# Patient Record
Sex: Male | Born: 1975 | Race: White | Hispanic: No | Marital: Married | Smoking: Former smoker
Health system: Southern US, Community
[De-identification: ages and names within clinical notes are randomized; demographics above are authoritative.]

## PROBLEM LIST (undated history)

## (undated) DIAGNOSIS — I10 Essential (primary) hypertension: Secondary | ICD-10-CM

## (undated) DIAGNOSIS — C8590 Non-Hodgkin lymphoma, unspecified, unspecified site: Secondary | ICD-10-CM

## (undated) DIAGNOSIS — F1410 Cocaine abuse, uncomplicated: Secondary | ICD-10-CM

## (undated) DIAGNOSIS — E039 Hypothyroidism, unspecified: Secondary | ICD-10-CM

## (undated) DIAGNOSIS — G8929 Other chronic pain: Secondary | ICD-10-CM

## (undated) DIAGNOSIS — J42 Unspecified chronic bronchitis: Secondary | ICD-10-CM

## (undated) DIAGNOSIS — G4730 Sleep apnea, unspecified: Secondary | ICD-10-CM

## (undated) DIAGNOSIS — R519 Headache, unspecified: Secondary | ICD-10-CM

## (undated) DIAGNOSIS — M545 Low back pain, unspecified: Secondary | ICD-10-CM

## (undated) DIAGNOSIS — I509 Heart failure, unspecified: Secondary | ICD-10-CM

## (undated) DIAGNOSIS — J189 Pneumonia, unspecified organism: Secondary | ICD-10-CM

## (undated) DIAGNOSIS — R51 Headache: Secondary | ICD-10-CM

## (undated) DIAGNOSIS — J45909 Unspecified asthma, uncomplicated: Secondary | ICD-10-CM

## (undated) DIAGNOSIS — Z9289 Personal history of other medical treatment: Secondary | ICD-10-CM

## (undated) DIAGNOSIS — J449 Chronic obstructive pulmonary disease, unspecified: Secondary | ICD-10-CM

## (undated) DIAGNOSIS — N179 Acute kidney failure, unspecified: Secondary | ICD-10-CM

## (undated) DIAGNOSIS — K219 Gastro-esophageal reflux disease without esophagitis: Secondary | ICD-10-CM

## (undated) HISTORY — PX: WISDOM TOOTH EXTRACTION: SHX21

## (undated) HISTORY — DX: Acute kidney failure, unspecified: N17.9

## (undated) HISTORY — DX: Chronic obstructive pulmonary disease, unspecified: J44.9

## (undated) HISTORY — PX: HERNIA REPAIR: SHX51

## (undated) HISTORY — DX: Sleep apnea, unspecified: G47.30

---

## 2002-03-24 DIAGNOSIS — C8590 Non-Hodgkin lymphoma, unspecified, unspecified site: Secondary | ICD-10-CM

## 2002-03-24 HISTORY — DX: Non-Hodgkin lymphoma, unspecified, unspecified site: C85.90

## 2002-03-24 HISTORY — PX: OTHER SURGICAL HISTORY: SHX169

## 2015-10-08 ENCOUNTER — Encounter (HOSPITAL_COMMUNITY): Payer: Self-pay | Admitting: Emergency Medicine

## 2015-10-08 ENCOUNTER — Emergency Department (HOSPITAL_COMMUNITY): Payer: Self-pay

## 2015-10-08 ENCOUNTER — Inpatient Hospital Stay (HOSPITAL_COMMUNITY)
Admission: EM | Admit: 2015-10-08 | Discharge: 2015-10-13 | DRG: 193 | Disposition: A | Discharge status: Home / Self Care | Payer: Self-pay | Attending: Internal Medicine | Admitting: Internal Medicine

## 2015-10-08 DIAGNOSIS — J189 Pneumonia, unspecified organism: Principal | ICD-10-CM | POA: Diagnosis present

## 2015-10-08 DIAGNOSIS — K219 Gastro-esophageal reflux disease without esophagitis: Secondary | ICD-10-CM | POA: Diagnosis present

## 2015-10-08 DIAGNOSIS — J45901 Unspecified asthma with (acute) exacerbation: Secondary | ICD-10-CM | POA: Diagnosis present

## 2015-10-08 DIAGNOSIS — R079 Chest pain, unspecified: Secondary | ICD-10-CM

## 2015-10-08 DIAGNOSIS — Z8571 Personal history of Hodgkin lymphoma: Secondary | ICD-10-CM

## 2015-10-08 DIAGNOSIS — I10 Essential (primary) hypertension: Secondary | ICD-10-CM | POA: Diagnosis present

## 2015-10-08 DIAGNOSIS — Z801 Family history of malignant neoplasm of trachea, bronchus and lung: Secondary | ICD-10-CM

## 2015-10-08 DIAGNOSIS — R0902 Hypoxemia: Secondary | ICD-10-CM

## 2015-10-08 DIAGNOSIS — R9389 Abnormal findings on diagnostic imaging of other specified body structures: Secondary | ICD-10-CM | POA: Diagnosis present

## 2015-10-08 DIAGNOSIS — E669 Obesity, unspecified: Secondary | ICD-10-CM | POA: Diagnosis present

## 2015-10-08 DIAGNOSIS — J9601 Acute respiratory failure with hypoxia: Secondary | ICD-10-CM | POA: Diagnosis present

## 2015-10-08 DIAGNOSIS — Z923 Personal history of irradiation: Secondary | ICD-10-CM

## 2015-10-08 DIAGNOSIS — Z6836 Body mass index (BMI) 36.0-36.9, adult: Secondary | ICD-10-CM

## 2015-10-08 DIAGNOSIS — J690 Pneumonitis due to inhalation of food and vomit: Secondary | ICD-10-CM | POA: Diagnosis present

## 2015-10-08 HISTORY — DX: Unspecified asthma, uncomplicated: J45.909

## 2015-10-08 HISTORY — DX: Essential (primary) hypertension: I10

## 2015-10-08 MED ORDER — ALBUTEROL SULFATE (2.5 MG/3ML) 0.083% IN NEBU
5.0000 mg | INHALATION_SOLUTION | Freq: Once | RESPIRATORY_TRACT | Status: AC
  Administered 2015-10-08: 5 mg via RESPIRATORY_TRACT
  Filled 2015-10-08: qty 6

## 2015-10-08 NOTE — ED Notes (Signed)
Pt states that he has had SOB and trouble breathing tonight that is not relieved by inhaler. States he has had sinus congestion but worsened tonight. Alert and oriented. 84% RA.

## 2015-10-09 ENCOUNTER — Emergency Department (HOSPITAL_COMMUNITY): Payer: Self-pay

## 2015-10-09 ENCOUNTER — Encounter (HOSPITAL_COMMUNITY): Payer: Self-pay | Admitting: Nurse Practitioner

## 2015-10-09 DIAGNOSIS — J45909 Unspecified asthma, uncomplicated: Secondary | ICD-10-CM

## 2015-10-09 DIAGNOSIS — R9389 Abnormal findings on diagnostic imaging of other specified body structures: Secondary | ICD-10-CM | POA: Diagnosis present

## 2015-10-09 DIAGNOSIS — J690 Pneumonitis due to inhalation of food and vomit: Secondary | ICD-10-CM | POA: Diagnosis present

## 2015-10-09 DIAGNOSIS — J189 Pneumonia, unspecified organism: Secondary | ICD-10-CM | POA: Diagnosis present

## 2015-10-09 DIAGNOSIS — I10 Essential (primary) hypertension: Secondary | ICD-10-CM

## 2015-10-09 DIAGNOSIS — J45901 Unspecified asthma with (acute) exacerbation: Secondary | ICD-10-CM | POA: Insufficient documentation

## 2015-10-09 DIAGNOSIS — J441 Chronic obstructive pulmonary disease with (acute) exacerbation: Secondary | ICD-10-CM | POA: Insufficient documentation

## 2015-10-09 LAB — CBC WITH DIFFERENTIAL/PLATELET
Basophils Absolute: 0 10*3/uL (ref 0.0–0.1)
Basophils Relative: 0 %
Eosinophils Absolute: 0 10*3/uL (ref 0.0–0.7)
Eosinophils Relative: 0 %
HEMATOCRIT: 39.9 % (ref 39.0–52.0)
HEMOGLOBIN: 12.8 g/dL — AB (ref 13.0–17.0)
LYMPHS ABS: 1.4 10*3/uL (ref 0.7–4.0)
Lymphocytes Relative: 18 %
MCH: 27.7 pg (ref 26.0–34.0)
MCHC: 32.1 g/dL (ref 30.0–36.0)
MCV: 86.4 fL (ref 78.0–100.0)
MONOS PCT: 5 %
Monocytes Absolute: 0.4 10*3/uL (ref 0.1–1.0)
NEUTROS ABS: 6 10*3/uL (ref 1.7–7.7)
NEUTROS PCT: 77 %
Platelets: 335 10*3/uL (ref 150–400)
RBC: 4.62 MIL/uL (ref 4.22–5.81)
RDW: 15.7 % — ABNORMAL HIGH (ref 11.5–15.5)
WBC: 7.8 10*3/uL (ref 4.0–10.5)

## 2015-10-09 LAB — ETHANOL

## 2015-10-09 LAB — BASIC METABOLIC PANEL
Anion gap: 9 (ref 5–15)
BUN: 18 mg/dL (ref 6–20)
CHLORIDE: 105 mmol/L (ref 101–111)
CO2: 26 mmol/L (ref 22–32)
Calcium: 9.5 mg/dL (ref 8.9–10.3)
Creatinine, Ser: 0.97 mg/dL (ref 0.61–1.24)
GFR calc Af Amer: >60 mL/min (ref >60)
GFR calc non Af Amer: >60 mL/min (ref >60)
GLUCOSE: 152 mg/dL — AB (ref 65–99)
POTASSIUM: 3.5 mmol/L (ref 3.5–5.1)
Sodium: 140 mmol/L (ref 135–145)

## 2015-10-09 LAB — TROPONIN I: Troponin I: <0.03 ng/mL (ref <0.031)

## 2015-10-09 LAB — LACTATE DEHYDROGENASE: LDH: 192 U/L (ref 98–192)

## 2015-10-09 LAB — PROCALCITONIN

## 2015-10-09 MED ORDER — DEXTROSE 5 % IV SOLN
1.0000 g | INTRAVENOUS | Status: DC
  Administered 2015-10-09 – 2015-10-13 (×5): 1 g via INTRAVENOUS
  Filled 2015-10-09 (×5): qty 10

## 2015-10-09 MED ORDER — HYDROCODONE-ACETAMINOPHEN 5-325 MG PO TABS
1.0000 | ORAL_TABLET | ORAL | Status: DC | PRN
  Administered 2015-10-09 – 2015-10-13 (×7): 2 via ORAL
  Filled 2015-10-09 (×2): qty 2
  Filled 2015-10-09: qty 1
  Filled 2015-10-09: qty 2
  Filled 2015-10-09: qty 1
  Filled 2015-10-09 (×3): qty 2

## 2015-10-09 MED ORDER — GABAPENTIN 400 MG PO CAPS
800.0000 mg | ORAL_CAPSULE | Freq: Three times a day (TID) | ORAL | Status: DC
  Administered 2015-10-09 – 2015-10-13 (×12): 800 mg via ORAL
  Filled 2015-10-09 (×13): qty 2

## 2015-10-09 MED ORDER — DEXTROSE 5 % IV SOLN
500.0000 mg | INTRAVENOUS | Status: DC
  Administered 2015-10-09 – 2015-10-13 (×5): 500 mg via INTRAVENOUS
  Filled 2015-10-09 (×5): qty 500

## 2015-10-09 MED ORDER — IPRATROPIUM-ALBUTEROL 0.5-2.5 (3) MG/3ML IN SOLN: 3.0000 mL | RESPIRATORY_TRACT | Status: DC | PRN

## 2015-10-09 MED ORDER — IPRATROPIUM BROMIDE 0.02 % IN SOLN
0.5000 mg | Freq: Once | RESPIRATORY_TRACT | Status: AC
  Administered 2015-10-09: 0.5 mg via RESPIRATORY_TRACT
  Filled 2015-10-09: qty 2.5

## 2015-10-09 MED ORDER — ALBUTEROL SULFATE (2.5 MG/3ML) 0.083% IN NEBU
5.0000 mg | INHALATION_SOLUTION | Freq: Once | RESPIRATORY_TRACT | Status: AC
  Administered 2015-10-09: 5 mg via RESPIRATORY_TRACT
  Filled 2015-10-09: qty 6

## 2015-10-09 MED ORDER — AMOXICILLIN 500 MG PO CAPS
1000.0000 mg | ORAL_CAPSULE | Freq: Once | ORAL | Status: AC
  Administered 2015-10-09: 1000 mg via ORAL
  Filled 2015-10-09: qty 2

## 2015-10-09 MED ORDER — AMLODIPINE BESYLATE 10 MG PO TABS
10.0000 mg | ORAL_TABLET | Freq: Every day | ORAL | Status: DC
  Administered 2015-10-09 – 2015-10-13 (×5): 10 mg via ORAL
  Filled 2015-10-09 (×6): qty 1

## 2015-10-09 MED ORDER — PREDNISONE 20 MG PO TABS
60.0000 mg | ORAL_TABLET | Freq: Once | ORAL | Status: AC
Start: 2015-10-09 — End: 2015-10-09
  Administered 2015-10-09: 60 mg via ORAL
  Filled 2015-10-09: qty 3

## 2015-10-09 MED ORDER — ALBUTEROL SULFATE (2.5 MG/3ML) 0.083% IN NEBU
2.5000 mg | INHALATION_SOLUTION | Freq: Three times a day (TID) | RESPIRATORY_TRACT | Status: DC
  Administered 2015-10-09 – 2015-10-11 (×6): 2.5 mg via RESPIRATORY_TRACT
  Filled 2015-10-09 (×8): qty 3

## 2015-10-09 MED ORDER — GUAIFENESIN ER 600 MG PO TB12
600.0000 mg | ORAL_TABLET | Freq: Two times a day (BID) | ORAL | Status: DC
Start: 2015-10-09 — End: 2015-10-13
  Administered 2015-10-09 – 2015-10-13 (×9): 600 mg via ORAL
  Filled 2015-10-09 (×9): qty 1

## 2015-10-09 MED ORDER — ACETYLCYSTEINE 20 % IN SOLN
3.0000 mL | Freq: Three times a day (TID) | RESPIRATORY_TRACT | Status: DC
  Administered 2015-10-09: 3 mL via RESPIRATORY_TRACT
  Filled 2015-10-09 (×4): qty 4

## 2015-10-09 MED ORDER — METHYLPREDNISOLONE SODIUM SUCC 40 MG IJ SOLR
40.0000 mg | Freq: Two times a day (BID) | INTRAMUSCULAR | Status: AC
  Administered 2015-10-09 – 2015-10-10 (×3): 40 mg via INTRAVENOUS
  Filled 2015-10-09 (×4): qty 1

## 2015-10-09 MED ORDER — ALBUTEROL SULFATE (2.5 MG/3ML) 0.083% IN NEBU: 5.0000 mg | INHALATION_SOLUTION | RESPIRATORY_TRACT | Status: DC | PRN

## 2015-10-09 MED ORDER — IPRATROPIUM-ALBUTEROL 0.5-2.5 (3) MG/3ML IN SOLN
3.0000 mL | RESPIRATORY_TRACT | Status: DC | PRN
Start: 2015-10-09 — End: 2015-10-09

## 2015-10-09 MED ORDER — PANTOPRAZOLE SODIUM 40 MG PO TBEC
40.0000 mg | DELAYED_RELEASE_TABLET | Freq: Every day | ORAL | Status: DC
  Administered 2015-10-09 – 2015-10-13 (×5): 40 mg via ORAL
  Filled 2015-10-09 (×5): qty 1

## 2015-10-09 MED ORDER — PREDNISONE 50 MG PO TABS
60.0000 mg | ORAL_TABLET | Freq: Every day | ORAL | Status: DC
  Filled 2015-10-09: qty 1
  Filled 2015-10-09: qty 3

## 2015-10-09 MED ORDER — BUDESONIDE 0.5 MG/2ML IN SUSP
0.5000 mg | Freq: Two times a day (BID) | RESPIRATORY_TRACT | Status: DC
  Administered 2015-10-09 – 2015-10-11 (×4): 0.5 mg via RESPIRATORY_TRACT
  Filled 2015-10-09 (×6): qty 2

## 2015-10-09 MED ORDER — ENOXAPARIN SODIUM 40 MG/0.4ML ~~LOC~~ SOLN
40.0000 mg | SUBCUTANEOUS | Status: DC
  Administered 2015-10-09 – 2015-10-12 (×3): 40 mg via SUBCUTANEOUS
  Filled 2015-10-09 (×5): qty 0.4

## 2015-10-09 MED ORDER — HYDROCOD POLST-CPM POLST ER 10-8 MG/5ML PO SUER
5.0000 mL | Freq: Once | ORAL | Status: AC
  Administered 2015-10-09: 5 mL via ORAL
  Filled 2015-10-09: qty 5

## 2015-10-09 MED ORDER — SODIUM CHLORIDE 0.9 % IV BOLUS (SEPSIS)
1000.0000 mL | Freq: Once | INTRAVENOUS | Status: AC
  Administered 2015-10-09: 1000 mL via INTRAVENOUS

## 2015-10-09 NOTE — Progress Notes (Signed)
I have and examined [name] at bedside and reviewed his chart. Also consulted pulmonary. Appreciate help. Mkai Ferryman was admitted earlier this morning by Dr Tamala Julian. Please refer to his comprehensive H&P for details. Patient has left upper lobe pneumonia with question of left upper lobe collapse in setting of history of lymphoma in remission and recent incarceration. Question is whether he has a postobstructive process. Will follow pulmonary recommendations.

## 2015-10-09 NOTE — Consult Note (Signed)
Name: Yohan Neil MRN: [id number] DOB: November 18, 1975    ADMISSION DATE:  10/08/2015 CONSULTATION DATE:  10/09/15  REFERRING MD :  Sanjuana Letters  CHIEF COMPLAINT:  SOB   HISTORY OF PRESENT ILLNESS:  Angel Diagne is a 40 y.o. male with a PMH of asthma, HTN, GERD (feels controlled on medication), prior mediastinoscopy for diagnosis of Non-Hodgkins lymphoma (recurrence x2 - 2002-2005, 2013-2014, followed at [facility] previously by Dr. Yancey Flemings) in remission who presented to the  [facility] ED 02/15 for SOB that started 5 - 7 days prior.  Symptoms began with nasal congestion then dry cough and progressed into SOB with chest tightness.  He used inhalers but got no relief.  Symptoms exacerbation with exertion, relieved with rest.  Also reported subjective fevers, chills, and malaise.  Denied any headaches, true chest pain (besides chest tightness), N/V/D, abd pain, myalgias.  He is a former smoker - smoked approx 15 years, 1ppd and quit 10 years agot.  The patient was  released from prison 2 mos ago after a 10 year stay.  He had negative PPD, HIV tests prior to release.  He has been working while in prison and has continued to work post release.  Unfortunately, his insurance will not kick in for another month.  He denies ETOH, current smoking, illegal drugs.  Of note, he does report ongoing sinus issues, sinus pressure, post nasal gtt and wakes with a sore throat in am.   He denies any unintentional weight loss, hemoptysis & night sweats. Has not noticed any lymphadenopathy.   In ED, he was hypoxic to 84% on RA initially.  This improved to 93% on RA after 2 breathing treatments; however, he desaturated to 88% with ambulation.  CXR showed LUL atx vs scarring with possible underlying PNA.  CT scan suggestive of collapsed LUL with consolidation (obstructing lesion not ruled out).  PCCM was called for further recs.    PAST MEDICAL HISTORY :   has a past medical history of Lymphoma ([place]); Asthma; and Hypertension.  has past  surgical history that includes tumor biopsy and Wisdom tooth extraction.   Prior to Admission medications   Medication Sig Start Date End Date Taking? Authorizing Provider  amLODipine (NORVASC) 10 MG tablet Take 10 mg by mouth daily.   Yes Historical Provider, MD  gabapentin (NEURONTIN) 800 MG tablet Take 800 mg by mouth 3 (three) times daily.   Yes Historical Provider, MD  pantoprazole (PROTONIX) 40 MG tablet Take 40 mg by mouth daily.   Yes Historical Provider, MD  Phenylephrine-APAP-Guaifenesin (SINUS CONGESTION/PAIN SEVERE PO) Take 1 tablet by mouth every 6 (six) hours as needed (congestion).   Yes Historical Provider, MD  pseudoephedrine-guaifenesin (MUCINEX D) 60-600 MG 12 hr tablet Take 1 tablet by mouth every 12 (twelve) hours as needed for congestion.   Yes Historical Provider, MD   No Known Allergies  FAMILY HISTORY:  family history is not on file.  Parents are deceased. Father was killed in a MVA. Mother had lung CA.   SOCIAL HISTORY:  reports that he has never smoked. His smokeless tobacco use includes Snuff. He reports that he does not drink alcohol or use illicit drugs.  Engaged. Has 5 children. Used to work in Education officer, environmental. Works at [employer]. Denies smoking, ETOH, illicit drug use.  REVIEW OF SYSTEMS:   Gen: Denies weight change, fatigue, night sweats.  Reports subjective fevers / chills HEENT: Denies blurred vision, double vision, hearing loss, tinnitus, sinus congestion, rhinorrhea, sore throat, neck stiffness, dysphagia PULM:  Denies hemoptysis.  Reports shortness of breath, cough, occasional green sputum production, chest tightness and wheezing CV: Denies chest pain, edema, orthopnea, paroxysmal nocturnal dyspnea, palpitations GI: Denies abdominal pain, nausea, vomiting, diarrhea, hematochezia, melena, constipation, change in bowel habits GU: Denies dysuria, hematuria, polyuria, oliguria, urethral discharge Endocrine: Denies hot or cold intolerance, polyuria,  polyphagia or appetite change Derm: Denies rash, dry skin, scaling or peeling skin change Heme: Denies easy bruising, bleeding, bleeding gums Neuro: Denies headache, numbness, weakness, slurred speech, loss of memory or consciousness    SUBJECTIVE:   VITAL SIGNS: Temp:  [97.5 F (36.4 C)-97.9 F (36.6 C)] 97.9 F (36.6 C) (02/15 0152) Pulse Rate:  [77-120] 120 (02/15 1033) Resp:  [16-23] 21 (02/15 1033) BP: (135-168)/(76-115) 145/76 mmHg (02/15 1033) SpO2:  [84 %-96 %] 95 % (02/15 1033) Weight:  [122.471 kg (270 lb)] 122.471 kg (270 lb) (02/14 2207)  PHYSICAL EXAMINATION: General: WDWN adult male in NAD, sitting at bedside  Neuro: AAOx4, speech clear, MAE HEENT: MM pink/moist, no jvd, old mediastinoscopy scar  Cardiovascular: s1s2 rrr, no m/r/g  Lungs: even/non-labored, lungs bilaterally with few soft wheeze Abdomen: obese/soft, bsx4 active  Musculoskeletal: no acute deformities  Skin: warm/dry, no edema  Lymph:  No palpable lymph nodes in neck, chest or axilla     Recent Labs Lab 10/09/15 0445  NA 140  K 3.5  CL 105  CO2 26  BUN 18  CREATININE 0.97  GLUCOSE 152*    Recent Labs Lab 10/09/15 0445  HGB 12.8*  HCT 39.9  WBC 7.8  PLT 335   Dg Chest 2 View  10/08/2015  CLINICAL DATA:  Body ache, shortness of breath, fever and cough for 1 week. History of lymphoma, asthma and hypertension. EXAM: CHEST  2 VIEW COMPARISON:  None. FINDINGS: LEFT upper lobe atelectasis with air bronchograms. Strandy densities LEFT lung base. RIGHT lung is clear. No pleural effusion. No pneumothorax. Air distended proximal esophagus. Cardiac silhouette is normal. Soft tissue planes and included osseous structures are nonsuspicious. IMPRESSION: Apparent LEFT upper lobe atelectasis versus scarring, with superimposed possible pneumonia. Recommend CT chest to assess for endobronchial lesion. LEFT lung base atelectasis/scarring. Electronically Signed   By: Elon Alas M.D.   On: 10/08/2015  23:18   Ct Chest Wo Contrast  10/09/2015  CLINICAL DATA:  Shortness of breath tonight. Increased congestion. Low oxygen saturation. Possible endobronchial lesion on chest radiograph. History of hypertension and lymphoma. EXAM: CT CHEST WITHOUT CONTRAST TECHNIQUE: Multidetector CT imaging of the chest was performed following the standard protocol without IV contrast. COMPARISON:  Chest radiograph 10/08/2015 FINDINGS: Volume loss and consolidation in the left upper lung with air bronchograms. Left upper lung bronchus is patent but appears small. Changes are likely due to pneumonia but occult obstructing lesion is not excluded. Recommend follow-up imaging after resolution of acute process for further evaluation. Right lung is clear. Airways are patent. Normal heart size. Normal caliber thoracic aorta. No significant lymphadenopathy in the chest. Esophagus is decompressed. Included portions of the upper abdominal organs demonstrate a tiny low-attenuation lesion in the liver probably representing a cyst although too small to characterize. No destructive bone lesions. Calcification in the anterior mediastinum is of nonspecific etiology but appears benign. IMPRESSION: Collapse and consolidation of the left upper lung. This is likely to represent pneumonia but occult obstructing lesion is not excluded. Recommend followup after resolution of acute process. No mediastinal lymphadenopathy. Electronically Signed   By: Lucienne Capers M.D.   On: 10/09/2015 05:57  STUDIES:  CXR 02/15 > LUL atx vs scarring with possible superimposed PNA. CT chest 02/15 > collapse and consolidation of LUL, likely due to PNA but obstructing lesion not excluded.  SIGNIFICANT EVENTS  02/15 > admitted with SOB and probable CAP.  ASSESSMENT / PLAN:  Presumed CAP - CT with clear consolidation, volume loss, atx in LUL with noteable air bronchograms.  Favor PNA vs obstructing lesion, doubt TB but for completeness with hx   Plan: Agree  with CAP coverage (azithro / rocephin). Follow cultures.  Assess PCT. Pulmonary hygiene. Defer bronch for now. Add mucomyst TID with albuterol  Assess urine strep antigen  Assess quantiferon gold Follow CXR.   Likely will need repeat CT imaging in near future PPI QD Assess AFB x3 Assess UDS, ETOH Pulmicort BID    Acute hypoxic respiratory failure - due to CAP + possible asthma exac. Asthma with possible exacerbation. Former Tobacco Abuse   Plan: Continue supplemental O2 as needed to maintain SpO2 > 92%. Consider ambulatory desat study prior to d/c to assess home O2 needs. Continue BD's, steroids.  Rest per primary team.    Noe Gens, NP-C [facility] Pgr: [phone] or if no answer [phone] 10/09/2015, 1:17 PM   ATTENDING NOTE: I  have personally reviewed patient's available data, including medical history, events of note, physical examination and test results as part of my evaluation. I have discussed with NP Noe Gens.  I agree with the physical examination, assessment, and plan as outlined. Additional physical examination/comments/assessment/plan as written below.  Pt is a 66M, with history of asthma, Hodgkins lymphoma (in remission), recently released from incarceration, presents to ED with 2 week h/o cough, sputum production,fevers, chills, wheezing, SOB.  CXR with LUL infiltrate and atelectasis but can not R/O scarring from previous Radiation he got from Hodgkins Lymphoma.  Pt was CAP and asthma exacerbation. LUL infiltrate/atelectasis could be from radiation as well. Doubt PTB.   Plan: 1. As written by Noe Gens. 2. More importantly, we need to get records (Chest ct scan -- he had yearly until 2016) from [facility]. Will ask case manager to retrieve records.  3. Will switch to IV medrol for his asthma axacerbation. May switch to PO pred in 2-3 days if better. 4. As TB GOLD quantiferon has been ordered, I think it is imperative to place pt on TB  isolation until TB is ruled out.  5. Cont other meds.  6. DVT prophylaxis.  7. May need Symbicort or Dulera or Advair on D/C.    Elsie Saas A. Corrie Dandy, MD 10/09/2015, 5:39 PM [facility] Pager [phone] After 3 pm or if no answer, call [phone]

## 2015-10-09 NOTE — ED Notes (Signed)
Pt dropped to 88% ambulating from 96% on room air.

## 2015-10-09 NOTE — ED Provider Notes (Signed)
CSN: [id number]     Arrival date & time 10/08/15  2155 History   First MD Initiated Contact with Patient 10/09/15 0112     Chief Complaint  Patient presents with  . Shortness of Breath     (Consider location/radiation/quality/duration/timing/severity/associated sxs/prior Treatment) Patient is a 40 y.o. male presenting with shortness of breath. The history is provided by the patient and the spouse. No language interpreter was used.  Shortness of Breath Severity:  Severe Onset quality:  Gradual Duration:  1 day Timing:  Constant Progression:  Worsening Associated symptoms: cough and wheezing   Associated symptoms: no abdominal pain, no chest pain, no headaches, no neck pain and no rash   Associated symptoms comment:  Patient presents with complaint of sinus/nasal congestion for several days, dry cough, sore throat and swollen lymph nodes. He reports chills and sweating at home but did not take his temperature. No nausea or vomiting. He reports history of asthma with inhaler use at home but no relief with inhaler yesterday. He has chest tightness and SOB but no chest pain. He reports history of lymphoma in remission.   Past Medical History  Diagnosis Date  . Lymphoma ([place])     x 2  . Asthma   . Hypertension    No past surgical history on file. No family history on file. Social History  Substance Use Topics  . Smoking status: Never Smoker   . Smokeless tobacco: Not on file  . Alcohol Use: No    Review of Systems  Constitutional: Positive for chills.  HENT: Positive for congestion and trouble swallowing. Negative for voice change.   Respiratory: Positive for cough, chest tightness, shortness of breath and wheezing.   Cardiovascular: Negative for chest pain.  Gastrointestinal: Negative for nausea and abdominal pain.  Musculoskeletal: Negative for myalgias and neck pain.  Skin: Negative for rash.  Neurological: Negative for syncope, weakness and headaches.      Allergies   Review of patient's allergies indicates no known allergies.  Home Medications   Prior to Admission medications   Medication Sig Start Date End Date Taking? Authorizing Provider  amLODipine (NORVASC) 10 MG tablet Take 10 mg by mouth daily.   Yes Historical Provider, MD  gabapentin (NEURONTIN) 800 MG tablet Take 800 mg by mouth 3 (three) times daily.   Yes Historical Provider, MD  pantoprazole (PROTONIX) 40 MG tablet Take 40 mg by mouth daily.   Yes Historical Provider, MD  Phenylephrine-APAP-Guaifenesin (SINUS CONGESTION/PAIN SEVERE PO) Take 1 tablet by mouth every 6 (six) hours as needed (congestion).   Yes Historical Provider, MD  pseudoephedrine-guaifenesin (MUCINEX D) 60-600 MG 12 hr tablet Take 1 tablet by mouth every 12 (twelve) hours as needed for congestion.   Yes Historical Provider, MD   BP 168/101 mmHg  Pulse 118  Temp(Src) 97.5 F (36.4 C) (Oral)  Resp 22  Ht 6' (1.829 m)  Wt 122.471 kg  BMI 36.61 kg/m2  SpO2 90% Physical Exam  Constitutional: He appears well-developed and well-nourished. No distress.  HENT:  Head: Normocephalic.  Mouth/Throat: Uvula is midline and mucous membranes are normal. Posterior oropharyngeal erythema present. No oropharyngeal exudate or posterior oropharyngeal edema.  Eyes: Conjunctivae are normal.  Neck: Normal range of motion.  Cardiovascular: Regular rhythm.  Tachycardia present.   Pulmonary/Chest: He has wheezes.  Patient examined after one nebulizer treatment. Continued wheezing through inspiration and expiration with prolonged expiration.   Abdominal: Soft. There is no tenderness.  Musculoskeletal: Normal range of motion. He exhibits no  edema.  Lymphadenopathy:    He has cervical adenopathy.  Skin: Skin is warm and dry.    ED Course  Procedures (including critical care time) Labs Review Labs Reviewed - No data to display Results for orders placed or performed during the hospital encounter of 10/08/15  CBC with Differential   Result Value Ref Range   WBC 7.8 4.0 - 10.5 K/uL   RBC 4.62 4.22 - 5.81 MIL/uL   Hemoglobin 12.8 (L) 13.0 - 17.0 g/dL   HCT 39.9 39.0 - 52.0 %   MCV 86.4 78.0 - 100.0 fL   MCH 27.7 26.0 - 34.0 pg   MCHC 32.1 30.0 - 36.0 g/dL   RDW 15.7 (H) 11.5 - 15.5 %   Platelets 335 150 - 400 K/uL   Neutrophils Relative % 77 %   Neutro Abs 6.0 1.7 - 7.7 K/uL   Lymphocytes Relative 18 %   Lymphs Abs 1.4 0.7 - 4.0 K/uL   Monocytes Relative 5 %   Monocytes Absolute 0.4 0.1 - 1.0 K/uL   Eosinophils Relative 0 %   Eosinophils Absolute 0.0 0.0 - 0.7 K/uL   Basophils Relative 0 %   Basophils Absolute 0.0 0.0 - 0.1 K/uL  Basic metabolic panel  Result Value Ref Range   Sodium 140 135 - 145 mmol/L   Potassium 3.5 3.5 - 5.1 mmol/L   Chloride 105 101 - 111 mmol/L   CO2 26 22 - 32 mmol/L   Glucose, Bld 152 (H) 65 - 99 mg/dL   BUN 18 6 - 20 mg/dL   Creatinine, Ser 0.97 0.61 - 1.24 mg/dL   Calcium 9.5 8.9 - 10.3 mg/dL   GFR calc non Af Amer >60 >60 mL/min   GFR calc Af Amer >60 >60 mL/min   Anion gap 9 5 - 15   Dg Chest 2 View  10/08/2015  CLINICAL DATA:  Body ache, shortness of breath, fever and cough for 1 week. History of lymphoma, asthma and hypertension. EXAM: CHEST  2 VIEW COMPARISON:  None. FINDINGS: LEFT upper lobe atelectasis with air bronchograms. Strandy densities LEFT lung base. RIGHT lung is clear. No pleural effusion. No pneumothorax. Air distended proximal esophagus. Cardiac silhouette is normal. Soft tissue planes and included osseous structures are nonsuspicious. IMPRESSION: Apparent LEFT upper lobe atelectasis versus scarring, with superimposed possible pneumonia. Recommend CT chest to assess for endobronchial lesion. LEFT lung base atelectasis/scarring. Electronically Signed   By: Elon Alas M.D.   On: 10/08/2015 23:18   Ct Chest Wo Contrast  10/09/2015  CLINICAL DATA:  Shortness of breath tonight. Increased congestion. Low oxygen saturation. Possible endobronchial lesion on  chest radiograph. History of hypertension and lymphoma. EXAM: CT CHEST WITHOUT CONTRAST TECHNIQUE: Multidetector CT imaging of the chest was performed following the standard protocol without IV contrast. COMPARISON:  Chest radiograph 10/08/2015 FINDINGS: Volume loss and consolidation in the left upper lung with air bronchograms. Left upper lung bronchus is patent but appears small. Changes are likely due to pneumonia but occult obstructing lesion is not excluded. Recommend follow-up imaging after resolution of acute process for further evaluation. Right lung is clear. Airways are patent. Normal heart size. Normal caliber thoracic aorta. No significant lymphadenopathy in the chest. Esophagus is decompressed. Included portions of the upper abdominal organs demonstrate a tiny low-attenuation lesion in the liver probably representing a cyst although too small to characterize. No destructive bone lesions. Calcification in the anterior mediastinum is of nonspecific etiology but appears benign. IMPRESSION: Collapse and consolidation of the  left upper lung. This is likely to represent pneumonia but occult obstructing lesion is not excluded. Recommend followup after resolution of acute process. No mediastinal lymphadenopathy. Electronically Signed   By: Lucienne Capers M.D.   On: 10/09/2015 05:57    Imaging Review Dg Chest 2 View  10/08/2015  CLINICAL DATA:  Body ache, shortness of breath, fever and cough for 1 week. History of lymphoma, asthma and hypertension. EXAM: CHEST  2 VIEW COMPARISON:  None. FINDINGS: LEFT upper lobe atelectasis with air bronchograms. Strandy densities LEFT lung base. RIGHT lung is clear. No pleural effusion. No pneumothorax. Air distended proximal esophagus. Cardiac silhouette is normal. Soft tissue planes and included osseous structures are nonsuspicious. IMPRESSION: Apparent LEFT upper lobe atelectasis versus scarring, with superimposed possible pneumonia. Recommend CT chest to assess for  endobronchial lesion. LEFT lung base atelectasis/scarring. Electronically Signed   By: Elon Alas M.D.   On: 10/08/2015 23:18   I have personally reviewed and evaluated these images and lab results as part of my medical decision-making.   EKG Interpretation None      MDM   Final diagnoses:  None    1. Asthma exacerbation 2. Hypoxia 3. Abnormal chest xray  The patient makes mild progress in wheezing with multiple treatments. He feels "40%" improved. CXR showing PNA vs atx vs scarring. CT recommended by radiology to evaluate for possible endobronchial lesion. CT ordered.  The patient was noted to be hypoxic on arrival to 84%, improved with nebulizer to 93-95%. Over time, he receives 2 additional treatments with max O2 saturation of 93% and desaturates to 88% while ambulating. Prednisone provided. [facility] consulted for admission.    Charlann Lange, PA-C 10/09/15 [id number]  Everlene Balls, MD 10/09/15 [phone]

## 2015-10-09 NOTE — Progress Notes (Addendum)
Reviewed EPIC info  Pt without pcp or insurance Listed as medicaid potential in EPIC (?pt to be seen after 1st day of admission by financial counselor) Cm spoke with pt who confirms he is married and he and wife recently moved to the area "about two months ago"   CM discussed and provided written information for uninsured accepting pcps, discussed the importance of pcp vs EDP services for f/u care, www.needymeds.org, www.goodrx.com, discounted pharmacies and other [employer] such as [employer], affordable care act, financial assistance, uninsured dental services, [place] med assist, DSS and  health department  Reviewed resources for [employer] uninsured accepting pcps like [facility], family medicine at [employer], [facility] [place] [facility], [facility], local urgent care centers, [facility], [facility] family practice, general medical clinics, family services of the [place], [facility] urgent care plus others, medication resources, [facility] out patient pharmacies and housing Pt voiced understanding and appreciation of resources provided   Provided [facility] contact information Discussed generally after 3 months of [employer] residency he may contact [facility] for services Voiced understanding Reviewed use of goodrx to assist with uninsured cost of medicine Pt states he will give resources to wife to review Pt noted to flip through the pages with Cm as reviewed Pt without questions at this time    Entered in d/c instructions Please use the resources provided to you in emergency room by case manager to assist with doctor for follow up [facility] for community care network you may contact them after 3 months residency Call Sylvie Farrier at [place] [email or web address]/ These [place] uninsured resources provide possible primary care providers, resources for discounted medications, housing, dental resources, affordable care act information, plus other  resources for [facility]

## 2015-10-09 NOTE — ED Notes (Signed)
Pt can go up at 15:10

## 2015-10-09 NOTE — ED Notes (Signed)
Will update VS when pt is finished with meal tray

## 2015-10-09 NOTE — H&P (Addendum)
[facility] History and Physical  Leonard [name] [id number] DOB: February 12, 1976 DOA: 10/08/2015  Referring physician: ED PCP: No primary care provider on file.   Chief Complaint: Shortness of breath  HPI:  Leonard [name] is a 40 year old male with past medical history significant for asthma, hypertension, and lymphoma in remission; who presents with progressively worsening shortness of breath for last 5-7 days. Patient notes that he had developed shortness of breath after nasal congestion with a dry cough. Reported abrupt worsening of shortness of breath last night in which he experienced increased tightness in his chest. He tried utilizing inhalers which had previously worked some to relieve symptoms, but provided no relief last night. Patient notes that symptoms were worsened with any movement. Patient reports subjective fever, malaise Last hospitalized with acute exacerbation over one year ago. He denies any history of smoking tobacco, recent antibiotics, or steroids. Upon admission to the emergency department patient was noted to be hypoxic on room air on admission of 84%. After 2 breathing treatments patient's O2 sats improved to 93% on room air, but would try down to 88% with ambulation. Initial chest x-ray showed the possibility of a left upper lobe scarring versus pneumonia. CT scan showed signs of a collapsed left upper lobe with signs of consolidation suggestive of pneumonia.    Review of Systems  Constitutional: Positive for fever and malaise/fatigue.  HENT: Negative for hearing loss and tinnitus.   Eyes: Negative for double vision and photophobia.  Respiratory: Positive for cough, shortness of breath and wheezing. Negative for sputum production.   Cardiovascular: Positive for chest pain. Negative for leg swelling.  Gastrointestinal: Negative for nausea, vomiting and abdominal pain.  Genitourinary: Negative for urgency and hematuria.  Musculoskeletal: Negative for falls and neck  pain.  Skin: Negative for itching and rash.  Neurological: Negative for focal weakness and seizures.  Endo/Heme/Allergies: Positive for environmental allergies. Does not bruise/bleed easily.  Psychiatric/Behavioral: Negative for substance abuse.        Past Medical History  Diagnosis Date  . Lymphoma ([place])     x 2  . Asthma   . Hypertension      History reviewed. No pertinent past surgical history.    Social History:  reports that he has never smoked. He does not have any smokeless tobacco history on file. He reports that he does not drink alcohol or use illicit drugs.   No Known Allergies  History reviewed. No pertinent family history.      Prior to Admission medications   Medication Sig Start Date End Date Taking? Authorizing Provider  amLODipine (NORVASC) 10 MG tablet Take 10 mg by mouth daily.   Yes Historical Provider, MD  gabapentin (NEURONTIN) 800 MG tablet Take 800 mg by mouth 3 (three) times daily.   Yes Historical Provider, MD  pantoprazole (PROTONIX) 40 MG tablet Take 40 mg by mouth daily.   Yes Historical Provider, MD  Phenylephrine-APAP-Guaifenesin (SINUS CONGESTION/PAIN SEVERE PO) Take 1 tablet by mouth every 6 (six) hours as needed (congestion).   Yes Historical Provider, MD  pseudoephedrine-guaifenesin (MUCINEX D) 60-600 MG 12 hr tablet Take 1 tablet by mouth every 12 (twelve) hours as needed for congestion.   Yes Historical Provider, MD     Physical Exam: Filed Vitals:   10/09/15 0305 10/09/15 0645 10/09/15 0700 10/09/15 0806  BP: 161/94 135/84 139/82 155/84  Pulse: 110 116 115 77  Temp:      TempSrc:    Other (Comment)  Resp: 20 18 16 23   Height:  Weight:      SpO2: 93% 94% 91% 96%     Constitutional: Vital signs reviewed. Patient is a sickly appearing, but nontoxic obese male  Head: Normocephalic and atraumatic  Ear: TM normal bilaterally  Mouth: no erythema or exudates, MMM  Eyes: PERRL, EOMI, conjunctivae normal, No scleral  icterus.  Neck: Supple, Trachea midline normal ROM, No JVD, mass, thyromegaly, or carotid bruit present.  Cardiovascular: Tachycardic Pulmonary/Chest: Positive bilateral wheezing and mildly tachypneic Abdominal: Soft. Non-tender, non-distended, bowel sounds are normal, no masses, organomegaly, or guarding present.  GU: no CVA tenderness Musculoskeletal: No joint deformities, erythema, or stiffness, ROM full and no nontender Ext: no edema and no cyanosis, pulses palpable bilaterally (DP and PT)  Hematology: no cervical, inginal, or axillary adenopathy.  Neurological: A&O x3, Strenght is normal and symmetric bilaterally, cranial nerve II-XII are grossly intact, no focal motor deficit, sensory intact to light touch bilaterally.  Skin: Warm, dry and intact. No rash, cyanosis, or clubbing.  Psychiatric: Normal mood and affect. speech and behavior is normal. Judgment and thought content normal. Cognition and memory are normal.      Data Review   Micro Results No results found for this or any previous visit (from the past 240 hour(s)).  Radiology Reports Dg Chest 2 View  10/08/2015  CLINICAL DATA:  Body ache, shortness of breath, fever and cough for 1 week. History of lymphoma, asthma and hypertension. EXAM: CHEST  2 VIEW COMPARISON:  None. FINDINGS: LEFT upper lobe atelectasis with air bronchograms. Strandy densities LEFT lung base. RIGHT lung is clear. No pleural effusion. No pneumothorax. Air distended proximal esophagus. Cardiac silhouette is normal. Soft tissue planes and included osseous structures are nonsuspicious. IMPRESSION: Apparent LEFT upper lobe atelectasis versus scarring, with superimposed possible pneumonia. Recommend CT chest to assess for endobronchial lesion. LEFT lung base atelectasis/scarring. Electronically Signed   By: Elon Alas M.D.   On: 10/08/2015 23:18   Ct Chest Wo Contrast  10/09/2015  CLINICAL DATA:  Shortness of breath tonight. Increased congestion. Low  oxygen saturation. Possible endobronchial lesion on chest radiograph. History of hypertension and lymphoma. EXAM: CT CHEST WITHOUT CONTRAST TECHNIQUE: Multidetector CT imaging of the chest was performed following the standard protocol without IV contrast. COMPARISON:  Chest radiograph 10/08/2015 FINDINGS: Volume loss and consolidation in the left upper lung with air bronchograms. Left upper lung bronchus is patent but appears small. Changes are likely due to pneumonia but occult obstructing lesion is not excluded. Recommend follow-up imaging after resolution of acute process for further evaluation. Right lung is clear. Airways are patent. Normal heart size. Normal caliber thoracic aorta. No significant lymphadenopathy in the chest. Esophagus is decompressed. Included portions of the upper abdominal organs demonstrate a tiny low-attenuation lesion in the liver probably representing a cyst although too small to characterize. No destructive bone lesions. Calcification in the anterior mediastinum is of nonspecific etiology but appears benign. IMPRESSION: Collapse and consolidation of the left upper lung. This is likely to represent pneumonia but occult obstructing lesion is not excluded. Recommend followup after resolution of acute process. No mediastinal lymphadenopathy. Electronically Signed   By: Lucienne Capers M.D.   On: 10/09/2015 05:57     CBC  Recent Labs Lab 10/09/15 0445  WBC 7.8  HGB 12.8*  HCT 39.9  PLT 335  MCV 86.4  MCH 27.7  MCHC 32.1  RDW 15.7*  LYMPHSABS 1.4  MONOABS 0.4  EOSABS 0.0  BASOSABS 0.0    Chemistries   Recent Labs Lab 10/09/15  0445  NA 140  K 3.5  CL 105  CO2 26  GLUCOSE 152*  BUN 18  CREATININE 0.97  CALCIUM 9.5   ------------------------------------------------------------------------------------------------------------------ estimated creatinine clearance is 141 mL/min (by C-G formula based on Cr of  0.97). ------------------------------------------------------------------------------------------------------------------ No results for input(s): HGBA1C in the last 72 hours. ------------------------------------------------------------------------------------------------------------------ No results for input(s): CHOL, HDL, LDLCALC, TRIG, CHOLHDL, LDLDIRECT in the last 72 hours. ------------------------------------------------------------------------------------------------------------------ No results for input(s): TSH, T4TOTAL, T3FREE, THYROIDAB in the last 72 hours.  Invalid input(s): FREET3 ------------------------------------------------------------------------------------------------------------------ No results for input(s): VITAMINB12, FOLATE, FERRITIN, TIBC, IRON, RETICCTPCT in the last 72 hours.  Coagulation profile No results for input(s): INR, PROTIME in the last 168 hours.  No results for input(s): DDIMER in the last 72 hours.  Cardiac Enzymes No results for input(s): CKMB, TROPONINI, MYOGLOBIN in the last 168 hours.  Invalid input(s): CK ------------------------------------------------------------------------------------------------------------------ Invalid input(s): POCBNP   CBG: No results for input(s): GLUCAP in the last 168 hours.     EKG: Independently reviewed. Sinus tachycardia with borderline QT  Assessment/Plan  Asthma exacerbation - Admit to MedSurg bed - DuoNeb q 6 hr and prn q 2 hr - po Prednisone -Check troponin 1   Acute Pneumonia: Patient found to have an abnormal chest x-ray for which CT scan of the chest was recommended. CT showing possibility of a postobstructive pneumonia. Patient has not recently been admitted to the hospital or on any antibiotics. - Antibiotics of Rocephin and azithromycin - Obtain sputum cultures - Patient may benefit from a pulmonary consult as a bronchoscopy may be warranted  Hyperglycemia: Patient's initial blood  glucose 152 on admission. Could be elevated secondary to acute stress, but given patient's history of obesity suspect possibility of underlying diabetes. - Check hemoglobin A1c  Essential hypertension -Continue amlodipine  GERD -Continue Protonix    Code Status:   full Family Communication: bedside Disposition Plan: admit   Total time spent 55 minutes.Greater than 50% of this time was spent in counseling, explanation of diagnosis, planning of further management, and coordination of care  [facility] Pager [phone]  If 7PM-7AM, please contact night-coverage [email or web address] Password [facility] 10/09/2015, 8:52 AM

## 2015-10-09 NOTE — ED Notes (Signed)
NP at bedside.

## 2015-10-10 DIAGNOSIS — R938 Abnormal findings on diagnostic imaging of other specified body structures: Secondary | ICD-10-CM

## 2015-10-10 DIAGNOSIS — J45909 Unspecified asthma, uncomplicated: Secondary | ICD-10-CM

## 2015-10-10 DIAGNOSIS — J189 Pneumonia, unspecified organism: Principal | ICD-10-CM

## 2015-10-10 LAB — RAPID URINE DRUG SCREEN, HOSP PERFORMED
AMPHETAMINES: NOT DETECTED
BENZODIAZEPINES: NOT DETECTED
Barbiturates: NOT DETECTED
COCAINE: NOT DETECTED
OPIATES: POSITIVE — AB
TETRAHYDROCANNABINOL: NOT DETECTED

## 2015-10-10 LAB — CBC
HCT: 39.5 % (ref 39.0–52.0)
HEMOGLOBIN: 12.4 g/dL — AB (ref 13.0–17.0)
MCH: 27.2 pg (ref 26.0–34.0)
MCHC: 31.4 g/dL (ref 30.0–36.0)
MCV: 86.6 fL (ref 78.0–100.0)
PLATELETS: 373 10*3/uL (ref 150–400)
RBC: 4.56 MIL/uL (ref 4.22–5.81)
RDW: 15.7 % — AB (ref 11.5–15.5)
WBC: 10.3 10*3/uL (ref 4.0–10.5)

## 2015-10-10 LAB — BASIC METABOLIC PANEL
Anion gap: 10 (ref 5–15)
BUN: 14 mg/dL (ref 6–20)
CALCIUM: 9.3 mg/dL (ref 8.9–10.3)
CHLORIDE: 107 mmol/L (ref 101–111)
CO2: 21 mmol/L — ABNORMAL LOW (ref 22–32)
CREATININE: 0.98 mg/dL (ref 0.61–1.24)
GFR calc Af Amer: >60 mL/min (ref >60)
Glucose, Bld: 210 mg/dL — ABNORMAL HIGH (ref 65–99)
Potassium: 4.3 mmol/L (ref 3.5–5.1)
SODIUM: 138 mmol/L (ref 135–145)

## 2015-10-10 LAB — URINALYSIS, ROUTINE W REFLEX MICROSCOPIC
Bilirubin Urine: NEGATIVE
Glucose, UA: 100 mg/dL — AB
Hgb urine dipstick: NEGATIVE
Ketones, ur: NEGATIVE mg/dL
LEUKOCYTES UA: NEGATIVE
NITRITE: NEGATIVE
PH: 7 (ref 5.0–8.0)
Protein, ur: 100 mg/dL — AB
SPECIFIC GRAVITY, URINE: 1.024 (ref 1.005–1.030)

## 2015-10-10 LAB — URINE MICROSCOPIC-ADD ON

## 2015-10-10 LAB — HEMOGLOBIN A1C
Hgb A1c MFr Bld: 5.8 % — ABNORMAL HIGH (ref 4.8–5.6)
MEAN PLASMA GLUCOSE: 120 mg/dL

## 2015-10-10 LAB — STREP PNEUMONIAE URINARY ANTIGEN: Strep Pneumo Urinary Antigen: NEGATIVE

## 2015-10-10 LAB — HIV ANTIBODY (ROUTINE TESTING W REFLEX): HIV SCREEN 4TH GENERATION: NONREACTIVE

## 2015-10-10 MED ORDER — SODIUM CHLORIDE 3 % IN NEBU
5.0000 mL | INHALATION_SOLUTION | Freq: Three times a day (TID) | RESPIRATORY_TRACT | Status: AC
  Administered 2015-10-10 – 2015-10-11 (×3): 5 mL via RESPIRATORY_TRACT
  Administered 2015-10-11: 4 mL via RESPIRATORY_TRACT
  Filled 2015-10-10: qty 15
  Filled 2015-10-10 (×9): qty 8

## 2015-10-10 MED ORDER — PREDNISONE 20 MG PO TABS
40.0000 mg | ORAL_TABLET | Freq: Every day | ORAL | Status: DC
  Administered 2015-10-11: 40 mg via ORAL
  Filled 2015-10-10: qty 2

## 2015-10-10 MED ORDER — LISINOPRIL 10 MG PO TABS
5.0000 mg | ORAL_TABLET | Freq: Every day | ORAL | Status: DC
  Administered 2015-10-11 – 2015-10-13 (×3): 5 mg via ORAL
  Filled 2015-10-10 (×3): qty 1

## 2015-10-10 MED ORDER — MORPHINE SULFATE (PF) 2 MG/ML IV SOLN
2.0000 mg | Freq: Once | INTRAVENOUS | Status: AC
  Administered 2015-10-11: 2 mg via INTRAVENOUS
  Filled 2015-10-10: qty 1

## 2015-10-10 NOTE — Consult Note (Deleted)
Name: Leonard [name] MRN: [id number] DOB: [birth year]    ADMISSION DATE:  10/08/2015 CONSULTATION DATE:  10/09/15  REFERRING MD :  Sanjuana Letters  CHIEF COMPLAINT:  SOB   HISTORY OF PRESENT ILLNESS:  Leonard [name] is a 40 y.o. 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 Yancey Flemings) in remission who presented to the  [facility] ED 02/15 for SOB that started 5 - 7 days prior.  Symptoms began with nasal congestion then dry cough and progressed into SOB with chest tightness.  He used inhalers but got no relief.  Symptoms exacerbation with exertion, relieved with rest.  Also reported subjective fevers, chills, and malaise.  Denied any headaches, true chest pain (besides chest tightness), N/V/D, abd pain, myalgias.  He is a former smoker - smoked approx 15 years, 1ppd and quit 10 years agot.  The patient was  released from prison 2 mos ago after a 10 year stay.  He had negative PPD, HIV tests prior to release.  He has been working while in prison and has continued to work post release.  Unfortunately, his insurance will not kick in for another month.  He denies ETOH, current smoking, illegal drugs.  Of note, he does report ongoing sinus issues, sinus pressure, post nasal gtt and wakes with a sore throat in am.   He denies any unintentional weight loss, hemoptysis & night sweats. Has not noticed any lymphadenopathy.   In ED, he was hypoxic to 84% on RA initially.  This improved to 93% on RA after 2 breathing treatments; however, he desaturated to 88% with ambulation.  CXR showed LUL atx vs scarring with possible underlying PNA.  CT scan suggestive of collapsed LUL with consolidation (obstructing lesion not ruled out).  PCCM was called for further recs.    PAST MEDICAL HISTORY :   has a past medical history of Lymphoma ([place]); Asthma; and Hypertension.  has past  surgical history that includes tumor biopsy and Wisdom tooth extraction.   Prior to Admission medications   Medication Sig Start Date End Date Taking? Authorizing Provider  amLODipine (NORVASC) 10 MG tablet Take 10 mg by mouth daily.   Yes Historical Provider, MD  gabapentin (NEURONTIN) 800 MG tablet Take 800 mg by mouth 3 (three) times daily.   Yes Historical Provider, MD  pantoprazole (PROTONIX) 40 MG tablet Take 40 mg by mouth daily.   Yes Historical Provider, MD  Phenylephrine-APAP-Guaifenesin (SINUS CONGESTION/PAIN SEVERE PO) Take 1 tablet by mouth every 6 (six) hours as needed (congestion).   Yes Historical Provider, MD  pseudoephedrine-guaifenesin (MUCINEX D) 60-600 MG 12 hr tablet Take 1 tablet by mouth every 12 (twelve) hours as needed for congestion.   Yes Historical Provider, MD   No Known Allergies  FAMILY HISTORY:  family history is not on file.  Parents are deceased. Father was killed in a MVA. Mother had lung CA.   SOCIAL HISTORY:  reports that he has never smoked. His smokeless tobacco use includes Snuff. He reports that he does not drink alcohol or use illicit drugs.  Engaged. Has 5 children. Used to work in Education officer, environmental. Works at [employer]. Denies smoking, ETOH, illicit drug use.  REVIEW OF SYSTEMS:   Gen: Denies weight change, fatigue, night sweats.  Reports subjective fevers / chills HEENT: Denies blurred vision, double vision, hearing loss, tinnitus, sinus congestion, rhinorrhea, sore throat, neck stiffness, dysphagia PULM:  Denies hemoptysis.  Reports shortness of breath, cough, occasional green sputum production, chest tightness and wheezing CV: Denies chest pain, edema, orthopnea, paroxysmal nocturnal dyspnea, palpitations GI: Denies abdominal pain, nausea, vomiting, diarrhea, hematochezia, melena, constipation, change in bowel habits GU: Denies dysuria, hematuria, polyuria, oliguria, urethral discharge Endocrine: Denies hot or cold intolerance, polyuria,  polyphagia or appetite change Derm: Denies rash, dry skin, scaling or peeling skin change Heme: Denies easy bruising, bleeding, bleeding gums Neuro: Denies headache, numbness, weakness, slurred speech, loss of memory or consciousness    SUBJECTIVE:   VITAL SIGNS: Temp:  [97.6 F (36.4 C)-98.7 F (37.1 C)] 97.9 F (36.6 C) (02/16 0505) Pulse Rate:  [104-121] 104 (02/16 0505) Resp:  [18-20] 18 (02/16 0505) BP: (136-169)/(82-102) 136/82 mmHg (02/16 0505) SpO2:  [95 %-100 %] 99 % (02/16 0847) Weight:  [270 lb (122.471 kg)] 270 lb (122.471 kg) (02/15 1752)  PHYSICAL EXAMINATION: General: WDWN adult male in NAD, sitting at bedside  Neuro: AAOx4, speech clear, MAE HEENT: MM pink/moist, no jvd, old mediastinoscopy scar  Cardiovascular: s1s2 rrr, no m/r/g  Lungs: even/non-labored, lungs bilaterally with few soft wheeze Abdomen: obese/soft, bsx4 active  Musculoskeletal: no acute deformities  Skin: warm/dry, no edema  Lymph:  No palpable lymph nodes in neck, chest or axilla     Recent Labs Lab 10/09/15 0445 10/10/15 0343  NA 140 138  K 3.5 4.3  CL 105 107  CO2 26 21*  BUN 18 14  CREATININE 0.97 0.98  GLUCOSE 152* 210*    Recent Labs Lab 10/09/15 0445 10/10/15 0343  HGB 12.8* 12.4*  HCT 39.9 39.5  WBC 7.8 10.3  PLT 335 373   Dg Chest 2 View  10/08/2015  CLINICAL DATA:  Body ache, shortness of breath, fever and cough for 1 week. History of lymphoma, asthma and hypertension. EXAM: CHEST  2 VIEW COMPARISON:  None. FINDINGS: LEFT upper lobe atelectasis with air bronchograms. Strandy densities LEFT lung base. RIGHT lung is clear. No pleural effusion. No pneumothorax. Air distended proximal esophagus. Cardiac silhouette is normal. Soft tissue planes and included osseous structures are nonsuspicious. IMPRESSION: Apparent LEFT upper lobe atelectasis versus scarring, with superimposed possible pneumonia. Recommend CT chest to assess for endobronchial lesion. LEFT lung base  atelectasis/scarring. Electronically Signed   By: Elon Alas M.D.   On: 10/08/2015 23:18   Ct Chest Wo Contrast  10/09/2015  CLINICAL DATA:  Shortness of breath tonight. Increased congestion. Low oxygen saturation. Possible endobronchial lesion on chest radiograph. History of hypertension and lymphoma. EXAM: CT CHEST WITHOUT CONTRAST TECHNIQUE: Multidetector CT imaging of the chest was performed following the standard protocol without IV contrast. COMPARISON:  Chest radiograph 10/08/2015 FINDINGS: Volume loss and consolidation in the left upper lung with air bronchograms. Left upper lung bronchus is patent but appears small. Changes are likely due to pneumonia but occult obstructing lesion is not excluded. Recommend follow-up imaging after resolution of acute process for further evaluation. Right lung is clear. Airways are patent. Normal heart size. Normal caliber thoracic aorta. No significant lymphadenopathy in the chest. Esophagus is decompressed. Included portions of the upper abdominal organs demonstrate a tiny low-attenuation lesion in the liver probably representing a cyst although too small to characterize. No destructive bone lesions. Calcification in the anterior mediastinum is of nonspecific etiology but appears benign. IMPRESSION: Collapse and consolidation of the left upper lung. This is likely to represent pneumonia but occult obstructing lesion is not excluded. Recommend followup after resolution of acute process. No mediastinal lymphadenopathy. Electronically Signed  By: Lucienne Capers M.D.   On: 10/09/2015 05:57    STUDIES:  CXR 02/15 > LUL atx vs scarring with possible superimposed PNA. CT chest 02/15 > collapse and consolidation of LUL, likely due to PNA but obstructing lesion not excluded.  SIGNIFICANT EVENTS  02/15 > admitted with SOB and probable CAP.  ASSESSMENT / PLAN:  Presumed CAP - CT with clear consolidation, volume loss, atx in LUL with noteable air bronchograms.   Favor PNA vs obstructing lesion, doubt TB but for completeness with hx.  PCT negative.    Plan: Agree with CAP coverage (azithro / rocephin). Follow cultures.  Pulmonary hygiene. Defer bronch for now. Albuterol TID Await urine strep antigen  Await quantiferon gold Follow CXR intermittently   Likely will need repeat CT imaging in near future PPI QD (home medication) Assess AFB x3 Await UDS Pulmicort BID  Will need pulmonary follow up at discharge and Rx for advair.  He is hopeful to schedule f/u after he has insurance (1 month) [facility] obtain [facility]    Acute hypoxic respiratory failure - due to CAP + possible asthma exac. Asthma with possible exacerbation. Former Tobacco Abuse   Plan: Continue supplemental O2 as needed to maintain SpO2 > 92%. Consider ambulatory desat study prior to d/c to assess home O2 needs. Continue BD's Taper steroids to oral in am     Rest per primary team.  Noe Gens, NP-C [facility] Pgr: [phone] or if no answer [phone] 10/10/2015, 10:36 AM

## 2015-10-10 NOTE — Progress Notes (Signed)
Name: Leonard [name] MRN: [id number] DOB: 1976/01/15    ADMISSION DATE:  10/08/2015 CONSULTATION DATE:  10/09/15  REFERRING MD :  Sanjuana Letters  CHIEF COMPLAINT:  SOB     SUBJECTIVE: Pt reports feeling better, less SOB.  Difficult to produce sputum.    VITAL SIGNS: Temp:  [97.6 F (36.4 C)-98.7 F (37.1 C)] 97.9 F (36.6 C) (02/16 0505) Pulse Rate:  [104-121] 104 (02/16 0505) Resp:  [18-20] 18 (02/16 0505) BP: (136-169)/(82-102) 136/82 mmHg (02/16 0505) SpO2:  [95 %-100 %] 99 % (02/16 0847) Weight:  [270 lb (122.471 kg)] 270 lb (122.471 kg) (02/15 1752)  PHYSICAL EXAMINATION: General: WDWN adult male in NAD, sitting at bedside  Neuro: AAOx4, speech clear, MAE HEENT: MM pink/moist, no jvd, old mediastinoscopy scar  Cardiovascular: s1s2 rrr, no m/r/g  Lungs: even/non-labored, lungs bilaterally coarse, no wheezing  Abdomen: obese/soft, bsx4 active  Musculoskeletal: no acute deformities  Skin: warm/dry, no edema  Lymph:  No palpable lymph nodes in neck, chest or axilla     Recent Labs Lab 10/09/15 0445 10/10/15 0343  NA 140 138  K 3.5 4.3  CL 105 107  CO2 26 21*  BUN 18 14  CREATININE 0.97 0.98  GLUCOSE 152* 210*    Recent Labs Lab 10/09/15 0445 10/10/15 0343  HGB 12.8* 12.4*  HCT 39.9 39.5  WBC 7.8 10.3  PLT 335 373   Dg Chest 2 View  10/08/2015  CLINICAL DATA:  Body ache, shortness of breath, fever and cough for 1 week. History of lymphoma, asthma and hypertension. EXAM: CHEST  2 VIEW COMPARISON:  None. FINDINGS: LEFT upper lobe atelectasis with air bronchograms. Strandy densities LEFT lung base. RIGHT lung is clear. No pleural effusion. No pneumothorax. Air distended proximal esophagus. Cardiac silhouette is normal. Soft tissue planes and included osseous structures are nonsuspicious. IMPRESSION: Apparent LEFT upper lobe atelectasis versus scarring, with superimposed possible pneumonia. Recommend CT chest to assess for endobronchial lesion. LEFT lung base  atelectasis/scarring. Electronically Signed   By: Elon Alas M.D.   On: 10/08/2015 23:18   Ct Chest Wo Contrast  10/09/2015  CLINICAL DATA:  Shortness of breath tonight. Increased congestion. Low oxygen saturation. Possible endobronchial lesion on chest radiograph. History of hypertension and lymphoma. EXAM: CT CHEST WITHOUT CONTRAST TECHNIQUE: Multidetector CT imaging of the chest was performed following the standard protocol without IV contrast. COMPARISON:  Chest radiograph 10/08/2015 FINDINGS: Volume loss and consolidation in the left upper lung with air bronchograms. Left upper lung bronchus is patent but appears small. Changes are likely due to pneumonia but occult obstructing lesion is not excluded. Recommend follow-up imaging after resolution of acute process for further evaluation. Right lung is clear. Airways are patent. Normal heart size. Normal caliber thoracic aorta. No significant lymphadenopathy in the chest. Esophagus is decompressed. Included portions of the upper abdominal organs demonstrate a tiny low-attenuation lesion in the liver probably representing a cyst although too small to characterize. No destructive bone lesions. Calcification in the anterior mediastinum is of nonspecific etiology but appears benign. IMPRESSION: Collapse and consolidation of the left upper lung. This is likely to represent pneumonia but occult obstructing lesion is not excluded. Recommend followup after resolution of acute process. No mediastinal lymphadenopathy. Electronically Signed   By: Lucienne Capers M.D.   On: 10/09/2015 05:57    STUDIES:  CXR 02/15 > LUL atx vs scarring with possible superimposed PNA. CT chest 02/15 > collapse and consolidation of LUL, likely due to PNA but obstructing lesion not excluded.  SIGNIFICANT EVENTS  02/15 > admitted with SOB and probable CAP.  ASSESSMENT / PLAN:  Presumed CAP - CT with clear consolidation, volume loss, atx in LUL with noteable air bronchograms.   Favor PNA vs obstructing lesion, doubt TB but for completeness with hx.  PCT negative.    Plan: Agree with CAP coverage (azithro / rocephin). Follow cultures.  Pulmonary hygiene. Defer bronch for now. Albuterol TID Await urine strep antigen  Await quantiferon gold Follow CXR intermittently   Likely will need repeat CT imaging in near future PPI QD (home medication) Assess AFB x3 Await UDS Pulmicort BID  Will need pulmonary follow up at discharge and Rx for advair.  He is hopeful to schedule f/u after he has insurance (1 month) Attempt to obtain records [facility]    Acute hypoxic respiratory failure - due to CAP + possible asthma exac. Asthma with possible exacerbation. Former Tobacco Abuse   Plan: Continue supplemental O2 as needed to maintain SpO2 > 92%. Consider ambulatory desat study prior to d/c to assess home O2 needs. Continue BD's Taper steroids to oral in am     Rest per primary team.  Noe Gens, NP-C [facility] Pgr: [phone] or if no answer [phone] 10/10/2015, 10:49 AM   PCCM Attending Note: Patient seen & examined with nurse practitioner. Please refer to her progress note which I have reviewed. Patient's cough continuing to improve & still productive of some yellow mucus. Denies any fever or chills. He says overall he is feeling somewhat better.   BP 136/82 mmHg  Pulse 104  Temp(Src) 97.9 F (36.6 C) (Oral)  Resp 18  Ht 6' (1.829 m)  Wt 270 lb (122.471 kg)  BMI 36.61 kg/m2  SpO2 99% General:  Obese male. No distress. Awake & alert. Pulmonary:  Clear bilaterally to auscultation. Normal work of breathing on room air. Neurological:  CN 2-12 in tact. Grossly nonfocal. Moving all 4 extremities equally. Cardiovascular:  Regular rate & rhythm. No edema or JVD.  CBC Latest Ref Rng 10/10/2015 10/09/2015  WBC 4.0 - 10.5 K/uL 10.3 7.8  Hemoglobin 13.0 - 17.0 g/dL 12.4(L) 12.8(L)  Hematocrit 39.0 - 52.0 % 39.5 39.9  Platelets 150 -  400 K/uL 373 335    BMP Latest Ref Rng 10/10/2015 10/09/2015  Glucose 65 - 99 mg/dL 210(H) 152(H)  BUN 6 - 20 mg/dL 14 18  Creatinine 0.61 - 1.24 mg/dL 0.98 0.97  Sodium 135 - 145 mmol/L 138 140  Potassium 3.5 - 5.1 mmol/L 4.3 3.5  Chloride 101 - 111 mmol/L 107 105  CO2 22 - 32 mmol/L 21(L) 26  Calcium 8.9 - 10.3 mg/dL 9.3 9.5   A/P:  39y.o. 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 Awaiting records from [facility] regarding chronicity of LUL consolidation. Clinically improving. Significant doubt this is TB. No evidence of exacerbation of underlying asthma at this time.  1. CAP/Acute bronchitis:  Awaiting on culture results. Infectious workup pending. Continuing empiric Rocephin & Azithromycin. 2. LUL Consolidation:  Awaiting outside records to determine chronicity. Holding on bronchoscopy. Continuing 3% HS nebs tid. 3. Asthma:  No signs of asthma exacerbation at this time. Continue Albuterol tid and budesonide bid. Change to Prednisone 40mg  po daily.  Sonia Baller Ashok Cordia, M.D. [facility] Pager:  [phone] After 3pm or if no response, call [phone]

## 2015-10-10 NOTE — Progress Notes (Signed)
[facility] PROGRESS NOTE  Leonard [name] [id number] DOB: 28-Feb-1976 DOA: 10/08/2015 PCP: No primary care provider on file.  Summary 10/09/15: I have and examined Leonard [name] at bedside and reviewed his chart. Also consulted pulmonary. Appreciate help. Leonard [name] was admitted earlier this morning by Dr Tamala Julian. Please refer to his comprehensive H&P for details. Patient has left upper lobe pneumonia with question of left upper lobe collapse in setting of history of lymphoma in remission and recent incarceration. Question is whether he has a postobstructive process. Will follow pulmonary recommendations. 10/10/15: Appreciate pulmonary. Patient in airborne isolation, being investigated for TB. BP uncontrolled. Feels better but still has chest pain. Will add Lisinopril, continue antibiotics and defer respiratory work up to pulmonary. Plan Asthma exacerbation/Abnormal chest x-ray/Pneumonia  Day 2 Ceftriaxone/Zithromax  Sputum AFBs per pulmonary  Review records from [facility], ?FOB  Defer management to pulmonary Essential hypertension  Uncontrolled, Steroid element  Add Lisinopril and monitor renal function  Code Status: Full code Family Communication: None  Disposition Plan: Home eventually   Consultants:  PCCM  Procedures:    Antibiotics:  Ceftriaxone 10/09/15>>  Zithromax 10/09/15>>  HPI/Subjective: Still has chest pain.   Objective: Filed Vitals:   10/10/15 1400 10/10/15 2042  BP: 159/90 156/105  Pulse: 118 109  Temp: 98 F (36.7 C) 98.3 F (36.8 C)  Resp: 20 20    Intake/Output Summary (Last 24 hours) at 10/10/15 2224 Last data filed at 10/10/15 2042  Gross per 24 hour  Intake   2064 ml  Output      0 ml  Net   2064 ml   Filed Weights   10/08/15 2207 10/09/15 1752  Weight: 122.471 kg (270 lb) 122.471 kg (270 lb)    Exam:   General:  Comfortable at rest.  Cardiovascular: S1-S2 normal. No murmurs. Pulse regular.  Respiratory: Good air entry  bilaterally. No rhonchi or rales.  Abdomen: Soft and nontender. Normal bowel sounds. No organomegaly.  Musculoskeletal: No pedal edema   Neurological: Intact  Data Reviewed: Basic Metabolic Panel:  Recent Labs Lab 10/09/15 0445 10/10/15 0343  NA 140 138  K 3.5 4.3  CL 105 107  CO2 26 21*  GLUCOSE 152* 210*  BUN 18 14  CREATININE 0.97 0.98  CALCIUM 9.5 9.3   Liver Function Tests: No results for input(s): AST, ALT, ALKPHOS, BILITOT, PROT, ALBUMIN in the last 168 hours. No results for input(s): LIPASE, AMYLASE in the last 168 hours. No results for input(s): AMMONIA in the last 168 hours. CBC:  Recent Labs Lab 10/09/15 0445 10/10/15 0343  WBC 7.8 10.3  NEUTROABS 6.0  --   HGB 12.8* 12.4*  HCT 39.9 39.5  MCV 86.4 86.6  PLT 335 373   Cardiac Enzymes:  Recent Labs Lab 10/09/15 0906  TROPONINI <0.03   BNP (last 3 results) No results for input(s): BNP in the last 8760 hours.  ProBNP (last 3 results) No results for input(s): PROBNP in the last 8760 hours.  CBG: No results for input(s): GLUCAP in the last 168 hours.  Recent Results (from the past 240 hour(s))  Culture, blood (routine x 2) Call MD if unable to obtain prior to antibiotics being given     Status: None (Preliminary result)   Collection Time: 10/09/15  9:45 AM  Result Value Ref Range Status   Specimen Description BLOOD LEFT FOREARM  Final   Special Requests BOTTLES DRAWN AEROBIC AND ANAEROBIC 5ML  Final   Culture   Final    NO  GROWTH 1 DAY Performed at [facility]    Report Status PENDING  Incomplete  Culture, blood (routine x 2) Call MD if unable to obtain prior to antibiotics being given     Status: None (Preliminary result)   Collection Time: 10/09/15  9:45 AM  Result Value Ref Range Status   Specimen Description BLOOD LEFT FOREARM  Final   Special Requests BOTTLES DRAWN AEROBIC AND ANAEROBIC 5ML  Final   Culture   Final    NO GROWTH 1 DAY Performed at [facility]     Report Status PENDING  Incomplete     Studies: Dg Chest 2 View  10/08/2015  CLINICAL DATA:  Body ache, shortness of breath, fever and cough for 1 week. History of lymphoma, asthma and hypertension. EXAM: CHEST  2 VIEW COMPARISON:  None. FINDINGS: LEFT upper lobe atelectasis with air bronchograms. Strandy densities LEFT lung base. RIGHT lung is clear. No pleural effusion. No pneumothorax. Air distended proximal esophagus. Cardiac silhouette is normal. Soft tissue planes and included osseous structures are nonsuspicious. IMPRESSION: Apparent LEFT upper lobe atelectasis versus scarring, with superimposed possible pneumonia. Recommend CT chest to assess for endobronchial lesion. LEFT lung base atelectasis/scarring. Electronically Signed   By: Elon Alas M.D.   On: 10/08/2015 23:18   Ct Chest Wo Contrast  10/09/2015  CLINICAL DATA:  Shortness of breath tonight. Increased congestion. Low oxygen saturation. Possible endobronchial lesion on chest radiograph. History of hypertension and lymphoma. EXAM: CT CHEST WITHOUT CONTRAST TECHNIQUE: Multidetector CT imaging of the chest was performed following the standard protocol without IV contrast. COMPARISON:  Chest radiograph 10/08/2015 FINDINGS: Volume loss and consolidation in the left upper lung with air bronchograms. Left upper lung bronchus is patent but appears small. Changes are likely due to pneumonia but occult obstructing lesion is not excluded. Recommend follow-up imaging after resolution of acute process for further evaluation. Right lung is clear. Airways are patent. Normal heart size. Normal caliber thoracic aorta. No significant lymphadenopathy in the chest. Esophagus is decompressed. Included portions of the upper abdominal organs demonstrate a tiny low-attenuation lesion in the liver probably representing a cyst although too small to characterize. No destructive bone lesions. Calcification in the anterior mediastinum is of nonspecific etiology but  appears benign. IMPRESSION: Collapse and consolidation of the left upper lung. This is likely to represent pneumonia but occult obstructing lesion is not excluded. Recommend followup after resolution of acute process. No mediastinal lymphadenopathy. Electronically Signed   By: Lucienne Capers M.D.   On: 10/09/2015 05:57    Scheduled Meds: . albuterol  2.5 mg Nebulization TID  . amLODipine  10 mg Oral Daily  . azithromycin  500 mg Intravenous Q24H  . budesonide (PULMICORT) nebulizer solution  0.5 mg Nebulization BID  . cefTRIAXone (ROCEPHIN)  IV  1 g Intravenous Q24H  . enoxaparin (LOVENOX) injection  40 mg Subcutaneous Q24H  . gabapentin  800 mg Oral TID  . guaiFENesin  600 mg Oral BID  . pantoprazole  40 mg Oral Daily  . [START ON 10/11/2015] predniSONE  40 mg Oral Q breakfast  . sodium chloride HYPERTONIC  5 mL Nebulization TID   Continuous Infusions:    Time spent: 25 minutes    Leonard [name]  [facility] Pager [phone]. If 7PM-7AM, please contact night-coverage at [email or web address], password [facility] 10/10/2015, 10:24 PM  LOS: 1 day

## 2015-10-10 NOTE — Progress Notes (Signed)
Inappropriate Case Management Consult:  To retrieve past medical records.  This is done by the floor staff or the MD. Marney Doctor RN,BSN,NCM [phone]

## 2015-10-11 ENCOUNTER — Other Ambulatory Visit: Payer: Self-pay

## 2015-10-11 ENCOUNTER — Inpatient Hospital Stay (HOSPITAL_COMMUNITY): Payer: MEDICAID

## 2015-10-11 DIAGNOSIS — J45901 Unspecified asthma with (acute) exacerbation: Secondary | ICD-10-CM

## 2015-10-11 LAB — LEGIONELLA ANTIGEN, URINE

## 2015-10-11 LAB — QUANTIFERON IN TUBE

## 2015-10-11 LAB — BASIC METABOLIC PANEL
Anion gap: 10 (ref 5–15)
BUN: 21 mg/dL — ABNORMAL HIGH (ref 6–20)
CALCIUM: 9.3 mg/dL (ref 8.9–10.3)
CO2: 25 mmol/L (ref 22–32)
CREATININE: 0.98 mg/dL (ref 0.61–1.24)
Chloride: 107 mmol/L (ref 101–111)
GFR calc non Af Amer: >60 mL/min (ref >60)
Glucose, Bld: 192 mg/dL — ABNORMAL HIGH (ref 65–99)
Potassium: 4.2 mmol/L (ref 3.5–5.1)
SODIUM: 142 mmol/L (ref 135–145)

## 2015-10-11 LAB — CBC
HCT: 38 % — ABNORMAL LOW (ref 39.0–52.0)
Hemoglobin: 12.2 g/dL — ABNORMAL LOW (ref 13.0–17.0)
MCH: 27.5 pg (ref 26.0–34.0)
MCHC: 32.1 g/dL (ref 30.0–36.0)
MCV: 85.8 fL (ref 78.0–100.0)
Platelets: 350 10*3/uL (ref 150–400)
RBC: 4.43 MIL/uL (ref 4.22–5.81)
RDW: 15.7 % — AB (ref 11.5–15.5)
WBC: 13.1 10*3/uL — ABNORMAL HIGH (ref 4.0–10.5)

## 2015-10-11 LAB — TROPONIN I
Troponin I: 0.03 ng/mL (ref <0.031)
Troponin I: 0.03 ng/mL (ref <0.031)
Troponin I: 0.03 ng/mL (ref <0.031)

## 2015-10-11 LAB — PROCALCITONIN

## 2015-10-11 MED ORDER — FUROSEMIDE 20 MG PO TABS
20.0000 mg | ORAL_TABLET | Freq: Every day | ORAL | Status: DC
  Administered 2015-10-12 – 2015-10-13 (×2): 20 mg via ORAL
  Filled 2015-10-11 (×2): qty 1

## 2015-10-11 MED ORDER — KETOROLAC TROMETHAMINE 30 MG/ML IJ SOLN
30.0000 mg | Freq: Once | INTRAMUSCULAR | Status: AC
  Administered 2015-10-11: 30 mg via INTRAVENOUS
  Filled 2015-10-11: qty 1

## 2015-10-11 MED ORDER — FUROSEMIDE 10 MG/ML IJ SOLN
20.0000 mg | Freq: Once | INTRAMUSCULAR | Status: AC
  Administered 2015-10-11: 20 mg via INTRAVENOUS
  Filled 2015-10-11: qty 2

## 2015-10-11 MED ORDER — MORPHINE SULFATE (PF) 2 MG/ML IV SOLN: 2.0000 mg | INTRAVENOUS | Status: DC | PRN

## 2015-10-11 MED ORDER — HYDRALAZINE HCL 20 MG/ML IJ SOLN
5.0000 mg | Freq: Four times a day (QID) | INTRAMUSCULAR | Status: DC | PRN
  Administered 2015-10-11 (×3): 5 mg via INTRAVENOUS
  Filled 2015-10-11 (×3): qty 1

## 2015-10-11 MED ORDER — METHYLPREDNISOLONE SODIUM SUCC 125 MG IJ SOLR
60.0000 mg | INTRAMUSCULAR | Status: DC
  Administered 2015-10-11 – 2015-10-12 (×2): 60 mg via INTRAVENOUS
  Filled 2015-10-11 (×2): qty 2

## 2015-10-11 MED ORDER — SUCRALFATE 1 GM/10ML PO SUSP
1.0000 g | Freq: Three times a day (TID) | ORAL | Status: DC
  Administered 2015-10-11 – 2015-10-13 (×8): 1 g via ORAL
  Filled 2015-10-11 (×8): qty 10

## 2015-10-11 NOTE — Progress Notes (Signed)
[facility] PROGRESS NOTE  Leonard [name] [id number] DOB: April 22, 1976 DOA: 10/08/2015 PCP: No primary care provider on file.  Summary 10/09/15: I have and examined Leonard [name] at bedside and reviewed his chart. Also consulted pulmonary. Appreciate help. Leonard [name] was admitted earlier this morning by Dr Tamala Julian. Please refer to his comprehensive H&P for details. Patient has left upper lobe pneumonia with question of left upper lobe collapse in setting of history of lymphoma in remission and recent incarceration. Question is whether he has a postobstructive process. Will follow pulmonary recommendations. 10/10/15: Appreciate pulmonary. Patient in airborne isolation, being investigated for TB. BP uncontrolled. Feels better but still has chest pain. Will add Lisinopril, continue antibiotics and defer respiratory work up to pulmonary. 10/11/15: Appreciate pulmonary. Sputum AFB negative 1. BP uncontrolled but better, still has chest pain. Showering at the time of my visitation. Will continue current antibiotics/sputum AFBs and follow pulmonary recommendations. Will add Lasix. Plan Asthma exacerbation/Abnormal chest x-ray/Pneumonia  Day 3 Ceftriaxone/Zithromax  Sputum AFBs per pulmonary  Follow pulmonary recommendations Essential hypertension  Uncontrolled, Steroid element. Add low dose Lasix  Continue Lisinopril   Monitor renal function  Code Status: Full code Family Communication: None  Disposition Plan: Home eventually   Consultants:  PCCM  Procedures:    Antibiotics:  Ceftriaxone 10/09/15>>  Zithromax 10/09/15>>  HPI/Subjective: Feels okay. Stress and chest pain.  Objective: Filed Vitals:   10/11/15 1337 10/11/15 1838  BP: 156/107 142/97  Pulse: 113 112  Temp: 97.9 F (36.6 C)   Resp: 20     Intake/Output Summary (Last 24 hours) at 10/11/15 1944 Last data filed at 10/11/15 1413  Gross per 24 hour  Intake   2068 ml  Output      0 ml  Net   2068 ml    Filed Weights   10/08/15 2207 10/09/15 1752  Weight: 122.471 kg (270 lb) 122.471 kg (270 lb)    Exam:   General:  Comfortable at rest.  Cardiovascular: S1-S2 normal. No murmurs. Pulse regular.  Respiratory: Good air entry bilaterally. No rhonchi or rales.  Abdomen: Soft and nontender. Normal bowel sounds. No organomegaly.  Musculoskeletal: No pedal edema   Neurological: Intact  Data Reviewed: Basic Metabolic Panel:  Recent Labs Lab 10/09/15 0445 10/10/15 0343 10/11/15 0334  NA 140 138 142  K 3.5 4.3 4.2  CL 105 107 107  CO2 26 21* 25  GLUCOSE 152* 210* 192*  BUN 18 14 21*  CREATININE 0.97 0.98 0.98  CALCIUM 9.5 9.3 9.3   Liver Function Tests: No results for input(s): AST, ALT, ALKPHOS, BILITOT, PROT, ALBUMIN in the last 168 hours. No results for input(s): LIPASE, AMYLASE in the last 168 hours. No results for input(s): AMMONIA in the last 168 hours. CBC:  Recent Labs Lab 10/09/15 0445 10/10/15 0343 10/11/15 0334  WBC 7.8 10.3 13.1*  NEUTROABS 6.0  --   --   HGB 12.8* 12.4* 12.2*  HCT 39.9 39.5 38.0*  MCV 86.4 86.6 85.8  PLT 335 373 350   Cardiac Enzymes:  Recent Labs Lab 10/09/15 0906 10/11/15 0012 10/11/15 0630 10/11/15 1128  TROPONINI <0.03 0.03 0.03 0.03   BNP (last 3 results) No results for input(s): BNP in the last 8760 hours.  ProBNP (last 3 results) No results for input(s): PROBNP in the last 8760 hours.  CBG: No results for input(s): GLUCAP in the last 168 hours.  Recent Results (from the past 240 hour(s))  Culture, blood (routine x 2) Call MD if unable to  obtain prior to antibiotics being given     Status: None (Preliminary result)   Collection Time: 10/09/15  9:45 AM  Result Value Ref Range Status   Specimen Description BLOOD LEFT FOREARM  Final   Special Requests BOTTLES DRAWN AEROBIC AND ANAEROBIC 5ML  Final   Culture   Final    NO GROWTH 2 DAYS Performed at [facility]    Report Status PENDING  Incomplete   Culture, blood (routine x 2) Call MD if unable to obtain prior to antibiotics being given     Status: None (Preliminary result)   Collection Time: 10/09/15  9:45 AM  Result Value Ref Range Status   Specimen Description BLOOD LEFT FOREARM  Final   Special Requests BOTTLES DRAWN AEROBIC AND ANAEROBIC 5ML  Final   Culture   Final    NO GROWTH 2 DAYS Performed at [facility]    Report Status PENDING  Incomplete  AFB culture with smear (NOT at [facility])     Status: None (Preliminary result)   Collection Time: 10/10/15  9:51 AM  Result Value Ref Range Status   Specimen Description SPUTUM  Final   Special Requests NONE  Final   Acid Fast Smear   Final    NO ACID FAST BACILLI SEEN Performed at [facility]    Culture   Final    CULTURE WILL BE EXAMINED FOR 6 WEEKS BEFORE ISSUING A FINAL REPORT Performed at [facility]    Report Status PENDING  Incomplete     Studies: Dg Chest Port 1 View  10/11/2015  CLINICAL DATA:  Increasing upper mid chest pain. EXAM: PORTABLE CHEST 1 VIEW COMPARISON:  CT chest 10/09/2015.  Chest 10/08/2015. FINDINGS: Shallow inspiration. Normal heart size and pulmonary vascularity. Left upper lung collapse and consolidation as seen on previous study. Consider pneumonia versus endobronchial obstructing lesion. Bronchoscopy may be useful in further evaluation if clinically indicated. Followup PA and lateral chest X-ray is recommended in 3-4 weeks following trial of antibiotic therapy to ensure resolution and exclude underlying malignancy. Right lung is clear. No blunting of costophrenic angles. No pneumothorax. IMPRESSION: Persistent collapse and consolidation in the left upper lung similar to previous study. Electronically Signed   By: Lucienne Capers M.D.   On: 10/11/2015 01:08    Scheduled Meds: . albuterol  2.5 mg Nebulization TID  . amLODipine  10 mg Oral Daily  . azithromycin  500 mg Intravenous Q24H  . budesonide (PULMICORT) nebulizer  solution  0.5 mg Nebulization BID  . cefTRIAXone (ROCEPHIN)  IV  1 g Intravenous Q24H  . enoxaparin (LOVENOX) injection  40 mg Subcutaneous Q24H  . furosemide  20 mg Intravenous Once  . gabapentin  800 mg Oral TID  . guaiFENesin  600 mg Oral BID  . lisinopril  5 mg Oral Daily  . methylPREDNISolone (SOLU-MEDROL) injection  60 mg Intravenous Q24H  . pantoprazole  40 mg Oral Daily  . sodium chloride HYPERTONIC  5 mL Nebulization TID  . sucralfate  1 g Oral TID WC & HS   Continuous Infusions:    Time spent: 25 minutes    Lorel Lembo  [facility] Pager [phone]. If 7PM-7AM, please contact night-coverage at [email or web address], password [facility] 10/11/2015, 7:44 PM  LOS: 2 days

## 2015-10-11 NOTE — Progress Notes (Addendum)
Name: Keizer Stigen MRN: [id number] DOB: [birth year]    ADMISSION DATE:  10/08/2015 CONSULTATION DATE:  10/09/15  REFERRING MD :  Sanjuana Letters  CHIEF COMPLAINT:  SOB    SUBJECTIVE: Pt reports central stabbing chest pain briefly relieved by toradol but returns.  Troponin negative, EKG reviewed without acute ST changes).  CT data reviewed from [facility] (appreciate nursing assistance with obtaining records).    VITAL SIGNS: Temp:  [97.9 F (36.6 C)-98.3 F (36.8 C)] 97.9 F (36.6 C) (02/17 0926) Pulse Rate:  [107-118] 113 (02/17 1059) Resp:  [18-20] 18 (02/17 0926) BP: (145-178)/(73-112) 162/104 mmHg (02/17 1103) SpO2:  [97 %-99 %] 99 % (02/17 0926)  PHYSICAL EXAMINATION: General: WDWN adult male in NAD, sitting at bedside  Neuro: AAOx4, speech clear, MAE HEENT: MM pink/moist, no jvd, old mediastinoscopy scar  Cardiovascular: s1s2 rrr, no m/r/g  Lungs: even/non-labored, lungs bilaterally with expiratory wheezing  Abdomen: obese/soft, bsx4 active  Musculoskeletal: no acute deformities  Skin: warm/dry, 1+ pitting LE edema  Lymph:  No palpable lymph nodes in neck, chest or axilla     Recent Labs Lab 10/09/15 0445 10/10/15 0343 10/11/15 0334  NA 140 138 142  K 3.5 4.3 4.2  CL 105 107 107  CO2 26 21* 25  BUN 18 14 21*  CREATININE 0.97 0.98 0.98  GLUCOSE 152* 210* 192*    Recent Labs Lab 10/09/15 0445 10/10/15 0343 10/11/15 0334  HGB 12.8* 12.4* 12.2*  HCT 39.9 39.5 38.0*  WBC 7.8 10.3 13.1*  PLT 335 373 350   Dg Chest Port 1 View  10/11/2015  CLINICAL DATA:  Increasing upper mid chest pain. EXAM: PORTABLE CHEST 1 VIEW COMPARISON:  CT chest 10/09/2015.  Chest 10/08/2015. FINDINGS: Shallow inspiration. Normal heart size and pulmonary vascularity. Left upper lung collapse and consolidation as seen on previous study. Consider pneumonia versus endobronchial obstructing lesion. Bronchoscopy may be useful in further evaluation if clinically indicated. Followup PA and lateral chest  X-ray is recommended in 3-4 weeks following trial of antibiotic therapy to ensure resolution and exclude underlying malignancy. Right lung is clear. No blunting of costophrenic angles. No pneumothorax. IMPRESSION: Persistent collapse and consolidation in the left upper lung similar to previous study. Electronically Signed   By: Lucienne Capers M.D.   On: 10/11/2015 01:08   CULTURES:  U. Strep 2/16 >> neg  UA 2/16 >> neg  HIV 2/15 >> neg  BCx2 2/15 >>  AFB 2/16 >>   [facility] DATA:   03/06/15  CT Chest w contrast >> again demonstrated are fibrotic changes in the left lung apex and right lung apex medially, 1.1 cm rounded density present in the left lung base, which appears to represent the residual of more scattered nosdular densities evident on the prior study from 8 months earlier, no adenopathy  03/21/15  MRI Brain w &w/o >> no acute intracranial process, encephalomalacia involving the R temporal lobe, stable since prior CT 107/15, no enhancing lesions  STUDIES:  CXR 02/15 > LUL atx vs scarring with possible superimposed PNA. CT chest 02/15 > collapse and consolidation of LUL, likely due to PNA but obstructing lesion not excluded.  SIGNIFICANT EVENTS  02/15 > admitted with SOB and probable CAP. 02/16 > transitioned to oral steroids 02/17 > increased wheezing, central chest pain  ASSESSMENT / PLAN:  Presumed CAP - CT with clear consolidation, volume loss, atx in LUL with noteable air bronchograms.  Favor PNA vs obstructing lesion, doubt TB but for completeness with social hx will assess.  PCT negative.    Plan: Agree with CAP coverage (azithro / rocephin). Follow cultures.  Pulmonary hygiene. Defer bronch for now. Albuterol TID Await quantiferon gold, if negative would d/c isolation precautions Follow CXR intermittently   Likely will need repeat CT imaging in near future PPI QD (home medication) Assess AFB x3 Pulmicort BID  Will need pulmonary follow up at discharge and Rx for advair.   He is hopeful to schedule f/u after he has insurance (1 month) Attempt to obtain records from [facility]    Acute hypoxic respiratory failure - due to CAP + possible asthma exac. Asthma with possible exacerbation. Former Tobacco Abuse   Plan: Return to IV steroids, 60 mg QD Continue BD's Taper steroids to oral in am   Chest Pain  Hypertension - steroids administration ?  LE 1+ edema - ? Steroid administration  Tachycardia   Plan: Per Primary  Lasix 20 mg IV x1  Trial Carafate (? Gastritis / reflux from steroids)     Rest per primary team.    Noe Gens, NP-C [facility] Pgr: [phone] or if no answer [phone] 10/11/2015, 11:21 AM  PCCM Attending Note: Patient seen & examined with nurse practitioner. Please refer to her progress note which I have reviewed. Cough and wheezing have worsened today somewhat possibly secondary to volume overload versus his underlying asthma. It still questionable whether or not these findings in his left upper lobe are chronic. I do not feel really related to his current ongoing illness and asthma exacerbation.   BP 156/107 mmHg  Pulse 113  Temp(Src) 97.9 F (36.6 C) (Oral)  Resp 20  Ht 6' (1.829 m)  Wt 270 lb (122.471 kg)  BMI 36.61 kg/m2  SpO2 98% General: Obese male. No distress.Sitting up in chair.  Pulmonary:Mild end expiratory wheeze diffusely. Normal work of breathing on room air. Neurological: CN 2-12 in tact. Grossly nonfocal. Moving all 4 extremities equally. Cardiovascular: Regular rate & rhythm.Mild lower extremity edema.   CBC Latest Ref Rng 10/11/2015 10/10/2015 10/09/2015  WBC 4.0 - 10.5 K/uL 13.1(H) 10.3 7.8  Hemoglobin 13.0 - 17.0 g/dL 12.2(L) 12.4(L) 12.8(L)  Hematocrit 39.0 - 52.0 % 38.0(L) 39.5 39.9  Platelets 150 - 400 K/uL 350 373 335    A/P: 40y.o. 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 Continuing to defer bronchoscopy.  underlying asthma at this time.Restarting IV  steroids at this time. Could consider diuresis.  1. CAP/Acute bronchitis: Awaiting on culture results. Infectious workup pending. Continuing empiric Rocephin & Azithromycin. 2. LUL Consolidation: Quite possibly could be chronic scarring given outside records.  3. Asthma with mild exacerbation: Switching back to Solu-Medrol IV daily. Continuing nebs.  We will reassess the patient on Monday. Please contact the on-call physician if there is an acute change or question over the weekend.  Sonia Baller Ashok Cordia, M.D. [facility] Pager: [phone] After 3pm or if no response, call [phone]

## 2015-10-11 NOTE — Progress Notes (Signed)
Patient reports worsening SOB and chest pain post nebulizers and states "it makes my blood pressure go up". He agrees to call for BD if he feels SOB during the night.

## 2015-10-11 NOTE — Progress Notes (Signed)
Patient c/o midsternal chest pain,described by him as a constant, mild discomfort, pain level of 6/10, denies SOB. Symptoms reprted to Dr. Alta Corning order for Toradol received and given/ Also MD aware of results of EKG. Patient in the recliner chair.Calm, alert and oriented x4. Will continue to monitor.

## 2015-10-11 NOTE — Progress Notes (Signed)
Pt concerned about ongoing midsternal chest pain rated 6/10. He says this has been present since a few days before admission, is constant, and pain meds don't really have an effect on the level of pain. He had EKG and troponin checked on 2/14-2/15 but still feels uneasy about the pain. I gave him 2 vicodin and heat pack with very little decrease in pain. Also noted that BP is significantly elevated and HR tachy. I didn't see a lot of details in the notes about the chest pain, so I paged on call NP just to run issue by her and see if we needed to do anything else. She ordered EKG, CXR, troponin levels, and one dose of morphine IV. Will inform her of results and pain level as able. Continue to monitor. Hortencia Conradi RN

## 2015-10-12 LAB — BASIC METABOLIC PANEL
Anion gap: 10 (ref 5–15)
BUN: 25 mg/dL — AB (ref 6–20)
CALCIUM: 9.3 mg/dL (ref 8.9–10.3)
CO2: 24 mmol/L (ref 22–32)
CREATININE: 1.06 mg/dL (ref 0.61–1.24)
Chloride: 104 mmol/L (ref 101–111)
GFR calc non Af Amer: >60 mL/min (ref >60)
Glucose, Bld: 133 mg/dL — ABNORMAL HIGH (ref 65–99)
Potassium: 3.8 mmol/L (ref 3.5–5.1)
SODIUM: 138 mmol/L (ref 135–145)

## 2015-10-12 LAB — CBC
HCT: 42 % (ref 39.0–52.0)
HEMOGLOBIN: 13.2 g/dL (ref 13.0–17.0)
MCH: 27.5 pg (ref 26.0–34.0)
MCHC: 31.4 g/dL (ref 30.0–36.0)
MCV: 87.5 fL (ref 78.0–100.0)
PLATELETS: 439 10*3/uL — AB (ref 150–400)
RBC: 4.8 MIL/uL (ref 4.22–5.81)
RDW: 16.1 % — AB (ref 11.5–15.5)
WBC: 17 10*3/uL — AB (ref 4.0–10.5)

## 2015-10-12 MED ORDER — PREDNISONE 50 MG PO TABS
50.0000 mg | ORAL_TABLET | Freq: Every day | ORAL | Status: DC
  Administered 2015-10-13: 50 mg via ORAL
  Filled 2015-10-12: qty 1

## 2015-10-12 NOTE — Progress Notes (Signed)
Patient states he is feeling better without taking scheduled treatments. Patient is also concerned that he does not completely understand what is going on. RT will continue to monitor patient.

## 2015-10-12 NOTE — Progress Notes (Signed)
[facility] PROGRESS NOTE  Leonard [name] [id number] DOB: 1976/04/28 DOA: 10/08/2015 PCP: No primary care provider on file.  Summary 10/09/15: I have and examined Leonard [name] at bedside and reviewed his chart. Also consulted pulmonary. Appreciate help. Leonard [name] was admitted earlier this morning by Dr Tamala Julian. Please refer to his comprehensive H&P for details. Patient has left upper lobe pneumonia with question of left upper lobe collapse in setting of history of lymphoma in remission and recent incarceration. Question is whether he has a postobstructive process. Will follow pulmonary recommendations. 10/10/15: Appreciate pulmonary. Patient in airborne isolation, being investigated for TB. BP uncontrolled. Feels better but still has chest pain. Will add Lisinopril, continue antibiotics and defer respiratory work up to pulmonary. 10/11/15: Appreciate pulmonary. Sputum AFB negative 1. BP uncontrolled but better, still has chest pain. Showering at the time of my visitation. Will continue current antibiotics/sputum AFBs and follow pulmonary recommendations. Will add Lasix. 10/12/15: Appreciate pulmonary. QuantiFERON results pending. Patient feels better. Only 1 AFB stain. Will continue current antibiotics and switch steroids to oral, follow QuantiFERON/AFB stain, if negative discharge home, hopefully in the next day or 2. Plan Asthma exacerbation/Abnormal chest x-ray/Pneumonia  Day 4 Ceftriaxone/Zithromax  Sputum AFBs/QuantiFERON  Follow pulmonary recommendations Essential hypertension  Better controlled  Continue Lisinopril/Lasix  Monitor renal function  Code Status: Full code Family Communication: None  Disposition Plan: Home eventually   Consultants:  PCCM  Procedures:    Antibiotics:  Ceftriaxone 10/09/15>>  Zithromax 10/09/15>>  HPI/Subjective: Feels better. No complaints.  Objective: Filed Vitals:   10/12/15 0631 10/12/15 1310  BP: 157/96 148/97  Pulse: 95  100  Temp: 97.7 F (36.5 C) 98 F (36.7 C)  Resp: 20 20    Intake/Output Summary (Last 24 hours) at 10/12/15 1958 Last data filed at 10/12/15 1845  Gross per 24 hour  Intake   1380 ml  Output      0 ml  Net   1380 ml   Filed Weights   10/08/15 2207 10/09/15 1752  Weight: 122.471 kg (270 lb) 122.471 kg (270 lb)    Exam:   General:  Comfortable at rest.  Cardiovascular: S1-S2 normal. No murmurs. Pulse regular.  Respiratory: Good air entry bilaterally. No rhonchi or rales.  Abdomen: Soft and nontender. Normal bowel sounds. No organomegaly.  Musculoskeletal: No pedal edema   Neurological: Intact  Data Reviewed: Basic Metabolic Panel:  Recent Labs Lab 10/09/15 0445 10/10/15 0343 10/11/15 0334 10/12/15 0347  NA 140 138 142 138  K 3.5 4.3 4.2 3.8  CL 105 107 107 104  CO2 26 21* 25 24  GLUCOSE 152* 210* 192* 133*  BUN 18 14 21* 25*  CREATININE 0.97 0.98 0.98 1.06  CALCIUM 9.5 9.3 9.3 9.3   Liver Function Tests: No results for input(s): AST, ALT, ALKPHOS, BILITOT, PROT, ALBUMIN in the last 168 hours. No results for input(s): LIPASE, AMYLASE in the last 168 hours. No results for input(s): AMMONIA in the last 168 hours. CBC:  Recent Labs Lab 10/09/15 0445 10/10/15 0343 10/11/15 0334 10/12/15 0347  WBC 7.8 10.3 13.1* 17.0*  NEUTROABS 6.0  --   --   --   HGB 12.8* 12.4* 12.2* 13.2  HCT 39.9 39.5 38.0* 42.0  MCV 86.4 86.6 85.8 87.5  PLT 335 373 350 439*   Cardiac Enzymes:  Recent Labs Lab 10/09/15 0906 10/11/15 0012 10/11/15 0630 10/11/15 1128  TROPONINI <0.03 0.03 0.03 0.03   BNP (last 3 results) No results for input(s): BNP in the  last 8760 hours.  ProBNP (last 3 results) No results for input(s): PROBNP in the last 8760 hours.  CBG: No results for input(s): GLUCAP in the last 168 hours.  Recent Results (from the past 240 hour(s))  Culture, blood (routine x 2) Call MD if unable to obtain prior to antibiotics being given     Status: None  (Preliminary result)   Collection Time: 10/09/15  9:45 AM  Result Value Ref Range Status   Specimen Description BLOOD LEFT FOREARM  Final   Special Requests BOTTLES DRAWN AEROBIC AND ANAEROBIC 5ML  Final   Culture   Final    NO GROWTH 3 DAYS Performed at [facility]    Report Status PENDING  Incomplete  Culture, blood (routine x 2) Call MD if unable to obtain prior to antibiotics being given     Status: None (Preliminary result)   Collection Time: 10/09/15  9:45 AM  Result Value Ref Range Status   Specimen Description BLOOD LEFT FOREARM  Final   Special Requests BOTTLES DRAWN AEROBIC AND ANAEROBIC 5ML  Final   Culture   Final    NO GROWTH 3 DAYS Performed at [facility]    Report Status PENDING  Incomplete  AFB culture with smear (NOT at [facility])     Status: None (Preliminary result)   Collection Time: 10/10/15  9:51 AM  Result Value Ref Range Status   Specimen Description SPUTUM  Final   Special Requests NONE  Final   Acid Fast Smear   Final    NO ACID FAST BACILLI SEEN Performed at [facility]    Culture   Final    CULTURE WILL BE EXAMINED FOR 6 WEEKS BEFORE ISSUING A FINAL REPORT Performed at [facility]    Report Status PENDING  Incomplete     Studies: Dg Chest Port 1 View  10/11/2015  CLINICAL DATA:  Increasing upper mid chest pain. EXAM: PORTABLE CHEST 1 VIEW COMPARISON:  CT chest 10/09/2015.  Chest 10/08/2015. FINDINGS: Shallow inspiration. Normal heart size and pulmonary vascularity. Left upper lung collapse and consolidation as seen on previous study. Consider pneumonia versus endobronchial obstructing lesion. Bronchoscopy may be useful in further evaluation if clinically indicated. Followup PA and lateral chest X-ray is recommended in 3-4 weeks following trial of antibiotic therapy to ensure resolution and exclude underlying malignancy. Right lung is clear. No blunting of costophrenic angles. No pneumothorax. IMPRESSION: Persistent  collapse and consolidation in the left upper lung similar to previous study. Electronically Signed   By: Lucienne Capers M.D.   On: 10/11/2015 01:08    Scheduled Meds: . albuterol  2.5 mg Nebulization TID  . amLODipine  10 mg Oral Daily  . azithromycin  500 mg Intravenous Q24H  . budesonide (PULMICORT) nebulizer solution  0.5 mg Nebulization BID  . cefTRIAXone (ROCEPHIN)  IV  1 g Intravenous Q24H  . enoxaparin (LOVENOX) injection  40 mg Subcutaneous Q24H  . furosemide  20 mg Oral Daily  . gabapentin  800 mg Oral TID  . guaiFENesin  600 mg Oral BID  . lisinopril  5 mg Oral Daily  . pantoprazole  40 mg Oral Daily  . [START ON 10/13/2015] predniSONE  50 mg Oral Q breakfast  . sodium chloride HYPERTONIC  5 mL Nebulization TID  . sucralfate  1 g Oral TID WC & HS   Continuous Infusions:    Time spent: 25 minutes    Alina Gilkey  [facility] Pager [phone]. If 7PM-7AM, please  contact night-coverage at [email or web address], password [facility] 10/12/2015, 7:58 PM  LOS: 3 days

## 2015-10-12 NOTE — Progress Notes (Signed)
Utilization review completed.  

## 2015-10-13 LAB — BASIC METABOLIC PANEL
Anion gap: 8 (ref 5–15)
BUN: 31 mg/dL — AB (ref 6–20)
CALCIUM: 8.8 mg/dL — AB (ref 8.9–10.3)
CHLORIDE: 105 mmol/L (ref 101–111)
CO2: 27 mmol/L (ref 22–32)
CREATININE: 1.05 mg/dL (ref 0.61–1.24)
GFR calc non Af Amer: >60 mL/min (ref >60)
GLUCOSE: 126 mg/dL — AB (ref 65–99)
Potassium: 3.8 mmol/L (ref 3.5–5.1)
Sodium: 140 mmol/L (ref 135–145)

## 2015-10-13 LAB — CBC
HEMATOCRIT: 39.6 % (ref 39.0–52.0)
HEMOGLOBIN: 12.5 g/dL — AB (ref 13.0–17.0)
MCH: 27.2 pg (ref 26.0–34.0)
MCHC: 31.6 g/dL (ref 30.0–36.0)
MCV: 86.3 fL (ref 78.0–100.0)
Platelets: 401 10*3/uL — ABNORMAL HIGH (ref 150–400)
RBC: 4.59 MIL/uL (ref 4.22–5.81)
RDW: 16 % — AB (ref 11.5–15.5)
WBC: 15.4 10*3/uL — ABNORMAL HIGH (ref 4.0–10.5)

## 2015-10-13 LAB — PROCALCITONIN: Procalcitonin: <0.1 ng/mL

## 2015-10-13 MED ORDER — PREDNISONE 10 MG PO TABS
ORAL_TABLET | ORAL | Status: DC
Start: 2015-10-13 — End: 2015-12-11

## 2015-10-13 MED ORDER — ALBUTEROL SULFATE HFA 108 (90 BASE) MCG/ACT IN AERS: 2.0000 | INHALATION_SPRAY | Freq: Four times a day (QID) | RESPIRATORY_TRACT | Status: DC | PRN

## 2015-10-13 MED ORDER — SUCRALFATE 1 GM/10ML PO SUSP: 1.0000 g | Freq: Three times a day (TID) | ORAL | Status: DC

## 2015-10-13 MED ORDER — AMOXICILLIN-POT CLAVULANATE 875-125 MG PO TABS: 1.0000 | ORAL_TABLET | Freq: Two times a day (BID) | ORAL | Status: DC

## 2015-10-13 NOTE — Discharge Summary (Signed)
Leonard [name], is a 40 y.o. male  DOB 1975/09/10  MRN [id number].  Admission date:  10/08/2015  Admitting Physician  Norval Morton, MD  Discharge Date:  10/13/2015   Primary MD  No primary care provider on file.  Recommendations for primary care physician for things to follow:  Follow QuantiFeron. May need referral to pulmonary  Admission Diagnosis   Hypoxia [R09.02] Abnormal chest x-ray [R93.8] Asthma exacerbation [J45.901]   Discharge Diagnosis  Hypoxia [R09.02] Abnormal chest x-ray [R93.8] Asthma exacerbation [J45.901]   Principal Problem:   Asthma exacerbation Active Problems:   Abnormal chest x-ray   Pneumonia   Essential hypertension      Summary Leonard [name] is a 40 year old male who came in with cough and was found to have Pneumonia, with no endobronchial lestions. He was seen by pulmonary who recommended sputum afb, quantiFerron. Patients did not want to wait for final results of tests for TB. He will follow with his pcp in 1 week. He responded to Ceftriaxone?zithromax and will d//c on coumadin.Please refer to the daily progress notes below for details of patient's hospital stay;  Hospital Course  10/09/15: I have and examined Leonard [name] at bedside and reviewed his chart. Also consulted pulmonary. Appreciate help. Leonard [name] was admitted earlier this morning by Dr Leonard [name]. Please refer to his comprehensive H&P for details. Patient has left upper lobe pneumonia with question of left upper lobe collapse in setting of history of lymphoma in remission and recent incarceration. Question is whether he has a postobstructive process. Will follow pulmonary recommendations. 10/10/15: Appreciate pulmonary. Patient in airborne isolation, being investigated for TB. BP uncontrolled. Feels better but still has  chest pain. Will add Lisinopril, continue antibiotics and defer respiratory work up to pulmonary. 10/11/15: Appreciate pulmonary. Sputum AFB negative 1. BP uncontrolled but better, still has chest pain. Showering at the time of my visitation. Will continue current antibiotics/sputum AFBs and follow pulmonary recommendations. Will add Lasix. 10/12/15: Appreciate pulmonary. QuantiFERON results pending. Patient feels better. Only 1 AFB stain. Will continue current antibiotics and switch steroids to oral, follow QuantiFERON/AFB stain, if negative discharge home, hopefully in the next day or 2. 10/13/15: Feels better, await results of preliminary studies. He will need to follow with pcp .  Discharge Condition Stable.  Consults obtained  PCCM  Follow UP Follow-up Information    Follow up with Please use the resources provided to you in emergency room by case manager to assist with doctor for follow up .   Why:  Partnership for community care network you may contact them after 3 months residency Call Sylvie Farrier at [email or web address]   Contact information:   These [place] uninsured resources provide possible primary care providers, resources for discounted medications, housing, dental resources, affordable care act information, plus other resources for [employer]        Follow up with [facility].   Why:  please call to arrange appointment   Contact information:  [address] [place] [phone]        Discharge Instructions  and  Discharge Medications  Discharge Instructions    Diet - low sodium heart healthy    Complete by:  As directed      Discharge instructions    Complete by:  As directed   Please follow with your primary care provider to follow-up QuantiFERON gold results for TB     Increase activity slowly    Complete by:  As directed             Medication List     STOP taking these medications        pseudoephedrine-guaifenesin 60-600 MG 12 hr tablet  Commonly known as:  MUCINEX D      TAKE these medications        albuterol 108 (90 Base) MCG/ACT inhaler  Commonly known as:  PROVENTIL HFA;VENTOLIN HFA  Inhale 2 puffs into the lungs every 6 (six) hours as needed for wheezing or shortness of breath.     amLODipine 10 MG tablet  Commonly known as:  NORVASC  Take 10 mg by mouth daily.     amoxicillin-clavulanate 875-125 MG tablet  Commonly known as:  AUGMENTIN  Take 1 tablet by mouth 2 (two) times daily.     gabapentin 800 MG tablet  Commonly known as:  NEURONTIN  Take 800 mg by mouth 3 (three) times daily.     pantoprazole 40 MG tablet  Commonly known as:  PROTONIX  Take 40 mg by mouth daily.     predniSONE 10 MG tablet  Commonly known as:  DELTASONE  Please take 4 tablets daily for 2 days, and then 3 tablets daily for 2 days then 2 tablets daily for 2 days then 1 tablet daily for 2 days, total 20 tablets     SINUS CONGESTION/PAIN SEVERE PO  Take 1 tablet by mouth every 6 (six) hours as needed (congestion).     sucralfate 1 GM/10ML suspension  Commonly known as:  CARAFATE  Take 10 mLs (1 g total) by mouth 4 (four) times daily -  with meals and at bedtime.        Diet and Activity recommendation: See Discharge Instructions above  Major procedures and Radiology Reports - PLEASE review detailed and final reports for all details, in brief -    Dg Chest 2 View  10/08/2015  CLINICAL DATA:  Body ache, shortness of breath, fever and cough for 1 week. History of lymphoma, asthma and hypertension. EXAM: CHEST  2 VIEW COMPARISON:  None. FINDINGS: LEFT upper lobe atelectasis with air bronchograms. Strandy densities LEFT lung base. RIGHT lung is clear. No pleural effusion. No pneumothorax. Air distended proximal esophagus. Cardiac silhouette is normal. Soft tissue planes and included osseous structures are nonsuspicious. IMPRESSION:  Apparent LEFT upper lobe atelectasis versus scarring, with superimposed possible pneumonia. Recommend CT chest to assess for endobronchial lesion. LEFT lung base atelectasis/scarring. Electronically Signed   By: Elon Alas M.D.   On: 10/08/2015 23:18   Ct Chest Wo Contrast  10/09/2015  CLINICAL DATA:  Shortness of breath tonight. Increased congestion. Low oxygen saturation. Possible endobronchial lesion on chest radiograph. History of hypertension and lymphoma. EXAM: CT CHEST WITHOUT CONTRAST TECHNIQUE: Multidetector CT imaging of the chest was performed following the standard protocol without IV contrast. COMPARISON:  Chest radiograph 10/08/2015 FINDINGS: Volume loss and consolidation in the left upper lung with air bronchograms. Left upper lung bronchus is patent but  appears small. Changes are likely due to pneumonia but occult obstructing lesion is not excluded. Recommend follow-up imaging after resolution of acute process for further evaluation. Right lung is clear. Airways are patent. Normal heart size. Normal caliber thoracic aorta. No significant lymphadenopathy in the chest. Esophagus is decompressed. Included portions of the upper abdominal organs demonstrate a tiny low-attenuation lesion in the liver probably representing a cyst although too small to characterize. No destructive bone lesions. Calcification in the anterior mediastinum is of nonspecific etiology but appears benign. IMPRESSION: Collapse and consolidation of the left upper lung. This is likely to represent pneumonia but occult obstructing lesion is not excluded. Recommend followup after resolution of acute process. No mediastinal lymphadenopathy. Electronically Signed   By: Lucienne Capers M.D.   On: 10/09/2015 05:57   Dg Chest Port 1 View  10/11/2015  CLINICAL DATA:  Increasing upper mid chest pain. EXAM: PORTABLE CHEST 1 VIEW COMPARISON:  CT chest 10/09/2015.  Chest 10/08/2015. FINDINGS: Shallow inspiration. Normal heart size  and pulmonary vascularity. Left upper lung collapse and consolidation as seen on previous study. Consider pneumonia versus endobronchial obstructing lesion. Bronchoscopy may be useful in further evaluation if clinically indicated. Followup PA and lateral chest X-ray is recommended in 3-4 weeks following trial of antibiotic therapy to ensure resolution and exclude underlying malignancy. Right lung is clear. No blunting of costophrenic angles. No pneumothorax. IMPRESSION: Persistent collapse and consolidation in the left upper lung similar to previous study. Electronically Signed   By: Lucienne Capers M.D.   On: 10/11/2015 01:08    Micro Results   Recent Results (from the past 240 hour(s))  Culture, blood (routine x 2) Call MD if unable to obtain prior to antibiotics being given     Status: None (Preliminary result)   Collection Time: 10/09/15  9:45 AM  Result Value Ref Range Status   Specimen Description BLOOD LEFT FOREARM  Final   Special Requests BOTTLES DRAWN AEROBIC AND ANAEROBIC 5ML  Final   Culture   Final    NO GROWTH 3 DAYS Performed at [facility]    Report Status PENDING  Incomplete  Culture, blood (routine x 2) Call MD if unable to obtain prior to antibiotics being given     Status: None (Preliminary result)   Collection Time: 10/09/15  9:45 AM  Result Value Ref Range Status   Specimen Description BLOOD LEFT FOREARM  Final   Special Requests BOTTLES DRAWN AEROBIC AND ANAEROBIC 5ML  Final   Culture   Final    NO GROWTH 3 DAYS Performed at [facility]    Report Status PENDING  Incomplete  AFB culture with smear (NOT at [facility])     Status: None (Preliminary result)   Collection Time: 10/10/15  9:51 AM  Result Value Ref Range Status   Specimen Description SPUTUM  Final   Special Requests NONE  Final   Acid Fast Smear   Final    NO ACID FAST BACILLI SEEN Performed at [facility]    Culture   Final    CULTURE WILL BE EXAMINED FOR 6 WEEKS BEFORE ISSUING  A FINAL REPORT Performed at [facility]    Report Status PENDING  Incomplete       Today   Subjective:   Leonard [name] denies any complaints..   Objective:   Blood pressure 127/88, pulse 89, temperature 98.3 F (36.8 C), temperature source Oral, resp. rate 18, height 6' (1.829 m), weight 122.471 kg (270 lb), SpO2  96 %.   Intake/Output Summary (Last 24 hours) at 10/13/15 1708 Last data filed at 10/13/15 1230  Gross per 24 hour  Intake   1080 ml  Output      0 ml  Net   1080 ml    Exam   Data Review   CBC w Diff: Lab Results  Component Value Date   WBC 15.4* 10/13/2015   HGB 12.5* 10/13/2015   HCT 39.6 10/13/2015   PLT 401* 10/13/2015   LYMPHOPCT 18 10/09/2015   MONOPCT 5 10/09/2015   EOSPCT 0 10/09/2015   BASOPCT 0 10/09/2015    CMP: Lab Results  Component Value Date   NA 140 10/13/2015   K 3.8 10/13/2015   CL 105 10/13/2015   CO2 27 10/13/2015   BUN 31* 10/13/2015   CREATININE 1.05 10/13/2015  .   Total Time in preparing paper work, data evaluation and todays exam - 20 minutes  Kona Lover M.D on 10/13/2015 at 5:08 PM  [facility] Office  [phone]

## 2015-10-13 NOTE — Progress Notes (Signed)
Patient discharged to home, all discharge medications and instructions reviewed and questions answered.  Patient declines wheelchair assistance to vehicle states will ambulate.

## 2015-10-14 LAB — CULTURE, BLOOD (ROUTINE X 2)
CULTURE: NO GROWTH
CULTURE: NO GROWTH

## 2015-10-14 LAB — QUANTIFERON IN TUBE
QUANTIFERON MITOGEN VALUE: 2.73 [IU]/mL
QUANTIFERON TB AG VALUE: 0.04 IU/mL
QUANTIFERON TB GOLD: NEGATIVE
Quantiferon Nil Value: 0.05 IU/mL

## 2015-10-14 LAB — QUANTIFERON TB GOLD ASSAY (BLOOD)

## 2015-10-16 LAB — QUANTIFERON IN TUBE
QFT TB AG MINUS NIL VALUE: 0 [IU]/mL
QUANTIFERON MITOGEN VALUE: >10 IU/mL
QUANTIFERON TB AG VALUE: 0.03 IU/mL
QUANTIFERON TB GOLD: NEGATIVE
Quantiferon Nil Value: 0.03 IU/mL

## 2015-10-16 LAB — QUANTIFERON TB GOLD ASSAY (BLOOD)

## 2015-11-22 LAB — AFB CULTURE WITH SMEAR (NOT AT ARMC): ACID FAST SMEAR: NONE SEEN

## 2015-12-10 ENCOUNTER — Telehealth: Payer: Self-pay | Admitting: *Deleted

## 2015-12-10 NOTE — Telephone Encounter (Signed)
Unable to reach patient at time of pre-visit call. Left message for patient to return call when available.  

## 2015-12-11 ENCOUNTER — Encounter: Payer: Self-pay | Admitting: Family Medicine

## 2015-12-11 ENCOUNTER — Ambulatory Visit (INDEPENDENT_AMBULATORY_CARE_PROVIDER_SITE_OTHER): Payer: BLUE CROSS/BLUE SHIELD | Admitting: Family Medicine

## 2015-12-11 VITALS — BP 160/110 | HR 96 | Temp 98.4°F | Ht 71.0 in | Wt 272.0 lb

## 2015-12-11 DIAGNOSIS — G894 Chronic pain syndrome: Secondary | ICD-10-CM

## 2015-12-11 DIAGNOSIS — C8580 Other specified types of non-Hodgkin lymphoma, unspecified site: Secondary | ICD-10-CM

## 2015-12-11 DIAGNOSIS — J4540 Moderate persistent asthma, uncomplicated: Secondary | ICD-10-CM

## 2015-12-11 DIAGNOSIS — I10 Essential (primary) hypertension: Secondary | ICD-10-CM

## 2015-12-11 DIAGNOSIS — C8598 Non-Hodgkin lymphoma, unspecified, lymph nodes of multiple sites: Secondary | ICD-10-CM

## 2015-12-11 DIAGNOSIS — C8590 Non-Hodgkin lymphoma, unspecified, unspecified site: Secondary | ICD-10-CM | POA: Insufficient documentation

## 2015-12-11 DIAGNOSIS — J441 Chronic obstructive pulmonary disease with (acute) exacerbation: Secondary | ICD-10-CM

## 2015-12-11 DIAGNOSIS — K219 Gastro-esophageal reflux disease without esophagitis: Secondary | ICD-10-CM | POA: Diagnosis not present

## 2015-12-11 DIAGNOSIS — E038 Other specified hypothyroidism: Secondary | ICD-10-CM

## 2015-12-11 MED ORDER — ALBUTEROL SULFATE (2.5 MG/3ML) 0.083% IN NEBU: 2.5000 mg | INHALATION_SOLUTION | Freq: Four times a day (QID) | RESPIRATORY_TRACT | Status: DC | PRN

## 2015-12-11 MED ORDER — ALBUTEROL SULFATE HFA 108 (90 BASE) MCG/ACT IN AERS: 2.0000 | INHALATION_SPRAY | Freq: Four times a day (QID) | RESPIRATORY_TRACT | Status: DC | PRN

## 2015-12-11 MED ORDER — LEVOTHYROXINE SODIUM 50 MCG PO TABS: 50.0000 ug | ORAL_TABLET | Freq: Every day | ORAL | Status: DC

## 2015-12-11 MED ORDER — PANTOPRAZOLE SODIUM 40 MG PO TBEC: 40.0000 mg | DELAYED_RELEASE_TABLET | Freq: Every day | ORAL | Status: DC

## 2015-12-11 MED ORDER — GABAPENTIN 800 MG PO TABS: 800.0000 mg | ORAL_TABLET | Freq: Three times a day (TID) | ORAL | Status: DC

## 2015-12-11 MED ORDER — AMLODIPINE BESYLATE 10 MG PO TABS: 10.0000 mg | ORAL_TABLET | Freq: Every day | ORAL | Status: DC

## 2015-12-11 MED ORDER — MONTELUKAST SODIUM 10 MG PO TABS: 10.0000 mg | ORAL_TABLET | Freq: Every day | ORAL | Status: DC

## 2015-12-11 MED ORDER — HYDROCODONE-ACETAMINOPHEN 5-325 MG PO TABS: 1.0000 | ORAL_TABLET | ORAL | Status: DC | PRN

## 2015-12-11 NOTE — Patient Instructions (Signed)
Please check your BP at the drug store a few times while you are taking your amlodipine and let me know how it looks  I will refer you to pulmonology and oncology locally.  You can use the albuterol neb or inhaler as needed Please come and see me in about 2 months for a recheck I am glad to manage your chronic pain- we will have you sign a pain contract with [place] and will do urine drug screening periodically   Pain contract please

## 2015-12-11 NOTE — Progress Notes (Signed)
Pre visit review using our clinic tool,if applicable. No additional management support is needed unless otherwise documented below in the visit note.  

## 2015-12-11 NOTE — Progress Notes (Signed)
[place] at [facility] [address], [place], [place] [address] [phone]  Date:  12/11/2015   Name:  Leonard [name]   DOB:  10-25-1975   MRN:  [id number]  PCP:  Leonard Blinks, MD    Chief Complaint: Establish Care   History of Present Illness:  Leonard [name] is a 40 y.o. very pleasant male patient who presents with the following:  New patient today to establish care.  Recently moved here from [facility] where he was getting his care for cancer Dx with stage 4 non- Hodgkins lympohoma in 2003.  2 years of radiation and chemo, remission.    Radiation to his chest resulted in GERD, COPD, asthma. Also chronic pain.  Lymphoma recurred in 2014, stage 1 this time but a different cell type. Underwent another year of treatment.   He has had a lot of respiratory infections, asthma, breathing issues due to radiation.  May end up in the ER for a neb treatment when he has an exacerbation- would like to have a home neb machine  His primary [facility] oncologist moved away- he would like to see oncology here with [facility].  Also needs a pulmonologist to help manage his lung issues He has nerve pain in his chest and neck- uses neurontin and lortab for this.  On amlodipine for his HTN (he had been on clonidine but is not taking this now.)  He forgot to take his norvasc this am- he generally does take it in the morning.  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 Never a smoker but he does use snff  Patient Active Problem  List   Diagnosis Date Noted  . Abnormal chest x-ray 10/09/2015  . Acute exacerbation of COPD with asthma ([place]) 10/09/2015  . Asthma exacerbation 10/09/2015  . Pneumonia 10/09/2015  . Essential hypertension 10/09/2015    Past Medical History  Diagnosis Date  . Lymphoma ([place])     x 2  . Asthma   . Hypertension   . COPD (chronic obstructive pulmonary disease) ([place])   . Blood transfusion without reported diagnosis   . Sleep apnea   . Thyroid disease     Past Surgical History  Procedure Laterality Date  . Tumor biopsy    . Wisdom tooth extraction      Social History  Substance Use Topics  . Smoking status: Never Smoker   . Smokeless tobacco: Current User    Types: Snuff  . Alcohol Use: No    Family History  Problem Relation Age of Onset  . Cancer Mother   . Emphysema Mother   . Bronchitis Mother     No Known Allergies  Medication list has been reviewed and updated.  Current Outpatient Prescriptions on File Prior to Visit  Medication Sig Dispense Refill  . albuterol (PROVENTIL HFA;VENTOLIN HFA) 108 (90 Base) MCG/ACT inhaler Inhale 2 puffs into the lungs every 6 (six) hours as needed for wheezing or shortness of breath. 1 Inhaler  0  . amLODipine (NORVASC) 10 MG tablet Take 10 mg by mouth daily.    Marland Kitchen gabapentin (NEURONTIN) 800 MG tablet Take 800 mg by mouth 3 (three) times daily.    . pantoprazole (PROTONIX) 40 MG tablet Take 40 mg by mouth daily.    Marland Kitchen Phenylephrine-APAP-Guaifenesin (SINUS CONGESTION/PAIN SEVERE PO) Take 1 tablet by mouth every 6 (six) hours as needed (congestion).    . predniSONE (DELTASONE) 10 MG tablet Please take 4 tablets daily for 2 days, and then 3 tablets daily for 2 days then 2 tablets daily for 2 days then 1 tablet daily for 2 days, total 20 tablets 20 tablet 0  . sucralfate (CARAFATE) 1 GM/10ML suspension Take 10 mLs (1 g total) by mouth 4 (four) times daily -  with meals and at bedtime. 420 mL 0   No current facility-administered  medications on file prior to visit.    Review of Systems: He does not have any current unusual pains.  His breathing is at his baseline.  He uses advair for persistent asthma sx As per HPI- otherwise negative.   Physical Examination: Filed Vitals:   12/11/15 0943  BP: 150/110  Pulse: 105  Temp: 98.4 F (36.9 C)   Filed Vitals:   12/11/15 0943  Height: 5\' 11"  (1.803 m)  Weight: 272 lb (123.378 kg)   Body mass index is 37.95 kg/(m^2). Ideal Body Weight: Weight in (lb) to have BMI = 25: 178.9  GEN: WDWN, NAD, Non-toxic, A & O x 3, obese, otherwise looks well HEENT: Atraumatic, Normocephalic. Neck supple. No masses, No LAD. Ears and Nose: No external deformity. CV: RRR, No M/G/R. No JVD. No thrill. No extra heart sounds. PULM: CTA B, mild bilateral wheezing, nocrackles, rhonchi. No retractions. No resp. distress. No accessory muscle use. EXTR: No c/c/e NEURO Normal gait.  PSYCH: Normally interactive. Conversant. Not depressed or anxious appearing.  Calm demeanor.    Assessment and Plan: Non-Hodgkin's lymphoma of multiple sites [facility]) - Plan: Ambulatory referral to Oncology, Ambulatory referral to Pulmonology  Gastroesophageal reflux disease without esophagitis - Plan: pantoprazole (PROTONIX) 40 MG tablet  Chronic pain syndrome - Plan: gabapentin (NEURONTIN) 800 MG tablet, HYDROcodone-acetaminophen (LORTAB) 5-325 MG tablet  Asthma, moderate persistent, uncomplicated - Plan: Fluticasone-Salmeterol (ADVAIR) 250-50 MCG/DOSE AEPB, albuterol (PROVENTIL HFA;VENTOLIN HFA) 108 (90 Base) MCG/ACT inhaler, albuterol (PROVENTIL) (2.5 MG/3ML) 0.083% nebulizer solution  Essential hypertension - Plan: amLODipine (NORVASC) 10 MG tablet, montelukast (SINGULAIR) 10 MG tablet  Other specified hypothyroidism - Plan: levothyroxine (SYNTHROID, LEVOTHROID) 50 MCG tablet  COPD exacerbation ([place]) - Plan: Ambulatory referral to Pulmonology   Here today to establish care. Went over his medical  history- unfortunately he has had a lot of trouble for someone so young Refilled medications as above rx for neb machine to get from a medical supply store Referral to pulmonology and oncology HTN- he did not take his med today so we are not sure how it is working. He will check his BP a few times and give me an update in a couple of weeks Follow-up in 2 ,months Signed pain contract so I can manage his chronic pain   Signed Leonard Blinks, MD

## 2015-12-31 ENCOUNTER — Emergency Department (HOSPITAL_COMMUNITY): Payer: BLUE CROSS/BLUE SHIELD

## 2015-12-31 ENCOUNTER — Emergency Department (HOSPITAL_COMMUNITY)
Admission: EM | Admit: 2015-12-31 | Discharge: 2016-01-01 | Disposition: A | Discharge status: Home / Self Care | Payer: BLUE CROSS/BLUE SHIELD | Attending: Emergency Medicine | Admitting: Emergency Medicine

## 2015-12-31 ENCOUNTER — Encounter (HOSPITAL_COMMUNITY): Payer: Self-pay

## 2015-12-31 DIAGNOSIS — Z8572 Personal history of non-Hodgkin lymphomas: Secondary | ICD-10-CM | POA: Diagnosis not present

## 2015-12-31 DIAGNOSIS — E079 Disorder of thyroid, unspecified: Secondary | ICD-10-CM | POA: Insufficient documentation

## 2015-12-31 DIAGNOSIS — Z79899 Other long term (current) drug therapy: Secondary | ICD-10-CM | POA: Insufficient documentation

## 2015-12-31 DIAGNOSIS — J441 Chronic obstructive pulmonary disease with (acute) exacerbation: Secondary | ICD-10-CM | POA: Diagnosis not present

## 2015-12-31 DIAGNOSIS — Z7951 Long term (current) use of inhaled steroids: Secondary | ICD-10-CM | POA: Insufficient documentation

## 2015-12-31 DIAGNOSIS — M791 Myalgia: Secondary | ICD-10-CM | POA: Insufficient documentation

## 2015-12-31 DIAGNOSIS — I10 Essential (primary) hypertension: Secondary | ICD-10-CM | POA: Insufficient documentation

## 2015-12-31 DIAGNOSIS — J069 Acute upper respiratory infection, unspecified: Secondary | ICD-10-CM | POA: Diagnosis not present

## 2015-12-31 DIAGNOSIS — Z8669 Personal history of other diseases of the nervous system and sense organs: Secondary | ICD-10-CM | POA: Diagnosis not present

## 2015-12-31 DIAGNOSIS — R350 Frequency of micturition: Secondary | ICD-10-CM | POA: Diagnosis not present

## 2015-12-31 DIAGNOSIS — R05 Cough: Secondary | ICD-10-CM | POA: Diagnosis present

## 2015-12-31 LAB — RAPID STREP SCREEN (MED CTR MEBANE ONLY): STREPTOCOCCUS, GROUP A SCREEN (DIRECT): NEGATIVE

## 2015-12-31 MED ORDER — IPRATROPIUM BROMIDE 0.02 % IN SOLN
0.5000 mg | Freq: Once | RESPIRATORY_TRACT | Status: AC
  Administered 2015-12-31: 0.5 mg via RESPIRATORY_TRACT
  Filled 2015-12-31: qty 2.5

## 2015-12-31 MED ORDER — SODIUM CHLORIDE 0.9 % IV BOLUS (SEPSIS)
1000.0000 mL | Freq: Once | INTRAVENOUS | Status: AC
  Administered 2015-12-31: 1000 mL via INTRAVENOUS

## 2015-12-31 MED ORDER — ALBUTEROL SULFATE (2.5 MG/3ML) 0.083% IN NEBU
5.0000 mg | INHALATION_SOLUTION | Freq: Once | RESPIRATORY_TRACT | Status: AC
Start: 2015-12-31 — End: 2015-12-31
  Administered 2015-12-31: 5 mg via RESPIRATORY_TRACT
  Filled 2015-12-31: qty 6

## 2015-12-31 NOTE — ED Notes (Signed)
Pt here with c/o sore throat, generalized body aches, sweating, and fever. Temp was 100.7 at home and took tylenol at 1715. He reports coughing up dark brown sputum.

## 2015-12-31 NOTE — ED Provider Notes (Signed)
CSN: [id number]     Arrival date & time 12/31/15  1953 History   First MD Initiated Contact with Patient 12/31/15 2220     Chief Complaint  Patient presents with  . Generalized Body Aches    HPI   Leonard [name] is a 40 y.o. male with a PMH of lymphoma, asthma, HTN, COPD who presents to the ED with fever to 100.7, chills, generalized body aches, congestion, cough productive of brown sputum, and sore throat. He states his symptoms started last night and have progressively worsened throughout the day. He reports associated shortness of breath and wheezing, which he attributes to asthma. He states he was outside in the rain over the weekend and thinks this may have precipitated his symptoms. He has been taking tylenol cold and flu with no significant symptom relief. He reports history of lymphoma, though states he is currently in remission.    Past Medical History  Diagnosis Date  . Lymphoma ([place])     x 2  . Asthma   . Hypertension   . COPD (chronic obstructive pulmonary disease) ([place])   . Blood transfusion without reported diagnosis   . Sleep apnea   . Thyroid disease    Past Surgical History  Procedure Laterality Date  . Tumor biopsy    . Wisdom tooth extraction     Family History  Problem Relation Age of Onset  . Cancer Mother   . Emphysema Mother   . Bronchitis Mother    Social History  Substance Use Topics  . Smoking status: Never Smoker   . Smokeless tobacco: Current User    Types: Snuff  . Alcohol Use: No      Review of Systems  Constitutional: Positive for fever and chills.  HENT: Positive for congestion and sore throat. Negative for drooling and trouble swallowing.   Respiratory: Positive for cough and shortness of breath.   Cardiovascular: Negative for chest pain.  Gastrointestinal: Negative for nausea, vomiting, abdominal pain, diarrhea and constipation.  Genitourinary: Positive for frequency. Negative for dysuria and urgency.  All other systems reviewed and  are negative.     Allergies  Review of patient's allergies indicates no known allergies.  Home Medications   Prior to Admission medications   Medication Sig Start Date End Date Taking? Authorizing Provider  albuterol (PROVENTIL HFA;VENTOLIN HFA) 108 (90 Base) MCG/ACT inhaler Inhale 2 puffs into the lungs every 6 (six) hours as needed for wheezing or shortness of breath. 12/11/15  Yes Gay Filler Copland, MD  albuterol (PROVENTIL) (2.5 MG/3ML) 0.083% nebulizer solution Take 3 mLs (2.5 mg total) by nebulization every 6 (six) hours as needed for wheezing or shortness of breath. 12/11/15  Yes Gay Filler Copland, MD  amLODipine (NORVASC) 10 MG tablet Take 1 tablet (10 mg total) by mouth daily. 12/11/15  Yes Gay Filler Copland, MD  Fluticasone-Salmeterol (ADVAIR) 250-50 MCG/DOSE AEPB Inhale 2 puffs into the lungs 2 (two) times daily.   Yes Historical Provider, MD  gabapentin (NEURONTIN) 800 MG tablet Take 1 tablet (800 mg total) by mouth 3 (three) times daily. 12/11/15  Yes Gay Filler Copland, MD  levothyroxine (SYNTHROID, LEVOTHROID) 50 MCG tablet Take 1 tablet (50 mcg total) by mouth daily before breakfast. 12/11/15  Yes Jessica C Copland, MD  montelukast (SINGULAIR) 10 MG tablet Take 1 tablet (10 mg total) by mouth at bedtime. 12/11/15  Yes Gay Filler Copland, MD  pantoprazole (PROTONIX) 40 MG tablet Take 1 tablet (40 mg total) by mouth daily. 12/11/15  Yes  Gay Filler Copland, MD  acetaminophen (TYLENOL) 500 MG tablet Take 1 tablet (500 mg total) by mouth every 6 (six) hours as needed. 01/01/16   Marella Chimes, PA-C  benzonatate (TESSALON) 100 MG capsule Take 1 capsule (100 mg total) by mouth every 8 (eight) hours. 01/01/16   Marella Chimes, PA-C  HYDROcodone-acetaminophen (LORTAB) 5-325 MG tablet Take 1 tablet by mouth every 4 (four) hours as needed for moderate pain. Patient not taking: Reported on 12/31/2015 12/11/15   Gay Filler Copland, MD    BP 124/75 mmHg  Pulse 90  Temp(Src) 98.2 F (36.8  C) (Oral)  Resp 16  Ht 5\' 11"  (1.803 m)  Wt 122.471 kg  BMI 37.67 kg/m2  SpO2 97% Physical Exam  Constitutional: He is oriented to person, place, and time. He appears well-developed and well-nourished. No distress.  HENT:  Head: Normocephalic and atraumatic.  Right Ear: External ear normal.  Left Ear: External ear normal.  Nose: Nose normal.  Mouth/Throat: Uvula is midline, oropharynx is clear and moist and mucous membranes are normal.  Mild erythema to posterior oropharynx. No exudate. No abscess.  Eyes: Conjunctivae, EOM and lids are normal. Pupils are equal, round, and reactive to light. Right eye exhibits no discharge. Left eye exhibits no discharge. No scleral icterus.  Neck: Normal range of motion. Neck supple.  Cardiovascular: Normal rate, regular rhythm, normal heart sounds, intact distal pulses and normal pulses.   Pulmonary/Chest: Effort normal. No respiratory distress. He has wheezes. He has no rales.  Faint wheezing to lung fields bilaterally.  Abdominal: Soft. Normal appearance and bowel sounds are normal. He exhibits no distension and no mass. There is no tenderness. There is no rigidity, no rebound and no guarding.  Musculoskeletal: Normal range of motion. He exhibits no edema or tenderness.  Neurological: He is alert and oriented to person, place, and time.  Skin: Skin is warm, dry and intact. No rash noted. He is not diaphoretic. No erythema. No pallor.  Psychiatric: He has a normal mood and affect. His speech is normal and behavior is normal.  Nursing note and vitals reviewed.   ED Course  Procedures (including critical care time)  Labs Review Labs Reviewed  CBC WITH DIFFERENTIAL/PLATELET - Abnormal; Notable for the following:    Hemoglobin 11.7 (*)    HCT 38.3 (*)    MCH 25.8 (*)    All other components within normal limits  BASIC METABOLIC PANEL - Abnormal; Notable for the following:    Potassium 3.3 (*)    Creatinine, Ser 1.33 (*)    All other components  within normal limits  URINALYSIS, ROUTINE W REFLEX MICROSCOPIC (NOT AT [facility]) - Abnormal; Notable for the following:    Color, Urine AMBER (*)    Protein, ur 100 (*)    All other components within normal limits  URINE MICROSCOPIC-ADD ON - Abnormal; Notable for the following:    Squamous Epithelial / LPF 0-5 (*)    Bacteria, UA RARE (*)    Casts HYALINE CASTS (*)    All other components within normal limits  RAPID STREP SCREEN (NOT AT [facility])  CULTURE, GROUP A STREP [facility])    Imaging Review Dg Chest 2 View  12/31/2015  CLINICAL DATA:  Fever cough, body aches, sore throat and shortness of breath. EXAM: CHEST - 2 VIEW COMPARISON:  10/11/2015 and prior chest CT on 10/09/2015. FINDINGS: The heart size and mediastinal contours are within normal limits. Stable chronic atelectasis of the left upper lobe. Mild  bibasilar atelectasis/ scarring without focal infiltrate or edema. No pleural effusions. No pneumothorax. Bony structures are unremarkable. The visualized skeletal structures are unremarkable. IMPRESSION: Stable chronic atelectasis of the left upper lobe. Electronically Signed   By: Aletta Edouard M.D.   On: 12/31/2015 21:07   I have personally reviewed and evaluated these images and lab results as part of my medical decision-making.   EKG Interpretation None      MDM   Final diagnoses:  URI (upper respiratory infection)    40 year old male presents with fever to 100.7, chills, generalized body aches, congestion, cough productive of brown sputum, and sore throat. Also notes shortness of breath and wheezing, which he states is characteristic of his asthma.  Patient is afebrile. Vital signs stable. Mild erythema to posterior oropharynx. Heart RRR. Faint wheezing to lung fields bilaterally. Abdomen soft, non-tender, non-distended. No lower extremity edema. Patient moves all extremities without difficulty.  Rapid strep negative. CXR with stable chronic atelectasis of the left upper  lobe. Labs pending. Patient given breathing treatment and fluids.  CBC negative for leukocytosis, hemoglobin 11.7. BMP remarkable for potassium 3.3, patient given oral potassium in the ED, creatinine 1.33. UA negaitve for infection.   On reassessment of patient, he reports symptom improvement. He is nontoxic and well-appearing, feel he is stable for discharge at this time. Symptoms likely viral. Patient to follow up with PCP for recheck of renal function. Strict return precautions discussed. Patient verbalizes his understanding and is in agreement with plan.  BP 124/75 mmHg  Pulse 90  Temp(Src) 98.2 F (36.8 C) (Oral)  Resp 16  Ht 5\' 11"  (1.803 m)  Wt 122.471 kg  BMI 37.67 kg/m2  SpO2 97%     Marella Chimes, PA-C 01/01/16 [id number]  Varney Biles, MD 01/02/16 1520

## 2016-01-01 LAB — BASIC METABOLIC PANEL
Anion gap: 12 (ref 5–15)
BUN: 10 mg/dL (ref 6–20)
CALCIUM: 8.9 mg/dL (ref 8.9–10.3)
CO2: 26 mmol/L (ref 22–32)
CREATININE: 1.33 mg/dL — AB (ref 0.61–1.24)
Chloride: 103 mmol/L (ref 101–111)
GFR calc non Af Amer: >60 mL/min (ref >60)
Glucose, Bld: 95 mg/dL (ref 65–99)
Potassium: 3.3 mmol/L — ABNORMAL LOW (ref 3.5–5.1)
SODIUM: 141 mmol/L (ref 135–145)

## 2016-01-01 LAB — CBC WITH DIFFERENTIAL/PLATELET
BASOS PCT: 0 %
Basophils Absolute: 0 10*3/uL (ref 0.0–0.1)
EOS ABS: 0 10*3/uL (ref 0.0–0.7)
EOS PCT: 0 %
HCT: 38.3 % — ABNORMAL LOW (ref 39.0–52.0)
Hemoglobin: 11.7 g/dL — ABNORMAL LOW (ref 13.0–17.0)
LYMPHS ABS: 1.8 10*3/uL (ref 0.7–4.0)
Lymphocytes Relative: 23 %
MCH: 25.8 pg — AB (ref 26.0–34.0)
MCHC: 30.5 g/dL (ref 30.0–36.0)
MCV: 84.5 fL (ref 78.0–100.0)
MONO ABS: 0.9 10*3/uL (ref 0.1–1.0)
MONOS PCT: 11 %
NEUTROS PCT: 66 %
Neutro Abs: 5.3 10*3/uL (ref 1.7–7.7)
Platelets: 359 10*3/uL (ref 150–400)
RBC: 4.53 MIL/uL (ref 4.22–5.81)
RDW: 14.8 % (ref 11.5–15.5)
WBC: 8.1 10*3/uL (ref 4.0–10.5)

## 2016-01-01 LAB — URINALYSIS, ROUTINE W REFLEX MICROSCOPIC
BILIRUBIN URINE: NEGATIVE
Glucose, UA: NEGATIVE mg/dL
HGB URINE DIPSTICK: NEGATIVE
KETONES UR: NEGATIVE mg/dL
Leukocytes, UA: NEGATIVE
NITRITE: NEGATIVE
PH: 6.5 (ref 5.0–8.0)
Protein, ur: 100 mg/dL — AB
SPECIFIC GRAVITY, URINE: 1.027 (ref 1.005–1.030)

## 2016-01-01 LAB — URINE MICROSCOPIC-ADD ON

## 2016-01-01 MED ORDER — ACETAMINOPHEN 500 MG PO TABS: 500.0000 mg | ORAL_TABLET | Freq: Four times a day (QID) | ORAL | Status: DC | PRN

## 2016-01-01 MED ORDER — KETOROLAC TROMETHAMINE 30 MG/ML IJ SOLN: 30.0000 mg | Freq: Once | INTRAMUSCULAR | Status: DC

## 2016-01-01 MED ORDER — ACETAMINOPHEN 325 MG PO TABS
650.0000 mg | ORAL_TABLET | Freq: Once | ORAL | Status: AC
  Administered 2016-01-01: 650 mg via ORAL
  Filled 2016-01-01: qty 2

## 2016-01-01 MED ORDER — SODIUM CHLORIDE 0.9 % IV BOLUS (SEPSIS): 1000.0000 mL | Freq: Once | INTRAVENOUS | Status: DC

## 2016-01-01 MED ORDER — BENZONATATE 100 MG PO CAPS: 100.0000 mg | ORAL_CAPSULE | Freq: Three times a day (TID) | ORAL | Status: DC

## 2016-01-01 MED ORDER — POTASSIUM CHLORIDE CRYS ER 20 MEQ PO TBCR
40.0000 meq | EXTENDED_RELEASE_TABLET | Freq: Once | ORAL | Status: AC
  Administered 2016-01-01: 40 meq via ORAL
  Filled 2016-01-01: qty 2

## 2016-01-01 NOTE — Discharge Instructions (Signed)
1. Medications: tessalon, tylenol, usual home medications 2. Treatment: rest, drink plenty of fluids 3. Follow Up: please followup with your primary doctor for recheck of your kidney function and for discussion of your diagnoses and further evaluation after today's visit; please return to the ER for high fever, increased shortness of breath, new or worsening symptoms   Upper Respiratory Infection, Adult Most upper respiratory infections (URIs) are caused by a virus. A URI affects the nose, throat, and upper air passages. The most common type of URI is often called "the common cold." HOME CARE   Take medicines only as told by your doctor.  Gargle warm saltwater or take cough drops to comfort your throat as told by your doctor.  Use a warm mist humidifier or inhale steam from a shower to increase air moisture. This may make it easier to breathe.  Drink enough fluid to keep your pee (urine) clear or pale yellow.  Eat soups and other clear broths.  Have a healthy diet.  Rest as needed.  Go back to work when your fever is gone or your doctor says it is okay.  You may need to stay home longer to avoid giving your URI to others.  You can also wear a face mask and wash your hands often to prevent spread of the virus.  Use your inhaler more if you have asthma.  Do not use any tobacco products, including cigarettes, chewing tobacco, or electronic cigarettes. If you need help quitting, ask your doctor. GET HELP IF:  You are getting worse, not better.  Your symptoms are not helped by medicine.  You have chills.  You are getting more short of breath.  You have brown or red mucus.  You have yellow or brown discharge from your nose.  You have pain in your face, especially when you bend forward.  You have a fever.  You have puffy (swollen) neck glands.  You have pain while swallowing.  You have white areas in the back of your throat. GET HELP RIGHT AWAY IF:   You have very  bad or constant:  Headache.  Ear pain.  Pain in your forehead, behind your eyes, and over your cheekbones (sinus pain).  Chest pain.  You have long-lasting (chronic) lung disease and any of the following:  Wheezing.  Long-lasting cough.  Coughing up blood.  A change in your usual mucus.  You have a stiff neck.  You have changes in your:  Vision.  Hearing.  Thinking.  Mood. MAKE SURE YOU:   Understand these instructions.  Will watch your condition.  Will get help right away if you are not doing well or get worse.   This information is not intended to replace advice given to you by your health care provider. Make sure you discuss any questions you have with your health care provider.   Document Released: 01/27/2008 Document Revised: 12/25/2014 Document Reviewed: 11/15/2013 Elsevier Interactive Patient Education [employer].

## 2016-01-01 NOTE — ED Notes (Signed)
Pt given urinal to use.

## 2016-01-03 ENCOUNTER — Institutional Professional Consult (permissible substitution): Payer: BLUE CROSS/BLUE SHIELD | Admitting: Pulmonary Disease

## 2016-01-03 LAB — CULTURE, GROUP A STREP (THRC)

## 2016-01-10 ENCOUNTER — Ambulatory Visit: Payer: BLUE CROSS/BLUE SHIELD

## 2016-01-10 ENCOUNTER — Ambulatory Visit: Payer: BLUE CROSS/BLUE SHIELD | Admitting: Hematology & Oncology

## 2016-01-10 ENCOUNTER — Other Ambulatory Visit: Payer: BLUE CROSS/BLUE SHIELD

## 2016-01-11 ENCOUNTER — Other Ambulatory Visit: Payer: Self-pay | Admitting: Family Medicine

## 2016-01-11 DIAGNOSIS — G894 Chronic pain syndrome: Secondary | ICD-10-CM

## 2016-01-13 ENCOUNTER — Ambulatory Visit: Payer: BLUE CROSS/BLUE SHIELD

## 2016-01-13 ENCOUNTER — Ambulatory Visit: Payer: BLUE CROSS/BLUE SHIELD | Admitting: Hematology & Oncology

## 2016-01-13 ENCOUNTER — Other Ambulatory Visit: Payer: BLUE CROSS/BLUE SHIELD

## 2016-01-13 MED ORDER — HYDROCODONE-ACETAMINOPHEN 5-325 MG PO TABS: 1.0000 | ORAL_TABLET | ORAL | Status: DC | PRN

## 2016-01-13 NOTE — Addendum Note (Signed)
Addended by: Lamar Blinks C on: 01/13/2016 11:33 AM   Modules accepted: Orders

## 2016-01-13 NOTE — Telephone Encounter (Signed)
Checked [place]- nothing unexpected

## 2016-02-10 ENCOUNTER — Ambulatory Visit: Payer: BLUE CROSS/BLUE SHIELD

## 2016-02-10 ENCOUNTER — Other Ambulatory Visit: Payer: BLUE CROSS/BLUE SHIELD

## 2016-02-10 ENCOUNTER — Ambulatory Visit: Payer: BLUE CROSS/BLUE SHIELD | Admitting: Family Medicine

## 2016-02-10 ENCOUNTER — Encounter: Payer: BLUE CROSS/BLUE SHIELD | Admitting: Hematology & Oncology

## 2016-02-10 ENCOUNTER — Other Ambulatory Visit: Payer: Self-pay | Admitting: Family Medicine

## 2016-02-10 DIAGNOSIS — G894 Chronic pain syndrome: Secondary | ICD-10-CM

## 2016-02-10 NOTE — Addendum Note (Signed)
Addended by: Lamar Blinks C on: 02/10/2016 05:51 PM   Modules accepted: Orders

## 2016-02-11 ENCOUNTER — Institutional Professional Consult (permissible substitution): Payer: BLUE CROSS/BLUE SHIELD | Admitting: Pulmonary Disease

## 2016-02-15 NOTE — Progress Notes (Signed)
This encounter was created in error - please disregard.

## 2016-02-17 ENCOUNTER — Ambulatory Visit: Payer: BLUE CROSS/BLUE SHIELD | Admitting: Family Medicine

## 2016-02-17 ENCOUNTER — Telehealth: Payer: Self-pay | Admitting: Family Medicine

## 2016-02-17 NOTE — Telephone Encounter (Signed)
Patient cancelled 4:30pm appointment due to work conflict. Charge or no charge

## 2016-02-17 NOTE — Telephone Encounter (Signed)
No charge is fine- please send a letter reminding pt of cancellation policy

## 2016-02-18 ENCOUNTER — Encounter: Payer: Self-pay | Admitting: Family Medicine

## 2016-02-19 ENCOUNTER — Encounter: Payer: Self-pay | Admitting: Family Medicine

## 2016-03-03 ENCOUNTER — Encounter: Payer: Self-pay | Admitting: Family Medicine

## 2016-03-03 ENCOUNTER — Other Ambulatory Visit: Payer: Self-pay | Admitting: Family Medicine

## 2016-03-03 DIAGNOSIS — G894 Chronic pain syndrome: Secondary | ICD-10-CM

## 2016-03-04 ENCOUNTER — Ambulatory Visit: Payer: BLUE CROSS/BLUE SHIELD | Admitting: Family Medicine

## 2016-03-04 DIAGNOSIS — Z0289 Encounter for other administrative examinations: Secondary | ICD-10-CM

## 2016-03-04 MED ORDER — HYDROCODONE-ACETAMINOPHEN 5-325 MG PO TABS: 1.0000 | ORAL_TABLET | ORAL | Status: DC | PRN

## 2016-03-05 ENCOUNTER — Encounter: Payer: Self-pay | Admitting: Family Medicine

## 2016-03-05 ENCOUNTER — Other Ambulatory Visit: Payer: Self-pay | Admitting: Family Medicine

## 2016-04-05 ENCOUNTER — Emergency Department (HOSPITAL_COMMUNITY): Payer: Medicaid Other

## 2016-04-05 ENCOUNTER — Encounter (HOSPITAL_COMMUNITY): Payer: Self-pay | Admitting: Emergency Medicine

## 2016-04-05 ENCOUNTER — Inpatient Hospital Stay (HOSPITAL_COMMUNITY)
Admission: EM | Admit: 2016-04-05 | Discharge: 2016-04-08 | DRG: 293 | Disposition: A | Discharge status: Home / Self Care | Payer: Medicaid Other | Attending: Family Medicine | Admitting: Family Medicine

## 2016-04-05 DIAGNOSIS — Z79899 Other long term (current) drug therapy: Secondary | ICD-10-CM

## 2016-04-05 DIAGNOSIS — Z8572 Personal history of non-Hodgkin lymphomas: Secondary | ICD-10-CM

## 2016-04-05 DIAGNOSIS — G4730 Sleep apnea, unspecified: Secondary | ICD-10-CM | POA: Diagnosis present

## 2016-04-05 DIAGNOSIS — F17220 Nicotine dependence, chewing tobacco, uncomplicated: Secondary | ICD-10-CM | POA: Diagnosis present

## 2016-04-05 DIAGNOSIS — I509 Heart failure, unspecified: Secondary | ICD-10-CM

## 2016-04-05 DIAGNOSIS — I110 Hypertensive heart disease with heart failure: Secondary | ICD-10-CM | POA: Diagnosis present

## 2016-04-05 DIAGNOSIS — R0683 Snoring: Secondary | ICD-10-CM | POA: Diagnosis present

## 2016-04-05 DIAGNOSIS — J449 Chronic obstructive pulmonary disease, unspecified: Secondary | ICD-10-CM | POA: Diagnosis present

## 2016-04-05 DIAGNOSIS — R0602 Shortness of breath: Secondary | ICD-10-CM

## 2016-04-05 DIAGNOSIS — K219 Gastro-esophageal reflux disease without esophagitis: Secondary | ICD-10-CM | POA: Diagnosis present

## 2016-04-05 DIAGNOSIS — I5042 Chronic combined systolic (congestive) and diastolic (congestive) heart failure: Secondary | ICD-10-CM

## 2016-04-05 DIAGNOSIS — Z6834 Body mass index (BMI) 34.0-34.9, adult: Secondary | ICD-10-CM | POA: Diagnosis not present

## 2016-04-05 DIAGNOSIS — E669 Obesity, unspecified: Secondary | ICD-10-CM | POA: Diagnosis present

## 2016-04-05 DIAGNOSIS — E8770 Fluid overload, unspecified: Secondary | ICD-10-CM | POA: Diagnosis present

## 2016-04-05 DIAGNOSIS — R600 Localized edema: Secondary | ICD-10-CM

## 2016-04-05 DIAGNOSIS — I5041 Acute combined systolic (congestive) and diastolic (congestive) heart failure: Secondary | ICD-10-CM | POA: Diagnosis present

## 2016-04-05 DIAGNOSIS — D509 Iron deficiency anemia, unspecified: Secondary | ICD-10-CM | POA: Diagnosis present

## 2016-04-05 DIAGNOSIS — E039 Hypothyroidism, unspecified: Secondary | ICD-10-CM | POA: Diagnosis present

## 2016-04-05 DIAGNOSIS — I5043 Acute on chronic combined systolic (congestive) and diastolic (congestive) heart failure: Secondary | ICD-10-CM

## 2016-04-05 DIAGNOSIS — R609 Edema, unspecified: Secondary | ICD-10-CM

## 2016-04-05 DIAGNOSIS — I5021 Acute systolic (congestive) heart failure: Secondary | ICD-10-CM

## 2016-04-05 HISTORY — DX: Heart failure, unspecified: I50.9

## 2016-04-05 LAB — BASIC METABOLIC PANEL
Anion gap: 7 (ref 5–15)
BUN: 10 mg/dL (ref 6–20)
CHLORIDE: 105 mmol/L (ref 101–111)
CO2: 26 mmol/L (ref 22–32)
CREATININE: 1.05 mg/dL (ref 0.61–1.24)
Calcium: 8.8 mg/dL — ABNORMAL LOW (ref 8.9–10.3)
GFR calc Af Amer: >60 mL/min (ref >60)
GLUCOSE: 94 mg/dL (ref 65–99)
Potassium: 4.1 mmol/L (ref 3.5–5.1)
SODIUM: 138 mmol/L (ref 135–145)

## 2016-04-05 LAB — LIPASE, BLOOD: Lipase: 47 U/L (ref 11–51)

## 2016-04-05 LAB — BRAIN NATRIURETIC PEPTIDE: B NATRIURETIC PEPTIDE 5: 722.6 pg/mL — AB (ref 0.0–100.0)

## 2016-04-05 LAB — I-STAT TROPONIN, ED: Troponin i, poc: 0 ng/mL (ref 0.00–0.08)

## 2016-04-05 LAB — HEPATIC FUNCTION PANEL
ALT: 23 U/L (ref 17–63)
AST: 27 U/L (ref 15–41)
Albumin: 3.2 g/dL — ABNORMAL LOW (ref 3.5–5.0)
Alkaline Phosphatase: 91 U/L (ref 38–126)
BILIRUBIN INDIRECT: 0.3 mg/dL (ref 0.3–0.9)
Bilirubin, Direct: 0.1 mg/dL (ref 0.1–0.5)
TOTAL PROTEIN: 6.7 g/dL (ref 6.5–8.1)
Total Bilirubin: 0.4 mg/dL (ref 0.3–1.2)

## 2016-04-05 LAB — CBC
HCT: 33.2 % — ABNORMAL LOW (ref 39.0–52.0)
Hemoglobin: 9.8 g/dL — ABNORMAL LOW (ref 13.0–17.0)
MCH: 22.7 pg — ABNORMAL LOW (ref 26.0–34.0)
MCHC: 29.5 g/dL — ABNORMAL LOW (ref 30.0–36.0)
MCV: 76.9 fL — AB (ref 78.0–100.0)
PLATELETS: 377 10*3/uL (ref 150–400)
RBC: 4.32 MIL/uL (ref 4.22–5.81)
RDW: 17.1 % — AB (ref 11.5–15.5)
WBC: 6.8 10*3/uL (ref 4.0–10.5)

## 2016-04-05 LAB — D-DIMER, QUANTITATIVE (NOT AT ARMC): D DIMER QUANT: 0.28 ug{FEU}/mL (ref 0.00–0.50)

## 2016-04-05 MED ORDER — FUROSEMIDE 10 MG/ML IJ SOLN
20.0000 mg | Freq: Once | INTRAMUSCULAR | Status: AC
  Administered 2016-04-05: 20 mg via INTRAVENOUS
  Filled 2016-04-05: qty 2

## 2016-04-05 NOTE — ED Notes (Signed)
Pt c/o bilateral leg edema, worse at night. Mild redness noted. Pt also c/o SOB worse after eating or drinking. Reports using [employer] powders x 2 without relief. Pt appears to get SOB while talking [place] in [place]

## 2016-04-05 NOTE — ED Triage Notes (Signed)
Two weeks of SOB, bilateral leg swelling, abdominal distention if tries to drink water or eat. Nausea. Intermittent CP for a week.

## 2016-04-05 NOTE — ED Provider Notes (Signed)
[facility] DEPT Provider Note   CSN: [id number] Arrival date & time: 04/05/16  2108  First Provider Contact:  First MD Initiated Contact with Patient 04/05/16 2226        History   Chief Complaint Chief Complaint  Patient presents with  . Shortness of Breath  . Leg Swelling    HPI Leonard [name] is a 40 y.o. male.  Patient with history of lymphoma treated with chemotherapy on 2 separate occasions -- presents with complaint of one month history of lower extremity edema, abdominal distention, chills, shortness of breath. Patient describes severe abdominal distention after eating or drinking. This makes him nauseous. Occasionally he will vomit. He has decreased appetite and feels better when he doesn't eat for a period of time. Patient has had bouts of constipation recently. No diarrhea. No fevers or URI symptoms. No persisting cough. Patient is able to lie flat at night and does not wake up gasping for air. For the past 4 days his lower extremities have been very swollen and red at the end of the day. This is improved with elevation. No history of cardiac pulmonary disease. History of abdominal surgeries. Patient uses NSAIDs sparingly. Denies alcohol use. No reported cardiac complications from his previous chemotherapy. The onset of this condition was acute. The course is worsening. Aggravating factors: none. Alleviating factors: none.        Past Medical History:  Diagnosis Date  . Asthma   . Blood transfusion without reported diagnosis   . COPD (chronic obstructive pulmonary disease) ([place])   . Hypertension   . Lymphoma ([place])    x 2  . Sleep apnea   . Thyroid disease     Patient Active Problem List   Diagnosis Date Noted  . Lymphoma ([place]) 12/11/2015  . Abnormal chest x-ray 10/09/2015  . Acute exacerbation of COPD with asthma ([place]) 10/09/2015  . Asthma exacerbation 10/09/2015  . Pneumonia 10/09/2015  . Essential hypertension 10/09/2015    Past Surgical History:    Procedure Laterality Date  . tumor biopsy    . WISDOM TOOTH EXTRACTION         Home Medications    Prior to Admission medications   Medication Sig Start Date End Date Taking? Authorizing Provider  acetaminophen (TYLENOL) 500 MG tablet Take 1 tablet (500 mg total) by mouth every 6 (six) hours as needed. Patient taking differently: Take 1,000 mg by mouth every 6 (six) hours as needed (pain).  01/01/16  Yes Marella Chimes, PA-C  albuterol (PROVENTIL HFA;VENTOLIN HFA) 108 (90 Base) MCG/ACT inhaler Inhale 2 puffs into the lungs every 6 (six) hours as needed for wheezing or shortness of breath. 12/11/15  Yes Gay Filler Copland, MD  amLODipine (NORVASC) 10 MG tablet Take 1 tablet (10 mg total) by mouth daily. 12/11/15  Yes Gay Filler Copland, MD  Aspirin-Acetaminophen-Caffeine (GOODY HEADACHE PO) Take 1 packet by mouth 2 (two) times daily as needed (pain).   Yes Historical Provider, MD  Fluticasone-Salmeterol (ADVAIR) 250-50 MCG/DOSE AEPB Inhale 2 puffs into the lungs 2 (two) times daily.   Yes Historical Provider, MD  gabapentin (NEURONTIN) 800 MG tablet Take 1 tablet (800 mg total) by mouth 3 (three) times daily. 12/11/15  Yes Gay Filler Copland, MD  HYDROcodone-acetaminophen (LORTAB) 5-325 MG tablet Take 1 tablet by mouth every 4 (four) hours as needed for moderate pain. 03/04/16  Yes Gay Filler Copland, MD  levothyroxine (SYNTHROID, LEVOTHROID) 50 MCG tablet Take 1 tablet (50 mcg total) by mouth daily before breakfast.  12/11/15  Yes Jessica C Copland, MD  montelukast (SINGULAIR) 10 MG tablet Take 1 tablet (10 mg total) by mouth at bedtime. 12/11/15  Yes Gay Filler Copland, MD  pantoprazole (PROTONIX) 40 MG tablet Take 1 tablet (40 mg total) by mouth daily. 12/11/15  Yes Gay Filler Copland, MD  albuterol (PROVENTIL) (2.5 MG/3ML) 0.083% nebulizer solution Take 3 mLs (2.5 mg total) by nebulization every 6 (six) hours as needed for wheezing or shortness of breath. Patient not taking: Reported on 04/05/2016  12/11/15   Darreld Mclean, MD  benzonatate (TESSALON) 100 MG capsule Take 1 capsule (100 mg total) by mouth every 8 (eight) hours. Patient not taking: Reported on 04/05/2016 01/01/16   Marella Chimes, PA-C  HYDROcodone-acetaminophen (LORTAB) 5-325 MG tablet Take 1 tablet by mouth every 4 (four) hours as needed for moderate pain. Ok to fill 30 days after rx date Patient not taking: Reported on 04/05/2016 03/04/16   Darreld Mclean, MD    Family History Family History  Problem Relation Age of Onset  . Cancer Mother   . Emphysema Mother   . Bronchitis Mother     Social History Social History  Substance Use Topics  . Smoking status: Never Smoker  . Smokeless tobacco: Current User    Types: Snuff  . Alcohol use No     Allergies   Review of patient's allergies indicates no known allergies.   Review of Systems Review of Systems  Constitutional: Positive for chills and fatigue. Negative for diaphoresis, fever and unexpected weight change.  HENT: Negative for rhinorrhea and sore throat.   Eyes: Negative for redness.  Respiratory: Positive for shortness of breath. Negative for cough and wheezing.   Cardiovascular: Positive for leg swelling. Negative for chest pain and palpitations.  Gastrointestinal: Positive for abdominal pain, constipation, nausea and vomiting. Negative for diarrhea.  Genitourinary: Negative for dysuria.  Musculoskeletal: Negative for myalgias.  Skin: Positive for color change. Negative for rash.  Neurological: Negative for headaches.     Physical Exam Updated Vital Signs BP 147/100   Pulse 112   Temp 98.1 F (36.7 C) (Oral)   Resp 21   Ht 6' (1.829 m)   Wt 120.3 kg   SpO2 100%   BMI 35.97 kg/m   Physical Exam  Constitutional: He appears well-developed and well-nourished.  HENT:  Head: Normocephalic and atraumatic.  Mouth/Throat: Oropharynx is clear and moist.  Eyes: Conjunctivae are normal. Right eye exhibits no discharge. Left eye  exhibits no discharge.  Neck: Normal range of motion. Neck supple.  Cardiovascular: Regular rhythm and normal heart sounds.  Tachycardia present.   No murmur heard. Pulmonary/Chest: Effort normal and breath sounds normal. No respiratory distress. He has no wheezes. He has no rales.  Abdominal: Soft. He exhibits distension. He exhibits no mass. There is tenderness. There is no rebound and no guarding.  Patient with generalized tenderness of the abdomen with mild distention. Worse right upper quadrant and umbilical.  Musculoskeletal: He exhibits edema.  1+ pitting edema to the mid ankles with associated mild light erythema. No petechiae or rash.  Neurological: He is alert.  Skin: Skin is warm and dry.  Psychiatric: He has a normal mood and affect.  Nursing note and vitals reviewed.    ED Treatments / Results  Labs (all labs ordered are listed, but only abnormal results are displayed) Labs Reviewed  BASIC METABOLIC PANEL - Abnormal; Notable for the following:       Result Value   Calcium  8.8 (*)    All other components within normal limits  CBC - Abnormal; Notable for the following:    Hemoglobin 9.8 (*)    HCT 33.2 (*)    MCV 76.9 (*)    MCH 22.7 (*)    MCHC 29.5 (*)    RDW 17.1 (*)    All other components within normal limits  BRAIN NATRIURETIC PEPTIDE - Abnormal; Notable for the following:    B Natriuretic Peptide 722.6 (*)    All other components within normal limits  HEPATIC FUNCTION PANEL - Abnormal; Notable for the following:    Albumin 3.2 (*)    All other components within normal limits  D-DIMER, QUANTITATIVE (NOT AT [facility])  LIPASE, BLOOD  I-STAT TROPOININ, ED    EKG  EKG Interpretation  Date/Time:  Sunday April 05 2016 21:25:59 EDT Ventricular Rate:  116 PR Interval:  148 QRS Duration: 92 QT Interval:  348 QTC Calculation: 483 R Axis:   70 Text Interpretation:  Sinus tachycardia No significant change since last tracing Confirmed by Ashok Cordia  MD, Lennette Bihari ([id number])  on 04/05/2016 11:08:27 PM       Radiology Dg Chest 2 View  Result Date: 04/05/2016 CLINICAL DATA:  Acute onset of shortness of breath and bilateral leg swelling. Abdominal distention. Nausea. Intermittent chest pain. Initial encounter. EXAM: CHEST  2 VIEW COMPARISON:  Chest radiograph performed FINDINGS: The lungs are well-aerated. Vascular congestion is noted. Bibasilar airspace opacities may reflect mild interstitial edema. Prominent left apical airspace opacity is similar in appearance; underlying mass cannot be entirely excluded. There is no evidence of pleural effusion or pneumothorax. The heart is normal in size; the mediastinal contour is within normal limits. No acute osseous abnormalities are seen. IMPRESSION: 1. Vascular congestion noted. Bibasilar airspace opacities may reflect mild interstitial edema. 2. Similar appearance to prominent left apical airspace opacity. Underlying mass cannot be entirely excluded. PET/CT could be considered for further evaluation, as deemed clinically appropriate. Electronically Signed   By: Garald Balding M.D.   On: 04/05/2016 23:20    Procedures Procedures (including critical care time)  Medications Ordered in ED Medications  furosemide (LASIX) injection 20 mg (not administered)     Initial Impression / Assessment and Plan / ED Course  I have reviewed the triage vital signs and the nursing notes.  Pertinent labs & imaging results that were available during my care of the patient were reviewed by me and considered in my medical decision making (see chart for details).  Clinical Course   10:39 PM Patient seen and examined. Work-up initiated. No significant ascites noted on bedside ultrasound. Awaiting chest x-ray and additional labs. Will likely need further imaging.  Vital signs reviewed and are as follows: BP 147/100   Pulse 112   Temp 98.1 F (36.7 C) (Oral)   Resp 21   Ht 6' (1.829 m)   Wt 120.3 kg   SpO2 100%   BMI 35.97 kg/m    11:43 PM Patient discussed with and seen by Dr. Ashok Cordia. Will admit for new onset CHF. Patient updated. Somewhat reluctant because he has new job starting tomorrow. However agrees to admission. Hospitalist paged.   11:51 PM Spoke with Dr. Redmond Pulling who will admit.    Final Clinical Impressions(s) / ED Diagnoses   Final diagnoses:  Peripheral edema  Shortness of breath   Admit for eval SOB, edema -- possible CHF.   New Prescriptions New Prescriptions   No medications on file     Carlisle Cater,  PA-C 04/05/16 [place], MD 04/05/16 [phone]

## 2016-04-06 DIAGNOSIS — R0602 Shortness of breath: Secondary | ICD-10-CM

## 2016-04-06 DIAGNOSIS — R609 Edema, unspecified: Secondary | ICD-10-CM

## 2016-04-06 DIAGNOSIS — E8770 Fluid overload, unspecified: Secondary | ICD-10-CM | POA: Diagnosis present

## 2016-04-06 LAB — URINALYSIS, ROUTINE W REFLEX MICROSCOPIC
Bilirubin Urine: NEGATIVE
Glucose, UA: NEGATIVE mg/dL
Hgb urine dipstick: NEGATIVE
Ketones, ur: NEGATIVE mg/dL
LEUKOCYTES UA: NEGATIVE
NITRITE: NEGATIVE
PH: 7 (ref 5.0–8.0)
Protein, ur: NEGATIVE mg/dL
SPECIFIC GRAVITY, URINE: 1.008 (ref 1.005–1.030)

## 2016-04-06 LAB — CBC
HCT: 34.7 % — ABNORMAL LOW (ref 39.0–52.0)
Hemoglobin: 10.2 g/dL — ABNORMAL LOW (ref 13.0–17.0)
MCH: 22.3 pg — AB (ref 26.0–34.0)
MCHC: 29.4 g/dL — ABNORMAL LOW (ref 30.0–36.0)
MCV: 75.8 fL — AB (ref 78.0–100.0)
PLATELETS: 412 10*3/uL — AB (ref 150–400)
RBC: 4.58 MIL/uL (ref 4.22–5.81)
RDW: 17.3 % — AB (ref 11.5–15.5)
WBC: 7.6 10*3/uL (ref 4.0–10.5)

## 2016-04-06 LAB — LIPID PANEL
CHOLESTEROL: 131 mg/dL (ref 0–200)
HDL: 24 mg/dL — ABNORMAL LOW (ref >40)
LDL Cholesterol: 82 mg/dL (ref 0–99)
Total CHOL/HDL Ratio: 5.5 RATIO
Triglycerides: 127 mg/dL (ref <150)
VLDL: 25 mg/dL (ref 0–40)

## 2016-04-06 LAB — MAGNESIUM: Magnesium: 1.9 mg/dL (ref 1.7–2.4)

## 2016-04-06 LAB — RETICULOCYTES
RBC.: 4.51 MIL/uL (ref 4.22–5.81)
RETIC COUNT ABSOLUTE: 85.7 10*3/uL (ref 19.0–186.0)
Retic Ct Pct: 1.9 % (ref 0.4–3.1)

## 2016-04-06 LAB — VITAMIN B12: Vitamin B-12: 485 pg/mL (ref 180–914)

## 2016-04-06 LAB — CREATININE, SERUM
CREATININE: 1.05 mg/dL (ref 0.61–1.24)
GFR calc Af Amer: >60 mL/min (ref >60)
GFR calc non Af Amer: >60 mL/min (ref >60)

## 2016-04-06 LAB — PROTIME-INR
INR: 1.27
PROTHROMBIN TIME: 16 s — AB (ref 11.4–15.2)

## 2016-04-06 LAB — TSH: TSH: 6.211 u[IU]/mL — ABNORMAL HIGH (ref 0.350–4.500)

## 2016-04-06 LAB — IRON AND TIBC
Iron: 27 ug/dL — ABNORMAL LOW (ref 45–182)
SATURATION RATIOS: 5 % — AB (ref 17.9–39.5)
TIBC: 496 ug/dL — ABNORMAL HIGH (ref 250–450)
UIBC: 469 ug/dL

## 2016-04-06 LAB — HIV ANTIBODY (ROUTINE TESTING W REFLEX): HIV SCREEN 4TH GENERATION: NONREACTIVE

## 2016-04-06 LAB — LACTATE DEHYDROGENASE: LDH: 198 U/L — AB (ref 98–192)

## 2016-04-06 LAB — FERRITIN: Ferritin: 13 ng/mL — ABNORMAL LOW (ref 24–336)

## 2016-04-06 MED ORDER — LEVOTHYROXINE SODIUM 50 MCG PO TABS
50.0000 ug | ORAL_TABLET | Freq: Every day | ORAL | Status: DC
  Administered 2016-04-06 – 2016-04-08 (×3): 50 ug via ORAL
  Filled 2016-04-06 (×4): qty 1

## 2016-04-06 MED ORDER — SODIUM CHLORIDE 0.9 % IV SOLN: 250.0000 mL | INTRAVENOUS | Status: DC | PRN

## 2016-04-06 MED ORDER — MONTELUKAST SODIUM 10 MG PO TABS
10.0000 mg | ORAL_TABLET | Freq: Every day | ORAL | Status: DC
  Administered 2016-04-06 – 2016-04-07 (×3): 10 mg via ORAL
  Filled 2016-04-06 (×3): qty 1

## 2016-04-06 MED ORDER — ALBUTEROL SULFATE (2.5 MG/3ML) 0.083% IN NEBU: 2.5000 mg | INHALATION_SOLUTION | Freq: Four times a day (QID) | RESPIRATORY_TRACT | Status: DC | PRN

## 2016-04-06 MED ORDER — ACETAMINOPHEN 500 MG PO TABS: 500.0000 mg | ORAL_TABLET | Freq: Four times a day (QID) | ORAL | Status: DC | PRN

## 2016-04-06 MED ORDER — FUROSEMIDE 10 MG/ML IJ SOLN
20.0000 mg | Freq: Two times a day (BID) | INTRAMUSCULAR | Status: DC
  Administered 2016-04-06 – 2016-04-07 (×3): 20 mg via INTRAVENOUS
  Filled 2016-04-06 (×4): qty 2

## 2016-04-06 MED ORDER — AMLODIPINE BESYLATE 10 MG PO TABS
10.0000 mg | ORAL_TABLET | Freq: Every day | ORAL | Status: DC
  Administered 2016-04-06 – 2016-04-07 (×2): 10 mg via ORAL
  Filled 2016-04-06 (×2): qty 1

## 2016-04-06 MED ORDER — SODIUM CHLORIDE 0.9% FLUSH: 3.0000 mL | INTRAVENOUS | Status: DC | PRN

## 2016-04-06 MED ORDER — PANTOPRAZOLE SODIUM 40 MG PO TBEC
40.0000 mg | DELAYED_RELEASE_TABLET | Freq: Every day | ORAL | Status: DC
Start: 2016-04-06 — End: 2016-04-08
  Administered 2016-04-06 – 2016-04-08 (×3): 40 mg via ORAL
  Filled 2016-04-06 (×3): qty 1

## 2016-04-06 MED ORDER — SODIUM CHLORIDE 0.9% FLUSH
3.0000 mL | Freq: Two times a day (BID) | INTRAVENOUS | Status: DC
  Administered 2016-04-06 – 2016-04-08 (×6): 3 mL via INTRAVENOUS

## 2016-04-06 MED ORDER — MOMETASONE FURO-FORMOTEROL FUM 200-5 MCG/ACT IN AERO
2.0000 | INHALATION_SPRAY | Freq: Two times a day (BID) | RESPIRATORY_TRACT | Status: DC
  Administered 2016-04-06 – 2016-04-08 (×4): 2 via RESPIRATORY_TRACT
  Filled 2016-04-06 (×2): qty 8.8

## 2016-04-06 MED ORDER — GABAPENTIN 400 MG PO CAPS
800.0000 mg | ORAL_CAPSULE | Freq: Three times a day (TID) | ORAL | Status: DC
  Administered 2016-04-06 – 2016-04-08 (×8): 800 mg via ORAL
  Filled 2016-04-06 (×9): qty 2

## 2016-04-06 MED ORDER — ONDANSETRON HCL 4 MG/2ML IJ SOLN: 4.0000 mg | Freq: Four times a day (QID) | INTRAMUSCULAR | Status: DC | PRN

## 2016-04-06 MED ORDER — ENOXAPARIN SODIUM 60 MG/0.6ML ~~LOC~~ SOLN
60.0000 mg | SUBCUTANEOUS | Status: DC
  Administered 2016-04-06 – 2016-04-08 (×3): 60 mg via SUBCUTANEOUS
  Filled 2016-04-06 (×3): qty 0.6

## 2016-04-06 NOTE — H&P (Signed)
History and Physical   Leonard [name] [id number] DOB: Mar 31, 1976 DOA: 04/05/2016  PCP: Lamar Blinks, MD  Chief Complaint: Shortness of breath  HPI:  Leonard [name] is a 40 year old Caucasian male past medical history significant for non-Hodgkin's lymphoma status post chemoradiation at [facility]-currently in remission), essential hypertension, asthma, GERD, and chronic pain. Patient presents to the emergency department on 8/14/6 2017 complaining of shortness of breath, and lower extremity edema. Patient states his symptoms and present for the preceding 2 months, but have gradually worsened over the past 2 weeks. He notes orthopnea, but only uses one pillow at night. He notes abdominal distention. No history of heart disease. Patient denies any recent changes in medications, including his levothyroxine, which is prescribed for hypothyroidism. No fever chills at home. No sick contacts. Patient states his asthma is "fairly well controlled" and denies recent prednisone use. No blood loss noted at home.   ED Course:  Patient was hemogram with a stable upon presentation to the emergency department. He was noted to be tachycardic with heart rate in the low 100s. EKG showed sinus tachycardia. Lab work was notable for microcytic anemia, negative troponin, normal renal function, albumin 3.2, negative d-dimer, and elevated BNP at 722. Chest x-ray demonstrated vascular congestion. Patient was given 20 mg IV furosemide.  Review of Systems: A complete ROS was obtained; pertinent positives negatives are denoted in the HPI. Otherwise, all systems are negative.   Past Medical History:  Diagnosis Date  . Asthma   . Blood transfusion without reported diagnosis   . CHF (congestive heart failure) ([place]) 04/05/2016  . COPD (chronic obstructive pulmonary disease) ([place])   . Hypertension   . Lymphoma ([place])    x 2  . Sleep apnea   . Thyroid disease    Social History   Social History  . Marital status: Single    Spouse  name: N/A  . Number of children: N/A  . Years of education: N/A   Occupational History  . Not on file.   Social History Main Topics  . Smoking status: Never Smoker  . Smokeless tobacco: Current User    Types: Snuff  . Alcohol use No  . Drug use: No  . Sexual activity: Yes   Other Topics Concern  . Not on file   Social History Narrative  . No narrative on file   Family History  Problem Relation Age of Onset  . Cancer Mother   . Emphysema Mother   . Bronchitis Mother     Physical Exam: Vitals:   04/05/16 2125 04/05/16 2215  BP: (!) 160/103 147/100  Pulse: 116 112  Resp: 20 21  Temp: 98.1 F (36.7 C)   TempSrc: Oral   SpO2: 97% 100%  Weight: 120.3 kg (265 lb 4 oz)   Height: 6' (1.829 m)    General: Appears mildly anxious. ENT: Grossly normal hearing, MMM. Cardiovascular: Tachy, but regular. 2+ pitting edema bilaterally to mid calf. Respiratory: CTA bilaterally. No wheezes or crackles. Normal respiratory effort. Abdomen: Soft, non-tender. Bowel sounds present.  Skin: Faint and diffuse macular rash throughout back Musculoskeletal: Grossly normal tone BUE/BLE. Appropriate ROM.  Psychiatric: Grossly normal mood and affect. Neurologic: Moves all extremities in coordinated fashion.  I have personally reviewed the following labs, culture data, and imaging studies.  Culture: N/A  Radiology: Chest x-ray reviewed and demonstrated pulmonary vascular congestion.  Assessment/Plan: Leonard [name] is a 40 year old Caucasian male with past medical history significant for non-Hodgkin's lymphoma status post chemoradiation (currently in remission) disease, obesity,  asthma, essential hypertension. Patient presented to the emergency department complaining of lower extremity edema and shortness of breath.  1. Hypervolemia Patient presented to the nutrition department in a hemodynamically stable condition, but was noted to be volume overloaded based on physical exam, laboratory  studies, and chest x-ray showing vascular congestion. At this point time, the differential diagnosis is quite broad, and includes heart failure with reduced ejection fraction, pulmonary hypertension from untreated obstructive sleep apnea, nephrotic syndrome given lower extremity edema and decreased albumin. The negative d-dimer excludes a pulmonary embolus. The patient's history of chemoradiation also raises the possibility of a toxicity induced cardiomyopathy. Patient believes he was treated with R-CHOP regimen for lymphoma, though the records from [facility] are not available at this time. The sinus tachycardia is likely from underlying hypervolemia. The plan is to admit the patient to telemetry unit for further monitoring. Patient was given 20 mg furosemide in the emergency department. This is an appropriate dose given the fact he is Lasix nave. We will need to keep strict I's and O's to monitor volume status. Patient states he is amenable to urinating in a container to accurately measure urinary output. No need for a Foley catheter at this point time. Patient will certainly need a TTE in the morning to evaluate cardiac anatomy and ejection fraction. In the interim, I will check urinalysis and protein to creatinine ratio, HIV, hepatitis panel. I will also check a TSH to ensure the suspected cardiomyopathy is not thyroid mediated. I will also check a lipid profile, as nephrotic syndrome is on the differential diagnosis. Patient may also benefit from a sleep study in the outpatient setting to evaluate suspected underlying OSA.  2. Microcytic anemia Patient's hemoglobin is lower than baseline. He denies any bleeding at home. To further evaluate this, I will order iron profile, B12, folate. We wiill recheck a CBC in the morning. There is no need for a transfusion. Transfusion threshold will be 7 g/dL.  3. Essential hypertension Patient is mildly hypertensive in the emergency department, though this is likely  multifactorial. Will continue patient's home amlodipine. Certainly, the patient is found to have new onset heart failure, he would benefit from ACE inhibitor.  4. Asthma Patient has follow-up with pulmonary medicine the past. We will continue the home regimen of albuterol inhaler, and inhaled corticosteroid/long-acting beta agonist.  5. GERD This appears stable. We will continue home PPI.  DVT prophylaxis: Lovenox Code Status: Full Code Disposition Plan: Anticipate D/C home in 2-5 days Consults called: Will need cardiology consult on the morning Admission status: Inpatient on telemetry unit  Clydene Laming, MD [facility] Page:[phone]  If 7PM-7AM, please contact night-coverage [email or web address] Password [email or web address]

## 2016-04-06 NOTE — Progress Notes (Signed)
Subjective: Patient admitted this morning, see detailed H&P by Dr Clydene Laming 40 year old Caucasian male past medical history significant for non-Hodgkin's lymphoma status post chemoradiation at [facility]-currently in remission), essential hypertension, asthma, GERD, and chronic pain. Patient presents to the emergency department on 8/14/6 2017 complaining of shortness of breath, and lower extremity edema. Patient states his symptoms and present for the preceding 2 months, but have gradually worsened over the past 2 weeks. He notes orthopnea, but only uses one pillow at night. He notes abdominal distention. No history of heart disease. Patient denies any recent changes in medications, including his levothyroxine, which is prescribed for hypothyroidism. No fever chills at home. No sick contacts. Patient states his asthma is "fairly well controlled" and denies recent prednisone use. No blood loss noted at home.   Patient had good diuresis with IV Lasix. Denies shortness over this morning Vitals:   04/06/16 0523 04/06/16 1208  BP: (!) 150/93 (!) 145/86  Pulse: (!) 104 (!) 110  Resp: 18 18  Temp: 98.2 F (36.8 C) 97.5 F (36.4 C)    Chest: Bibasilar rhonchi Heart : S1S2 RRR Abdomen: Soft, nontender Ext : No edema Neuro: Alert, oriented x 3  A/P Bilateral lower extremity edema Microcytic anemia Essential hypertension Asthma TSH elevation  Continue IV Lasix Echocardiogram pending Will follow   Oswald Hillock [facility] [phone]

## 2016-04-07 ENCOUNTER — Inpatient Hospital Stay (HOSPITAL_COMMUNITY): Payer: Medicaid Other

## 2016-04-07 ENCOUNTER — Encounter (HOSPITAL_COMMUNITY): Payer: Self-pay | Admitting: Student

## 2016-04-07 DIAGNOSIS — I5023 Acute on chronic systolic (congestive) heart failure: Secondary | ICD-10-CM

## 2016-04-07 DIAGNOSIS — I509 Heart failure, unspecified: Secondary | ICD-10-CM

## 2016-04-07 DIAGNOSIS — I5021 Acute systolic (congestive) heart failure: Secondary | ICD-10-CM

## 2016-04-07 DIAGNOSIS — I10 Essential (primary) hypertension: Secondary | ICD-10-CM

## 2016-04-07 LAB — CBC
HCT: 35.3 % — ABNORMAL LOW (ref 39.0–52.0)
Hemoglobin: 10.5 g/dL — ABNORMAL LOW (ref 13.0–17.0)
MCH: 22.5 pg — ABNORMAL LOW (ref 26.0–34.0)
MCHC: 29.7 g/dL — ABNORMAL LOW (ref 30.0–36.0)
MCV: 75.8 fL — ABNORMAL LOW (ref 78.0–100.0)
PLATELETS: 407 10*3/uL — AB (ref 150–400)
RBC: 4.66 MIL/uL (ref 4.22–5.81)
RDW: 17.2 % — AB (ref 11.5–15.5)
WBC: 6.8 10*3/uL (ref 4.0–10.5)

## 2016-04-07 LAB — HEPATITIS PANEL, ACUTE
HCV AB: 0.1 {s_co_ratio} (ref 0.0–0.9)
HEP A IGM: NEGATIVE
HEP B C IGM: NEGATIVE
Hepatitis B Surface Ag: NEGATIVE

## 2016-04-07 LAB — HAPTOGLOBIN: HAPTOGLOBIN: 192 mg/dL (ref 34–200)

## 2016-04-07 LAB — BASIC METABOLIC PANEL
Anion gap: 7 (ref 5–15)
BUN: 11 mg/dL (ref 6–20)
CHLORIDE: 104 mmol/L (ref 101–111)
CO2: 29 mmol/L (ref 22–32)
CREATININE: 1.02 mg/dL (ref 0.61–1.24)
Calcium: 8.9 mg/dL (ref 8.9–10.3)
GFR calc non Af Amer: >60 mL/min (ref >60)
Glucose, Bld: 119 mg/dL — ABNORMAL HIGH (ref 65–99)
POTASSIUM: 4.1 mmol/L (ref 3.5–5.1)
SODIUM: 140 mmol/L (ref 135–145)

## 2016-04-07 LAB — FOLATE RBC
Folate, Hemolysate: 563.4 ng/mL
Folate, RBC: 1662 ng/mL (ref >498)
Hematocrit: 33.9 % — ABNORMAL LOW (ref 37.5–51.0)

## 2016-04-07 LAB — MAGNESIUM: Magnesium: 2.3 mg/dL (ref 1.7–2.4)

## 2016-04-07 LAB — ECHOCARDIOGRAM COMPLETE
Height: 72 in
WEIGHTICAEL: 4099.2 [oz_av]

## 2016-04-07 MED ORDER — FUROSEMIDE 10 MG/ML IJ SOLN
40.0000 mg | Freq: Two times a day (BID) | INTRAMUSCULAR | Status: DC
  Administered 2016-04-07 – 2016-04-08 (×2): 40 mg via INTRAVENOUS
  Filled 2016-04-07 (×2): qty 4

## 2016-04-07 MED ORDER — SPIRONOLACTONE 25 MG PO TABS
12.5000 mg | ORAL_TABLET | Freq: Every day | ORAL | Status: DC
  Administered 2016-04-07 – 2016-04-08 (×2): 12.5 mg via ORAL
  Filled 2016-04-07 (×2): qty 1

## 2016-04-07 MED ORDER — CARVEDILOL 3.125 MG PO TABS
3.1250 mg | ORAL_TABLET | Freq: Two times a day (BID) | ORAL | Status: DC
  Administered 2016-04-07 – 2016-04-08 (×2): 3.125 mg via ORAL
  Filled 2016-04-07 (×2): qty 1

## 2016-04-07 MED ORDER — FERROUS SULFATE 325 (65 FE) MG PO TABS
325.0000 mg | ORAL_TABLET | Freq: Three times a day (TID) | ORAL | Status: DC
  Administered 2016-04-07 – 2016-04-08 (×3): 325 mg via ORAL
  Filled 2016-04-07 (×4): qty 1

## 2016-04-07 MED ORDER — SACUBITRIL-VALSARTAN 24-26 MG PO TABS
1.0000 | ORAL_TABLET | Freq: Two times a day (BID) | ORAL | Status: DC
  Administered 2016-04-07 – 2016-04-08 (×2): 1 via ORAL
  Filled 2016-04-07 (×2): qty 1

## 2016-04-07 MED ORDER — PERFLUTREN LIPID MICROSPHERE
1.0000 mL | INTRAVENOUS | Status: AC | PRN
  Administered 2016-04-07: 2 mL via INTRAVENOUS
  Filled 2016-04-07: qty 10

## 2016-04-07 NOTE — Progress Notes (Signed)
[facility]  PROGRESS NOTE  Leonard [name] [id number] DOB: 1976-04-14 DOA: 04/05/2016 PCP: Lamar Blinks, MD    Brief HPI:   40 year old Caucasian male past medical history significant for non-Hodgkin's lymphoma status post chemoradiation at [facility]-currently in remission), essential hypertension, asthma, GERD, and chronic pain. Patient presents to the emergency department on 8/14/6 2017 complaining of shortness of breath, and lower extremity edema. Patient states his symptoms and present for the preceding 2 months, but have gradually worsened over the past 2 weeks. He notes orthopnea, but only uses one pillow at night. He notes abdominal distention. No history of heart disease. Patient denies any recent changes in medications, including his levothyroxine, which is prescribed for hypothyroidism. No fever chills at home. No sick contacts. Patient states his asthma is "fairly well controlled" and denies recent prednisone use. No blood loss noted at home.      Assessment/Plan:    1. 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 Cause, ischemic /NICM/? R- CHOP side effect. Will consult cardiology for further recommendations. 2. Microcytic anemia- B12 level is 485, Iron level is 27. Will start ferrous sulfate, check stool for occult blood. 3. Hypertension- will discontinue amlodipine and start Coreg 3.125 mg po bid 4. Hypothyroidism- continue synthroid, check TSH   DVT prophylaxis: Lovenox Code Status: Full code Family Communication: No family at bedside Disposition Plan: Home when work up for  New onset chf complete   Consultants:  None   Procedures: Echocardiogram There was mild   concentric hypertrophy. Systolic function was severely reduced.   The estimated ejection fraction was in the range of 25% to 30%.   There is akinesis of the anteroseptal and inferoseptal   myocardium. Features are consistent with a pseudonormal left   ventricular filling pattern, with  concomitant abnormal relaxation   and increased filling pressure (grade 2 diastolic dysfunction).  - Aortic valve: Trileaflet; mildly thickened, mildly calcified  Antibiotics:  None   Subjective   Patient seen and examined, admitted with bilateral lower extremity edema, echocardiogram showed EF 25-30%. He was diuresed with IV Lasix, and has improved.   Objective    Objective: Vitals:   04/06/16 2033 04/07/16 0545 04/07/16 0831 04/07/16 1129  BP:  (!) 138/96  (!) 158/97  Pulse:  98  (!) 113  Resp:  18  18  Temp:  98.2 F (36.8 C)  97.6 F (36.4 C)  TempSrc:  Oral  Oral  SpO2: 98% 97% 97% 99%  Weight:  116.2 kg (256 lb 3.2 oz)    Height:        Intake/Output Summary (Last 24 hours) at 04/07/16 1412 Last data filed at 04/07/16 1356  Gross per 24 hour  Intake             1160 ml  Output             2025 ml  Net             -865 ml   Filed Weights   04/06/16 0127 04/06/16 0523 04/07/16 0545  Weight: 117.3 kg (258 lb 9.6 oz) 117 kg (257 lb 15 oz) 116.2 kg (256 lb 3.2 oz)    Examination:  General exam: Appears calm and comfortable  Respiratory system: Clear to auscultation. Respiratory effort normal. Cardiovascular system: S1 & S2 heard, RRR. No JVD, murmurs, rubs, gallops or clicks. No pedal edema. Gastrointestinal system: Abdomen is nondistended, soft and nontender. No organomegaly or masses felt. Normal bowel sounds heard. Central nervous system: Alert and oriented.  No focal neurological deficits. Extremities: Symmetric 5 x 5 power. Skin: No rashes, lesions or ulcers Psychiatry: Judgement and insight appear normal. Mood & affect appropriate.    Data Reviewed: I have personally reviewed following labs and imaging studies Basic Metabolic Panel:  Recent Labs Lab 04/05/16 2131 04/06/16 0106 04/07/16 0428  NA 138  --  140  K 4.1  --  4.1  CL 105  --  104  CO2 26  --  29  GLUCOSE 94  --  119*  BUN 10  --  11  CREATININE 1.05 1.05 1.02  CALCIUM 8.8*  --  8.9   MG  --  1.9 2.3   Liver Function Tests:  Recent Labs Lab 04/05/16 2131  AST 27  ALT 23  ALKPHOS 91  BILITOT 0.4  PROT 6.7  ALBUMIN 3.2*    Recent Labs Lab 04/05/16 2131  LIPASE 47   No results for input(s): AMMONIA in the last 168 hours. CBC:  Recent Labs Lab 04/05/16 2131 04/06/16 0106 04/07/16 0428  WBC 6.8 7.6 6.8  HGB 9.8* 10.2* 10.5*  HCT 33.2* 34.7* 35.3*  MCV 76.9* 75.8* 75.8*  PLT 377 412* 407*    Recent Labs  04/05/16 2131  BNP 722.6*      Studies: Dg Chest 2 View  Result Date: 04/05/2016 CLINICAL DATA:  Acute onset of shortness of breath and bilateral leg swelling. Abdominal distention. Nausea. Intermittent chest pain. Initial encounter. EXAM: CHEST  2 VIEW COMPARISON:  Chest radiograph performed FINDINGS: The lungs are well-aerated. Vascular congestion is noted. Bibasilar airspace opacities may reflect mild interstitial edema. Prominent left apical airspace opacity is similar in appearance; underlying mass cannot be entirely excluded. There is no evidence of pleural effusion or pneumothorax. The heart is normal in size; the mediastinal contour is within normal limits. No acute osseous abnormalities are seen. IMPRESSION: 1. Vascular congestion noted. Bibasilar airspace opacities may reflect mild interstitial edema. 2. Similar appearance to prominent left apical airspace opacity. Underlying mass cannot be entirely excluded. PET/CT could be considered for further evaluation, as deemed clinically appropriate. Electronically Signed   By: Garald Balding M.D.   On: 04/05/2016 23:20    Scheduled Meds: . amLODipine  10 mg Oral Daily  . enoxaparin (LOVENOX) injection  60 mg Subcutaneous Q24H  . furosemide  20 mg Intravenous Q12H  . gabapentin  800 mg Oral TID  . levothyroxine  50 mcg Oral QAC breakfast  . mometasone-formoterol  2 puff Inhalation BID  . montelukast  10 mg Oral QHS  . pantoprazole  40 mg Oral Daily  . sodium chloride flush  3 mL Intravenous  Q12H   Continuous Infusions:      Time spent: 25 min    [facility] Pager [phone]. If 7PM-7AM, please contact night-coverage at [email or web address], Office  [phone]  password [email or web address] 04/07/2016, 2:12 PM  LOS: 2 days

## 2016-04-07 NOTE — Consult Note (Signed)
Advanced Heart Failure Team Consult Note  Referring Physician: Dr Darrick Meigs Primary Physician: Lamar Blinks, [facility] Primary Cardiologist:  New  Reason for Consultation: New onset HF  HPI:    Leonard [name] is a 40 y.o. male with history of non-Hodgkin's lymphoma s/p Chemoradiation at [facility] - currently in remission, HTN, Asthma, GERD, and chronic pain.    He presented to [facility] 04/05/16 with worsening SOB and peripheral edema x 2 months, worse over the past two weeks. Also c/o of abdominal distention and orthopnea.  Pertinent labs on admission include K  4.1, Creatinine 1.05, BNP 722.6, Negative troponin. CXR showed vascular congestion and bibasilar airspace opacities.   Echo 04/07/16 LVEF 25-30%, Grade 2 DD, Peak PA pressure 47 mm Hg.   He states he is feeling better today than on admission.  Diagnosed with HTN in 2008, has been off meds for 5-6 months. No SOB on flat ground at baseline, but DOE when carrying objects 25 lbs or more short distances.  + Orthopnea.  He often has lightheadedness with rapid standing and has felt several episodes of near syncope PTA.  Occasional palpitations and occasional mild chest pain on exertion.  Former Smoker, 10-15 pack years. No ETOH use.  Former [place] and Cocaine use, last used 2008.   He has a history of non-hodgkins lymphoma treated with chemo x 2 separate diagnoses in 2003 and 2013. He received CHOP for first diagnosis. Unsure of second.  Gets several URIs a year, unsure if viral illness preceded his decompensation.   Of note, patient has excessive salt in his diet. Eats Quarter pounder with cheese and large fry from [employer] "most days".  He will get a 32 oz sweet tea with the meal + a half gallon of sweet tea for home DAILY.  He drinks > 120 oz daily.  States normal weight at home 265 - 272 lbs prior to admission. Snores badly. Had a previous sleep study: inconclusive.   Social History: Currently in between jobs  Lives in [place] with Wife, Mother in [place],  Daughter, and step-Daughter.  Has another baby on the way.  (Will be his 9th kid overall). Was due to start new job tomorrow.  May lose if not out of hospital.   Family History: No significant cardiovascular history.  Mom passed from Lung CA.   Review of Systems: [y] = yes, [ ]  = no   General: Weight gain [ ] ; Weight loss [ ] ; Anorexia [ ] ; Fatigue [y]; Fever [ ] ; Chills [ ] ; Weakness [ ]   Cardiac: Chest pain/pressure [y]; Resting SOB [ ] ; Exertional SOB [y]; Orthopnea [ ] ; Pedal Edema [y]; Palpitations [y]; Syncope [ ] ; Presyncope [y]; Paroxysmal nocturnal dyspnea[ ]   Pulmonary: Cough [ ] ; Wheezing[ ] ; Hemoptysis[ ] ; Sputum [ ] ; Snoring [ ]   GI: Vomiting[ ] ; Dysphagia[ ] ; Melena[ ] ; Hematochezia [ ] ; Heartburn[ ] ; Abdominal pain [ ] ; Constipation [ ] ; Diarrhea [ ] ; BRBPR [ ]   GU: Hematuria[ ] ; Dysuria [ ] ; Nocturia[ ]   Vascular: Pain in legs with walking [ ] ; Pain in feet with lying flat [ ] ; Non-healing sores [ ] ; Stroke [ ] ; TIA [ ] ; Slurred speech [ ] ;  Neuro: Headaches[ ] ; Vertigo[ ] ; Seizures[ ] ; Paresthesias[ ] ;Blurred vision [ ] ; Diplopia [ ] ; Vision changes [ ]   Ortho/Skin: Arthritis [ ] ; Joint pain [ ] ; Muscle pain [ ] ; Joint swelling [ ] ; Back Pain [ ] ; Rash [ ]   Psych: Depression[ ] ; Anxiety[ ]   Heme: Bleeding problems [ ] ;  Clotting disorders [ ] ; Anemia [ ]   Endocrine: Diabetes [ ] ; Thyroid dysfunction[y]  Home Medications Prior to Admission medications   Medication Sig Start Date End Date Taking? Authorizing Provider  acetaminophen (TYLENOL) 500 MG tablet Take 1 tablet (500 mg total) by mouth every 6 (six) hours as needed. Patient taking differently: Take 1,000 mg by mouth every 6 (six) hours as needed (pain).  01/01/16  Yes Marella Chimes, PA-C  albuterol (PROVENTIL HFA;VENTOLIN HFA) 108 (90 Base) MCG/ACT inhaler Inhale 2 puffs into the lungs every 6 (six) hours as needed for wheezing or shortness of breath. 12/11/15  Yes Gay Filler Copland, MD  amLODipine (NORVASC) 10 MG  tablet Take 1 tablet (10 mg total) by mouth daily. 12/11/15  Yes Gay Filler Copland, MD  Aspirin-Acetaminophen-Caffeine (GOODY HEADACHE PO) Take 1 packet by mouth 2 (two) times daily as needed (pain).   Yes Historical Provider, MD  Fluticasone-Salmeterol (ADVAIR) 250-50 MCG/DOSE AEPB Inhale 2 puffs into the lungs 2 (two) times daily.   Yes Historical Provider, MD  gabapentin (NEURONTIN) 800 MG tablet Take 1 tablet (800 mg total) by mouth 3 (three) times daily. 12/11/15  Yes Gay Filler Copland, MD  HYDROcodone-acetaminophen (LORTAB) 5-325 MG tablet Take 1 tablet by mouth every 4 (four) hours as needed for moderate pain. 03/04/16  Yes Gay Filler Copland, MD  levothyroxine (SYNTHROID, LEVOTHROID) 50 MCG tablet Take 1 tablet (50 mcg total) by mouth daily before breakfast. 12/11/15  Yes Jessica C Copland, MD  montelukast (SINGULAIR) 10 MG tablet Take 1 tablet (10 mg total) by mouth at bedtime. 12/11/15  Yes Gay Filler Copland, MD  pantoprazole (PROTONIX) 40 MG tablet Take 1 tablet (40 mg total) by mouth daily. 12/11/15  Yes Gay Filler Copland, MD  albuterol (PROVENTIL) (2.5 MG/3ML) 0.083% nebulizer solution Take 3 mLs (2.5 mg total) by nebulization every 6 (six) hours as needed for wheezing or shortness of breath. Patient not taking: Reported on 04/05/2016 12/11/15   Darreld Mclean, MD  benzonatate (TESSALON) 100 MG capsule Take 1 capsule (100 mg total) by mouth every 8 (eight) hours. Patient not taking: Reported on 04/05/2016 01/01/16   Marella Chimes, PA-C  HYDROcodone-acetaminophen (LORTAB) 5-325 MG tablet Take 1 tablet by mouth every 4 (four) hours as needed for moderate pain. Ok to fill 30 days after rx date Patient not taking: Reported on 04/05/2016 03/04/16   Darreld Mclean, MD    Past Medical History: Past Medical History:  Diagnosis Date  . Asthma   . Blood transfusion without reported diagnosis   . CHF (congestive heart failure) ([place]) 04/05/2016  . COPD (chronic obstructive pulmonary disease)  ([place])   . Hypertension   . Lymphoma ([place])    x 2  . Sleep apnea   . Thyroid disease     Past Surgical History: Past Surgical History:  Procedure Laterality Date  . tumor biopsy    . WISDOM TOOTH EXTRACTION      Family History: Family History  Problem Relation Age of Onset  . Cancer Mother   . Emphysema Mother   . Bronchitis Mother     Social History: Social History   Social History  . Marital status: Single    Spouse name: N/A  . Number of children: N/A  . Years of education: N/A   Social History Main Topics  . Smoking status: Never Smoker  . Smokeless tobacco: Current User    Types: Snuff  . Alcohol use No  . Drug use: No  .  Sexual activity: Yes   Other Topics Concern  . None   Social History Narrative  . None    Allergies:  No Known Allergies  Objective:    Vital Signs:   Temp:  [97.6 F (36.4 C)-98.3 F (36.8 C)] 97.6 F (36.4 C) (08/15 1129) Pulse Rate:  [98-113] 113 (08/15 1129) Resp:  [18] 18 (08/15 1129) BP: (138-158)/(96-98) 158/97 (08/15 1129) SpO2:  [97 %-100 %] 99 % (08/15 1129) Weight:  [256 lb 3.2 oz (116.2 kg)] 256 lb 3.2 oz (116.2 kg) (08/15 0545) Last BM Date: 04/05/16  Weight change: Filed Weights   04/06/16 0127 04/06/16 0523 04/07/16 0545  Weight: 258 lb 9.6 oz (117.3 kg) 257 lb 15 oz (117 kg) 256 lb 3.2 oz (116.2 kg)    Intake/Output:   Intake/Output Summary (Last 24 hours) at 04/07/16 1434 Last data filed at 04/07/16 1356  Gross per 24 hour  Intake             1160 ml  Output             2025 ml  Net             -865 ml     Physical Exam: General:  Well appearing. No resp difficulty HEENT: normal Neck: supple. JVP 10 cm. Carotids 2+ bilat; no bruits. No lymphadenopathy or thyromegaly appreciated. Cor: PMI nondisplaced. Mildly tachy, regular, +S3. Lungs: CTAB, normal effort.  Abdomen: Obese, soft, NT, ND, no HSM. No bruits or masses. +BS  Extremities: no cyanosis, clubbing, rash. 1+ ankle edema.  Neuro: alert  & orientedx3, cranial nerves grossly intact. moves all 4 extremities w/o difficulty. Affect pleasant  Telemetry:  NSR - ST 90-100s  Labs: Basic Metabolic Panel:  Recent Labs Lab 04/05/16 2131 04/06/16 0106 04/07/16 0428  NA 138  --  140  K 4.1  --  4.1  CL 105  --  104  CO2 26  --  29  GLUCOSE 94  --  119*  BUN 10  --  11  CREATININE 1.05 1.05 1.02  CALCIUM 8.8*  --  8.9  MG  --  1.9 2.3    Liver Function Tests:  Recent Labs Lab 04/05/16 2131  AST 27  ALT 23  ALKPHOS 91  BILITOT 0.4  PROT 6.7  ALBUMIN 3.2*    Recent Labs Lab 04/05/16 2131  LIPASE 47   No results for input(s): AMMONIA in the last 168 hours.  CBC:  Recent Labs Lab 04/05/16 2131 04/06/16 0106 04/07/16 0428  WBC 6.8 7.6 6.8  HGB 9.8* 10.2* 10.5*  HCT 33.2* 34.7* 35.3*  MCV 76.9* 75.8* 75.8*  PLT 377 412* 407*    Cardiac Enzymes: No results for input(s): CKTOTAL, CKMB, CKMBINDEX, TROPONINI in the last 168 hours.  BNP: BNP (last 3 results)  Recent Labs  04/05/16 2131  BNP 722.6*    ProBNP (last 3 results) No results for input(s): PROBNP in the last 8760 hours.   CBG: No results for input(s): GLUCAP in the last 168 hours.  Coagulation Studies:  Recent Labs  04/06/16 0209  LABPROT 16.0*  INR 1.27    Other results: EKG: 04/05/16 Sinus Tach 116 bpm  Imaging: Dg Chest 2 View  Result Date: 04/05/2016 CLINICAL DATA:  Acute onset of shortness of breath and bilateral leg swelling. Abdominal distention. Nausea. Intermittent chest pain. Initial encounter. EXAM: CHEST  2 VIEW COMPARISON:  Chest radiograph performed FINDINGS: The lungs are well-aerated. Vascular congestion is noted. Bibasilar airspace opacities  may reflect mild interstitial edema. Prominent left apical airspace opacity is similar in appearance; underlying mass cannot be entirely excluded. There is no evidence of pleural effusion or pneumothorax. The heart is normal in size; the mediastinal contour is within  normal limits. No acute osseous abnormalities are seen. IMPRESSION: 1. Vascular congestion noted. Bibasilar airspace opacities may reflect mild interstitial edema. 2. Similar appearance to prominent left apical airspace opacity. Underlying mass cannot be entirely excluded. PET/CT could be considered for further evaluation, as deemed clinically appropriate. Electronically Signed   By: Garald Balding M.D.   On: 04/05/2016 23:20      Medications:     Current Medications: . carvedilol  3.125 mg Oral BID WC  . enoxaparin (LOVENOX) injection  60 mg Subcutaneous Q24H  . ferrous sulfate  325 mg Oral TID WC  . furosemide  20 mg Intravenous Q12H  . gabapentin  800 mg Oral TID  . levothyroxine  50 mcg Oral QAC breakfast  . mometasone-formoterol  2 puff Inhalation BID  . montelukast  10 mg Oral QHS  . pantoprazole  40 mg Oral Daily  . sodium chloride flush  3 mL Intravenous Q12H     Infusions:      Assessment/Plan   1. New onset combined CHF - Echo 04/07/16 LVEF 25-30%, Grade 2 DD, Peak PA pressure 47 mm Hg.  - He remains volume overloaded on exam.  Lasix 40 mg IV bid.   - He will need catheterization, but is hesitant to stay in hospital for much longer.  - Ordered Hepatitis panel, HIV test, ANA, RF, SPEP, and IFE Urine for further evaluation of NICM.  - Agree with switch from amlodipine to coreg.  - Will start on Entresto 24/26 mg BID.   - Could likely benefit from cMRI if NICM.  2. HTN - Will adjust his medications in the setting of treating his HF.  - Adding Entresto as above.  3. H/o Non-Hodgkins Lymphoma -  Has previously gotten adriamycin as part of treatment. May be contributing factor.  4. Snoring - Snores badly with + apneic episodes.  Had an inconclusive sleep study in the past.   - Needs repeat Sleep study.  5. Asthma - Per primary 6. 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  7. Social - Will have CSW/ CM see about possible [employer] and Wellness -  May also be able to use HF fund for HF meds and patient assistance program for [employer].  Length of Stay: 2  Shirley Friar PA-C 04/07/2016, 2:34 PM  Advanced Heart Failure Team Pager [phone] (M-F; 7a - 4p)  Please contact [facility] for night-coverage after hours (4p -7a ) and weekends on [email or web address]  Patient seen with PA, agree with the above note.  He has had exertional dyspnea x about 2 months.  No prior history of CHF.  Came to ER because of worsening ankle edema.  Echo showed EF 25-30% with diffuse hypokinesis.  1. Acute on chronic systolic CHF: EF [id number] with diffuse hypokinesis.  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  Also has had poorly controlled HTN.  Had prior radiation to chest and was a smoker, so CAD is not out of the question.  On exam, he remains volume overloaded and has S3 gallop.  BP preserved.  He has diuresed reasonably well so far and symptomatically is feeling considerably better.  - Lasix 40 mg IV bid, will give dose this evening.  - Add Entresto  24/26 bid. - Can continue Coreg 3.125 mg bid.   - Add spironolactone at low dose, 12.5 mg daily.  - Needs RHC/LHC.  He needs to leave tomorrow or will lose his job (needs to be there tomorrow evening).  I will likely let him go home tomorrow and will bring him back for cath as outpatient next week.  2. HTN: ?Contributor to cardiomyopathy.  As above, adding Entresto and spironolactone.  BP is high.  3. Suspect OSA: Will arrange sleep study as outpatient.  4. Needs social work assistance with meds.   Loralie Champagne 04/07/2016 4:39 PM

## 2016-04-07 NOTE — Progress Notes (Signed)
  Echocardiogram 2D Echocardiogram with Definity has been performed.  Bobbye Charleston 04/07/2016, 11:28 AM

## 2016-04-07 NOTE — Progress Notes (Addendum)
!  700 Dr Glean Salen came in .Discussed DX and plan of care. Pt understood . Answere appropriate questions

## 2016-04-08 DIAGNOSIS — E8770 Fluid overload, unspecified: Secondary | ICD-10-CM

## 2016-04-08 DIAGNOSIS — R609 Edema, unspecified: Secondary | ICD-10-CM

## 2016-04-08 DIAGNOSIS — R0602 Shortness of breath: Secondary | ICD-10-CM

## 2016-04-08 DIAGNOSIS — I5021 Acute systolic (congestive) heart failure: Secondary | ICD-10-CM

## 2016-04-08 LAB — CBC
HEMATOCRIT: 38.5 % — AB (ref 39.0–52.0)
Hemoglobin: 11.2 g/dL — ABNORMAL LOW (ref 13.0–17.0)
MCH: 22.2 pg — AB (ref 26.0–34.0)
MCHC: 29.1 g/dL — AB (ref 30.0–36.0)
MCV: 76.2 fL — AB (ref 78.0–100.0)
Platelets: 445 10*3/uL — ABNORMAL HIGH (ref 150–400)
RBC: 5.05 MIL/uL (ref 4.22–5.81)
RDW: 17.3 % — AB (ref 11.5–15.5)
WBC: 7.7 10*3/uL (ref 4.0–10.5)

## 2016-04-08 LAB — BASIC METABOLIC PANEL
Anion gap: 7 (ref 5–15)
BUN: 10 mg/dL (ref 6–20)
CHLORIDE: 104 mmol/L (ref 101–111)
CO2: 30 mmol/L (ref 22–32)
CREATININE: 1.15 mg/dL (ref 0.61–1.24)
Calcium: 9 mg/dL (ref 8.9–10.3)
GFR calc non Af Amer: >60 mL/min (ref >60)
GLUCOSE: 120 mg/dL — AB (ref 65–99)
Potassium: 3.4 mmol/L — ABNORMAL LOW (ref 3.5–5.1)
Sodium: 141 mmol/L (ref 135–145)

## 2016-04-08 LAB — MAGNESIUM: Magnesium: 2.2 mg/dL (ref 1.7–2.4)

## 2016-04-08 LAB — PROTEIN ELECTROPHORESIS, SERUM
A/G Ratio: 0.9 (ref 0.7–1.7)
ALBUMIN ELP: 3.4 g/dL (ref 2.9–4.4)
ALPHA-1-GLOBULIN: 0.3 g/dL (ref 0.0–0.4)
ALPHA-2-GLOBULIN: 0.9 g/dL (ref 0.4–1.0)
BETA GLOBULIN: 1.3 g/dL (ref 0.7–1.3)
GAMMA GLOBULIN: 1.3 g/dL (ref 0.4–1.8)
Globulin, Total: 3.7 g/dL (ref 2.2–3.9)
Total Protein ELP: 7.1 g/dL (ref 6.0–8.5)

## 2016-04-08 LAB — HIV ANTIBODY (ROUTINE TESTING W REFLEX): HIV Screen 4th Generation wRfx: NONREACTIVE

## 2016-04-08 LAB — HEPATITIS PANEL, ACUTE
HCV AB: 0.2 {s_co_ratio} (ref 0.0–0.9)
HEP A IGM: NEGATIVE
HEP B S AG: NEGATIVE
Hep B C IgM: NEGATIVE

## 2016-04-08 MED ORDER — FERROUS SULFATE 325 (65 FE) MG PO TABS: 325.0000 mg | ORAL_TABLET | Freq: Every day | ORAL | 0 refills | Status: DC

## 2016-04-08 MED ORDER — CARVEDILOL 3.125 MG PO TABS: 3.1250 mg | ORAL_TABLET | Freq: Two times a day (BID) | ORAL | 0 refills | Status: DC

## 2016-04-08 MED ORDER — SACUBITRIL-VALSARTAN 24-26 MG PO TABS: 1.0000 | ORAL_TABLET | Freq: Two times a day (BID) | ORAL | 0 refills | Status: DC

## 2016-04-08 MED ORDER — FUROSEMIDE 40 MG PO TABS: 40.0000 mg | ORAL_TABLET | Freq: Every day | ORAL | 0 refills | Status: DC

## 2016-04-08 MED ORDER — POTASSIUM CHLORIDE CRYS ER 20 MEQ PO TBCR
40.0000 meq | EXTENDED_RELEASE_TABLET | Freq: Once | ORAL | Status: AC
  Administered 2016-04-08: 40 meq via ORAL
  Filled 2016-04-08: qty 2

## 2016-04-08 MED ORDER — SPIRONOLACTONE 25 MG PO TABS: 12.5000 mg | ORAL_TABLET | Freq: Every day | ORAL | 0 refills | Status: DC

## 2016-04-08 MED FILL — FUROSEMIDE 40 MG TABLET: 40 | 34 days supply | Qty: 34 | Fill #0

## 2016-04-08 MED FILL — SPIRONOLACTONE 25 MG TABLET: 25 | 34 days supply | Qty: 17 | Fill #0

## 2016-04-08 MED FILL — CARVEDILOL 3.125 MG TABLET: 3.125 | 34 days supply | Qty: 68 | Fill #0

## 2016-04-08 MED FILL — ENTRESTO 24 MG-26 MG TABLET: 24-26 | 30 days supply | Qty: 60 | Fill #0

## 2016-04-08 NOTE — Progress Notes (Signed)
Heart Failure Navigator Consult Note  Presentation: Leonard [name] is a 40 y.o. male with history of non-Hodgkin's lymphoma s/p Chemoradiation at [facility] - currently in remission, HTN, Asthma, GERD, and chronic pain.    He presented to [facility] 04/05/16 with worsening SOB and peripheral edema x 2 months, worse over the past two weeks. Also c/o of abdominal distention and orthopnea.  Pertinent labs on admission include K  4.1, Creatinine 1.05, BNP 722.6, Negative troponin. CXR showed vascular congestion and bibasilar airspace opacities   Past Medical History:  Diagnosis Date  . Asthma   . Blood transfusion without reported diagnosis   . CHF (congestive heart failure) ([place]) 04/05/2016  . COPD (chronic obstructive pulmonary disease) ([place])   . Hypertension   . Lymphoma ([place])    x 2  . Sleep apnea   . Thyroid disease     Social History   Social History  . Marital status: Single    Spouse name: N/A  . Number of children: N/A  . Years of education: N/A   Social History Main Topics  . Smoking status: Former Smoker    Packs/day: 1.25    Years: 10.00    Types: Cigarettes    Quit date: 04/08/2007  . Smokeless tobacco: Current User    Types: Snuff  . Alcohol use No  . Drug use: No  . Sexual activity: Yes   Other Topics Concern  . None   Social History Narrative  . None    ECHO:Study Conclusions-04/07/16  - Left ventricle: The cavity size was normal. There was mild   concentric hypertrophy. Systolic function was severely reduced.   The estimated ejection fraction was in the range of 25% to 30%.   There is akinesis of the anteroseptal and inferoseptal   myocardium. Features are consistent with a pseudonormal left   ventricular filling pattern, with concomitant abnormal relaxation   and increased filling pressure (grade 2 diastolic dysfunction). - Aortic valve: Trileaflet; mildly thickened, mildly calcified   leaflets. There was mild regurgitation. - Aorta: Aortic root dimension: 43  mm (ED). - Ascending aorta: The ascending aorta was mildly dilated. - Mitral valve: There was mild regurgitation. - Pulmonary arteries: Systolic pressure was moderately increased.   PA peak pressure: 47 mm Hg (S).  BNP    Component Value Date/Time   BNP 722.6 (H) 04/05/2016 2131    ProBNP No results found for: PROBNP   Education Assessment and Provision:  Detailed education and instructions provided on heart failure disease management including the following:  Signs and symptoms of Heart Failure When to call the physician Importance of daily weights Low sodium diet Fluid restriction Medication management Anticipated future follow-up appointments  Patient education given on each of the above topics.  Patient acknowledges understanding and acceptance of all instructions.  I spoke with Mr. Vachon regarding his current hospitalization and HF diagnosis.  He tells me that he does not have a scale and cannot afford one.  I will provide one for home use.  We discussed the importance of daily weights and how weight increases relate to the signs and symptoms of HF.  We discussed at length a low sodium diet and high sodium foods to avoid.  His girlfriend tells me that she brought him [employer] last night-- ("the last time" --he says).  I also reviewed fluid restriction.  He admits that he will be unable to afford new medications at the time of discharge.  I will send his new HF prescriptions to the  [facility] pharmacy --provided through HF medication assistance program.  He will follow outpatient with the [facility].  Education Materials:  "Living Better With Heart Failure" Booklet, Daily Weight Tracker Tool    High Risk Criteria for Readmission and/or Poor Patient Outcomes:  (Recommend Follow-up with Advanced Heart Failure Clinic)--yes   EF <30%- yes 25-30%  2 or more admissions in 6 months- yes-2/79mo  Difficult social situation- Yes --no current income or insurance  Demonstrates  medication noncompliance- No denies?    Barriers of Care:  Knowledge of HF and compliance  Discharge Planning:   Plans to discharge to home with girlfriend/wife and children.  He will need ongoing support for financial issues--for which I have referred him to the [facility] LCSW.  He will also need ongoing HF education, compliance reinforcement and review of  symptom recognition.

## 2016-04-08 NOTE — Progress Notes (Signed)
Advanced Heart Failure Rounding Note  Referring Physician: Dr Darrick Meigs Primary Physician: None currently ( Setting up with [facility]) Primary Cardiologist:  New  Reason for Consultation: New onset HF  Subjective:    Echo 04/07/16 LVEF 25-30%, Grade 2 DD, Peak PA pressure 47 mm Hg.   Continues to feel better. Having occasional dizziness ( which is close to his baseline) on Entresto.  Passes quickly and is not limiting.  No CP or DOE this am. Adamant that he needs to leave today.   I/O negative 4L and down 4 lbs.  Creatinine stable K 3.1.  Objective:   Weight Range: 251 lb 14.4 oz (114.3 kg) Body mass index is 34.16 kg/m.   Vital Signs:   Temp:  [97.6 F (36.4 C)-97.9 F (36.6 C)] 97.9 F (36.6 C) (08/16 0502) Pulse Rate:  [92-113] 92 (08/16 0502) Resp:  [16-18] 16 (08/16 0502) BP: (95-158)/(54-97) 95/54 (08/16 0502) SpO2:  [97 %-99 %] 98 % (08/16 0502) Weight:  [251 lb 14.4 oz (114.3 kg)] 251 lb 14.4 oz (114.3 kg) (08/16 0502) Last BM Date: 04/05/16  Weight change: Filed Weights   04/06/16 0523 04/07/16 0545 04/08/16 0502  Weight: 257 lb 15 oz (117 kg) 256 lb 3.2 oz (116.2 kg) 251 lb 14.4 oz (114.3 kg)    Intake/Output:   Intake/Output Summary (Last 24 hours) at 04/08/16 1022 Last data filed at 04/08/16 0820  Gross per 24 hour  Intake             1160 ml  Output             3760 ml  Net            -2600 ml     Physical Exam: General:  Well appearing. No resp difficulty HEENT: normal Neck: supple. JVP 10 cm. Carotids 2+ bilat; no bruits. No thyromegaly or nodule noted.  Cor: PMI nondisplaced. Remains slightly tachy but regular, +S3. Lungs: Clear Abdomen: Obese, soft, NT, ND, no HSM. No bruits or masses. +BS  Extremities: no cyanosis, clubbing, rash. Trace ankle edema.   Neuro: alert & orientedx3, cranial nerves grossly intact. moves all 4 extremities w/o difficulty. Affect pleasant  Telemetry: reviewed personally, Remains mildly tachy  90-110s Labs: CBC  Recent Labs  04/07/16 0428 04/08/16 0532  WBC 6.8 7.7  HGB 10.5* 11.2*  HCT 35.3* 38.5*  MCV 75.8* 76.2*  PLT 407* [id number]*   Basic Metabolic Panel  Recent Labs  04/07/16 0428 04/08/16 0532  NA 140 141  K 4.1 3.4*  CL 104 104  CO2 29 30  GLUCOSE 119* 120*  BUN 11 10  CREATININE 1.02 1.15  CALCIUM 8.9 9.0  MG 2.3 2.2   Liver Function Tests  Recent Labs  04/05/16 2131  AST 27  ALT 23  ALKPHOS 91  BILITOT 0.4  PROT 6.7  ALBUMIN 3.2*    Recent Labs  04/05/16 2131  LIPASE 47   Cardiac Enzymes No results for input(s): CKTOTAL, CKMB, CKMBINDEX, TROPONINI in the last 72 hours.  BNP: BNP (last 3 results)  Recent Labs  04/05/16 2131  BNP 722.6*    ProBNP (last 3 results) No results for input(s): PROBNP in the last 8760 hours.   D-Dimer  Recent Labs  04/05/16 2131  DDIMER 0.28   Hemoglobin A1C No results for input(s): HGBA1C in the last 72 hours. Fasting Lipid Panel  Recent Labs  04/06/16 0106  CHOL 131  HDL 24*  LDLCALC 82  TRIG 127  CHOLHDL 5.5   Thyroid Function Tests  Recent Labs  04/06/16 0106  TSH 6.211*    Other results:     Imaging/Studies:   No results found.  Latest Echo  Latest Cath   Medications:     Scheduled Medications: . carvedilol  3.125 mg Oral BID WC  . enoxaparin (LOVENOX) injection  60 mg Subcutaneous Q24H  . ferrous sulfate  325 mg Oral TID WC  . furosemide  40 mg Intravenous BID  . gabapentin  800 mg Oral TID  . levothyroxine  50 mcg Oral QAC breakfast  . mometasone-formoterol  2 puff Inhalation BID  . montelukast  10 mg Oral QHS  . pantoprazole  40 mg Oral Daily  . potassium chloride  40 mEq Oral Once  . sacubitril-valsartan  1 tablet Oral BID  . sodium chloride flush  3 mL Intravenous Q12H  . spironolactone  12.5 mg Oral Daily     Infusions:     PRN Medications:  sodium chloride, acetaminophen, albuterol, ondansetron (ZOFRAN) IV, sodium chloride  flush   Assessment/Plan   Leonard [name] is a 40 y.o. male with new onset CHF. HF team consulted for same. He has history of adriamycin exposure for Non-Hodgkins Lymphoma.   1. New onset combined CHF - Echo 04/07/16 LVEF 25-30%, Grade 2 DD, Peak PA pressure 47 mm Hg.  - He remains volume overloaded on exam.  Lasix 40 mg IV bid.   - Set up for [facility] 04/15/16 at 830 am.  Knows to report to hospital at 630 am and to be NPO p midnight.  - Negative Hepatitis and HIV panels - Pending ANA, RF, SPEP, and IFE Urine for further evaluation of NICM.  - Continue current medications. - Could likely benefit from cMRI if NICM.  2. HTN - Will adjust his medications in the setting of treating his HF.  - Meds as below for home.  3. H/o Non-Hodgkins Lymphoma -  Has previously gotten adriamycin as part of treatment. Likely contributing factor.  4. Snoring - Snores badly with + apneic episodes.  Had an inconclusive sleep study in the past.   - Needs repeat Sleep study once insurance arranged. 6. Social -  To be seen about [place] as outpatient. (has appointment per case management.  - HF meds will be provided by HF fund, and Entreso will be arranged via patients assistance after 30 day free picked up.   He is tenuous but stable for d/c today with Cath next week and close follow up.   HF meds for home Coreg 3.125 mg BID Entresto 24/26 mg BID Lasix 40 mg daily Spironolactone 12.5 mg daily  Length of Stay: 3  Shirley Friar PA-C 04/08/2016, 10:22 AM  Advanced Heart Failure Team Pager [phone] (M-F; 7a - 4p)  Please contact [facility] for night-coverage after hours (4p -7a ) and weekends on [email or web address]  Patient seen with PA, agree with the above note.   1. Acute on chronic systolic CHF: EF [id number] with diffuse hypokinesis.  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  Also has had poorly controlled HTN.  Had prior radiation to chest and was a  smoker, so CAD is not out of the question.  Good diuresis, volume status improved on exam.  - Transition to Lasix 40 mg po daily for home.   - Continue current Coreg, Entresto, and spironolactone.  No BP room to titrate further at this time.  He  will stop amlodipine at home.  - Needs RHC/LHC.  He has to go home today so he does not lose his job.  Will plan for cath next Wednesday (arranged).   2. HTN: ?Contributor to cardiomyopathy.  BP now controlled on current regimen.  3. Suspect OSA: Will arrange sleep study as outpatient.  4. Needs social work assistance with meds.   He will go home today on the above meds.  Followup for cath next Wednesday with me.  Followup in office about 2 wks later.  BMET when he gets cath.   Loralie Champagne 04/08/2016 12:58 PM

## 2016-04-08 NOTE — Progress Notes (Signed)
Physician Discharge Summary  Leonard [name] [id number] DOB: [birth year] DOA: 04/05/2016  PCP: Lamar Blinks, MD  Admit date: 04/05/2016 Discharge date: 04/08/2016  Admitted From: Home Disposition: Home  Recommendations for Outpatient Follow-up:  1. Follow up with Heart Failure Clinic as scheduled 2. Please obtain BMP/CBC on follow up 3. Please recheck TSH in 6 weeks.   Discharge Condition: Stable  CODE STATUS: FULL Diet recommendation: Heart Healthy  Brief/Interim Summary: Brief HPI:   40 year old Caucasian male past medical history significant for non-Hodgkin's lymphoma status post chemoradiation at [facility]-currently in remission), essential hypertension, asthma, GERD, and chronic pain. Patient presents to theemergency department on 8/14/6 2017 complaining of shortness of breath, and lower extremity edema. Patient states his symptoms and present for the preceding 2 months, but have gradually worsened over the past 2 weeks. He notes orthopnea, but only uses one pillow at night. He notes abdominal distention. No history of heart disease. Patient denies any recent changes in medications, including his levothyroxine, which is prescribed for hypothyroidism. No fever chills at home. No sick contacts. Patient states his asthma is "fairly well controlled"and denies recent prednisone use. No blood loss noted at home.     Assessment/Plan:    1. 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 Cause, ischemic /NICM/? R- CHOP side effect. Will Follow up in HF clinic for further management.  OK to discharge home per HF team.  DC heart meds recommended below.  See discharge medications.   2. Microcytic anemia- B12 level is 485, Iron level is 27. Will start ferrous sulfate. 3. Hypertension- well controlled on current regimen.   4. Hypothyroidism- continue synthroid daily.  Mildly elevated TSH noted,  Recheck TSH outpatient in 6 weeks.      DVT prophylaxis: Lovenox Code Status:  Full code Family Communication: No family at bedside Disposition Plan: Home  Consultants:  None   Procedures: Echocardiogram There was mild concentric hypertrophy. Systolic function was severely reduced. The estimated ejection fraction was in the range of 25% to 30%. There is akinesis of the anteroseptal and inferoseptal myocardium. Features are consistent with a pseudonormal left ventricular filling pattern, with concomitant abnormal relaxation and increased filling pressure (grade 2 diastolic dysfunction).  - Aortic valve: Trileaflet; mildly thickened, mildly calcified   Discharge Diagnoses:  Active Problems:   CHF (congestive heart failure) (HCC)   Hypervolemia   Peripheral edema   Shortness of breath   Acute systolic CHF (congestive heart failure) [facility])  Discharge Instructions  Discharge Instructions    Diet - low sodium heart healthy    Complete by:  As directed   Increase activity slowly    Complete by:  As directed       Medication List    STOP taking these medications   amLODipine 10 MG tablet Commonly known as:  NORVASC   benzonatate 100 MG capsule Commonly known as:  TESSALON   GOODY HEADACHE PO     TAKE these medications   acetaminophen 500 MG tablet Commonly known as:  TYLENOL Take 1 tablet (500 mg total) by mouth every 6 (six) hours as needed. What changed:  how much to take  reasons to take this   albuterol 108 (90 Base) MCG/ACT inhaler Commonly known as:  PROVENTIL HFA;VENTOLIN HFA Inhale 2 puffs into the lungs every 6 (six) hours as needed for wheezing or shortness of breath.   albuterol (2.5 MG/3ML) 0.083% nebulizer solution Commonly known as:  PROVENTIL Take 3 mLs (2.5 mg total) by nebulization every 6 (six) hours as  needed for wheezing or shortness of breath.   carvedilol 3.125 MG tablet Commonly known as:  COREG Take 1 tablet (3.125 mg total) by mouth 2 (two) times daily with a meal.   ferrous sulfate 325 (65  FE) MG tablet Take 1 tablet (325 mg total) by mouth daily with breakfast.   Fluticasone-Salmeterol 250-50 MCG/DOSE Aepb Commonly known as:  ADVAIR Inhale 2 puffs into the lungs 2 (two) times daily.   furosemide 40 MG tablet Commonly known as:  LASIX Take 1 tablet (40 mg total) by mouth daily.   gabapentin 800 MG tablet Commonly known as:  NEURONTIN Take 1 tablet (800 mg total) by mouth 3 (three) times daily.   HYDROcodone-acetaminophen 5-325 MG tablet Commonly known as:  LORTAB Take 1 tablet by mouth every 4 (four) hours as needed for moderate pain. What changed:  Another medication with the same name was removed. Continue taking this medication, and follow the directions you see here.   levothyroxine 50 MCG tablet Commonly known as:  SYNTHROID, LEVOTHROID Take 1 tablet (50 mcg total) by mouth daily before breakfast.   montelukast 10 MG tablet Commonly known as:  SINGULAIR Take 1 tablet (10 mg total) by mouth at bedtime.   pantoprazole 40 MG tablet Commonly known as:  PROTONIX Take 1 tablet (40 mg total) by mouth daily.   sacubitril-valsartan 24-26 MG Commonly known as:  ENTRESTO Take 1 tablet by mouth 2 (two) times daily.   spironolactone 25 MG tablet Commonly known as:  ALDACTONE Take 0.5 tablets (12.5 mg total) by mouth daily.      Follow-up Information    [facility] Follow up on 05/26/2016.   Specialty:  Internal Medicine Why:  At 11am  for hospital follow up  Contact information: [facility] [place] [facility] Follow up on 04/15/2016.   Why:  Please report at 630 am for Heart catheterization at 830 am.  Contact information: [place] [id number] [place] Follow up on 04/29/2016.   Specialty:  Cardiology Why:  at 1000 am for post hospital follow. Please bring all of your  medications to your visit. The code for parking is 0030. Contact information: [address] [id number] [place] [phone]         No Known Allergies  Procedures/Studies: Dg Chest 2 View  Result Date: 04/05/2016 CLINICAL DATA:  Acute onset of shortness of breath and bilateral leg swelling. Abdominal distention. Nausea. Intermittent chest pain. Initial encounter. EXAM: CHEST  2 VIEW COMPARISON:  Chest radiograph performed FINDINGS: The lungs are well-aerated. Vascular congestion is noted. Bibasilar airspace opacities may reflect mild interstitial edema. Prominent left apical airspace opacity is similar in appearance; underlying mass cannot be entirely excluded. There is no evidence of pleural effusion or pneumothorax. The heart is normal in size; the mediastinal contour is within normal limits. No acute osseous abnormalities are seen. IMPRESSION: 1. Vascular congestion noted. Bibasilar airspace opacities may reflect mild interstitial edema. 2. Similar appearance to prominent left apical airspace opacity. Underlying mass cannot be entirely excluded. PET/CT could be considered for further evaluation, as deemed clinically appropriate. Electronically Signed   By: Garald Balding M.D.   On: 04/05/2016 23:20     Subjective: Pt without complaints.  Says that he feels better and much less ankle edema.  NO SOB or CP.    Discharge Exam: Vitals:   04/07/16 1947 04/08/16 0502  BP: 122/81 (!) 95/54  Pulse: (!) 107 92  Resp: 18 16  Temp: 97.8 F (36.6 C) 97.9 F (36.6 C)   Vitals:   04/07/16 1129 04/07/16 1947 04/07/16 1957 04/08/16 0502  BP: (!) 158/97 122/81  (!) 95/54  Pulse: (!) 113 (!) 107  92  Resp: 18 18  16   Temp: 97.6 F (36.4 C) 97.8 F (36.6 C)  97.9 F (36.6 C)  TempSrc: Oral Oral  Oral  SpO2: 99% 97% 97% 98%  Weight:    114.3 kg (251 lb 14.4 oz)  Height:        General: Pt is alert, awake, not in acute distress Cardiovascular: RRR,  S1/S2 +, no rubs, no gallops Respiratory: CTA bilaterally, no wheezing, no rhonchi Abdominal: Soft, NT, ND, bowel sounds + Extremities: no edema, no cyanosis Skin: multiple tattoos upper extremities bilateral.   The results of significant diagnostics from this hospitalization (including imaging, microbiology, ancillary and laboratory) are listed below for reference.     Microbiology: No results found for this or any previous visit (from the past 240 hour(s)).   Labs: BNP (last 3 results)  Recent Labs  04/05/16 2131  BNP [id number]*   Basic Metabolic Panel:  Recent Labs Lab 04/05/16 2131 04/06/16 0106 04/07/16 0428 04/08/16 0532  NA 138  --  140 141  K 4.1  --  4.1 3.4*  CL 105  --  104 104  CO2 26  --  29 30  GLUCOSE 94  --  119* 120*  BUN 10  --  11 10  CREATININE 1.05 1.05 1.02 1.15  CALCIUM 8.8*  --  8.9 9.0  MG  --  1.9 2.3 2.2   Liver Function Tests:  Recent Labs Lab 04/05/16 2131  AST 27  ALT 23  ALKPHOS 91  BILITOT 0.4  PROT 6.7  ALBUMIN 3.2*    Recent Labs Lab 04/05/16 2131  LIPASE 47   No results for input(s): AMMONIA in the last 168 hours. CBC:  Recent Labs Lab 04/05/16 2131 04/06/16 0106 04/07/16 0428 04/08/16 0532  WBC 6.8 7.6 6.8 7.7  HGB 9.8* 10.2* 10.5* 11.2*  HCT 33.2* 34.7*  33.9* 35.3* 38.5*  MCV 76.9* 75.8* 75.8* 76.2*  PLT 377 412* 407* 445*   Cardiac Enzymes: No results for input(s): CKTOTAL, CKMB, CKMBINDEX, TROPONINI in the last 168 hours. BNP: Invalid input(s): POCBNP CBG: No results for input(s): GLUCAP in the last 168 hours. D-Dimer  Recent Labs  04/05/16 2131  DDIMER 0.28   Hgb A1c No results for input(s): HGBA1C in the last 72 hours. Lipid Profile  Recent Labs  04/06/16 0106  CHOL 131  HDL 24*  LDLCALC 82  TRIG 127  CHOLHDL 5.5   Thyroid function studies  Recent Labs  04/06/16 0106  TSH 6.211*   Anemia work up  Recent Labs  04/06/16 0106  VITAMINB12 485  FERRITIN 13*  TIBC 496*   IRON 27*  RETICCTPCT 1.9   Urinalysis    Component Value Date/Time   COLORURINE YELLOW 04/06/2016 0236   APPEARANCEUR CLEAR 04/06/2016 0236   LABSPEC 1.008 04/06/2016 0236   PHURINE 7.0 04/06/2016 0236   GLUCOSEU NEGATIVE 04/06/2016 0236   HGBUR NEGATIVE 04/06/2016 0236   BILIRUBINUR NEGATIVE 04/06/2016 0236   KETONESUR NEGATIVE 04/06/2016 0236   PROTEINUR NEGATIVE 04/06/2016 0236   NITRITE NEGATIVE 04/06/2016 0236   LEUKOCYTESUR NEGATIVE 04/06/2016 0236  Sepsis Labs Invalid input(s): PROCALCITONIN,  WBC,  LACTICIDVEN Microbiology No results found for this or any previous visit (from the past 240 hour(s)).  Time coordinating discharge: 32 minutes  SIGNED:  Irwin Brakeman, MD  [facility] 04/08/2016, 11:59 AM Pager   If 7PM-7AM, please contact night-coverage [email or web address] Password [email or web address]

## 2016-04-08 NOTE — Care Management Note (Signed)
Case Management Note  Patient Details  Name: Robt Gunnell MRN: [id number] Date of Birth: 1976/03/15  Subjective/Objective:       Admitted with New CHF             Action/Plan: CM talked to patient about DCP; Apt made at [facility] for follow up care / and Eligibility for medical coverage. CM also informed pt that at discharge he can get his medication filled there also. Entresto coupon card given to patient with explanation of usage- prescription to be filled at [facility] at discharge. Lots of emotional supports given to patient.  Expected Discharge Date:   possibly 04/08/2016               Expected Discharge Plan:  Home/Self Care  Discharge planning Services  CM Consult, [facility]    Status of Service:  Completed, signed off  Sherrilyn Rist [id number] 04/08/2016, 10:41 AM

## 2016-04-09 ENCOUNTER — Telehealth: Payer: Self-pay | Admitting: Behavioral Health

## 2016-04-09 LAB — RHEUMATOID FACTOR: RHEUMATOID FACTOR: 11.5 [IU]/mL (ref 0.0–13.9)

## 2016-04-09 LAB — ANTINUCLEAR ANTIBODIES, IFA: ANA Ab, IFA: NEGATIVE

## 2016-04-09 LAB — IMMUNOFIXATION, URINE

## 2016-04-09 NOTE — Discharge Summary (Signed)
Leonard Iba, MD Physician Signed Internal Medicine  Progress Notes Date of Service: 04/08/2016 11:59 AM    Expand All Collapse All   [] [facility] copied text Physician Discharge Summary  Leonard [name] [id number] DOB: [birth year] DOA: 04/05/2016  PCP: Lamar Blinks, MD  Admit date: 04/05/2016 Discharge date: 04/08/2016  Admitted From: Home Disposition: Home  Recommendations for Outpatient Follow-up:  1. Follow up with Heart Failure Clinic as scheduled 2. Please obtain BMP/CBC on follow up 3. Please recheck TSH in 6 weeks.   Discharge Condition: Stable  CODE STATUS: FULL Diet recommendation: Heart Healthy  Brief/Interim Summary: Brief HPI:   40 year old Caucasian male past medical history significant for non-Hodgkin's lymphoma status post chemoradiation at [facility]-currently in remission), essential hypertension, asthma, GERD, and chronic pain. Patient presents to theemergency department on 8/14/6 2017 complaining of shortness of breath, and lower extremity edema. Patient states his symptoms and present for the preceding 2 months, but have gradually worsened over the past 2 weeks. He notes orthopnea, but only uses one pillow at night. He notes abdominal distention. No history of heart disease. Patient denies any recent changes in medications, including his levothyroxine, which is prescribed for hypothyroidism. No fever chills at home. No sick contacts. Patient states his asthma is "fairly well controlled"and denies recent prednisone use. No blood loss noted at home.    Assessment/Plan:    1. 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 Cause, ischemic /NICM/? R- CHOP side effect. Will Follow up in HF clinic for further management.  OK to discharge home per HF team.  DC heart meds recommended below.  See discharge medications.   2. Microcytic anemia-B12 level is 485, Iron level is 27. Will start ferrous sulfate. 3. Hypertension- well controlled on current  regimen.   4. Hypothyroidism- continue synthroid daily.  Mildly elevated TSH noted,  Recheck TSH outpatient in 6 weeks.      DVT prophylaxis:Lovenox Code Status:Full code Family Communication:No family at bedside Disposition Plan:Home  Consultants:  None   Procedures: Echocardiogram There was mild concentric hypertrophy. Systolic function was severely reduced. The estimated ejection fraction was in the range of 25% to 30%. There is akinesis of the anteroseptal and inferoseptal myocardium. Features are consistent with a pseudonormal left ventricular filling pattern, with concomitant abnormal relaxation and increased filling pressure (grade 2 diastolic dysfunction).  - Aortic valve: Trileaflet; mildly thickened, mildly calcified   Discharge Diagnoses:  Active Problems:   CHF (congestive heart failure) (HCC)   Hypervolemia   Peripheral edema   Shortness of breath   Acute systolic CHF (congestive heart failure) [facility])  Discharge Instructions      Discharge Instructions    Diet - low sodium heart healthy    Complete by:  As directed   Increase activity slowly    Complete by:  As directed       Medication List    STOP taking these medications   amLODipine 10 MG tablet Commonly known as:  NORVASC  benzonatate 100 MG capsule Commonly known as:  TESSALON  GOODY HEADACHE PO    TAKE these medications   acetaminophen 500 MG tablet Commonly known as:  TYLENOL Take 1 tablet (500 mg total) by mouth every 6 (six) hours as needed. What changed:  how much to take  reasons to take this  albuterol 108 (90 Base) MCG/ACT inhaler Commonly known as:  PROVENTIL HFA;VENTOLIN HFA Inhale 2 puffs into the lungs every 6 (six) hours as needed for wheezing or shortness of breath.  albuterol (2.5  MG/3ML) 0.083% nebulizer solution Commonly known as:  PROVENTIL Take 3 mLs (2.5 mg total) by nebulization every 6 (six) hours as needed for wheezing or  shortness of breath.  carvedilol 3.125 MG tablet Commonly known as:  COREG Take 1 tablet (3.125 mg total) by mouth 2 (two) times daily with a meal.  ferrous sulfate 325 (65 FE) MG tablet Take 1 tablet (325 mg total) by mouth daily with breakfast.  Fluticasone-Salmeterol 250-50 MCG/DOSE Aepb Commonly known as:  ADVAIR Inhale 2 puffs into the lungs 2 (two) times daily.  furosemide 40 MG tablet Commonly known as:  LASIX Take 1 tablet (40 mg total) by mouth daily.  gabapentin 800 MG tablet Commonly known as:  NEURONTIN Take 1 tablet (800 mg total) by mouth 3 (three) times daily.  HYDROcodone-acetaminophen 5-325 MG tablet Commonly known as:  LORTAB Take 1 tablet by mouth every 4 (four) hours as needed for moderate pain. What changed:  Another medication with the same name was removed. Continue taking this medication, and follow the directions you see here.  levothyroxine 50 MCG tablet Commonly known as:  SYNTHROID, LEVOTHROID Take 1 tablet (50 mcg total) by mouth daily before breakfast.  montelukast 10 MG tablet Commonly known as:  SINGULAIR Take 1 tablet (10 mg total) by mouth at bedtime.  pantoprazole 40 MG tablet Commonly known as:  PROTONIX Take 1 tablet (40 mg total) by mouth daily.  sacubitril-valsartan 24-26 MG Commonly known as:  ENTRESTO Take 1 tablet by mouth 2 (two) times daily.  spironolactone 25 MG tablet Commonly known as:  ALDACTONE Take 0.5 tablets (12.5 mg total) by mouth daily.        Follow-up Information    [facility] Follow up on 05/26/2016.   Specialty:  Internal Medicine Why:  At 11am  for hospital follow up  Contact information: [facility] Follow up on 04/15/2016.   Why:  Please report at 630 am for Heart catheterization at 830 am.  Contact information: [facility] [id number] [place] Follow up on 04/29/2016.   Specialty:  Cardiology Why:  at 1000 am for post hospital follow. Please bring all of your medications to your visit. The code for parking is 0030. Contact information: [address] [id number] [place] [phone]         No Known Allergies  Procedures/Studies: Imaging Results   Dg Chest 2 View  Result Date: 04/05/2016 CLINICAL DATA:  Acute onset of shortness of breath and bilateral leg swelling. Abdominal distention. Nausea. Intermittent chest pain. Initial encounter. EXAM: CHEST  2 VIEW COMPARISON:  Chest radiograph performed FINDINGS: The lungs are well-aerated. Vascular congestion is noted. Bibasilar airspace opacities may reflect mild interstitial edema. Prominent left apical airspace opacity is similar in appearance; underlying mass cannot be entirely excluded. There is no evidence of pleural effusion or pneumothorax. The heart is normal in size; the mediastinal contour is within normal limits. No acute osseous abnormalities are seen. IMPRESSION: 1. Vascular congestion noted. Bibasilar airspace opacities may reflect mild interstitial edema. 2. Similar appearance to prominent left apical airspace opacity. Underlying mass cannot be entirely excluded. PET/CT could be considered for further evaluation, as deemed clinically appropriate. Electronically Signed   By: Garald Balding M.D.   On: 04/05/2016 23:20  Subjective: Pt without complaints.  Says that he feels better and much less ankle edema.  NO SOB or CP.    Discharge Exam:     Vitals:   04/07/16 1947 04/08/16 0502  BP: 122/81 (!) 95/54  Pulse: (!) 107 92  Resp: 18 16  Temp: 97.8 F (36.6 C) 97.9 F (36.6 C)         Vitals:   04/07/16 1129 04/07/16 1947 04/07/16 1957 04/08/16 0502  BP: (!) 158/97 122/81  (!) 95/54  Pulse: (!) 113 (!) 107  92  Resp: 18 18  16   Temp: 97.6 F (36.4 C)  97.8 F (36.6 C)  97.9 F (36.6 C)  TempSrc: Oral Oral  Oral  SpO2: 99% 97% 97% 98%  Weight:    114.3 kg (251 lb 14.4 oz)  Height:        General: Pt is alert, awake, not in acute distress Cardiovascular: RRR, S1/S2 +, no rubs, no gallops Respiratory: CTA bilaterally, no wheezing, no rhonchi Abdominal: Soft, NT, ND, bowel sounds + Extremities: no edema, no cyanosis Skin: multiple tattoos upper extremities bilateral.    The results of significant diagnostics from this hospitalization (including imaging, microbiology, ancillary and laboratory) are listed below for reference.     Microbiology: No results found for this or any previous visit (from the past 240 hour(s)).   Labs: BNP (last 3 results)  Recent Labs (within last 365 days)   Recent Labs  04/05/16 2131  BNP 722.6*     Basic Metabolic Panel:  Last Labs    Recent Labs Lab 04/05/16 2131 04/06/16 0106 04/07/16 0428 04/08/16 0532  NA 138  --  140 141  K 4.1  --  4.1 3.4*  CL 105  --  104 104  CO2 26  --  29 30  GLUCOSE 94  --  119* 120*  BUN 10  --  11 10  CREATININE 1.05 1.05 1.02 1.15  CALCIUM 8.8*  --  8.9 9.0  MG  --  1.9 2.3 2.2     Liver Function Tests:  Last Labs    Recent Labs Lab 04/05/16 2131  AST 27  ALT 23  ALKPHOS 91  BILITOT 0.4  PROT 6.7  ALBUMIN 3.2*      Last Labs    Recent Labs Lab 04/05/16 2131  LIPASE 47     Last Labs   No results for input(s): AMMONIA in the last 168 hours.   CBC:  Last Labs    Recent Labs Lab 04/05/16 2131 04/06/16 0106 04/07/16 0428 04/08/16 0532  WBC 6.8 7.6 6.8 7.7  HGB 9.8* 10.2* 10.5* 11.2*  HCT 33.2* 34.7*  33.9* 35.3* 38.5*  MCV 76.9* 75.8* 75.8* 76.2*  PLT 377 412* 407* 445*     Cardiac Enzymes: Last Labs   No results for input(s): CKTOTAL, CKMB, CKMBINDEX, TROPONINI in the last 168 hours.   BNP: Last Labs   Invalid input(s): POCBNP   CBG: Last Labs   No results for input(s): GLUCAP  in the last 168 hours.   D-Dimer  Recent Labs (last 2 labs)    Recent Labs  04/05/16 2131  DDIMER 0.28     Hgb A1c Recent Labs (last 2 labs)   No results for input(s): HGBA1C in the last 72 hours.   Lipid Profile  Recent Labs (last 2 labs)    Recent Labs  04/06/16 0106  CHOL 131  HDL 24*  LDLCALC 82  TRIG 127  CHOLHDL  5.5     Thyroid function studies  Recent Labs (last 2 labs)    Recent Labs  04/06/16 0106  TSH 6.211*     Anemia work up  [employer] (last 2 labs)    Recent Labs  04/06/16 0106  VITAMINB12 485  FERRITIN 13*  TIBC 496*  IRON 27*  RETICCTPCT 1.9     Urinalysis Labs (Brief)          Component Value Date/Time   COLORURINE YELLOW 04/06/2016 0236   APPEARANCEUR CLEAR 04/06/2016 0236   LABSPEC 1.008 04/06/2016 0236   PHURINE 7.0 04/06/2016 0236   GLUCOSEU NEGATIVE 04/06/2016 0236   HGBUR NEGATIVE 04/06/2016 0236   BILIRUBINUR NEGATIVE 04/06/2016 0236   KETONESUR NEGATIVE 04/06/2016 0236   PROTEINUR NEGATIVE 04/06/2016 0236   NITRITE NEGATIVE 04/06/2016 0236   LEUKOCYTESUR NEGATIVE 04/06/2016 0236     Sepsis Labs Last Labs   Invalid input(s): PROCALCITONIN,  WBC,  LACTICIDVEN   Microbiology No results found for this or any previous visit (from the past 240 hour(s)).  Time coordinating discharge: 32 minutes  SIGNED:  Irwin Brakeman, MD                       [facility] 04/08/2016, 11:59 AM Pager   If 7PM-7AM, please contact night-coverage [email or web address] Password [email or web address]

## 2016-04-09 NOTE — Telephone Encounter (Signed)
Attempted to reach patient for TCM/Hospital Follow-up call. Left message for patient to return call when available.    

## 2016-04-10 NOTE — Telephone Encounter (Signed)
Left message x 2 for TCM/Hospital Follow-up call. 

## 2016-04-14 ENCOUNTER — Ambulatory Visit: Payer: Self-pay

## 2016-04-15 ENCOUNTER — Encounter (HOSPITAL_COMMUNITY): Payer: Self-pay | Admitting: Cardiology

## 2016-04-15 ENCOUNTER — Encounter (HOSPITAL_COMMUNITY)
Admission: RE | Disposition: A | Discharge status: Home / Self Care | Payer: Self-pay | Source: Ambulatory Visit | Attending: Cardiology

## 2016-04-15 ENCOUNTER — Ambulatory Visit (HOSPITAL_COMMUNITY)
Admission: RE | Admit: 2016-04-15 | Discharge: 2016-04-15 | Disposition: A | Discharge status: Home / Self Care | Payer: Medicaid Other | Source: Ambulatory Visit | Attending: Cardiology | Admitting: Cardiology

## 2016-04-15 DIAGNOSIS — I2510 Atherosclerotic heart disease of native coronary artery without angina pectoris: Secondary | ICD-10-CM | POA: Diagnosis not present

## 2016-04-15 DIAGNOSIS — Z923 Personal history of irradiation: Secondary | ICD-10-CM | POA: Insufficient documentation

## 2016-04-15 DIAGNOSIS — R0683 Snoring: Secondary | ICD-10-CM | POA: Diagnosis not present

## 2016-04-15 DIAGNOSIS — Z8572 Personal history of non-Hodgkin lymphomas: Secondary | ICD-10-CM | POA: Diagnosis not present

## 2016-04-15 DIAGNOSIS — F17210 Nicotine dependence, cigarettes, uncomplicated: Secondary | ICD-10-CM | POA: Diagnosis not present

## 2016-04-15 DIAGNOSIS — Z9221 Personal history of antineoplastic chemotherapy: Secondary | ICD-10-CM | POA: Insufficient documentation

## 2016-04-15 DIAGNOSIS — I5023 Acute on chronic systolic (congestive) heart failure: Secondary | ICD-10-CM | POA: Insufficient documentation

## 2016-04-15 DIAGNOSIS — I110 Hypertensive heart disease with heart failure: Secondary | ICD-10-CM | POA: Insufficient documentation

## 2016-04-15 DIAGNOSIS — I429 Cardiomyopathy, unspecified: Secondary | ICD-10-CM

## 2016-04-15 DIAGNOSIS — I428 Other cardiomyopathies: Secondary | ICD-10-CM | POA: Insufficient documentation

## 2016-04-15 HISTORY — PX: CARDIAC CATHETERIZATION: SHX172

## 2016-04-15 LAB — POCT I-STAT 3, VENOUS BLOOD GAS (G3P V)
ACID-BASE EXCESS: 2 mmol/L (ref 0.0–2.0)
Acid-Base Excess: 2 mmol/L (ref 0.0–2.0)
BICARBONATE: 27.9 meq/L — AB (ref 20.0–24.0)
BICARBONATE: 28.6 meq/L — AB (ref 20.0–24.0)
O2 Saturation: 54 %
O2 Saturation: 55 %
PCO2 VEN: 50.1 mmHg — AB (ref 45.0–50.0)
PH VEN: 7.354 — AB (ref 7.250–7.300)
PH VEN: 7.363 — AB (ref 7.250–7.300)
PO2 VEN: 30 mmHg — AB (ref 31.0–45.0)
PO2 VEN: 31 mmHg (ref 31.0–45.0)
TCO2: 30 mmol/L (ref 0–100)
TCO2: 29 mmol/L (ref 0–100)
pCO2, Ven: 50.3 mmHg — ABNORMAL HIGH (ref 45.0–50.0)

## 2016-04-15 LAB — CBC
HCT: 39.5 % (ref 39.0–52.0)
HEMOGLOBIN: 11.5 g/dL — AB (ref 13.0–17.0)
MCH: 22.4 pg — AB (ref 26.0–34.0)
MCHC: 29.1 g/dL — ABNORMAL LOW (ref 30.0–36.0)
MCV: 77 fL — AB (ref 78.0–100.0)
PLATELETS: 449 10*3/uL — AB (ref 150–400)
RBC: 5.13 MIL/uL (ref 4.22–5.81)
RDW: 18.3 % — ABNORMAL HIGH (ref 11.5–15.5)
WBC: 7.3 10*3/uL (ref 4.0–10.5)

## 2016-04-15 LAB — PROTIME-INR
INR: 1.04
Prothrombin Time: 13.6 seconds (ref 11.4–15.2)

## 2016-04-15 LAB — BASIC METABOLIC PANEL
ANION GAP: 8 (ref 5–15)
BUN: 12 mg/dL (ref 6–20)
CALCIUM: 9.3 mg/dL (ref 8.9–10.3)
CHLORIDE: 103 mmol/L (ref 101–111)
CO2: 26 mmol/L (ref 22–32)
Creatinine, Ser: 0.97 mg/dL (ref 0.61–1.24)
GFR calc non Af Amer: >60 mL/min (ref >60)
Glucose, Bld: 104 mg/dL — ABNORMAL HIGH (ref 65–99)
Potassium: 3.4 mmol/L — ABNORMAL LOW (ref 3.5–5.1)
Sodium: 137 mmol/L (ref 135–145)

## 2016-04-15 SURGERY — RIGHT/LEFT HEART CATH AND CORONARY ANGIOGRAPHY
Anesthesia: LOCAL

## 2016-04-15 MED ORDER — SODIUM CHLORIDE 0.9% FLUSH: 3.0000 mL | Freq: Two times a day (BID) | INTRAVENOUS | Status: DC

## 2016-04-15 MED ORDER — HEPARIN SODIUM (PORCINE) 1000 UNIT/ML IJ SOLN
INTRAMUSCULAR | Status: DC | PRN
  Administered 2016-04-15: 5000 [IU] via INTRAVENOUS

## 2016-04-15 MED ORDER — SODIUM CHLORIDE 0.9% FLUSH: 3.0000 mL | INTRAVENOUS | Status: DC | PRN

## 2016-04-15 MED ORDER — ASPIRIN 81 MG PO CHEW
CHEWABLE_TABLET | ORAL | Status: AC
  Filled 2016-04-15: qty 1

## 2016-04-15 MED ORDER — ACETAMINOPHEN 325 MG PO TABS: 650.0000 mg | ORAL_TABLET | ORAL | Status: DC | PRN

## 2016-04-15 MED ORDER — ASPIRIN 81 MG PO CHEW
81.0000 mg | CHEWABLE_TABLET | ORAL | Status: AC
  Administered 2016-04-15: 81 mg via ORAL

## 2016-04-15 MED ORDER — HEPARIN (PORCINE) IN NACL 2-0.9 UNIT/ML-% IJ SOLN
INTRAMUSCULAR | Status: DC | PRN
  Administered 2016-04-15: 1500 mL

## 2016-04-15 MED ORDER — SODIUM CHLORIDE 0.9 % IV SOLN: 250.0000 mL | INTRAVENOUS | Status: DC | PRN

## 2016-04-15 MED ORDER — ONDANSETRON HCL 4 MG/2ML IJ SOLN: 4.0000 mg | Freq: Four times a day (QID) | INTRAMUSCULAR | Status: DC | PRN

## 2016-04-15 MED ORDER — HEPARIN SODIUM (PORCINE) 1000 UNIT/ML IJ SOLN
INTRAMUSCULAR | Status: AC
  Filled 2016-04-15: qty 1

## 2016-04-15 MED ORDER — POTASSIUM CHLORIDE CRYS ER 20 MEQ PO TBCR
40.0000 meq | EXTENDED_RELEASE_TABLET | Freq: Once | ORAL | Status: AC
  Administered 2016-04-15: 40 meq via ORAL

## 2016-04-15 MED ORDER — IOPAMIDOL (ISOVUE-370) INJECTION 76%
INTRAVENOUS | Status: DC | PRN
  Administered 2016-04-15: 70 mL via INTRA_ARTERIAL

## 2016-04-15 MED ORDER — IOPAMIDOL (ISOVUE-370) INJECTION 76%
INTRAVENOUS | Status: AC
  Filled 2016-04-15: qty 100

## 2016-04-15 MED ORDER — SODIUM CHLORIDE 0.9 % IV SOLN
250.0000 mL | INTRAVENOUS | Status: DC | PRN
  Administered 2016-04-15: 250 mL via INTRAVENOUS

## 2016-04-15 MED ORDER — HEPARIN (PORCINE) IN NACL 2-0.9 UNIT/ML-% IJ SOLN
INTRAMUSCULAR | Status: DC | PRN
  Administered 2016-04-15: 09:00:00 via INTRA_ARTERIAL

## 2016-04-15 MED ORDER — LIDOCAINE HCL (PF) 1 % IJ SOLN
INTRAMUSCULAR | Status: AC
  Filled 2016-04-15: qty 30

## 2016-04-15 MED ORDER — POTASSIUM CHLORIDE CRYS ER 20 MEQ PO TBCR
EXTENDED_RELEASE_TABLET | ORAL | Status: AC
  Filled 2016-04-15: qty 2

## 2016-04-15 MED ORDER — LIDOCAINE HCL (PF) 1 % IJ SOLN
INTRAMUSCULAR | Status: DC | PRN
  Administered 2016-04-15: 8 mL via SUBCUTANEOUS

## 2016-04-15 MED ORDER — HEPARIN (PORCINE) IN NACL 2-0.9 UNIT/ML-% IJ SOLN
INTRAMUSCULAR | Status: AC
  Filled 2016-04-15: qty 1000

## 2016-04-15 MED ORDER — VERAPAMIL HCL 2.5 MG/ML IV SOLN
INTRAVENOUS | Status: AC
  Filled 2016-04-15: qty 2

## 2016-04-15 MED ORDER — SODIUM CHLORIDE 0.9 % IV SOLN
INTRAVENOUS | Status: DC
  Administered 2016-04-15: 07:00:00 via INTRAVENOUS

## 2016-04-15 MED FILL — KLOR-CON M20 TABLET: 20 | 30 days supply | Qty: 60 | Fill #0

## 2016-04-15 MED FILL — DIGITEK 125 MCG TABLET: 125 | 30 days supply | Qty: 30 | Fill #0

## 2016-04-15 SURGICAL SUPPLY — 18 items
CATH BALLN WEDGE 5F 110CM (CATHETERS) ×2 IMPLANT
CATH INFINITI 5 FR JL3.5 (CATHETERS) ×2 IMPLANT
CATH INFINITI 5FR ANG PIGTAIL (CATHETERS) ×2 IMPLANT
CATH INFINITI JR4 5F (CATHETERS) ×2 IMPLANT
CATH LAUNCHER 5F EBU3.0 (CATHETERS) ×1 IMPLANT
CATH LAUNCHER 5F JL3 (CATHETERS) ×1 IMPLANT
CATHETER LAUNCHER 5F EBU3.0 (CATHETERS) ×2
CATHETER LAUNCHER 5F JL3 (CATHETERS) ×2
DEVICE RAD COMP TR BAND LRG (VASCULAR PRODUCTS) ×2 IMPLANT
GLIDESHEATH SLEND SS 6F .021 (SHEATH) ×2 IMPLANT
KIT HEART LEFT (KITS) ×2 IMPLANT
KIT HEART RIGHT NAMIC (KITS) ×2 IMPLANT
PACK CARDIAC CATHETERIZATION (CUSTOM PROCEDURE TRAY) ×2 IMPLANT
SHEATH FAST CATH BRACH 5F 5CM (SHEATH) ×2 IMPLANT
SYR MEDRAD MARK V 150ML (SYRINGE) ×2 IMPLANT
TRANSDUCER W/STOPCOCK (MISCELLANEOUS) ×4 IMPLANT
TUBING CIL FLEX 10 FLL-RA (TUBING) ×2 IMPLANT
WIRE SAFE-T 1.5MM-J .035X260CM (WIRE) ×2 IMPLANT

## 2016-04-15 NOTE — Interval H&P Note (Signed)
History and Physical Interval Note:  04/15/2016 8:48 AM  Leonard [name]  has presented today for surgery, with the diagnosis of Cardiomyopathy  The various methods of treatment have been discussed with the patient and family. After consideration of risks, benefits and other options for treatment, the patient has consented to  Procedure(s): Right/Left Heart Cath and Coronary Angiography (N/A) as a surgical intervention .  The patient's history has been reviewed, patient examined, no change in status, stable for surgery.  I have reviewed the patient's chart and labs.  Questions were answered to the patient's satisfaction.     Eural Holzschuh [employer]

## 2016-04-15 NOTE — H&P (View-Only) (Signed)
Advanced Heart Failure Rounding Note  Referring Physician: Dr Darrick Meigs Primary Physician: None currently ( Setting up with [facility]) Primary Cardiologist:  New  Reason for Consultation: New onset HF  Subjective:    Echo 04/07/16 LVEF 25-30%, Grade 2 DD, Peak PA pressure 47 mm Hg.   Continues to feel better. Having occasional dizziness ( which is close to his baseline) on Entresto.  Passes quickly and is not limiting.  No CP or DOE this am. Adamant that he needs to leave today.   I/O negative 4L and down 4 lbs.  Creatinine stable K 3.1.  Objective:   Weight Range: 251 lb 14.4 oz (114.3 kg) Body mass index is 34.16 kg/m.   Vital Signs:   Temp:  [97.6 F (36.4 C)-97.9 F (36.6 C)] 97.9 F (36.6 C) (08/16 0502) Pulse Rate:  [92-113] 92 (08/16 0502) Resp:  [16-18] 16 (08/16 0502) BP: (95-158)/(54-97) 95/54 (08/16 0502) SpO2:  [97 %-99 %] 98 % (08/16 0502) Weight:  [251 lb 14.4 oz (114.3 kg)] 251 lb 14.4 oz (114.3 kg) (08/16 0502) Last BM Date: 04/05/16  Weight change: Filed Weights   04/06/16 0523 04/07/16 0545 04/08/16 0502  Weight: 257 lb 15 oz (117 kg) 256 lb 3.2 oz (116.2 kg) 251 lb 14.4 oz (114.3 kg)    Intake/Output:   Intake/Output Summary (Last 24 hours) at 04/08/16 1022 Last data filed at 04/08/16 0820  Gross per 24 hour  Intake             1160 ml  Output             3760 ml  Net            -2600 ml     Physical Exam: General:  Well appearing. No resp difficulty HEENT: normal Neck: supple. JVP 10 cm. Carotids 2+ bilat; no bruits. No thyromegaly or nodule noted.  Cor: PMI nondisplaced. Remains slightly tachy but regular, +S3. Lungs: Clear Abdomen: Obese, soft, NT, ND, no HSM. No bruits or masses. +BS  Extremities: no cyanosis, clubbing, rash. Trace ankle edema.   Neuro: alert & orientedx3, cranial nerves grossly intact. moves all 4 extremities w/o difficulty. Affect pleasant  Telemetry: reviewed personally, Remains mildly tachy  90-110s Labs: CBC  Recent Labs  04/07/16 0428 04/08/16 0532  WBC 6.8 7.7  HGB 10.5* 11.2*  HCT 35.3* 38.5*  MCV 75.8* 76.2*  PLT 407* [id number]*   Basic Metabolic Panel  Recent Labs  04/07/16 0428 04/08/16 0532  NA 140 141  K 4.1 3.4*  CL 104 104  CO2 29 30  GLUCOSE 119* 120*  BUN 11 10  CREATININE 1.02 1.15  CALCIUM 8.9 9.0  MG 2.3 2.2   Liver Function Tests  Recent Labs  04/05/16 2131  AST 27  ALT 23  ALKPHOS 91  BILITOT 0.4  PROT 6.7  ALBUMIN 3.2*    Recent Labs  04/05/16 2131  LIPASE 47   Cardiac Enzymes No results for input(s): CKTOTAL, CKMB, CKMBINDEX, TROPONINI in the last 72 hours.  BNP: BNP (last 3 results)  Recent Labs  04/05/16 2131  BNP 722.6*    ProBNP (last 3 results) No results for input(s): PROBNP in the last 8760 hours.   D-Dimer  Recent Labs  04/05/16 2131  DDIMER 0.28   Hemoglobin A1C No results for input(s): HGBA1C in the last 72 hours. Fasting Lipid Panel  Recent Labs  04/06/16 0106  CHOL 131  HDL 24*  LDLCALC 82  TRIG 127  CHOLHDL 5.5   Thyroid Function Tests  Recent Labs  04/06/16 0106  TSH 6.211*    Other results:     Imaging/Studies:   No results found.  Latest Echo  Latest Cath   Medications:     Scheduled Medications: . carvedilol  3.125 mg Oral BID WC  . enoxaparin (LOVENOX) injection  60 mg Subcutaneous Q24H  . ferrous sulfate  325 mg Oral TID WC  . furosemide  40 mg Intravenous BID  . gabapentin  800 mg Oral TID  . levothyroxine  50 mcg Oral QAC breakfast  . mometasone-formoterol  2 puff Inhalation BID  . montelukast  10 mg Oral QHS  . pantoprazole  40 mg Oral Daily  . potassium chloride  40 mEq Oral Once  . sacubitril-valsartan  1 tablet Oral BID  . sodium chloride flush  3 mL Intravenous Q12H  . spironolactone  12.5 mg Oral Daily     Infusions:     PRN Medications:  sodium chloride, acetaminophen, albuterol, ondansetron (ZOFRAN) IV, sodium chloride  flush   Assessment/Plan   Derby Wittman is a 40 y.o. male with new onset CHF. HF team consulted for same. He has history of adriamycin exposure for Non-Hodgkins Lymphoma.   1. New onset combined CHF - Echo 04/07/16 LVEF 25-30%, Grade 2 DD, Peak PA pressure 47 mm Hg.  - He remains volume overloaded on exam.  Lasix 40 mg IV bid.   - Set up for [facility] 04/15/16 at 830 am.  Knows to report to hospital at 630 am and to be NPO p midnight.  - Negative Hepatitis and HIV panels - Pending ANA, RF, SPEP, and IFE Urine for further evaluation of NICM.  - Continue current medications. - Could likely benefit from cMRI if NICM.  2. HTN - Will adjust his medications in the setting of treating his HF.  - Meds as below for home.  3. H/o Non-Hodgkins Lymphoma -  Has previously gotten adriamycin as part of treatment. Likely contributing factor.  4. Snoring - Snores badly with + apneic episodes.  Had an inconclusive sleep study in the past.   - Needs repeat Sleep study once insurance arranged. 6. Social -  To be seen about [place] as outpatient. (has appointment per case management.  - HF meds will be provided by HF fund, and Entreso will be arranged via patients assistance after 30 day free picked up.   He is tenuous but stable for d/c today with Cath next week and close follow up.   HF meds for home Coreg 3.125 mg BID Entresto 24/26 mg BID Lasix 40 mg daily Spironolactone 12.5 mg daily  Length of Stay: 3  Shirley Friar PA-C 04/08/2016, 10:22 AM  Advanced Heart Failure Team Pager [phone] (M-F; 7a - 4p)  Please contact [facility] for night-coverage after hours (4p -7a ) and weekends on [email or web address]  Patient seen with PA, agree with the above note.   1. Acute on chronic systolic CHF: EF [id number] with diffuse hypokinesis.  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  Also has had poorly controlled HTN.  Had prior radiation to chest and was a  smoker, so CAD is not out of the question.  Good diuresis, volume status improved on exam.  - Transition to Lasix 40 mg po daily for home.   - Continue current Coreg, Entresto, and spironolactone.  No BP room to titrate further at this time.  He  will stop amlodipine at home.  - Needs RHC/LHC.  He has to go home today so he does not lose his job.  Will plan for cath next Wednesday (arranged).   2. HTN: ?Contributor to cardiomyopathy.  BP now controlled on current regimen.  3. Suspect OSA: Will arrange sleep study as outpatient.  4. Needs social work assistance with meds.   He will go home today on the above meds.  Followup for cath next Wednesday with me.  Followup in office about 2 wks later.  BMET when he gets cath.   Loralie Champagne 04/08/2016 12:58 PM

## 2016-04-15 NOTE — Discharge Instructions (Signed)
Radial Site Care °Refer to this sheet in the next few weeks. These instructions provide you with information about caring for yourself after your procedure. Your health care provider may also give you more specific instructions. Your treatment has been planned according to current medical practices, but problems sometimes occur. Call your health care provider if you have any problems or questions after your procedure. °WHAT TO EXPECT AFTER THE PROCEDURE °After your procedure, it is typical to have the following: °· Bruising at the radial site that usually fades within 1-2 weeks. °· Blood collecting in the tissue (hematoma) that may be painful to the touch. It should usually decrease in size and tenderness within 1-2 weeks. °HOME CARE INSTRUCTIONS °· Take medicines only as directed by your health care provider. °· You may shower 24-48 hours after the procedure or as directed by your health care provider. Remove the bandage (dressing) and gently wash the site with plain soap and water. Pat the area dry with a clean towel. Do not rub the site, because this may cause bleeding. °· Do not take baths, swim, or use a hot tub until your health care provider approves. °· Check your insertion site every day for redness, swelling, or drainage. °· Do not apply powder or lotion to the site. °· Do not flex or bend the affected arm for 24 hours or as directed by your health care provider. °· Do not push or pull heavy objects with the affected arm for 24 hours or as directed by your health care provider. °· Do not lift over 10 lb (4.5 kg) for 5 days after your procedure or as directed by your health care provider. °· Ask your health care provider when it is okay to: °¨ Return to work or school. °¨ Resume usual physical activities or sports. °¨ Resume sexual activity. °· Do not drive home if you are discharged the same day as the procedure. Have someone else drive you. °· You may drive 24 hours after the procedure unless otherwise  instructed by your health care provider. °· Do not operate machinery or power tools for 24 hours after the procedure. °· If your procedure was done as an outpatient procedure, which means that you went home the same day as your procedure, a responsible adult should be with you for the first 24 hours after you arrive home. °· Keep all follow-up visits as directed by your health care provider. This is important. °SEEK MEDICAL CARE IF: °· You have a fever. °· You have chills. °· You have increased bleeding from the radial site. Hold pressure on the site. CALL 911 °SEEK IMMEDIATE MEDICAL CARE IF: °· You have unusual pain at the radial site. °· You have redness, warmth, or swelling at the radial site. °· You have drainage (other than a small amount of blood on the dressing) from the radial site. °· The radial site is bleeding, and the bleeding does not stop after 30 minutes of holding steady pressure on the site. °· Your arm or hand becomes pale, cool, tingly, or numb. °  °This information is not intended to replace advice given to you by your health care provider. Make sure you discuss any questions you have with your health care provider. °  °Document Released: 09/12/2010 Document Revised: 08/31/2014 Document Reviewed: 02/26/2014 °Elsevier Interactive Patient Education ©2016 [employer] ° °

## 2016-04-20 ENCOUNTER — Ambulatory Visit: Payer: Self-pay | Attending: Internal Medicine

## 2016-04-28 ENCOUNTER — Ambulatory Visit: Payer: Self-pay

## 2016-04-29 ENCOUNTER — Inpatient Hospital Stay (HOSPITAL_COMMUNITY): Admit: 2016-04-29 | Payer: Self-pay

## 2016-04-30 ENCOUNTER — Encounter (HOSPITAL_COMMUNITY): Payer: Self-pay

## 2016-04-30 ENCOUNTER — Ambulatory Visit (HOSPITAL_COMMUNITY)
Admission: RE | Admit: 2016-04-30 | Discharge: 2016-04-30 | Disposition: A | Discharge status: Home / Self Care | Payer: Medicaid Other | Source: Ambulatory Visit | Attending: Cardiology | Admitting: Cardiology

## 2016-04-30 ENCOUNTER — Encounter (HOSPITAL_COMMUNITY): Payer: Self-pay | Admitting: Pharmacist

## 2016-04-30 VITALS — BP 148/96 | HR 109 | Ht 72.0 in | Wt 245.8 lb

## 2016-04-30 DIAGNOSIS — K219 Gastro-esophageal reflux disease without esophagitis: Secondary | ICD-10-CM | POA: Insufficient documentation

## 2016-04-30 DIAGNOSIS — I2510 Atherosclerotic heart disease of native coronary artery without angina pectoris: Secondary | ICD-10-CM | POA: Diagnosis not present

## 2016-04-30 DIAGNOSIS — G894 Chronic pain syndrome: Secondary | ICD-10-CM

## 2016-04-30 DIAGNOSIS — E079 Disorder of thyroid, unspecified: Secondary | ICD-10-CM | POA: Diagnosis not present

## 2016-04-30 DIAGNOSIS — Z923 Personal history of irradiation: Secondary | ICD-10-CM | POA: Diagnosis not present

## 2016-04-30 DIAGNOSIS — I10 Essential (primary) hypertension: Secondary | ICD-10-CM

## 2016-04-30 DIAGNOSIS — J449 Chronic obstructive pulmonary disease, unspecified: Secondary | ICD-10-CM | POA: Diagnosis not present

## 2016-04-30 DIAGNOSIS — Z8572 Personal history of non-Hodgkin lymphomas: Secondary | ICD-10-CM | POA: Diagnosis not present

## 2016-04-30 DIAGNOSIS — G4730 Sleep apnea, unspecified: Secondary | ICD-10-CM | POA: Diagnosis not present

## 2016-04-30 DIAGNOSIS — I5023 Acute on chronic systolic (congestive) heart failure: Secondary | ICD-10-CM

## 2016-04-30 DIAGNOSIS — Z9221 Personal history of antineoplastic chemotherapy: Secondary | ICD-10-CM | POA: Insufficient documentation

## 2016-04-30 DIAGNOSIS — I5042 Chronic combined systolic (congestive) and diastolic (congestive) heart failure: Secondary | ICD-10-CM | POA: Diagnosis present

## 2016-04-30 DIAGNOSIS — Z809 Family history of malignant neoplasm, unspecified: Secondary | ICD-10-CM | POA: Insufficient documentation

## 2016-04-30 DIAGNOSIS — Z836 Family history of other diseases of the respiratory system: Secondary | ICD-10-CM | POA: Insufficient documentation

## 2016-04-30 DIAGNOSIS — Z87891 Personal history of nicotine dependence: Secondary | ICD-10-CM | POA: Diagnosis not present

## 2016-04-30 DIAGNOSIS — I110 Hypertensive heart disease with heart failure: Secondary | ICD-10-CM | POA: Diagnosis not present

## 2016-04-30 DIAGNOSIS — R0683 Snoring: Secondary | ICD-10-CM

## 2016-04-30 DIAGNOSIS — E038 Other specified hypothyroidism: Secondary | ICD-10-CM

## 2016-04-30 LAB — BASIC METABOLIC PANEL
Anion gap: 9 (ref 5–15)
BUN: 10 mg/dL (ref 6–20)
CALCIUM: 9.6 mg/dL (ref 8.9–10.3)
CO2: 25 mmol/L (ref 22–32)
CREATININE: 1.01 mg/dL (ref 0.61–1.24)
Chloride: 106 mmol/L (ref 101–111)
GFR calc Af Amer: >60 mL/min (ref >60)
GLUCOSE: 102 mg/dL — AB (ref 65–99)
Potassium: 4.1 mmol/L (ref 3.5–5.1)
Sodium: 140 mmol/L (ref 135–145)

## 2016-04-30 MED ORDER — GABAPENTIN 800 MG PO TABS: 800.0000 mg | ORAL_TABLET | Freq: Three times a day (TID) | ORAL | 0 refills | Status: DC

## 2016-04-30 MED ORDER — SACUBITRIL-VALSARTAN 24-26 MG PO TABS: 1.0000 | ORAL_TABLET | Freq: Two times a day (BID) | ORAL | 11 refills | Status: DC

## 2016-04-30 MED ORDER — CARVEDILOL 6.25 MG PO TABS: 6.2500 mg | ORAL_TABLET | Freq: Two times a day (BID) | ORAL | 3 refills | Status: DC

## 2016-04-30 MED ORDER — PANTOPRAZOLE SODIUM 40 MG PO TBEC: 40.0000 mg | DELAYED_RELEASE_TABLET | Freq: Every day | ORAL | 0 refills | Status: DC

## 2016-04-30 MED ORDER — LEVOTHYROXINE SODIUM 50 MCG PO TABS: 50.0000 ug | ORAL_TABLET | Freq: Every day | ORAL | 0 refills | Status: DC

## 2016-04-30 MED ORDER — HYDROCODONE-ACETAMINOPHEN 5-325 MG PO TABS: 1.0000 | ORAL_TABLET | ORAL | 0 refills | Status: DC | PRN

## 2016-04-30 MED ORDER — FUROSEMIDE 40 MG PO TABS: 40.0000 mg | ORAL_TABLET | Freq: Two times a day (BID) | ORAL | 3 refills | Status: DC

## 2016-04-30 MED ORDER — MONTELUKAST SODIUM 10 MG PO TABS: 10.0000 mg | ORAL_TABLET | Freq: Every day | ORAL | 0 refills | Status: DC

## 2016-04-30 NOTE — Progress Notes (Signed)
Advanced Heart Failure Clinic Note   Primary Care: None per patient Primary Cardiologist: Dr. Aundra Dubin   HPI:  Leonard [name] is a 40 y.o. male with history of non-Hodgkin's lymphoma s/p Chemoradiation at [facility] - currently in remission, HTN, Asthma, GERD, and chronic pain.    He presented to [facility] 04/05/16 with worsening SOB and peripheral edema x 2 months, worse over the past two weeks. Also c/o of abdominal distention and orthopnea.  Pertinent labs on admission include K  4.1, Creatinine 1.05, BNP 722.6, Negative troponin. CXR showed vascular congestion and bibasilar airspace opacities.   Echo 04/07/16 LVEF 25-30%, Grade 2 DD, Mild AR, Mild MR, PA peak pressure 47 mm Hg  He presents today for post hospital/post cath follow up. Down 6 lbs from discharge Overall feeling better, as far as breathing, but has been uncomfortable as he is been out of some of his medications, including pain medications for 1-2 weeks. Feels like he has more energy.  Hasn't been checking BP at home. Denies lightheadedness or dizziness. Has chronic, atypical chest pain. Weighing daily, weight at home ~ 240-245. Watched salt and fluid, sometimes difficult with his job. 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   Social History: Currently in between jobs  Lives in House with Wife, Mother in [place], Daughter, and step-Daughter.  Has another baby on the way.  (Will be his 9th kid overall). Recently started job in a warehouse.  Will get benefits in 90 days from start.    Family History: No significant cardiovascular history.  Mom passed from Lung CA.   Past Medical History  [facility] 04/15/16  Dominance: Right  Left Main  Short, no significant CAD.  Left Anterior Descending  20-30% proximal LAD stenosis prior to small D1.  Left Circumflex  No significant CAD.  Right Coronary Artery  No significant CAD.   Hemodynamics (mmHg) RA mean 13 RV 52/17 PA 55/23, mean 38 PCWP mean 26 Oxygen  saturations: PA 55% AO 98% Cardiac Output (Fick) 4.55  Cardiac Index (Fick) 1.98 PVR 2.6 WU  Past Medical History:  Diagnosis Date  . Asthma   . Blood transfusion without reported diagnosis   . CHF (congestive heart failure) ([place]) 04/05/2016  . COPD (chronic obstructive pulmonary disease) ([place])   . Hypertension   . Lymphoma ([place])    x 2  . Sleep apnea   . Thyroid disease    Current Outpatient Prescriptions on File Prior to Encounter  Medication Sig Dispense Refill  . acetaminophen (TYLENOL) 500 MG tablet Take 1 tablet (500 mg total) by mouth every 6 (six) hours as needed. (Patient taking differently: Take 1,000 mg by mouth every 6 (six) hours as needed (pain). ) 30 tablet 0  . albuterol (PROVENTIL HFA;VENTOLIN HFA) 108 (90 Base) MCG/ACT inhaler Inhale 2 puffs into the lungs every 6 (six) hours as needed for wheezing or shortness of breath. 3 Inhaler 3  . carvedilol (COREG) 3.125 MG tablet Take 1 tablet (3.125 mg total) by mouth 2 (two) times daily with a meal. 60 tablet 0  . ferrous sulfate 325 (65 FE) MG tablet Take 1 tablet (325 mg total) by mouth daily with breakfast. 30 tablet 0  . Fluticasone-Salmeterol (ADVAIR) 250-50 MCG/DOSE AEPB Inhale 2 puffs into the lungs 2 (two) times daily.    Marland Kitchen gabapentin (NEURONTIN) 800 MG tablet Take 1 tablet (800 mg total) by mouth 3 (three) times daily. 270 tablet 1  . HYDROcodone-acetaminophen (LORTAB) 5-325 MG tablet Take 1  tablet by mouth every 4 (four) hours as needed for moderate pain. 90 tablet 0  . pantoprazole (PROTONIX) 40 MG tablet Take 1 tablet (40 mg total) by mouth daily. 90 tablet 3  . sacubitril-valsartan (ENTRESTO) 24-26 MG Take 1 tablet by mouth 2 (two) times daily. 60 tablet 0  . spironolactone (ALDACTONE) 25 MG tablet Take 0.5 tablets (12.5 mg total) by mouth daily. 30 tablet 0  . albuterol (PROVENTIL) (2.5 MG/3ML) 0.083% nebulizer solution Take 3 mLs (2.5 mg total) by nebulization every 6 (six) hours as needed for wheezing or  shortness of breath. (Patient not taking: Reported on 04/05/2016) 75 mL 12  . levothyroxine (SYNTHROID, LEVOTHROID) 50 MCG tablet Take 1 tablet (50 mcg total) by mouth daily before breakfast. (Patient not taking: Reported on 04/30/2016) 90 tablet 3  . montelukast (SINGULAIR) 10 MG tablet Take 1 tablet (10 mg total) by mouth at bedtime. (Patient not taking: Reported on 04/30/2016) 90 tablet 3   No current facility-administered medications on file prior to encounter.    No Known Allergies  Social History   Social History  . Marital status: Single    Spouse name: N/A  . Number of children: N/A  . Years of education: N/A   Occupational History  . Not on file.   Social History Main Topics  . Smoking status: Former Smoker    Packs/day: 1.25    Years: 10.00    Types: Cigarettes    Quit date: 04/08/2007  . Smokeless tobacco: Current User    Types: Snuff  . Alcohol use No  . Drug use: No  . Sexual activity: Yes   Other Topics Concern  . Not on file   Social History Narrative  . No narrative on file   Family History  Problem Relation Age of Onset  . Cancer Mother   . Emphysema Mother   . Bronchitis Mother     Vitals:   04/30/16 1358  BP: (!) 148/96  Pulse: (!) 109  SpO2: 97%  Weight: 245 lb 12.8 oz (111.5 kg)  Height: 6' (1.829 m)   Wt Readings from Last 3 Encounters:  04/30/16 245 lb 12.8 oz (111.5 kg)  04/15/16 240 lb (108.9 kg)  04/08/16 251 lb 14.4 oz (114.3 kg)    PHYSICAL EXAM: General: Well appearing. No resp difficulty HEENT: normal -Neck: supple. JVP 7-8 cm. Carotids 2+ bilat; no bruits. No thyromegaly or nodule noted.  Cor: PMI nondisplaced. Regular, but slightly tachy. +S3. Lungs: CTAB, normal effort.  Abdomen: Obese, soft, NT, ND, no HSM. No bruits or masses. +BS  Extremities: no cyanosis, clubbing, rash. Trace ankle edema.   Neuro: alert & orientedx3, cranial nerves grossly intact. moves all 4 extremities w/o difficulty. Affect pleasant  ASSESSMENT &  PLAN:  1. Chronic combined CHF - Diagnosed 03/2016- Echo 04/07/16 LVEF 25-30%, Grade 2 DD, Peak PA pressure 47 mm Hg.  - RHC 04/15/16 with marginal cardiac output and RA 13.  - Continue Lasix 40 mg po BID with KCL 40 meq. BMET today.  - Increase coreg to 6.25 mg BID. Titrate slowly with marginal cardiac output on cath.  - Negative Hepatitis and HIV panels. Negative ANA and RF.  Normal UPEP, No M spike on SPEP.  2. HTN - Will adjust his medications in the setting of treating his HF.  - Increase coreg as above.  - Meds as below for home.  3. H/o Non-Hodgkins Lymphoma - Has previously gotten adriamycin as part of treatment. Likely contributing factor.  4. Snoring - Snores badly with + apneic episodes. Had an inconclusive sleep study in the past.  - Will eventually need sleep study.   We will give one month supply of Protonix, singular, Synthroid, and Neurontin.  Further per PCP once established. We will give #30 Lortab.    With complicated work schedule (Afternoon options not ideal).  Will follow up in 2 weeks on PA/NP side for med titration.   Will have social work see today. Will have Pharm-D see for Entresto.   Shirley Friar, PA-C 04/30/16   Total time spent > 25 minutes. Over half that spent discussing the above.

## 2016-04-30 NOTE — Progress Notes (Signed)
CSW referred to assist with PCP. Patient reports he is working part time and hopeful to go full time in the near future. Patient will also begin receiving health benefits in the next 2 months or so. In the meantime, he has applied for the orange card and has some additional paperwork to complete before determination. Patient reports he married and has 7 children with the 8th on its way. He spoke of lengthy family history of cancer and his own bout with cancer. 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 CSW provided supportive intervention and provided information regarding orange card follow up. CSW will continue to follow as needed. Raquel Sarna, LCSW [phone]

## 2016-04-30 NOTE — Patient Instructions (Signed)
CONTINUE Lasix 40 mg, one tab twice a day INCREASE Coreg to 6.25 mg, one tab twice a day  Labs today  Your physician recommends that you schedule a follow-up appointment in: 2 weeks

## 2016-05-01 ENCOUNTER — Telehealth (HOSPITAL_COMMUNITY): Payer: Self-pay | Admitting: Pharmacist

## 2016-05-01 NOTE — Telephone Encounter (Signed)
[employer] patient assistance approved for [employer] 24-26 mg BID through 04/30/17.   Ruta Hinds. Velva Harman, PharmD, BCPS, CPP Clinical Pharmacist Pager: [phone] Phone: [phone] 05/01/2016 4:00 PM

## 2016-05-05 ENCOUNTER — Telehealth (HOSPITAL_COMMUNITY): Payer: Self-pay | Admitting: Vascular Surgery

## 2016-05-05 ENCOUNTER — Encounter: Payer: Self-pay | Admitting: Family Medicine

## 2016-05-05 DIAGNOSIS — G894 Chronic pain syndrome: Secondary | ICD-10-CM

## 2016-05-05 DIAGNOSIS — K219 Gastro-esophageal reflux disease without esophagitis: Secondary | ICD-10-CM

## 2016-05-05 NOTE — Telephone Encounter (Signed)
Left pt message to move appt time up per Jonni Sanger

## 2016-05-06 MED ORDER — PANTOPRAZOLE SODIUM 40 MG PO TBEC: 40.0000 mg | DELAYED_RELEASE_TABLET | Freq: Every day | ORAL | 3 refills | Status: DC

## 2016-05-06 MED ORDER — HYDROCODONE-ACETAMINOPHEN 5-325 MG PO TABS: 1.0000 | ORAL_TABLET | ORAL | 0 refills | Status: DC | PRN

## 2016-05-08 MED FILL — GABAPENTIN 800 MG TABLET: 800 | 30 days supply | Qty: 90 | Fill #0

## 2016-05-12 ENCOUNTER — Encounter (HOSPITAL_COMMUNITY): Payer: Self-pay

## 2016-05-15 ENCOUNTER — Ambulatory Visit: Payer: Self-pay

## 2016-05-15 ENCOUNTER — Ambulatory Visit: Payer: Self-pay | Admitting: Family Medicine

## 2016-05-18 MED FILL — DIGITEK 125 MCG TABLET: 125 | 30 days supply | Qty: 30 | Fill #1

## 2016-05-18 MED FILL — FUROSEMIDE 40 MG TABLET: 40 | 34 days supply | Qty: 34 | Fill #1

## 2016-05-18 MED FILL — SPIRONOLACTONE 25 MG TABLET: 25 | 34 days supply | Qty: 17 | Fill #1

## 2016-05-26 ENCOUNTER — Ambulatory Visit: Payer: Self-pay | Admitting: Family Medicine

## 2016-06-04 ENCOUNTER — Other Ambulatory Visit (HOSPITAL_COMMUNITY): Payer: Self-pay | Admitting: Student

## 2016-06-04 ENCOUNTER — Telehealth: Payer: Self-pay | Admitting: Family Medicine

## 2016-06-04 ENCOUNTER — Other Ambulatory Visit: Payer: Self-pay | Admitting: Family Medicine

## 2016-06-04 ENCOUNTER — Ambulatory Visit: Payer: Self-pay | Admitting: Family Medicine

## 2016-06-04 DIAGNOSIS — E038 Other specified hypothyroidism: Secondary | ICD-10-CM

## 2016-06-04 DIAGNOSIS — I10 Essential (primary) hypertension: Secondary | ICD-10-CM

## 2016-06-04 DIAGNOSIS — G894 Chronic pain syndrome: Secondary | ICD-10-CM

## 2016-06-04 NOTE — Telephone Encounter (Signed)
No charge is fine 

## 2016-06-04 NOTE — Telephone Encounter (Signed)
Patient is currently out of town due to work, patient cancelled 4pm appointment today and [facility] to Monday 06/08/16. Charge or no charge

## 2016-06-05 ENCOUNTER — Other Ambulatory Visit: Payer: Self-pay | Admitting: Family Medicine

## 2016-06-05 DIAGNOSIS — G894 Chronic pain syndrome: Secondary | ICD-10-CM

## 2016-06-05 NOTE — Telephone Encounter (Signed)
Called and [facility] for [place]- I will refill this for him on Monday morning

## 2016-06-05 NOTE — Telephone Encounter (Signed)
Patient's wife [name]) is requesting a refill of patient's HYDROcodone-acetaminophen (LORTAB) 5-325 MG tablet for him. Please advise.    Patient phone: [phone]

## 2016-06-05 NOTE — Telephone Encounter (Signed)
Relation to [id number] Call back number: [phone]   Reason for call:  Patient checking on the status of medication refill best # [phone]

## 2016-06-08 ENCOUNTER — Encounter: Payer: Self-pay | Admitting: Family Medicine

## 2016-06-08 ENCOUNTER — Other Ambulatory Visit: Payer: Self-pay | Admitting: Emergency Medicine

## 2016-06-08 ENCOUNTER — Ambulatory Visit (INDEPENDENT_AMBULATORY_CARE_PROVIDER_SITE_OTHER): Payer: Self-pay | Admitting: Family Medicine

## 2016-06-08 ENCOUNTER — Ambulatory Visit (HOSPITAL_BASED_OUTPATIENT_CLINIC_OR_DEPARTMENT_OTHER)
Admission: RE | Admit: 2016-06-08 | Discharge: 2016-06-08 | Disposition: A | Discharge status: Home / Self Care | Payer: Medicaid Other | Source: Ambulatory Visit | Attending: Family Medicine | Admitting: Family Medicine

## 2016-06-08 VITALS — BP 132/92 | HR 98 | Temp 98.2°F | Resp 17 | Ht 74.0 in | Wt 254.8 lb

## 2016-06-08 DIAGNOSIS — C8590 Non-Hodgkin lymphoma, unspecified, unspecified site: Secondary | ICD-10-CM

## 2016-06-08 DIAGNOSIS — L237 Allergic contact dermatitis due to plants, except food: Secondary | ICD-10-CM

## 2016-06-08 DIAGNOSIS — I10 Essential (primary) hypertension: Secondary | ICD-10-CM

## 2016-06-08 DIAGNOSIS — G894 Chronic pain syndrome: Secondary | ICD-10-CM

## 2016-06-08 DIAGNOSIS — E038 Other specified hypothyroidism: Secondary | ICD-10-CM

## 2016-06-08 DIAGNOSIS — Z23 Encounter for immunization: Secondary | ICD-10-CM

## 2016-06-08 DIAGNOSIS — J984 Other disorders of lung: Secondary | ICD-10-CM | POA: Diagnosis not present

## 2016-06-08 MED ORDER — HYDROCODONE-ACETAMINOPHEN 5-325 MG PO TABS: 1.0000 | ORAL_TABLET | ORAL | 0 refills | Status: DC | PRN

## 2016-06-08 MED ORDER — MONTELUKAST SODIUM 10 MG PO TABS: 10.0000 mg | ORAL_TABLET | Freq: Every day | ORAL | 2 refills | Status: DC

## 2016-06-08 MED ORDER — TRIAMCINOLONE ACETONIDE 0.1 % EX CREA: 1.0000 "application " | TOPICAL_CREAM | Freq: Two times a day (BID) | CUTANEOUS | 0 refills | Status: DC

## 2016-06-08 MED ORDER — LEVOTHYROXINE SODIUM 50 MCG PO TABS: 50.0000 ug | ORAL_TABLET | Freq: Every day | ORAL | 2 refills | Status: DC

## 2016-06-08 MED ORDER — GABAPENTIN 800 MG PO TABS: 800.0000 mg | ORAL_TABLET | Freq: Three times a day (TID) | ORAL | 3 refills | Status: DC

## 2016-06-08 NOTE — Patient Instructions (Signed)
Please have your chest x-ray downstairs, then you may go home. I will be in touch with your results asap

## 2016-06-08 NOTE — Progress Notes (Signed)
[place] at [facility] [address], [place], [place] [address] [phone]  Date:  06/08/2016   Name:  Leonard [name]   DOB:  [birth year]   MRN:  [id number]  PCP:  Lamar Blinks, MD    Chief Complaint: Medication follow up   History of Present Illness:  Leonard [name] is a 40 y.o. very pleasant male patient who presents with the following:  He has a history of non- hodgkin's lymphoma, and then was hospitalized with new CHF in August. EF 25- 30% . He has been doing some odd jobs since he lost his other job. 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   He notes that sometimes when he is bending over and lifting things a lot he might be about to pass out.  He will sometimes have some neck pain.  He feels like he might have a pinched nerve in his left neck and wonders if this is why he feels like he has pre-syncope.  Would like to have imaging to further evaluate this issue but cost is a big concern  We are treating his chronic pain; here today to discuss meds and get refills He lost his insurance as he was laid off.  He has not really been following up with oncology- noted a CT chest in his chart from February-   IMPRESSION: Collapse and consolidation of the left upper lung. This is likely to represent pneumonia but occult obstructing lesion is not excluded. Recommend followup after resolution of acute process. No mediastinal Lymphadenopathy.  So far he has not had follow-up CT chest He is using the wellness center off of [address] for most of his health care now as it is more cost- effective.   He is trying to get an orange card.  He has thought about disability but hopes he will be able to get back to work instead.   Would like to get a flu shot today Also he has noted a likely PI rash on his left arm and a bit on his right arm for 4-5 days.  It is quite itchy.  He was not sure what to use  on it Patient Active Problem List   Diagnosis Date Noted  . History of non-Hodgkin's lymphoma 04/30/2016  . Snoring 04/30/2016  . Shortness of breath   . CHF (congestive heart failure) ([place]) 04/05/2016  . Lymphoma ([place]) 12/11/2015  . Abnormal chest x-ray 10/09/2015  . Pneumonia 10/09/2015  . Essential hypertension 10/09/2015    Past Medical History:  Diagnosis Date  . Asthma   . Blood transfusion without reported diagnosis   . CHF (congestive heart failure) ([place]) 04/05/2016  . COPD (chronic obstructive pulmonary disease) ([place])   . Hypertension   . Lymphoma ([place])    x 2  . Sleep apnea   . Thyroid disease     Past Surgical History:  Procedure Laterality Date  . CARDIAC CATHETERIZATION N/A 04/15/2016   Procedure: Right/Left Heart Cath and Coronary Angiography;  Surgeon: Larey Dresser, MD;  Location: [place] [facility];  Service: Cardiovascular;  Laterality: N/A;  . tumor biopsy    . WISDOM TOOTH EXTRACTION      Social History  Substance Use Topics  . Smoking status: Former Smoker    Packs/day: 1.25    Years: 10.00    Types: Cigarettes    Quit date: 04/08/2007  . Smokeless tobacco: Current User  Types: Snuff  . Alcohol use No    Family History  Problem Relation Age of Onset  . Cancer Mother   . Emphysema Mother   . Bronchitis Mother     No Known Allergies  Medication list has been reviewed and updated.  Current Outpatient Prescriptions on File Prior to Visit  Medication Sig Dispense Refill  . acetaminophen (TYLENOL) 500 MG tablet Take 1 tablet (500 mg total) by mouth every 6 (six) hours as needed. (Patient taking differently: Take 1,000 mg by mouth every 6 (six) hours as needed (pain). ) 30 tablet 0  . albuterol (PROVENTIL HFA;VENTOLIN HFA) 108 (90 Base) MCG/ACT inhaler Inhale 2 puffs into the lungs every 6 (six) hours as needed for wheezing or shortness of breath. 3 Inhaler 3  . albuterol (PROVENTIL) (2.5 MG/3ML) 0.083% nebulizer solution Take 3 mLs (2.5 mg  total) by nebulization every 6 (six) hours as needed for wheezing or shortness of breath. 75 mL 12  . carvedilol (COREG) 6.25 MG tablet Take 1 tablet (6.25 mg total) by mouth 2 (two) times daily with a meal. 60 tablet 3  . ferrous sulfate 325 (65 FE) MG tablet Take 1 tablet (325 mg total) by mouth daily with breakfast. 30 tablet 0  . furosemide (LASIX) 40 MG tablet Take 1 tablet (40 mg total) by mouth 2 (two) times daily. 60 tablet 3  . gabapentin (NEURONTIN) 800 MG tablet Take 1 tablet (800 mg total) by mouth 3 (three) times daily. 90 tablet 0  . HYDROcodone-acetaminophen (LORTAB) 5-325 MG tablet Take 1 tablet by mouth every 4 (four) hours as needed for moderate pain. 90 tablet 0  . pantoprazole (PROTONIX) 40 MG tablet Take 1 tablet (40 mg total) by mouth daily. 30 tablet 3  . sacubitril-valsartan (ENTRESTO) 24-26 MG Take 1 tablet by mouth 2 (two) times daily. 60 tablet 11  . spironolactone (ALDACTONE) 25 MG tablet Take 0.5 tablets (12.5 mg total) by mouth daily. 30 tablet 0  . Fluticasone-Salmeterol (ADVAIR) 250-50 MCG/DOSE AEPB Inhale 2 puffs into the lungs 2 (two) times daily.     No current facility-administered medications on file prior to visit.     Review of Systems:  As per HPI- otherwise negative.   Physical Examination: Vitals:   06/08/16 1549  BP: (!) 132/92  Pulse: 98  Resp: 17  Temp: 98.2 F (36.8 C)   Vitals:   06/08/16 1549  Weight: 254 lb 12.8 oz (115.6 kg)  Height: 6\' 2"  (1.88 m)   Body mass index is 32.71 kg/m. Ideal Body Weight: Weight in (lb) to have BMI = 25: 194.3  GEN: WDWN, NAD, Non-toxic, A & O x 3, overweight, looks well HEENT: Atraumatic, Normocephalic. Neck supple. No masses, No LAD. Ears and Nose: No external deformity. CV: RRR, No M/G/R. No JVD. No thrill. No extra heart sounds. PULM: CTA B, no wheezes, crackles, rhonchi. No retractions. No resp. distress. No accessory muscle use. ABD: S, NT, ND EXTR: No c/c/e NEURO Normal gait.  PSYCH:  Normally interactive. Conversant. Not depressed or anxious appearing.  Calm demeanor.  Rhus derm rash- 1 confluent patch on the left forearm and a few smaller spots on the right arm  Wt Readings from Last 3 Encounters:  06/08/16 254 lb 12.8 oz (115.6 kg)  04/30/16 245 lb 12.8 oz (111.5 kg)  04/15/16 240 lb (108.9 kg)    Assessment and Plan: Non-Hodgkin's lymphoma, unspecified body region, unspecified non-Hodgkin lymphoma type ([place]) - Plan: DG Chest 2 View  Chronic  pain syndrome - Plan: gabapentin (NEURONTIN) 800 MG tablet, HYDROcodone-acetaminophen (LORTAB) 5-325 MG tablet  Poison ivy - Plan: triamcinolone cream (KENALOG) 0.1 %  Encounter for immunization - Plan: Flu Vaccine QUAD 36+ mos IM  Here today for a few concerns He would like refills of his neurontin and lortab that he takes for chronic pain thought due to chest radiation/ nerve damage from stage 4 non- hodgkins lymphoma dx in 2003.  Refilled these for him today Topical triamcinolone for PI Flu shot today Discussed ways that he might be able to re-gain insurance.  He has not been following up with his cancer surveillance due to cost.  Decided to get a plain CXR today for follow-up since he cannot afford CT or PET right now.  In a similar vein he cannot afford scanning of his neck to look into his suspicion of a pinched nerve.    Meds ordered this encounter  Medications  . gabapentin (NEURONTIN) 800 MG tablet    Sig: Take 1 tablet (800 mg total) by mouth 3 (three) times daily.    Dispense:  90 tablet    Refill:  3  . HYDROcodone-acetaminophen (LORTAB) 5-325 MG tablet    Sig: Take 1 tablet by mouth every 4 (four) hours as needed for moderate pain. May fill in 30 days after rx    Dispense:  90 tablet    Refill:  0  . triamcinolone cream (KENALOG) 0.1 %    Sig: Apply 1 application topically 2 (two) times daily. Use as needed on rash    Dispense:  30 g    Refill:  0     Signed Lamar Blinks, MD

## 2016-06-08 NOTE — Telephone Encounter (Signed)
Called to inform pt that refills were sent to pharmacy.

## 2016-06-08 NOTE — Telephone Encounter (Signed)
Pt has appt w/ Dr. Lorelei Pont today at 3:45 PM.

## 2016-06-08 NOTE — Progress Notes (Signed)
Pre visit review using our clinic review tool, if applicable. No additional management support is needed unless otherwise documented below in the visit note. 

## 2016-07-20 MED FILL — KLOR-CON M20 TABLET: 20 | 30 days supply | Qty: 60 | Fill #1

## 2016-07-20 MED FILL — SPIRONOLACTONE 25 MG TABLET: 25 | 34 days supply | Qty: 17 | Fill #2

## 2016-07-20 MED FILL — DIGITEK 125 MCG TABLET: 125 | 30 days supply | Qty: 30 | Fill #2

## 2016-07-20 MED FILL — CARVEDILOL 3.125 MG TABLET: 3.125 | 34 days supply | Qty: 68 | Fill #1

## 2016-07-20 MED FILL — FUROSEMIDE 40 MG TABLET: 40 | 34 days supply | Qty: 34 | Fill #2

## 2016-07-27 ENCOUNTER — Encounter: Payer: Self-pay | Admitting: Family Medicine

## 2016-07-27 ENCOUNTER — Ambulatory Visit (INDEPENDENT_AMBULATORY_CARE_PROVIDER_SITE_OTHER): Payer: Medicaid Other | Admitting: Family Medicine

## 2016-07-27 ENCOUNTER — Telehealth: Payer: Self-pay | Admitting: Family Medicine

## 2016-07-27 VITALS — BP 170/125 | HR 108 | Temp 98.0°F | Ht 74.0 in | Wt 256.0 lb

## 2016-07-27 DIAGNOSIS — R000 Tachycardia, unspecified: Secondary | ICD-10-CM

## 2016-07-27 DIAGNOSIS — E034 Atrophy of thyroid (acquired): Secondary | ICD-10-CM | POA: Diagnosis not present

## 2016-07-27 DIAGNOSIS — Z5181 Encounter for therapeutic drug level monitoring: Secondary | ICD-10-CM

## 2016-07-27 DIAGNOSIS — G894 Chronic pain syndrome: Secondary | ICD-10-CM

## 2016-07-27 DIAGNOSIS — C8580 Other specified types of non-Hodgkin lymphoma, unspecified site: Secondary | ICD-10-CM

## 2016-07-27 DIAGNOSIS — K219 Gastro-esophageal reflux disease without esophagitis: Secondary | ICD-10-CM

## 2016-07-27 DIAGNOSIS — I5022 Chronic systolic (congestive) heart failure: Secondary | ICD-10-CM

## 2016-07-27 MED ORDER — HYDROCODONE-ACETAMINOPHEN 5-325 MG PO TABS: 1.0000 | ORAL_TABLET | Freq: Four times a day (QID) | ORAL | 0 refills | Status: DC | PRN

## 2016-07-27 MED ORDER — FUROSEMIDE 40 MG PO TABS
40.0000 mg | ORAL_TABLET | Freq: Two times a day (BID) | ORAL | 3 refills | Status: DC
Start: 2016-07-27 — End: 2016-09-14

## 2016-07-27 MED ORDER — HYDROCODONE-ACETAMINOPHEN 5-325 MG PO TABS
1.0000 | ORAL_TABLET | Freq: Four times a day (QID) | ORAL | 0 refills | Status: DC | PRN
Start: 2016-07-27 — End: 2016-07-27

## 2016-07-27 MED ORDER — PANTOPRAZOLE SODIUM 40 MG PO TBEC: 40.0000 mg | DELAYED_RELEASE_TABLET | Freq: Every day | ORAL | 6 refills | Status: DC

## 2016-07-27 MED ORDER — GABAPENTIN 800 MG PO TABS: 800.0000 mg | ORAL_TABLET | Freq: Three times a day (TID) | ORAL | 3 refills | Status: DC

## 2016-07-27 NOTE — Telephone Encounter (Signed)
Patient called stating that when he went to pick up his medications from the pharmacy they told him medicaid needed verification that the medication was necessary. Patient would like to know how to handle this situation so he can get his medication. Please advise.    Patient phone: [phone]

## 2016-07-27 NOTE — Progress Notes (Signed)
Pre visit review using our clinic review tool, if applicable. No additional management support is needed unless otherwise documented below in the visit note. 

## 2016-07-27 NOTE — Telephone Encounter (Signed)
Medication will required PA. Called pt back and advised him to call the pharmacy to find out how much it would cost for about a week's worth of HYDROcodone-acetaminophen will cost until the PA request can be completed. Informed the pt that I would start the PA today and will get back with him as soon as a determination has been made.

## 2016-07-27 NOTE — Patient Instructions (Signed)
It was good to see you today- I am glad that you have cardiology follow-up tomorrow!  Please increase your lasix (furosemide) to twice a day I am going to reach out to your cardiologist for further recommendations in the meantime Please start weighing yourself EVERY morning right after you urinate and keep a log of your readings.   We are going to get some labs for you today as well to follow-up on your thyroid and your heart failure.

## 2016-07-27 NOTE — Progress Notes (Addendum)
[place] at [facility] [address], [place], [place] [address] [id number] [phone]  Date:  07/27/2016   Name:  Leonard [name]   DOB:  May 02, 1976   MRN:  [id number]  PCP:  Lamar Blinks, MD    Chief Complaint: Follow-up (Pt here for f/u visit. Pt feels that )   History of Present Illness:  Leonard [name] is a 40 y.o. very pleasant male patient who presents with the following:  Last seen by myself about 7 weeks ago to follow-up on his chronic pain and other chronic illnesses- HPI from that visit He has a history of non- hodgkin's lymphoma, and then was hospitalized with new CHF in August. EF 25- 30% . He has been doing some odd jobs since he lost his other job. 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   He notes that sometimes when he is bending over and lifting things a lot he might be about to pass out.  He will sometimes have some neck pain.  He feels like he might have a pinched nerve in his left neck and wonders if this is why he feels like he has pre-syncope.  Would like to have imaging to further evaluate this issue but cost is a big concern  We are treating his chronic pain; here today to discuss meds and get refills He lost his insurance as he was laid off.  He has not really been following up with oncology- noted a CT chest in his chart from February-   IMPRESSION: Collapse and consolidation of the left upper lung. This is likely to represent pneumonia but occult obstructing lesion is not excluded. Recommend followup after resolution of acute process. No mediastinal Lymphadenopathy.  So far he has not had follow-up CT chest He is using the wellness center off of [address] for most of his health care now as it is more cost- effective.   He is trying to get an orange card.  He has thought about disability but hopes he will be able to get back to work instead  We obtained a CXR for  follow-up at last visit-   CXR October CHEST  2 VIEW  COMPARISON:  04/05/2016  FINDINGS: Left apical pleural parenchymal density is unchanged. Negative for mass or adenopathy. Right lung is clear. Negative for heart failure or effusion. Negative for pneumonia.  IMPRESSION: Chronic left upper lobe scarring is stable.  No acute abnormality.  He is being treated with lortab 90 a month, and Neurontin for his chronic pain  Per [facility]an his last fill of hydrocodone was 10/28  He notes that "my breathing is not getting any better."  He is seeing cardiology (CHF clinic) tomorrow for follow-up.  He has been remiss on following up due to financial concerns but he now has medicaid which is great news!  He notes that his weight is going up despite not eating much.   He feels like he is swollen and bloated. He is NOT weighing himself at home on a regular basis  He is taking lasix 40 mg just ONCE a day at this time- I had thought he was supposed to be taking this BID.  He does not notice any orthopnea. He feels SOB and easily tired with any exertion.  He can also have "a tight and sore feeling in my chest" with exertion.  He does not have CP at rest.  He wonders  if his sx are due to asthma and if he needs a neb machine. However he does not have any wheezing.  Advised that his SOB is more likely to be cardiac in origin  He is taking his hydrocodone 3x a day for chronic pain due to history of radiation- admits that he will often take a BC powder at night for pain.  He wonders if we can incresae his supply to 120 a month so he can take this 4x a day and stop BC powders  His youngest child, Wells Guiles, was born 6 weeks ago. She is doing well  Pulse Readings from Last 3 Encounters:  07/27/16 (!) 108  06/08/16 98  04/30/16 (!) 109   BP Readings from Last 3 Encounters:  07/27/16 (!) 181/121  06/08/16 (!) 132/92  04/30/16 (!) 148/96   Wt Readings from Last 3 Encounters:  07/27/16 256 lb (116.1 kg)   06/08/16 254 lb 12.8 oz (115.6 kg)  04/30/16 245 lb 12.8 oz (111.5 kg)   Lab Results  Component Value Date   TSH 6.211 (H) 04/06/2016    Patient Active Problem List   Diagnosis Date Noted  . History of non-Hodgkin's lymphoma 04/30/2016  . Snoring 04/30/2016  . Shortness of breath   . CHF (congestive heart failure) ([place]) 04/05/2016  . Lymphoma ([place]) 12/11/2015  . Abnormal chest x-ray 10/09/2015  . Pneumonia 10/09/2015  . Essential hypertension 10/09/2015    Past Medical History:  Diagnosis Date  . Asthma   . Blood transfusion without reported diagnosis   . CHF (congestive heart failure) ([place]) 04/05/2016  . COPD (chronic obstructive pulmonary disease) ([place])   . Hypertension   . Lymphoma ([place])    x 2  . Sleep apnea   . Thyroid disease     Past Surgical History:  Procedure Laterality Date  . CARDIAC CATHETERIZATION N/A 04/15/2016   Procedure: Right/Left Heart Cath and Coronary Angiography;  Surgeon: Larey Dresser, MD;  Location: [facility];  Service: Cardiovascular;  Laterality: N/A;  . tumor biopsy    . WISDOM TOOTH EXTRACTION      Social History  Substance Use Topics  . Smoking status: Former Smoker    Packs/day: 1.25    Years: 10.00    Types: Cigarettes    Quit date: 04/08/2007  . Smokeless tobacco: Current User    Types: Snuff  . Alcohol use No    Family History  Problem Relation Age of Onset  . Cancer Mother   . Emphysema Mother   . Bronchitis Mother     No Known Allergies  Medication list has been reviewed and updated.  Current Outpatient Prescriptions on File Prior to Visit  Medication Sig Dispense Refill  . acetaminophen (TYLENOL) 500 MG tablet Take 1 tablet (500 mg total) by mouth every 6 (six) hours as needed. (Patient taking differently: Take 1,000 mg by mouth every 6 (six) hours as needed (pain). ) 30 tablet 0  . albuterol (PROVENTIL HFA;VENTOLIN HFA) 108 (90 Base) MCG/ACT inhaler Inhale 2 puffs into the lungs every 6 (six) hours as  needed for wheezing or shortness of breath. 3 Inhaler 3  . albuterol (PROVENTIL) (2.5 MG/3ML) 0.083% nebulizer solution Take 3 mLs (2.5 mg total) by nebulization every 6 (six) hours as needed for wheezing or shortness of breath. 75 mL 12  . carvedilol (COREG) 6.25 MG tablet Take 1 tablet (6.25 mg total) by mouth 2 (two) times daily with a meal. 60 tablet 3  . ferrous sulfate  325 (65 FE) MG tablet Take 1 tablet (325 mg total) by mouth daily with breakfast. 30 tablet 0  . Fluticasone-Salmeterol (ADVAIR) 250-50 MCG/DOSE AEPB Inhale 2 puffs into the lungs 2 (two) times daily.    . furosemide (LASIX) 40 MG tablet Take 1 tablet (40 mg total) by mouth 2 (two) times daily. 60 tablet 3  . gabapentin (NEURONTIN) 800 MG tablet Take 1 tablet (800 mg total) by mouth 3 (three) times daily. 90 tablet 3  . HYDROcodone-acetaminophen (LORTAB) 5-325 MG tablet Take 1 tablet by mouth every 4 (four) hours as needed for moderate pain. May fill in 30 days after rx 90 tablet 0  . levothyroxine (SYNTHROID, LEVOTHROID) 50 MCG tablet Take 1 tablet (50 mcg total) by mouth daily before breakfast. 30 tablet 2  . montelukast (SINGULAIR) 10 MG tablet Take 1 tablet (10 mg total) by mouth at bedtime. 30 tablet 2  . pantoprazole (PROTONIX) 40 MG tablet Take 1 tablet (40 mg total) by mouth daily. 30 tablet 3  . sacubitril-valsartan (ENTRESTO) 24-26 MG Take 1 tablet by mouth 2 (two) times daily. 60 tablet 11  . spironolactone (ALDACTONE) 25 MG tablet Take 0.5 tablets (12.5 mg total) by mouth daily. 30 tablet 0  . triamcinolone cream (KENALOG) 0.1 % Apply 1 application topically 2 (two) times daily. Use as needed on rash 30 g 0   No current facility-administered medications on file prior to visit.     Review of Systems:  As per HPI- otherwise negative.   Physical Examination: Vitals:   07/27/16 0935 07/27/16 0939  BP: (!) 175/123 (!) 181/121  Pulse: (!) 108   Temp: 98 F (36.7 C)    Blood pressure (!) 181/121, pulse (!)  108, temperature 98 F (36.7 C), height 6\' 2"  (1.88 m), weight 256 lb (116.1 kg), SpO2 100 %.  Ideal Body Weight:    GEN: WDWN, NAD, Non-toxic, A & O x 3, obese, large build.  Here today with his wife [name] and infant daughter HEENT: Atraumatic, Normocephalic. Neck supple. No masses, No LAD.  Bilateral TM wnl, oropharynx normal.  PEERL,EOMI.   Ears and Nose: No external deformity. CV: RRR- tachycardia, No M/G/R. No JVD. No thrill. No extra heart sounds. PULM: CTA B, no wheezes, crackles, rhonchi. No retractions. No resp. distress. No accessory muscle use. NEURO Normal gait.  PSYCH: Normally interactive. Conversant. Not depressed or anxious appearing.  Calm demeanor.   EKG: sinus tachycardia with significant LVH/ strain. Change from prior EKG Assessment and Plan: Chronic systolic congestive heart failure (HCC) - Plan: B Nat Peptide  Chronic pain syndrome - Plan: gabapentin (NEURONTIN) 800 MG tablet, HYDROcodone-acetaminophen (LORTAB) 5-325 MG tablet, DISCONTINUED: HYDROcodone-acetaminophen (LORTAB) 5-325 MG tablet  Hypothyroidism due to acquired atrophy of thyroid - Plan: TSH, CANCELED: TSH  Tachycardia - Plan: EKG 12-Lead  Medication monitoring encounter - Plan: CBC, Comprehensive metabolic panel  Gastroesophageal reflux disease without esophagitis - Plan: pantoprazole (PROTONIX) 40 MG tablet  Other specified type of non-Hodgkin lymphoma, unspecified body region [facility])  Here today for a follow-up visit.  He has a complex PHmx for someone of his age including lymphoma s/p radiation treatment, resultant chronic pain, and severe CHF noted this past summer He has not followed up with cardiology in the last 3 months His HTN is more pronounced today and he has severe LVH on EKG.  Will increase his lasix to BID, message to cardiology for any other recommendations prior to his visit tomorrow Labs pending as above  Refilled his  protonix Increase his pain med rx to 120 or 4x/ day; advised to  stop NSAIDs and BC powders    Signed Lamar Blinks, MD  addnd on 12/6- pt was a no show to cardiology, and did not to go lab for his BW on Monday.  I do not feel that I am able to manage this patient's cardiovascular disease without the help of cardiology, and he has no no-showed 2 appts with the CHF clinic. Will discuss this patient with clinic management

## 2016-07-28 ENCOUNTER — Encounter (HOSPITAL_COMMUNITY): Payer: Self-pay

## 2016-07-29 ENCOUNTER — Encounter: Payer: Self-pay | Admitting: Family Medicine

## 2016-08-12 ENCOUNTER — Other Ambulatory Visit: Payer: Self-pay | Admitting: Family Medicine

## 2016-08-13 ENCOUNTER — Telehealth (HOSPITAL_COMMUNITY): Payer: Self-pay | Admitting: *Deleted

## 2016-08-13 ENCOUNTER — Telehealth (HOSPITAL_COMMUNITY): Payer: Self-pay | Admitting: Vascular Surgery

## 2016-08-13 MED FILL — PANTOPRAZOLE SOD DR 40 MG T: 40 | 30 days supply | Qty: 30 | Fill #0

## 2016-08-13 NOTE — Telephone Encounter (Signed)
Called pt to discuss- "this call cannot be completed because there are restrictions on this line.'  Will have to wait and see if he calls [place]

## 2016-08-13 NOTE — Telephone Encounter (Signed)
-----   Message from Emi Holes, [place] sent at 07/28/2016  9:23 AM EST ----- Regarding: GABAPENTIN 800MG  PRIOR AUTHORIZATION Received refill authorization request for GABAPENTIN 800MG  TAB. Spoke with pharmacy and was informed that this medication will require a prior authorization.  GABAPENTIN 400MG  would not require prior authorization. Pharmacy states that if you would like the pt could take TWO 400MG  CAPSULES (Total 800mg ) THREE TIMES DAILY instead of the one 800MG  tablet three times daily. Please advise.

## 2016-08-13 NOTE — Telephone Encounter (Signed)
Pt does not have VM will send pt letter to make f/u appt

## 2016-08-13 NOTE — Telephone Encounter (Signed)
Pharmacy called to let us know patient picked up Protonix today prescribed by his PCP, said this could interact with Digoxin. Discussed with Doroteo Bradford PharmD no changes to medications needed.  Also pharmacy said he would be needed refills soon on his heart meds and that he missed his last appointment with [place] and needed a follow up appointment.  Myrtie Soman a staff message asking if she could get him scheduled.

## 2016-08-25 ENCOUNTER — Encounter: Payer: Self-pay | Admitting: Family Medicine

## 2016-08-25 ENCOUNTER — Other Ambulatory Visit: Payer: Self-pay | Admitting: Family Medicine

## 2016-08-25 DIAGNOSIS — G894 Chronic pain syndrome: Secondary | ICD-10-CM

## 2016-08-26 ENCOUNTER — Encounter: Payer: Self-pay | Admitting: Family Medicine

## 2016-08-26 MED ORDER — HYDROCODONE-ACETAMINOPHEN 5-325 MG PO TABS: 1.0000 | ORAL_TABLET | Freq: Four times a day (QID) | ORAL | 0 refills | Status: DC | PRN

## 2016-09-02 ENCOUNTER — Telehealth: Payer: Self-pay | Admitting: Family Medicine

## 2016-09-02 NOTE — Telephone Encounter (Signed)
Received UDS results dated 08/27/2016- negative for prescribed hydrocodone but positive for cocaine  Will discharge pt from [place] prepared today.

## 2016-09-03 ENCOUNTER — Ambulatory Visit (HOSPITAL_COMMUNITY)
Admission: RE | Admit: 2016-09-03 | Discharge: 2016-09-03 | Disposition: A | Discharge status: Home / Self Care | Payer: Medicaid Other | Source: Ambulatory Visit | Attending: Cardiology | Admitting: Cardiology

## 2016-09-03 ENCOUNTER — Encounter (HOSPITAL_COMMUNITY): Payer: Self-pay

## 2016-09-03 ENCOUNTER — Other Ambulatory Visit (HOSPITAL_COMMUNITY): Payer: Self-pay | Admitting: Adult Health

## 2016-09-03 VITALS — BP 162/110 | HR 100 | Wt 242.0 lb

## 2016-09-03 DIAGNOSIS — Z87891 Personal history of nicotine dependence: Secondary | ICD-10-CM | POA: Diagnosis not present

## 2016-09-03 DIAGNOSIS — E079 Disorder of thyroid, unspecified: Secondary | ICD-10-CM | POA: Insufficient documentation

## 2016-09-03 DIAGNOSIS — Z8572 Personal history of non-Hodgkin lymphomas: Secondary | ICD-10-CM | POA: Diagnosis not present

## 2016-09-03 DIAGNOSIS — Z836 Family history of other diseases of the respiratory system: Secondary | ICD-10-CM | POA: Diagnosis not present

## 2016-09-03 DIAGNOSIS — I10 Essential (primary) hypertension: Secondary | ICD-10-CM | POA: Diagnosis not present

## 2016-09-03 DIAGNOSIS — R0683 Snoring: Secondary | ICD-10-CM

## 2016-09-03 DIAGNOSIS — I5042 Chronic combined systolic (congestive) and diastolic (congestive) heart failure: Secondary | ICD-10-CM | POA: Insufficient documentation

## 2016-09-03 DIAGNOSIS — I2510 Atherosclerotic heart disease of native coronary artery without angina pectoris: Secondary | ICD-10-CM | POA: Insufficient documentation

## 2016-09-03 DIAGNOSIS — I5022 Chronic systolic (congestive) heart failure: Secondary | ICD-10-CM

## 2016-09-03 DIAGNOSIS — Z923 Personal history of irradiation: Secondary | ICD-10-CM | POA: Diagnosis not present

## 2016-09-03 DIAGNOSIS — Z9221 Personal history of antineoplastic chemotherapy: Secondary | ICD-10-CM | POA: Insufficient documentation

## 2016-09-03 DIAGNOSIS — K219 Gastro-esophageal reflux disease without esophagitis: Secondary | ICD-10-CM | POA: Diagnosis not present

## 2016-09-03 DIAGNOSIS — Z801 Family history of malignant neoplasm of trachea, bronchus and lung: Secondary | ICD-10-CM | POA: Insufficient documentation

## 2016-09-03 DIAGNOSIS — I160 Hypertensive urgency: Secondary | ICD-10-CM

## 2016-09-03 DIAGNOSIS — J449 Chronic obstructive pulmonary disease, unspecified: Secondary | ICD-10-CM | POA: Insufficient documentation

## 2016-09-03 DIAGNOSIS — G4730 Sleep apnea, unspecified: Secondary | ICD-10-CM | POA: Diagnosis not present

## 2016-09-03 DIAGNOSIS — I110 Hypertensive heart disease with heart failure: Secondary | ICD-10-CM | POA: Insufficient documentation

## 2016-09-03 LAB — BASIC METABOLIC PANEL
ANION GAP: 7 (ref 5–15)
BUN: 9 mg/dL (ref 6–20)
CALCIUM: 9.2 mg/dL (ref 8.9–10.3)
CO2: 27 mmol/L (ref 22–32)
CREATININE: 0.95 mg/dL (ref 0.61–1.24)
Chloride: 103 mmol/L (ref 101–111)
GFR calc Af Amer: >60 mL/min (ref >60)
GLUCOSE: 105 mg/dL — AB (ref 65–99)
Potassium: 3.7 mmol/L (ref 3.5–5.1)
Sodium: 137 mmol/L (ref 135–145)

## 2016-09-03 LAB — BRAIN NATRIURETIC PEPTIDE: B Natriuretic Peptide: 323.2 pg/mL — ABNORMAL HIGH (ref 0.0–100.0)

## 2016-09-03 MED ORDER — CARVEDILOL 6.25 MG PO TABS: 6.2500 mg | ORAL_TABLET | Freq: Two times a day (BID) | ORAL | 3 refills | Status: DC

## 2016-09-03 MED ORDER — SACUBITRIL-VALSARTAN 24-26 MG PO TABS: 1.0000 | ORAL_TABLET | Freq: Two times a day (BID) | ORAL | 3 refills | Status: DC

## 2016-09-03 MED FILL — CARVEDILOL 6.25 MG TABLET: 6.25 | 30 days supply | Qty: 60 | Fill #0

## 2016-09-03 MED FILL — SPIRONOLACTONE 25 MG TABLET: 25 | 34 days supply | Qty: 17 | Fill #0

## 2016-09-03 NOTE — Progress Notes (Signed)
CSW referred to assist with community resources and PCP. Patient is currently living with his girlfriend who is mother to two of his 8 children. He reports the other children and mothers reside in [place] area.  Patient reports history of non hodgkin's lymphoma twice, substance abuse which lead to 9.5 years of imprisonment. Patient was released less than a year ago and reports he recently used due to multiple social issues starting with his car breaking down. Patient states he lost his job and with increasing financial issues had a fight with his girlfriend and he left home. Patient reports he returned home last week and is hopeful to "get back on track". Patient states he is currently attending NA meetings 4-5x weekly with his girlfriend who is encouraging his attendance. Patient is trying to be compliant with heart failure regimen and CSW suggested paramedicine program to assist with getting started. Patient is open to program and will be referred. Patient also in need of a PCP as he was dismissed from previous practice due to positive drug screen. CSW will attempt to refer patient to IM clinic for primary care needs. Patient appears interested in getting his health on a positive trail and open for suggestions. Patient also states  Intention to continue with weekly NA attendance. CSW will follow up with PCP and provide care coordination with paramedicine program. Raquel Sarna, LCSW [phone]

## 2016-09-03 NOTE — Progress Notes (Signed)
Advanced Heart Failure Clinic Note   Primary Care: None per patient Primary Cardiologist: Dr. Aundra Dubin   HPI:  Leonard [name] is a 41 y.o. male  with history of non-Hodgkin's lymphoma s/p Chemoradiation at [facility] - currently in remission, HTN, Asthma, GERD, and chronic pain.    He presented to [facility]05[facility]16 with worsening SOB and peripheral edema x 2 months, worse over the past two weeks. Also c/o of abdominal distention and orthopnea.  Pertinent labs on admission include K  4.1, Creatinine 1.05, BNP 722.6, Negative troponin. CXR showed vascular congestion and bibasilar airspace opacities.   Echo 04/07/16 LVEF 25-30%, Grade 2 DD, Mild AR, Mild MR, PA peak pressure 47 mm Hg  He presents today for follow up.  He was scheduled for 2 week follow up in September, but has not been seen since then.  Has been following with PCP, but was fired from PCP office yesterday with + cocaine and negative prescribed meds on UDS. Weight down 3 lbs since last visit in September. Had some family issues and was away from home for quite a while.  BP very high in clinic today. Last time he did cocaine was prior to new years.  Denies lightheadedness, dizziness, numbness, tingling, HAs. Denies Blurred vision. Coughing up brown mucous for over a week now. States his whole house was sick.  Not weighing daily. Still having his atypical chest pain. No SOB on flat ground. + DOE with hills, stairs, or pushing/pully heavy objects.  Not working.  Has been out a couple of months.  Does have medicaid now.    today for post hospital/post cath follow up. Down 6 lbs from discharge Overall feeling better, as far as breathing, but has been uncomfortable as he is been out of some of his medications, including pain medications for 1-2 weeks. Feels like he has more energy.  Hasn't been checking BP at home. Denies lightheadedness or dizziness. Has chronic, atypical chest pain. Weighing daily, weight at home ~ 240-245. Watched salt and fluid,  sometimes difficult with his job. 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   Social History: Currently in between jobs  Lives in [place] with Wife, Mother in [place], Daughter, and step-Daughter.  Has another baby on the way.  (Will be his 9th kid overall). Recently started job in a warehouse.  Will get benefits in 90 days from start.    Family History: No significant cardiovascular history.  Mom passed from Lung CA.   Past Medical History  [facility] 04/15/16  Dominance: Right  Left Main  Short, no significant CAD.  Left Anterior Descending  20-30% proximal LAD stenosis prior to small D1.  Left Circumflex  No significant CAD.  Right Coronary Artery  No significant CAD.   Hemodynamics (mmHg) RA mean 13 RV 52/17 PA 55/23, mean 38 PCWP mean 26 Oxygen saturations: PA 55% AO 98% Cardiac Output (Fick) 4.55  Cardiac Index (Fick) 1.98 PVR 2.6 WU  Past Medical History:  Diagnosis Date  . Asthma   . Blood transfusion without reported diagnosis   . CHF (congestive heart failure) ([place]) 04/05/2016  . COPD (chronic obstructive pulmonary disease) ([place])   . Hypertension   . Lymphoma ([place])    x 2  . Sleep apnea   . Thyroid disease    Current Outpatient Prescriptions on File Prior to Encounter  Medication Sig Dispense Refill  . acetaminophen (TYLENOL) 500 MG tablet Take 1 tablet (500 mg total) by mouth every 6 (six)  hours as needed. (Patient taking differently: Take 1,000 mg by mouth every 6 (six) hours as needed (pain). ) 30 tablet 0  . albuterol (PROVENTIL HFA;VENTOLIN HFA) 108 (90 Base) MCG/ACT inhaler Inhale 2 puffs into the lungs every 6 (six) hours as needed for wheezing or shortness of breath. 3 Inhaler 3  . albuterol (PROVENTIL) (2.5 MG/3ML) 0.083% nebulizer solution Take 3 mLs (2.5 mg total) by nebulization every 6 (six) hours as needed for wheezing or shortness of breath. 75 mL 12  . ferrous sulfate 325 (65 FE) MG tablet Take 1 tablet (325 mg total) by mouth  daily with breakfast. 30 tablet 0  . Fluticasone-Salmeterol (ADVAIR) 250-50 MCG/DOSE AEPB Inhale 2 puffs into the lungs 2 (two) times daily.    . furosemide (LASIX) 40 MG tablet Take 1 tablet (40 mg total) by mouth 2 (two) times daily. 60 tablet 3  . gabapentin (NEURONTIN) 800 MG tablet Take 1 tablet (800 mg total) by mouth 3 (three) times daily. 90 tablet 3  . HYDROcodone-acetaminophen (LORTAB) 5-325 MG tablet Take 1 tablet by mouth every 6 (six) hours as needed for moderate pain. May fill on 09/06/2016- NOT before 120 tablet 0  . levothyroxine (SYNTHROID, LEVOTHROID) 50 MCG tablet Take 1 tablet (50 mcg total) by mouth daily before breakfast. 30 tablet 2  . montelukast (SINGULAIR) 10 MG tablet Take 1 tablet (10 mg total) by mouth at bedtime. 30 tablet 2  . pantoprazole (PROTONIX) 40 MG tablet Take 1 tablet (40 mg total) by mouth daily. 30 tablet 6  . spironolactone (ALDACTONE) 25 MG tablet Take 0.5 tablets (12.5 mg total) by mouth daily. 30 tablet 0  . triamcinolone cream (KENALOG) 0.1 % Apply 1 application topically 2 (two) times daily. Use as needed on rash 30 g 0   No current facility-administered medications on file prior to encounter.    No Known Allergies  Social History   Social History  . Marital status: Single    Spouse name: N/A  . Number of children: N/A  . Years of education: N/A   Occupational History  . Not on file.   Social History Main Topics  . Smoking status: Former Smoker    Packs/day: 1.25    Years: 10.00    Types: Cigarettes    Quit date: 04/08/2007  . Smokeless tobacco: Current User    Types: Snuff  . Alcohol use No  . Drug use: No  . Sexual activity: Yes   Other Topics Concern  . Not on file   Social History Narrative  . No narrative on file   Family History  Problem Relation Age of Onset  . Cancer Mother   . Emphysema Mother   . Bronchitis Mother     Vitals:   09/03/16 0925 09/03/16 0947  BP: (!) 194/120 (!) 162/110  Pulse: (!) 109 100   SpO2: 98%   Weight: 242 lb (109.8 kg)    Wt Readings from Last 3 Encounters:  09/03/16 242 lb (109.8 kg)  07/27/16 256 lb (116.1 kg)  06/08/16 254 lb 12.8 oz (115.6 kg)    PHYSICAL EXAM: General: Fatigued appearing.  HEENT: Normal -Neck: supple. JVP ~7-8 cm. Carotids 2+ bilat; no bruits. No thyromegaly or nodule noted.  Cor: PMI nondisplaced. Regular, slightly tachy.  Lungs: Clear, normal effrt Abdomen: Obese, soft, NT, ND, no HSM. No bruits or masses. +BS  Extremities: no cyanosis, clubbing, rash. Trace ankle edema.  Neuro: alert & orientedx3, cranial nerves grossly intact. moves all 4 extremities  w/o difficulty. Affect  Flat  ASSESSMENT & PLAN:  1. Chronic combined CHF - Diagnosed 03/2016- Echo 04/07/16 LVEF 25-30%, Grade 2 DD, Peak PA pressure 47 mm Hg.  - RHC 04/15/16 with marginal cardiac output and RA 13.  - Volume status looks relatively stable. Continue Lasix 40 mg po BID with KCL 40 meq. BMET/BNP - Resume coreg 6.25 mg BID.  - Resume Entresto 24/26 mg BID.   - Negative Hepatitis and HIV panels. Negative ANA and RF.  Normal UPEP, No M spike on SPEP.  2. HTN Urgency - Markedly increased during his visit today but improved with rest. He has not been taking his medicine correctly. (had been only taking medications once a day, and not sure he had been taking them at all) - Resuming meds as above.  - Reviewed symptoms to be aware of for patient. Lightheadedness, dizziness, severe head ache, blurred vision, slurred speech, or unilateral weakness or numbness . He knows to seek emergency care if he experiences these.  3. H/o Non-Hodgkins Lymphoma - h/o of rx with adriamycin. Likely contributing factor to his cardiomyopathy. 4. Snoring - Snores badly with + apneic episodes. Had an inconclusive sleep study in the past.  - Needs repeat sleep study once   HFSW to see today to help with PCP.   HTN markedly elevated on arrival but improved with rest in clinic. Medications as  above.  Will keep very close follow up for med adjustment.  Have discussed with Pharm-D.   Labs and meds today. Follow up 1 week for pharm-D and 2 weeks with PA.   Shirley Friar, PA-C 09/03/16   Total time spent > 25 minutes. Over half that spent discussing the above.

## 2016-09-03 NOTE — Patient Instructions (Signed)
Take Carvedilol (Coreg) 6.25 mg tablet TWICE daily.  Take Entresto 24/26 mg tablet TWICE daily.  Routine lab work today. Will notify you of abnormal results, otherwise no news is good news!  Follow up next week with heart failure pharmacist Ileene Patrick.  Follow up 2 weeks with Oda Kilts PA-C.  If you have severe headache unrelieved by tylenol, blurred vision, slurred speech, and/or one-sided vision to seek emergency medical care immediately.  Do the following things EVERYDAY: 1) Weigh yourself in the morning before breakfast. Write it down and keep it in a log. 2) Take your medicines as prescribed 3) Eat low salt foods-Limit salt (sodium) to 2000 mg per day.  4) Stay as active as you can everyday 5) Limit all fluids for the day to less than 2 liters

## 2016-09-04 ENCOUNTER — Other Ambulatory Visit (HOSPITAL_COMMUNITY): Payer: Self-pay | Admitting: *Deleted

## 2016-09-07 ENCOUNTER — Telehealth: Payer: Self-pay | Admitting: Family Medicine

## 2016-09-07 NOTE — Telephone Encounter (Signed)
Patient dismissed from [facility] by Lamar Blinks MD, effective September 02, 2016. Dismissal letter sent out by certified / registered mail.  DAJ

## 2016-09-08 ENCOUNTER — Telehealth (HOSPITAL_COMMUNITY): Payer: Self-pay | Admitting: *Deleted

## 2016-09-08 NOTE — Telephone Encounter (Signed)
Melissa called in saying patient is out of [place]   I called [facility] and they have Spironolactone ready to be picked up.  He is enrolled with [facility] for his Entresto, I instructed them to call [facility] and tell them he needs a refill.  I'm leaving him samples of his entresto to last him until he receives his from [facility].  No further questions at this time.   Medication Samples have been provided to the patient.  Drug name: Delene Loll     Strength: 24-26mg         Qty: 28  LOT: [id number]  Exp.Date: 10/18  Dosing instructions: Take 1 Tablet by mouth two times daily.  The patient has been instructed regarding the correct time, dose, and frequency of taking this medication, including desired effects and most common side effects.   Kennieth Rad 10:03 AM 09/08/2016

## 2016-09-11 ENCOUNTER — Telehealth: Payer: Self-pay | Admitting: Licensed Clinical Social Worker

## 2016-09-11 NOTE — Telephone Encounter (Signed)
CSW followed up with patient to inquire about PCP listed on medicaid card as he did not have the card handy during last clinic visit. Patient states that his wife has the card and she is unavailable at the time of call. CSW instructed patient to bring to clinic on Monday for review and further assistance in obtaining a PCP. Patient agreeable and will follow up with CSW at that time. Raquel Sarna, [place], [place]

## 2016-09-14 ENCOUNTER — Encounter (HOSPITAL_COMMUNITY): Payer: Self-pay

## 2016-09-14 ENCOUNTER — Ambulatory Visit (HOSPITAL_COMMUNITY)
Admission: RE | Admit: 2016-09-14 | Discharge: 2016-09-14 | Disposition: A | Discharge status: Home / Self Care | Payer: Medicaid Other | Source: Ambulatory Visit | Attending: Cardiology | Admitting: Cardiology

## 2016-09-14 VITALS — BP 154/100 | HR 120 | Wt 250.4 lb

## 2016-09-14 DIAGNOSIS — Z79899 Other long term (current) drug therapy: Secondary | ICD-10-CM | POA: Insufficient documentation

## 2016-09-14 DIAGNOSIS — Z8572 Personal history of non-Hodgkin lymphomas: Secondary | ICD-10-CM | POA: Diagnosis not present

## 2016-09-14 DIAGNOSIS — I160 Hypertensive urgency: Secondary | ICD-10-CM | POA: Insufficient documentation

## 2016-09-14 DIAGNOSIS — K219 Gastro-esophageal reflux disease without esophagitis: Secondary | ICD-10-CM | POA: Insufficient documentation

## 2016-09-14 DIAGNOSIS — Z923 Personal history of irradiation: Secondary | ICD-10-CM | POA: Insufficient documentation

## 2016-09-14 DIAGNOSIS — Z9221 Personal history of antineoplastic chemotherapy: Secondary | ICD-10-CM | POA: Insufficient documentation

## 2016-09-14 DIAGNOSIS — I5022 Chronic systolic (congestive) heart failure: Secondary | ICD-10-CM

## 2016-09-14 DIAGNOSIS — R0683 Snoring: Secondary | ICD-10-CM | POA: Insufficient documentation

## 2016-09-14 LAB — BASIC METABOLIC PANEL
ANION GAP: 10 (ref 5–15)
BUN: 9 mg/dL (ref 6–20)
CO2: 26 mmol/L (ref 22–32)
Calcium: 8.9 mg/dL (ref 8.9–10.3)
Chloride: 101 mmol/L (ref 101–111)
Creatinine, Ser: 0.87 mg/dL (ref 0.61–1.24)
GFR calc Af Amer: >60 mL/min (ref >60)
Glucose, Bld: 116 mg/dL — ABNORMAL HIGH (ref 65–99)
POTASSIUM: 3.9 mmol/L (ref 3.5–5.1)
Sodium: 137 mmol/L (ref 135–145)

## 2016-09-14 MED ORDER — SACUBITRIL-VALSARTAN 49-51 MG PO TABS: 1.0000 | ORAL_TABLET | Freq: Two times a day (BID) | ORAL | 5 refills | Status: DC

## 2016-09-14 MED ORDER — FUROSEMIDE 40 MG PO TABS: 40.0000 mg | ORAL_TABLET | Freq: Two times a day (BID) | ORAL | 5 refills | Status: DC

## 2016-09-14 NOTE — Progress Notes (Signed)
HF MD: [facility]  HPI:  Leonard [name] is a 41 y.o. Caucasian male  with history of non-Hodgkin's lymphoma s/p Chemoradiation at [facility] - currently in remission, HTN, Asthma, GERD, and chronic pain.   He presented to [facility] 04/05/16 with worsening SOB and peripheral edema x 2 months, worse over the past two weeks. Also c/o of abdominal distention and orthopnea. CXR showed vascular congestion and bibasilar airspace opacities. Echo 04/07/16 LVEF 25-30%, Grade 2 DD, Mild AR, Mild MR, PA peak pressure 47 mm Hg.  Today he presents for pharmacist led heart failure medication titration. He had a follow up appointment with Jonni Sanger on 09/03/16 with last appointment before that time in September 2018. His PCP fired him after + cocaine and negative prescribed meds on UDS. BP at that time was very high, he reports last cocaine use being around [place] Years. At that time complained of coughing up brown mucus which is still a major complaint today. Also complains of increased SOB and swollen ankles that were evident on exam. Has 8 children with a baby on the way now. Medicaid approved. Diet significantly improved (was previously dipping pork skins in ramen noodles). Weight is up and he says he has been watching it go up at home.  Multiple discrepancies in home medications as compared to medication list. These have been adjusted. He was taking Coreg 3.125mg  bid (was prescribed 6.25 but he had some issues at the pharmacy and wasn't able to get it), lasix 40 daily instead of twice daily, and digoxin (not on med list at all).    . Shortness of breath/dyspnea on exertion? yes - when walking up steps, carry anything heavy . Orthopnea/PND? yes . Edema? no . Lightheadedness/dizziness? Yes - really light headed and dizzy when standing up and getting up quickly . Daily weights at home? Yes - 245 at home with slow trend up  . Blood pressure/heart rate monitoring at home? no . Following low-sodium/fluid-restricted diet? Yes - this is a new  change for him  HF Medications: Lasix 20 mg BID Entresto 24/26 BID Coreg 3.125mg  BID Digoxin (unknown dose) Potassium 40 mEq daily  Spironolactone 12.5 (half tablet) daily  Has the patient been experiencing any side effects to the medications prescribed?  no  Does the patient have any problems obtaining medications due to transportation or finances?   no  Understanding of regimen: poor Understanding of indications: poor Potential of compliance: poor Patient understands to avoid NSAIDs. Patient understands to avoid decongestants.    Pertinent Lab Values: . 09/14/16: Serum creatinine 0.89 (baseline 0.8 - 0.9), CO2 26, Potassium 3.9, Sodium 137   Vital Signs: . Weight: 250.4 lbs (dry weight: 240 - 245) . Blood pressure: 154/100 mmHg . Heart rate: 120 bpm  Assessment: 1. 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 h/o of rx with adriamycin. Likely contributing factor to his cardiomyopathy. NYHA class IIsymptoms.  - BP elevated, HR elevated, compliance seems like an issue, weight up, complaints of SOB, and medications were off from last visit  - Increase Entresto to 49/51mg  twice daily (provided more samples until Medicaid PA approved) and resume taking Lasix 40 mg BID instead of daily with KCl 40 mEq daily  - Stop taking digoxin  - Continue Coreg 3.125mg  BID (follow up at next visit if able to increase seemed a little confused on which prescription he actually has, confirmed with Joellen Jersey that at home he has the 3.125mg ) - Basic disease state pathophysiology, medication indication, mechanism and side effects reviewed at length  with patient and he verbalized understanding 2. HTN Urgency - BP elevated today but much improved from last HF clinic visit - Increasing Entresto today as above 3. H/o Non-Hodgkins Lymphoma - h/o of rx with adriamycin. Likely contributing factor to his cardiomyopathy. 4. Snoring - Snores badly with + apneic episodes. Had an inconclusive sleep study in  the past.  - Needs repeat sleep study once stable  Plan: 1) Medication changes: Based on clinical presentation, vital signs and recent labs will increase Entresto to 49/51 BID, increase Lasix to 40 mg BID and STOP digoxin 2) Labs: BMET today  3) Follow-up: Carin Hock this Thursday, January 25th, 2018  Cruz Condon, PharmD, BCPS PGY2 Pharmacy Resident  Ruta Hinds. Velva Harman, PharmD, BCPS, CPP Clinical Pharmacist Pager: [phone] Phone: [phone] 09/14/2016 2:57 PM

## 2016-09-14 NOTE — Patient Instructions (Signed)
It was good seeing you today.   Please increase your Entresto dose to 49/51mg  twice daily.  Please increase your furosemide dose to 40mg  twice daily (6 hours apart from each other).  Stop taking your digoxin.  Labs today, we will call with any issues - specifically with your potassium.  Follow up with Jonni Sanger this Thursday, January 25th, 2018

## 2016-09-15 ENCOUNTER — Telehealth: Payer: Self-pay | Admitting: Licensed Clinical Social Worker

## 2016-09-15 MED FILL — FUROSEMIDE 40 MG TABLET: 40 | 30 days supply | Qty: 60 | Fill #0

## 2016-09-15 NOTE — Telephone Encounter (Signed)
CSW received return call from IM clinic with appointment for patient on September 23, 2016 @ 10:15am. CSW contacted patient and wife and informed of appointment. Wife verbalizes understanding and will follow up with patient to appointment. CSW available as needed and continuing coordination with paramedicine. Raquel Sarna, [place], [place]

## 2016-09-17 ENCOUNTER — Encounter (HOSPITAL_COMMUNITY): Payer: Medicaid Other

## 2016-09-21 ENCOUNTER — Telehealth (HOSPITAL_COMMUNITY): Payer: Self-pay | Admitting: Pharmacist

## 2016-09-21 NOTE — Telephone Encounter (Signed)
Entresto 49-51 mg BID PA approved by [place] Medicaid through 09/10/17.   Ruta Hinds. Velva Harman, PharmD, BCPS, CPP Clinical Pharmacist Pager: [phone] Phone: [phone] 09/21/2016 2:39 PM

## 2016-09-23 ENCOUNTER — Ambulatory Visit: Payer: Medicaid Other

## 2016-09-28 ENCOUNTER — Ambulatory Visit (HOSPITAL_COMMUNITY)
Admission: RE | Admit: 2016-09-28 | Discharge: 2016-09-28 | Disposition: A | Discharge status: Home / Self Care | Payer: Medicaid Other | Source: Ambulatory Visit | Attending: Cardiology | Admitting: Cardiology

## 2016-09-28 VITALS — BP 138/98 | HR 121 | Wt 244.8 lb

## 2016-09-28 DIAGNOSIS — Z923 Personal history of irradiation: Secondary | ICD-10-CM | POA: Diagnosis not present

## 2016-09-28 DIAGNOSIS — J449 Chronic obstructive pulmonary disease, unspecified: Secondary | ICD-10-CM | POA: Insufficient documentation

## 2016-09-28 DIAGNOSIS — G4730 Sleep apnea, unspecified: Secondary | ICD-10-CM | POA: Insufficient documentation

## 2016-09-28 DIAGNOSIS — I160 Hypertensive urgency: Secondary | ICD-10-CM | POA: Insufficient documentation

## 2016-09-28 DIAGNOSIS — G894 Chronic pain syndrome: Secondary | ICD-10-CM

## 2016-09-28 DIAGNOSIS — I10 Essential (primary) hypertension: Secondary | ICD-10-CM | POA: Diagnosis not present

## 2016-09-28 DIAGNOSIS — I5022 Chronic systolic (congestive) heart failure: Secondary | ICD-10-CM

## 2016-09-28 DIAGNOSIS — Z809 Family history of malignant neoplasm, unspecified: Secondary | ICD-10-CM | POA: Diagnosis not present

## 2016-09-28 DIAGNOSIS — Z836 Family history of other diseases of the respiratory system: Secondary | ICD-10-CM | POA: Diagnosis not present

## 2016-09-28 DIAGNOSIS — Z8572 Personal history of non-Hodgkin lymphomas: Secondary | ICD-10-CM | POA: Diagnosis not present

## 2016-09-28 DIAGNOSIS — Z87891 Personal history of nicotine dependence: Secondary | ICD-10-CM | POA: Insufficient documentation

## 2016-09-28 DIAGNOSIS — K219 Gastro-esophageal reflux disease without esophagitis: Secondary | ICD-10-CM | POA: Diagnosis not present

## 2016-09-28 DIAGNOSIS — E079 Disorder of thyroid, unspecified: Secondary | ICD-10-CM | POA: Insufficient documentation

## 2016-09-28 DIAGNOSIS — Z9221 Personal history of antineoplastic chemotherapy: Secondary | ICD-10-CM | POA: Insufficient documentation

## 2016-09-28 DIAGNOSIS — I5042 Chronic combined systolic (congestive) and diastolic (congestive) heart failure: Secondary | ICD-10-CM | POA: Insufficient documentation

## 2016-09-28 DIAGNOSIS — R0683 Snoring: Secondary | ICD-10-CM | POA: Diagnosis present

## 2016-09-28 DIAGNOSIS — I110 Hypertensive heart disease with heart failure: Secondary | ICD-10-CM | POA: Insufficient documentation

## 2016-09-28 DIAGNOSIS — I2510 Atherosclerotic heart disease of native coronary artery without angina pectoris: Secondary | ICD-10-CM | POA: Insufficient documentation

## 2016-09-28 LAB — BASIC METABOLIC PANEL
Anion gap: 10 (ref 5–15)
BUN: 15 mg/dL (ref 6–20)
CALCIUM: 9.3 mg/dL (ref 8.9–10.3)
CO2: 26 mmol/L (ref 22–32)
Chloride: 102 mmol/L (ref 101–111)
Creatinine, Ser: 1.45 mg/dL — ABNORMAL HIGH (ref 0.61–1.24)
GFR, EST NON AFRICAN AMERICAN: 60 mL/min — AB (ref >60)
Glucose, Bld: 91 mg/dL (ref 65–99)
POTASSIUM: 4.3 mmol/L (ref 3.5–5.1)
SODIUM: 138 mmol/L (ref 135–145)

## 2016-09-28 MED ORDER — GABAPENTIN 800 MG PO TABS: 800.0000 mg | ORAL_TABLET | Freq: Three times a day (TID) | ORAL | 0 refills | Status: DC

## 2016-09-28 MED ORDER — CARVEDILOL 12.5 MG PO TABS: 12.5000 mg | ORAL_TABLET | Freq: Two times a day (BID) | ORAL | 3 refills | Status: DC

## 2016-09-28 MED FILL — PANTOPRAZOLE SOD DR 40 MG T: 40 | 30 days supply | Qty: 30 | Fill #1

## 2016-09-28 MED FILL — SPIRONOLACTONE 25 MG TABLET: 25 | 34 days supply | Qty: 17 | Fill #1

## 2016-09-28 MED FILL — KLOR-CON M20 TABLET: 20 | 30 days supply | Qty: 60 | Fill #2

## 2016-09-28 MED FILL — GABAPENTIN 800 MG TABLET: 800 | 30 days supply | Qty: 90 | Fill #0

## 2016-09-28 NOTE — Addendum Note (Signed)
Encounter addended by: Kennieth Rad, RN on: 09/28/2016 10:14 AM<BR>    Actions taken: Sign clinical note

## 2016-09-28 NOTE — Patient Instructions (Addendum)
Labs today (will call for abnormal results, otherwise no news is good news)  Increase Coreg to 12.5 mg (1 Tablet) Two times daily  Sleep study has been ordered for you.  Follow up with our Doroteo Bradford, PharmD in 3 weeks  Follow up in clinic in 6 weeks.

## 2016-09-28 NOTE — Progress Notes (Signed)
Advanced Heart Failure Medication Review by a Pharmacist  Does the patient  feel that his/her medications are working for him/her?  yes  Has the patient been experiencing any side effects to the medications prescribed?  no  Does the patient measure his/her own blood pressure or blood glucose at home?  no   Does the patient have any problems obtaining medications due to transportation or finances?   no  Understanding of regimen: good Understanding of indications: good Potential of compliance: good Patient understands to avoid NSAIDs. Patient understands to avoid decongestants.  Issues to address at subsequent visits: None   Pharmacist comments:  Leonard [name] is a pleasant 41 yo M presenting with his girlfriend and without a medication list but with better understanding of his regimen. He reports good compliance with his regimen and admits to only missing one day of his medications early last week 2/2 feeling sick. He did not have any other medication-related questions or concerns for me at this time.   Ruta Hinds. Velva Harman, PharmD, BCPS, CPP Clinical Pharmacist Pager: [phone] Phone: [phone] 09/28/2016 9:50 AM      Time with patient: 10 minutes Preparation and documentation time: 2 minutes Total time: 12 minutes

## 2016-09-28 NOTE — Progress Notes (Signed)
Advanced Heart Failure Clinic Note   Primary Care: None per patient Primary Cardiologist: Dr. Aundra Dubin   HPI:  Leonard [name] is a 41 y.o. male  with history of non-Hodgkin's lymphoma s/p Chemoradiation at [facility] - currently in remission, HTN, Asthma, GERD, and chronic pain.    He presented to [facility] 04/05/16 with worsening SOB and peripheral edema x 2 months, worse over the past two weeks. Also c/o of abdominal distention and orthopnea.  Pertinent labs on admission include K  4.1, Creatinine 1.05, BNP 722.6, Negative troponin. CXR showed vascular congestion and bibasilar airspace opacities.   Echo 04/07/16 LVEF 25-30%, Grade 2 DD, Mild AR, Mild MR, PA peak pressure 47 mm Hg  He presents today for follow up.  He was scheduled for 2 week follow up in September, but has not been seen since then.  Has been following with PCP, but was fired from PCP office yesterday with + cocaine and negative prescribed meds on UDS. Weight down 3 lbs since last visit in September. Had some family issues and was away from home for quite a while.  BP very high in clinic today. Last time he did cocaine was prior to new years.  Denies lightheadedness, dizziness, numbness, tingling, HAs. Denies Blurred vision. Coughing up brown mucous for over a week now. States his whole house was sick.  Not weighing daily. Still having his atypical chest pain. No SOB on flat ground. + DOE with hills, stairs, or pushing/pully heavy objects.  Not working.  Has been out a couple of months.  Does have medicaid now.   He presents today for follow up. At last visit had been off all of his medications so they were restarted. Was then seen in follow up in pharmacy clinic and entresto and lasix increased. Digoxin stopped. Weight down 6 lbs from pharmacy visit. Breathing has been better since going up on lasix. SOB with steps or moving heavy objects. Denies dizziness and lightheadedness. No more than his usual atypical chest pain. Weight at home ~  243-245. Out of work currently. A few prospects. Denies any further drug use. Trying to watch salt and fluid. Cheated last night.    Social History: Currently in between jobs  Lives in [place] with Wife, Mother in [place], Daughter, and step-Daughter.  Has another baby on the way.  (Will be his 9th kid overall). Recently started job in a warehouse.  Will get benefits in 90 days from start.    Family History: No significant cardiovascular history.  Mom passed from Lung CA.   Past Medical History  [facility] 04/15/16  Dominance: Right  Left Main  Short, no significant CAD.  Left Anterior Descending  20-30% proximal LAD stenosis prior to small D1.  Left Circumflex  No significant CAD.  Right Coronary Artery  No significant CAD.   Hemodynamics (mmHg) RA mean 13 RV 52/17 PA 55/23, mean 38 PCWP mean 26 Oxygen saturations: PA 55% AO 98% Cardiac Output (Fick) 4.55  Cardiac Index (Fick) 1.98 PVR 2.6 WU  Past Medical History:  Diagnosis Date  . Asthma   . Blood transfusion without reported diagnosis   . CHF (congestive heart failure) ([place]) 04/05/2016  . COPD (chronic obstructive pulmonary disease) ([place])   . Hypertension   . Lymphoma ([place])    x 2  . Sleep apnea   . Thyroid disease    Current Outpatient Prescriptions on File Prior to Encounter  Medication Sig Dispense Refill  . acetaminophen (TYLENOL) 500 MG tablet Take 1 tablet (  500 mg total) by mouth every 6 (six) hours as needed. 30 tablet 0  . albuterol (PROVENTIL HFA;VENTOLIN HFA) 108 (90 Base) MCG/ACT inhaler Inhale 2 puffs into the lungs every 6 (six) hours as needed for wheezing or shortness of breath. 3 Inhaler 3  . carvedilol (COREG) 3.125 MG tablet Take 3.125 mg by mouth 2 (two) times daily with a meal.    . Fluticasone-Salmeterol (ADVAIR) 250-50 MCG/DOSE AEPB Inhale 2 puffs into the lungs 2 (two) times daily.    . furosemide (LASIX) 40 MG tablet Take 1 tablet (40 mg total) by mouth 2 (two) times daily. 60 tablet 5  .  gabapentin (NEURONTIN) 800 MG tablet Take 1 tablet (800 mg total) by mouth 3 (three) times daily. 90 tablet 3  . HYDROcodone-acetaminophen (LORTAB) 5-325 MG tablet Take 1 tablet by mouth every 6 (six) hours as needed for moderate pain. May fill on 09/06/2016- NOT before 120 tablet 0  . pantoprazole (PROTONIX) 40 MG tablet Take 1 tablet (40 mg total) by mouth daily. 30 tablet 6  . potassium chloride SA (K-DUR,KLOR-CON) 20 MEQ tablet Take 40 mEq by mouth daily.    . sacubitril-valsartan (ENTRESTO) 49-51 MG Take 1 tablet by mouth 2 (two) times daily. 60 tablet 5  . spironolactone (ALDACTONE) 25 MG tablet TAKE 1/2 TABLET BY MOUTH EACH DAY 17 tablet 2  . albuterol (PROVENTIL) (2.5 MG/3ML) 0.083% nebulizer solution Take 3 mLs (2.5 mg total) by nebulization every 6 (six) hours as needed for wheezing or shortness of breath. (Patient not taking: Reported on 09/28/2016) 75 mL 12  . levothyroxine (SYNTHROID, LEVOTHROID) 50 MCG tablet Take 1 tablet (50 mcg total) by mouth daily before breakfast. (Patient not taking: Reported on 09/28/2016) 30 tablet 2   No current facility-administered medications on file prior to encounter.    No Known Allergies  Social History   Social History  . Marital status: Single    Spouse name: N/A  . Number of children: N/A  . Years of education: N/A   Occupational History  . Not on file.   Social History Main Topics  . Smoking status: Former Smoker    Packs/day: 1.25    Years: 10.00    Types: Cigarettes    Quit date: 04/08/2007  . Smokeless tobacco: Current User    Types: Snuff  . Alcohol use No  . Drug use: No  . Sexual activity: Yes   Other Topics Concern  . Not on file   Social History Narrative  . No narrative on file   Family History  Problem Relation Age of Onset  . Cancer Mother   . Emphysema Mother   . Bronchitis Mother     Vitals:   09/28/16 0938  BP: (!) 138/98  Pulse: (!) 121  SpO2: 98%  Weight: 244 lb 12.8 oz (111 kg)   Wt Readings from  Last 3 Encounters:  09/28/16 244 lb 12.8 oz (111 kg)  09/14/16 250 lb 6.4 oz (113.6 kg)  09/03/16 242 lb (109.8 kg)    PHYSICAL EXAM: General: Well appearing. NAD. Wife and daughter present.   HEENT: Normal -Neck: supple. JVP 6-7 cm. Carotids 2+ bilat; no bruits. No thyromegaly or nodule noted.  Cor: PMI nondisplaced. Regular, slightly tachy.   Lungs: CTAB, normal effort.  Abdomen: Obese, soft, NT, ND, no HSM. No bruits or masses. +BS  Extremities: no cyanosis, clubbing, rash. Trace ankle edema at most.   Neuro: alert & orientedx3, cranial nerves grossly intact. moves all 4 extremities  w/o difficulty. Affect  Flat  EKG Sinus tachycardia 103 bpm  ASSESSMENT & PLAN:  1. Chronic combined CHF - Diagnosed 03/2016- Echo 04/07/16 LVEF 25-30%, Grade 2 DD, Peak PA pressure 47 mm Hg.  - RHC 04/15/16 with marginal cardiac output and RA 13.  - Volume status looks relatively stable. Continue Lasix 40 mg po BID with KCL 40 meq. BMET/BNP - Increase coreg to 12.5 mg BID. Explained risks of concurrent cocaine use with BBs.    - Continue Entresto 49/51 mg BID.   - Negative Hepatitis and HIV panels. Negative ANA and RF.  Normal UPEP, No M spike on SPEP.  - Reinforced fluid restriction to < 2 L daily, sodium restriction to less than 2000 mg daily, and the importance of daily weights.   2. HTN Urgency - Mildly elevated on arrival to clinic but improved from previous.  - Meds as above.   3. H/o Non-Hodgkins Lymphoma - h/o of rx with adriamycin. Likely contributing factor to his cardiomyopathy.No change 4. Snoring - Snores badly with + apneic episodes.  - Will send for repeat sleep study.   Labs and meds as above. Follow up with pharmacy 3 weeks and Dr. Aundra Dubin in 6 weeks.   I have filled one months worth of Gabapentin while he waits to hear back from PCP about scheduling.   Shirley Friar, PA-C 09/28/16   Total time spent > 25 minutes. Over half that spent discussing the above

## 2016-09-29 ENCOUNTER — Telehealth (HOSPITAL_COMMUNITY): Payer: Self-pay | Admitting: Cardiology

## 2016-09-29 DIAGNOSIS — I509 Heart failure, unspecified: Secondary | ICD-10-CM

## 2016-09-29 MED FILL — CARVEDILOL 12.5 MG TABLET: 12.5 | 30 days supply | Qty: 60 | Fill #0

## 2016-09-29 NOTE — Telephone Encounter (Signed)
-----   Message from Shirley Friar, PA-C sent at 09/29/2016 10:20 AM EST ----- Please try and call today.    Legrand Como [address] [place], PA-C 09/29/2016 10:20 AM

## 2016-09-29 NOTE — Telephone Encounter (Signed)
Patient aware. Recheck 2/14

## 2016-10-06 ENCOUNTER — Other Ambulatory Visit: Payer: Self-pay

## 2016-10-06 ENCOUNTER — Ambulatory Visit (INDEPENDENT_AMBULATORY_CARE_PROVIDER_SITE_OTHER): Payer: Medicaid Other | Admitting: Internal Medicine

## 2016-10-06 ENCOUNTER — Encounter: Payer: Self-pay | Admitting: Internal Medicine

## 2016-10-06 VITALS — BP 116/65 | HR 107 | Temp 97.9°F | Ht 72.0 in | Wt 242.4 lb

## 2016-10-06 DIAGNOSIS — R946 Abnormal results of thyroid function studies: Secondary | ICD-10-CM

## 2016-10-06 DIAGNOSIS — R0683 Snoring: Secondary | ICD-10-CM

## 2016-10-06 DIAGNOSIS — I110 Hypertensive heart disease with heart failure: Secondary | ICD-10-CM

## 2016-10-06 DIAGNOSIS — Z87891 Personal history of nicotine dependence: Secondary | ICD-10-CM | POA: Diagnosis not present

## 2016-10-06 DIAGNOSIS — F1410 Cocaine abuse, uncomplicated: Secondary | ICD-10-CM

## 2016-10-06 DIAGNOSIS — I10 Essential (primary) hypertension: Secondary | ICD-10-CM

## 2016-10-06 DIAGNOSIS — R0981 Nasal congestion: Secondary | ICD-10-CM

## 2016-10-06 DIAGNOSIS — Z809 Family history of malignant neoplasm, unspecified: Secondary | ICD-10-CM

## 2016-10-06 DIAGNOSIS — F1911 Other psychoactive substance abuse, in remission: Secondary | ICD-10-CM | POA: Insufficient documentation

## 2016-10-06 DIAGNOSIS — Z8572 Personal history of non-Hodgkin lymphomas: Secondary | ICD-10-CM

## 2016-10-06 DIAGNOSIS — Z923 Personal history of irradiation: Secondary | ICD-10-CM

## 2016-10-06 DIAGNOSIS — Z825 Family history of asthma and other chronic lower respiratory diseases: Secondary | ICD-10-CM

## 2016-10-06 DIAGNOSIS — Z9221 Personal history of antineoplastic chemotherapy: Secondary | ICD-10-CM

## 2016-10-06 DIAGNOSIS — G894 Chronic pain syndrome: Secondary | ICD-10-CM | POA: Diagnosis not present

## 2016-10-06 DIAGNOSIS — R05 Cough: Secondary | ICD-10-CM | POA: Diagnosis not present

## 2016-10-06 DIAGNOSIS — R0681 Apnea, not elsewhere classified: Secondary | ICD-10-CM | POA: Diagnosis not present

## 2016-10-06 DIAGNOSIS — I5042 Chronic combined systolic (congestive) and diastolic (congestive) heart failure: Secondary | ICD-10-CM | POA: Diagnosis not present

## 2016-10-06 DIAGNOSIS — Z79899 Other long term (current) drug therapy: Secondary | ICD-10-CM

## 2016-10-06 DIAGNOSIS — K219 Gastro-esophageal reflux disease without esophagitis: Secondary | ICD-10-CM

## 2016-10-06 DIAGNOSIS — J45909 Unspecified asthma, uncomplicated: Secondary | ICD-10-CM | POA: Diagnosis not present

## 2016-10-06 DIAGNOSIS — J40 Bronchitis, not specified as acute or chronic: Secondary | ICD-10-CM

## 2016-10-06 DIAGNOSIS — R7989 Other specified abnormal findings of blood chemistry: Secondary | ICD-10-CM | POA: Insufficient documentation

## 2016-10-06 MED ORDER — AMOXICILLIN-POT CLAVULANATE 875-125 MG PO TABS: 1.0000 | ORAL_TABLET | Freq: Two times a day (BID) | ORAL | 0 refills | Status: DC

## 2016-10-06 MED ORDER — FLUTICASONE-SALMETEROL 250-50 MCG/DOSE IN AEPB: 2.0000 | INHALATION_SPRAY | Freq: Two times a day (BID) | RESPIRATORY_TRACT | 3 refills | Status: DC

## 2016-10-06 MED ORDER — ALBUTEROL SULFATE (2.5 MG/3ML) 0.083% IN NEBU: 2.5000 mg | INHALATION_SOLUTION | Freq: Four times a day (QID) | RESPIRATORY_TRACT | 12 refills | Status: DC | PRN

## 2016-10-06 MED ORDER — GUAIFENESIN-DM 100-10 MG/5ML PO SYRP: 5.0000 mL | ORAL_SOLUTION | ORAL | 0 refills | Status: DC | PRN

## 2016-10-06 MED ORDER — PANTOPRAZOLE SODIUM 40 MG PO TBEC: 40.0000 mg | DELAYED_RELEASE_TABLET | Freq: Every day | ORAL | 11 refills | Status: DC

## 2016-10-06 MED ORDER — GABAPENTIN 800 MG PO TABS: 800.0000 mg | ORAL_TABLET | Freq: Three times a day (TID) | ORAL | 0 refills | Status: DC

## 2016-10-06 MED ORDER — FLUTICASONE PROPIONATE 50 MCG/ACT NA SUSP: 2.0000 | Freq: Every day | NASAL | 2 refills | Status: DC

## 2016-10-06 MED ORDER — ALBUTEROL SULFATE HFA 108 (90 BASE) MCG/ACT IN AERS: 2.0000 | INHALATION_SPRAY | Freq: Four times a day (QID) | RESPIRATORY_TRACT | 3 refills | Status: DC | PRN

## 2016-10-06 MED FILL — ALBUTEROL 0.083% INHAL SOLN: (2.5 MG/3ML | 6 days supply | Qty: 75 | Fill #0

## 2016-10-06 MED FILL — AMOX-CLAV 875-125 MG TABLET: 875-125 | 5 days supply | Qty: 10 | Fill #0

## 2016-10-06 MED FILL — FLUTICASONE PROP 50 MCG SPR: 50 | 30 days supply | Qty: 16 | Fill #0

## 2016-10-06 MED FILL — PROAIR HFA 90 MCG INHALER: 108 (90 BAS | 25 days supply | Qty: 9 | Fill #0

## 2016-10-06 NOTE — Progress Notes (Signed)
Internal Medicine Clinic Attending  Case discussed with Dr. Burns at the time of the visit.  We reviewed the resident's history and exam and pertinent patient test results.  I agree with the assessment, diagnosis, and plan of care documented in the resident's note.  

## 2016-10-06 NOTE — Assessment & Plan Note (Addendum)
Patient was fired from his PCP given positive cocaine and no evidence of his prescribed pain medications on his urine drug screen. Patient reports that he last used cocaine 3 weeks ago. I again discussed the risks of cocaine use in the setting of beta blockers. He voiced understanding and cessation of cocaine. He denies alcohol or tobacco use.

## 2016-10-06 NOTE — Assessment & Plan Note (Addendum)
Patient reports a history of chronic pain due to radiation therapy of his chest and neck. He was previously receiving hydrocodone-acetaminophen 5-325 #120 pills per month from his primary care physician. Per report, he was fired from his primary care physician due to positive urine drug screen with cocaine and no evidence of hydrocodone on his urine drug screen. He denies alcohol or tobacco use. He understands the risk of use of cocaine in the setting of beta blockers. He states his last use was 3 weeks ago. He is also on gabapentin 800 mg 3 times daily for pain.  Plan: -Referral to pain clinic -Given his red flags and do not feel he is an appropriate candidate for chronic pain medication from the internal medicine clinic -Refilled gabapentin

## 2016-10-06 NOTE — Progress Notes (Signed)
CC: Establish care for heart failure  HPI: Mr.Leonard [name] is a 41 y.o. male with PMHx of history of non-Hodgkin's lymphoma status post chemoradiation with Adriamycin at [facility] in remission, illicit drug use, hypertension, asthma, GERD, chronic systolic heart failure who presents to the clinic for establishing care for treatment of hypertension and heart failure.   Patient was admitted to the hospital in August 2017 for acute on chronic heart failure exacerbation. At that time, echocardiogram revealed EF of 25-30 percent, grade 2 diastolic dysfunction, mild aortic regurgitation, mild mitral regurgitation, peak PA pressure 47. Patient most recently saw the heart failure clinic on 09/28/2016. At that time, he admitted to dyspnea on exertion and shortness of breath with moving heavy objects, but no increased chest pain, lightheadedness. At that visit, Lasix 40 mg twice a day was continued along with potassium 40 mEq daily. His Coreg was increased to 12.5 mg twice a day. His interest to twice a day was continued. His weight at that visit was 244 pounds.  Patient was fired from his PCP given positive cocaine and no evidence of his prescribed pain medications on his urine drug screen. Patient reports that he lasted cocaine prior to [place].  Patient also has a history of snoring with apneic episodes. Heart failure as ordered a sleep study.   Past Medical History:  Diagnosis Date  . Asthma   . Blood transfusion without reported diagnosis   . CHF (congestive heart failure) ([place]) 04/05/2016  . COPD (chronic obstructive pulmonary disease) ([place])   . Hypertension   . Lymphoma ([place])    x 2  . Sleep apnea   . Thyroid disease    Social History   Social History  . Marital status: Single    Spouse name: N/A  . Number of children: N/A  . Years of education: N/A   Social History Main Topics  . Smoking status: Former Smoker    Packs/day: 1.25    Years: 10.00    Types: Cigarettes    Quit  date: 04/08/2007  . Smokeless tobacco: Current User    Types: Snuff  . Alcohol use No  . Drug use: No  . Sexual activity: Yes   Other Topics Concern  . Not on file   Social History Narrative  . No narrative on file    Past Surgical History:  Procedure Laterality Date  . CARDIAC CATHETERIZATION N/A 04/15/2016   Procedure: Right/Left Heart Cath and Coronary Angiography;  Surgeon: Larey Dresser, MD;  Location: [facility];  Service: Cardiovascular;  Laterality: N/A;  . tumor biopsy    . WISDOM TOOTH EXTRACTION      Family History  Problem Relation Age of Onset  . Cancer Mother   . Emphysema Mother   . Bronchitis Mother      Review of Systems: Please see pertinent ROS reviewed in HPI and problem based charting.   Physical Exam: Vitals:   10/06/16 0911  BP: 116/65  Pulse: (!) 107  Temp: 97.9 F (36.6 C)  TempSrc: Oral  SpO2: 97%  Weight: 242 lb 6.4 oz (110 kg)  Height: 6' (1.829 m)   General: Vital signs reviewed.  Patient is well-developed and well-nourished, in no acute distress and cooperative with exam.  Head: Normocephalic and atraumatic. Eyes: PERRLA, conjunctivae normal, no scleral icterus.  Nose: Erythematous, edematous nasal turbinates Ears: Normal tympanic membranes bilaterally Neck: Supple, trachea midline, no JVD, or carotid bruit present.  Cardiovascular: Tachycardic, regular rhythm, S1 normal,  S2 normal Pulmonary/Chest: Clear to auscultation bilaterally, no wheezes, rales, or rhonchi. Abdominal: Soft, non-tender, non-distended, BS + Extremities: No lower extremity edema bilaterally Skin: Warm, dry and intact.  Assessment & Plan:  See encounters tab for problem based medical decision making. Patient discussed with Dr. Angelia Mould

## 2016-10-06 NOTE — Assessment & Plan Note (Addendum)
Patient was admitted to the hospital in August 2017 for acute on chronic heart failure exacerbation. At that time, echocardiogram revealed EF of 25-30 percent, grade 2 diastolic dysfunction, mild aortic regurgitation, mild mitral regurgitation, peak PA pressure 47. Patient most recently saw the heart failure clinic on 09/28/2016. At that time, he admitted to dyspnea on exertion and shortness of breath with moving heavy objects, but no increased chest pain, lightheadedness. At that visit, Lasix 40 mg twice a day was continued along with potassium 40 mEq daily. His Coreg was increased to 12.5 mg twice a day. His Intresto twice a day was continued. Weight at that visit was 244. Today, weight is 242. Patient reports compliance with his medications and denies worsened shortness of breath or dyspnea on exertion. He denies increased swelling in his lower extremities  Plan: -Continue spironolactone 12.5 mg once a day -Continue Lasix 40 mg twice a day -Continue carvedilol 12.5 mg twice a day -Continue Entresto BID -Continue daily weights

## 2016-10-06 NOTE — Assessment & Plan Note (Signed)
BP Readings from Last 3 Encounters:  10/06/16 116/65  09/28/16 (!) 138/98  09/14/16 (!) 154/100    Lab Results  Component Value Date   NA 138 09/28/2016   K 4.3 09/28/2016   CREATININE 1.45 (H) 09/28/2016    Assessment: Blood pressure control:  Controlled Progress toward BP goal:   At goal Comments: Compliant with Coreg 12.5 mg twice a day, Lasix 40 mg twice a day, spironolactone 12.5 mg daily, sacubitril-valsartan BID  Plan: Medications:  continue current medications

## 2016-10-06 NOTE — Telephone Encounter (Signed)
Called pt - no answer; left message to call us back. 

## 2016-10-06 NOTE — Assessment & Plan Note (Addendum)
The patient has a history of asthma and is on an albuterol inhaler as needed and Advair twice a day. He also uses albuterol nebulizers. He states his breathing has been worse than normal given his cold-like symptoms. Today I refilled his albuterol inhaler and nebulizer medications and Advair. Patient will need to return to more fully address his asthma control. I would consider PFTs in this patient as well.

## 2016-10-06 NOTE — Assessment & Plan Note (Signed)
Patient has a history of asthma and possible COPD. He reports a 3 week history of increased productive cough of green and brown sputum, subjective fevers and chills, wheezing, nasal congestion. He has tried DayQuil and NyQuil without relief.  Patient likely has a viral URI, however in the setting of his chronic illnesses and persistent symptoms causing shortness of breath and wheezing I favor treating him with an antibiotic.  Plan: -Augmentin twice a day for 5 days -Mucinex -Flonase

## 2016-10-06 NOTE — Patient Instructions (Signed)
[name],  For your cough: Take Augmentin 1 pill twice a day for 5 days. Use Flonase 2 sprays each nostril once a day. Use Robitussin cough syrup every 4 hours as needed.  For your pain: I have represcribed her gabapentin. We will refer you to the pain clinic.  For your asthma: Use her albuterol inhaler as needed. I have also prescribed to nebulizer treatments and her Advair inhaler. Use her Advair twice a day.  Continue all other medications as prescribed.

## 2016-10-06 NOTE — Assessment & Plan Note (Addendum)
Patient has a history of non-Hodgkin's lymphoma treated at [facility]. He was first diagnosed in 2002 with stage IV non-Hodgkin's lymphoma and reached remission in 2004. He again was diagnosed with stage II non-Hodgkin's lymphoma in 2013 and is status post chemoradiation with Adriamycin. He is currently in remission. He reports chronic neck and chest pain from radiation therapy. He has not followed up with oncology and states that a referral has been placed for him to see oncology here in [place]. I do not see record of this in Epic.  Plan: -Referral to oncology

## 2016-10-06 NOTE — Telephone Encounter (Signed)
Needs to speak with a nurse regarding Advair.

## 2016-10-06 NOTE — Assessment & Plan Note (Addendum)
Patient has a history of elevated TSH. Last TSH was in August 2017. TSH at that time was 6. He was previously on levothyroxine 50 g daily, but is no longer taking this.  Plan: Check TSH  Addendum: -TSH elevated at 7 -No prior history of free T4 in our system -Check free T4 follow-up visit to confirm hypothyroidism

## 2016-10-06 NOTE — Assessment & Plan Note (Signed)
Patient also has a history of snoring with apneic episodes. Heart failure as ordered a sleep study.

## 2016-10-07 ENCOUNTER — Encounter: Payer: Self-pay | Admitting: Internal Medicine

## 2016-10-07 ENCOUNTER — Inpatient Hospital Stay (HOSPITAL_COMMUNITY): Admission: RE | Admit: 2016-10-07 | Payer: Medicaid Other | Source: Ambulatory Visit

## 2016-10-07 LAB — TSH: TSH: 7.17 u[IU]/mL — AB (ref 0.450–4.500)

## 2016-10-08 ENCOUNTER — Telehealth: Payer: Self-pay | Admitting: *Deleted

## 2016-10-08 ENCOUNTER — Telehealth (HOSPITAL_COMMUNITY): Payer: Self-pay

## 2016-10-08 NOTE — Telephone Encounter (Signed)
rtc to pt, he answered and stated he is good now, ask if there was anything I could help with and he states no

## 2016-10-08 NOTE — Telephone Encounter (Signed)
We can change to one puff once a day. That was a mistake, thank you.

## 2016-10-08 NOTE — Telephone Encounter (Signed)
Fax from [place] to clarify Advair directions - states usually 1 puff BID. Rx written for 2 puffs BID.  Thanks

## 2016-10-14 ENCOUNTER — Telehealth: Payer: Self-pay | Admitting: Licensed Clinical Social Worker

## 2016-10-14 NOTE — Telephone Encounter (Signed)
Patient seen by paramedicine program in the community. Patient shared some struggles with paramedic who asked CSW to give patient a call for supportive intervention. CSW contacted patient and message left for return call. Raquel Sarna, [place], [place]

## 2016-10-17 ENCOUNTER — Emergency Department (HOSPITAL_COMMUNITY): Payer: Medicaid Other

## 2016-10-17 ENCOUNTER — Inpatient Hospital Stay (HOSPITAL_COMMUNITY)
Admission: EM | Admit: 2016-10-17 | Discharge: 2016-10-19 | DRG: 336 | Disposition: A | Discharge status: Home / Self Care | Payer: Medicaid Other | Attending: Oncology | Admitting: Oncology

## 2016-10-17 ENCOUNTER — Encounter (HOSPITAL_COMMUNITY): Payer: Self-pay | Admitting: Emergency Medicine

## 2016-10-17 DIAGNOSIS — F1490 Cocaine use, unspecified, uncomplicated: Secondary | ICD-10-CM | POA: Diagnosis present

## 2016-10-17 DIAGNOSIS — Z923 Personal history of irradiation: Secondary | ICD-10-CM

## 2016-10-17 DIAGNOSIS — I509 Heart failure, unspecified: Secondary | ICD-10-CM

## 2016-10-17 DIAGNOSIS — F1911 Other psychoactive substance abuse, in remission: Secondary | ICD-10-CM

## 2016-10-17 DIAGNOSIS — Z825 Family history of asthma and other chronic lower respiratory diseases: Secondary | ICD-10-CM

## 2016-10-17 DIAGNOSIS — R911 Solitary pulmonary nodule: Secondary | ICD-10-CM | POA: Diagnosis present

## 2016-10-17 DIAGNOSIS — I110 Hypertensive heart disease with heart failure: Secondary | ICD-10-CM | POA: Diagnosis present

## 2016-10-17 DIAGNOSIS — Z6832 Body mass index (BMI) 32.0-32.9, adult: Secondary | ICD-10-CM

## 2016-10-17 DIAGNOSIS — I429 Cardiomyopathy, unspecified: Secondary | ICD-10-CM | POA: Diagnosis present

## 2016-10-17 DIAGNOSIS — G894 Chronic pain syndrome: Secondary | ICD-10-CM | POA: Diagnosis present

## 2016-10-17 DIAGNOSIS — Z809 Family history of malignant neoplasm, unspecified: Secondary | ICD-10-CM

## 2016-10-17 DIAGNOSIS — Z9221 Personal history of antineoplastic chemotherapy: Secondary | ICD-10-CM

## 2016-10-17 DIAGNOSIS — K460 Unspecified abdominal hernia with obstruction, without gangrene: Secondary | ICD-10-CM

## 2016-10-17 DIAGNOSIS — K432 Incisional hernia without obstruction or gangrene: Principal | ICD-10-CM | POA: Diagnosis present

## 2016-10-17 DIAGNOSIS — I5042 Chronic combined systolic (congestive) and diastolic (congestive) heart failure: Secondary | ICD-10-CM | POA: Diagnosis present

## 2016-10-17 DIAGNOSIS — I10 Essential (primary) hypertension: Secondary | ICD-10-CM | POA: Diagnosis present

## 2016-10-17 DIAGNOSIS — Z79899 Other long term (current) drug therapy: Secondary | ICD-10-CM

## 2016-10-17 DIAGNOSIS — J449 Chronic obstructive pulmonary disease, unspecified: Secondary | ICD-10-CM | POA: Diagnosis present

## 2016-10-17 DIAGNOSIS — Z8572 Personal history of non-Hodgkin lymphomas: Secondary | ICD-10-CM

## 2016-10-17 DIAGNOSIS — K5900 Constipation, unspecified: Secondary | ICD-10-CM | POA: Diagnosis present

## 2016-10-17 DIAGNOSIS — Z7951 Long term (current) use of inhaled steroids: Secondary | ICD-10-CM

## 2016-10-17 DIAGNOSIS — Z87891 Personal history of nicotine dependence: Secondary | ICD-10-CM

## 2016-10-17 DIAGNOSIS — I428 Other cardiomyopathies: Secondary | ICD-10-CM | POA: Diagnosis present

## 2016-10-17 DIAGNOSIS — E669 Obesity, unspecified: Secondary | ICD-10-CM | POA: Diagnosis present

## 2016-10-17 DIAGNOSIS — F17290 Nicotine dependence, other tobacco product, uncomplicated: Secondary | ICD-10-CM | POA: Diagnosis present

## 2016-10-17 DIAGNOSIS — K660 Peritoneal adhesions (postprocedural) (postinfection): Secondary | ICD-10-CM | POA: Diagnosis present

## 2016-10-17 DIAGNOSIS — K420 Umbilical hernia with obstruction, without gangrene: Secondary | ICD-10-CM

## 2016-10-17 DIAGNOSIS — T451X5A Adverse effect of antineoplastic and immunosuppressive drugs, initial encounter: Secondary | ICD-10-CM | POA: Diagnosis present

## 2016-10-17 DIAGNOSIS — R918 Other nonspecific abnormal finding of lung field: Secondary | ICD-10-CM

## 2016-10-17 DIAGNOSIS — G4730 Sleep apnea, unspecified: Secondary | ICD-10-CM | POA: Diagnosis present

## 2016-10-17 DIAGNOSIS — I5043 Acute on chronic combined systolic (congestive) and diastolic (congestive) heart failure: Secondary | ICD-10-CM | POA: Diagnosis present

## 2016-10-17 LAB — COMPREHENSIVE METABOLIC PANEL
ALT: 15 U/L — ABNORMAL LOW (ref 17–63)
AST: 23 U/L (ref 15–41)
Albumin: 3.3 g/dL — ABNORMAL LOW (ref 3.5–5.0)
Alkaline Phosphatase: 69 U/L (ref 38–126)
Anion gap: 13 (ref 5–15)
BUN: 16 mg/dL (ref 6–20)
CHLORIDE: 102 mmol/L (ref 101–111)
CO2: 23 mmol/L (ref 22–32)
Calcium: 9 mg/dL (ref 8.9–10.3)
Creatinine, Ser: 0.96 mg/dL (ref 0.61–1.24)
Glucose, Bld: 104 mg/dL — ABNORMAL HIGH (ref 65–99)
POTASSIUM: 3.6 mmol/L (ref 3.5–5.1)
Sodium: 138 mmol/L (ref 135–145)
Total Bilirubin: 0.3 mg/dL (ref 0.3–1.2)
Total Protein: 7.7 g/dL (ref 6.5–8.1)

## 2016-10-17 LAB — BRAIN NATRIURETIC PEPTIDE: B NATRIURETIC PEPTIDE 5: 561.2 pg/mL — AB (ref 0.0–100.0)

## 2016-10-17 LAB — URINALYSIS, ROUTINE W REFLEX MICROSCOPIC
BACTERIA UA: NONE SEEN
Bilirubin Urine: NEGATIVE
Glucose, UA: NEGATIVE mg/dL
Hgb urine dipstick: NEGATIVE
KETONES UR: NEGATIVE mg/dL
LEUKOCYTES UA: NEGATIVE
Nitrite: NEGATIVE
PH: 6 (ref 5.0–8.0)
Protein, ur: 30 mg/dL — AB
SQUAMOUS EPITHELIAL / LPF: NONE SEEN
Specific Gravity, Urine: 1.028 (ref 1.005–1.030)

## 2016-10-17 LAB — CBC
HEMATOCRIT: 30.1 % — AB (ref 39.0–52.0)
Hemoglobin: 9 g/dL — ABNORMAL LOW (ref 13.0–17.0)
MCH: 21.8 pg — ABNORMAL LOW (ref 26.0–34.0)
MCHC: 29.9 g/dL — ABNORMAL LOW (ref 30.0–36.0)
MCV: 73.1 fL — AB (ref 78.0–100.0)
PLATELETS: 443 10*3/uL — AB (ref 150–400)
RBC: 4.12 MIL/uL — ABNORMAL LOW (ref 4.22–5.81)
RDW: 17.3 % — ABNORMAL HIGH (ref 11.5–15.5)
WBC: 8.2 10*3/uL (ref 4.0–10.5)

## 2016-10-17 LAB — TROPONIN I: Troponin I: <0.03 ng/mL (ref <0.03)

## 2016-10-17 LAB — LIPASE, BLOOD: LIPASE: 34 U/L (ref 11–51)

## 2016-10-17 MED ORDER — FUROSEMIDE 10 MG/ML IJ SOLN
40.0000 mg | INTRAMUSCULAR | Status: AC
  Administered 2016-10-17: 40 mg via INTRAVENOUS
  Filled 2016-10-17: qty 4

## 2016-10-17 MED ORDER — MORPHINE SULFATE (PF) 4 MG/ML IV SOLN
4.0000 mg | Freq: Once | INTRAVENOUS | Status: AC
  Administered 2016-10-17: 4 mg via INTRAVENOUS
  Filled 2016-10-17: qty 1

## 2016-10-17 MED ORDER — IOPAMIDOL (ISOVUE-300) INJECTION 61%
INTRAVENOUS | Status: AC
  Administered 2016-10-17: 100 mL
  Filled 2016-10-17: qty 100

## 2016-10-17 NOTE — ED Notes (Signed)
Patient transported to X-ray 

## 2016-10-17 NOTE — ED Triage Notes (Signed)
Pt c/o abdominal pain ongoing for about 1 week. Pt denies N/V. Describes pain as constant contractions. Pt has mesh for hernia repair and feels maybe he tore it. Pt denies doing anything to cause injury.

## 2016-10-17 NOTE — ED Provider Notes (Signed)
[facility] DEPT Provider Note   CSN: [id number] Arrival date & time: 10/17/16 1639     History    Chief Complaint  Patient presents with  . Abdominal Pain     HPI Leonard [name] is a 41 y.o. male.  41yo M w/ extensive PMH including Non-Hodgkin's lymphoma, CHF, HTN, asthma who p/w abdominal pain. PT reports 1 week of constant central abdominal pain just above umbilicus near where he had previous hernia repair w/ mesh. He is concerned about a problem with the mesh. His pain is constantly 5/10 but intermittently becomes more severe at 10/10. It feels like cramping/contractions. He denies any associated fevers, nausea, vomiting, diarrhea, or blood in his stool. He does report mild constipation, last bowel movement was yesterday. He reports intermittent shortness of breath that correlates with his Lasix use but states that he has not had any progressively worsening shortness of breath recently. His lower leg swelling also fluctuates but he denies any significant weight gain. He denies any chest pain. No urinary sx.   Past Medical History:  Diagnosis Date  . Asthma   . Blood transfusion without reported diagnosis   . CHF (congestive heart failure) ([place]) 04/05/2016  . COPD (chronic obstructive pulmonary disease) ([place])   . Hypertension   . Lymphoma ([place])    x 2  . Sleep apnea   . Thyroid disease      Patient Active Problem List   Diagnosis Date Noted  . Asthma 10/06/2016  . History of substance abuse 10/06/2016  . TSH elevation 10/06/2016  . Bronchitis 10/06/2016  . Chronic pain syndrome 10/06/2016  . History of non-Hodgkin's lymphoma 04/30/2016  . Snoring 04/30/2016  . Chronic combined systolic and diastolic congestive heart failure ([place]) 04/05/2016  . Essential hypertension 10/09/2015    Past Surgical History:  Procedure Laterality Date  . CARDIAC CATHETERIZATION N/A 04/15/2016   Procedure: Right/Left Heart Cath and Coronary Angiography;  Surgeon: Larey Dresser, MD;   Location: [facility];  Service: Cardiovascular;  Laterality: N/A;  . tumor biopsy    . WISDOM TOOTH EXTRACTION          Home Medications    Prior to Admission medications   Medication Sig Start Date End Date Taking? Authorizing Provider  acetaminophen (TYLENOL) 500 MG tablet Take 1 tablet (500 mg total) by mouth every 6 (six) hours as needed. 01/01/16   Marella Chimes, PA-C  albuterol (PROVENTIL HFA;VENTOLIN HFA) 108 (90 Base) MCG/ACT inhaler Inhale 2 puffs into the lungs every 6 (six) hours as needed for wheezing or shortness of breath. 10/06/16   Alexa Angela Burke, MD  albuterol (PROVENTIL) (2.5 MG/3ML) 0.083% nebulizer solution Take 3 mLs (2.5 mg total) by nebulization every 6 (six) hours as needed for wheezing or shortness of breath. 10/06/16   Florinda Marker, MD  amoxicillin-clavulanate (AUGMENTIN) 875-125 MG tablet Take 1 tablet by mouth 2 (two) times daily. 10/06/16   Florinda Marker, MD  carvedilol (COREG) 12.5 MG tablet Take 1 tablet (12.5 mg total) by mouth 2 (two) times daily with a meal. 09/28/16   Shirley Friar, PA-C  fluticasone [facility]) 50 MCG/ACT nasal spray Place 2 sprays into both nostrils daily. 10/06/16   Florinda Marker, MD  Fluticasone-Salmeterol (ADVAIR) 250-50 MCG/DOSE AEPB Inhale 2 puffs into the lungs 2 (two) times daily. 10/06/16   Alexa Angela Burke, MD  furosemide (LASIX) 40 MG tablet Take 1 tablet (40 mg total) by mouth 2 (two) times daily. 09/14/16 12/13/16  [place]ton  Claris Gladden, MD  gabapentin (NEURONTIN) 800 MG tablet Take 1 tablet (800 mg total) by mouth 3 (three) times daily. 10/06/16   Alexa Angela Burke, MD  guaiFENesin-dextromethorphan (ROBITUSSIN DM) 100-10 MG/5ML syrup Take 5 mLs by mouth every 4 (four) hours as needed for cough. 10/06/16   Florinda Marker, MD  Multiple Vitamins-Minerals (MULTIVITAMIN WITH MINERALS) tablet Take 1 tablet by mouth daily.    Historical Provider, MD  pantoprazole (PROTONIX) 40 MG tablet Take 1 tablet (40 mg total) by mouth daily. 10/06/16    Alexa Angela Burke, MD  potassium chloride SA (K-DUR,KLOR-CON) 20 MEQ tablet Take 40 mEq by mouth daily.    Historical Provider, MD  sacubitril-valsartan (ENTRESTO) 49-51 MG Take 1 tablet by mouth 2 (two) times daily. 09/14/16   Larey Dresser, MD  spironolactone (ALDACTONE) 25 MG tablet TAKE 1/2 TABLET BY MOUTH EACH DAY 09/03/16   Amy Estrella Deeds, NP      Family History  Problem Relation Age of Onset  . Cancer Mother   . Emphysema Mother   . Bronchitis Mother      Social History  Substance Use Topics  . Smoking status: Former Smoker    Packs/day: 1.25    Years: 10.00    Types: Cigarettes    Quit date: 04/08/2007  . Smokeless tobacco: Current User    Types: Snuff  . Alcohol use No     Allergies     Patient has no known allergies.    Review of Systems  10 Systems reviewed and are negative for acute change except as noted in the HPI.   Physical Exam Updated Vital Signs BP (!) 180/102   Pulse 109   Temp 98.4 F (36.9 C) (Oral)   Resp 22   Ht 6' (1.829 m)   Wt 242 lb (109.8 kg)   SpO2 98%   BMI 32.82 kg/m   Physical Exam  Constitutional: He is oriented to person, place, and time. He appears well-developed and well-nourished. No distress.  HENT:  Head: Normocephalic and atraumatic.  Moist mucous membranes  Eyes: Conjunctivae are normal. Pupils are equal, round, and reactive to light.  Neck: Neck supple.  Cardiovascular: Regular rhythm.  Tachycardia present.   Murmur heard. Pulmonary/Chest:  dypsneic without respiratory distress, diminished bs b/l bases w/ occasional wheeze  Abdominal: Soft. Bowel sounds are normal. He exhibits no distension. There is tenderness.  Tenderness just above umbilicus with mild firmness, no obvious hernia  Musculoskeletal: He exhibits edema (mild BLE).  Neurological: He is alert and oriented to person, place, and time.  Normal gait, Fluent speech  Skin: Skin is warm and dry.  Psychiatric: He has a normal mood and affect. Judgment  normal.  Nursing note and vitals reviewed.     ED Treatments / Results  Labs (all labs ordered are listed, but only abnormal results are displayed) Labs Reviewed  COMPREHENSIVE METABOLIC PANEL - Abnormal; Notable for the following:       Result Value   Glucose, Bld 104 (*)    Albumin 3.3 (*)    ALT 15 (*)    All other components within normal limits  CBC - Abnormal; Notable for the following:    RBC 4.12 (*)    Hemoglobin 9.0 (*)    HCT 30.1 (*)    MCV 73.1 (*)    MCH 21.8 (*)    MCHC 29.9 (*)    RDW 17.3 (*)    Platelets 443 (*)    All other components  within normal limits  URINALYSIS, ROUTINE W REFLEX MICROSCOPIC - Abnormal; Notable for the following:    Protein, ur 30 (*)    All other components within normal limits  BRAIN NATRIURETIC PEPTIDE - Abnormal; Notable for the following:    B Natriuretic Peptide 561.2 (*)    All other components within normal limits  LIPASE, BLOOD  TROPONIN I     EKG  EKG Interpretation  Date/Time:    Ventricular Rate:    PR Interval:    QRS Duration:   QT Interval:    QTC Calculation:   R Axis:     Text Interpretation:           Radiology Dg Chest 2 View  Result Date: 10/17/2016 CLINICAL DATA:  41 year old male with shortness of breath. History of CHF. EXAM: CHEST  2 VIEW COMPARISON:  Chest radiograph dated 06/08/2016 FINDINGS: Focal area of hazy nodularity in the right lower lobe appears new since the prior radiograph and may represent atelectasis/ scarring or developing infiltrate. Clinical correlation is recommended. Stable left apical pleuroparenchymal density again noted. There is no pleural effusion or pneumothorax. The cardiac silhouette is within normal limits. No acute osseous pathology. IMPRESSION: 1. Focal hazy density in the right lower lobe, new since prior radiograph. This may represent developing infiltrate. Clinical correlation and follow-up recommended. 2. Left apical pleuroparenchymal scarring, stable.  Electronically Signed   By: Anner Crete M.D.   On: 10/17/2016 21:49   Ct Abdomen Pelvis W Contrast  Result Date: 10/17/2016 CLINICAL DATA:  Umbilical pain.  History of abdominal hernia repair. EXAM: CT ABDOMEN AND PELVIS WITH CONTRAST TECHNIQUE: Multidetector CT imaging of the abdomen and pelvis was performed using the standard protocol following bolus administration of intravenous contrast. CONTRAST:  117mL ISOVUE-300 IOPAMIDOL (ISOVUE-300) INJECTION 61% COMPARISON:  Chest CT 10/09/2015 FINDINGS: Lower chest: Somewhat elliptical masslike consolidation with irregular borders and a few punctate internal calcifications over the right lower lobe new compared to the previous exam. No effusion. Hepatobiliary: Couple tiny subcentimeter hypodensities within the liver too small to characterize but likely cysts. Gallbladder and biliary tree are normal. Pancreas: Within normal. Spleen: Within normal. Adrenals/Urinary Tract: Adrenal glands are normal. Kidneys are normal in size without hydronephrosis or nephrolithiasis. Ureters and bladder are normal. Stomach/Bowel: Stomach is normal. There is a small umbilical hernia containing a short segment of small bowel as the bowel loop just proximal to the hernia is dilated and fluid-filled measuring 3.5 cm in diameter. Appendix is normal. Colon is normal. Vascular/Lymphatic: Within normal. Reproductive: Within normal. Other: No free fluid or focal inflammatory change. No free peritoneal air. Musculoskeletal: Unremarkable. IMPRESSION: Small umbilical hernia containing a short segment of small bowel as the small bowel proximal to the hernia is fluid-filled and dilated measuring 3.5 cm in diameter. This is likely early/ partial obstruction due to incarceration in this small umbilical hernia. Irregular bordered masslike consolidation containing punctate calcifications within the right lower lobe. Appearance is concerning for a neoplastic process as cannot exclude lymphomatous  involvement in this patient with a history of non-Hodgkin's lymphoma. Less likely this could be due to an infectious/inflammatory process. Recommend contrast-enhanced chest CT for complete evaluation of the thorax. Couple subcentimeter liver hypodensities too small to characterize but likely cysts. These results were called by telephone at the time of interpretation on 10/17/2016 at 10:28 pm to Dr. Theotis Burrow , who verbally acknowledged these results. Electronically Signed   By: Marin Olp M.D.   On: 10/17/2016 22:22    Procedures  Procedures (including critical care time) Procedures  Medications Ordered in ED  Medications  morphine 4 MG/ML injection 4 mg (4 mg Intravenous Given 10/17/16 2048)  iopamidol (ISOVUE-300) 61 % injection (100 mLs  Contrast Given 10/17/16 2132)  furosemide (LASIX) injection 40 mg (40 mg Intravenous Given 10/17/16 2237)     Initial Impression / Assessment and Plan / ED Course  I have reviewed the triage vital signs and the nursing notes.  Pertinent labs & imaging results that were available during my care of the patient were reviewed by me and considered in my medical decision making (see chart for details).     PT w/ h/o hernia repair p/w 1 week of worsening central abd pain and mild constipation. He was mildly dyspneic on exam without respiratory distress. Mild tachycardia on exam but normal O2 saturation. He had tenderness just above his umbilicus without rebound or peritonitis. His lab work shows anemia with hemoglobin of 9 which is slightly lower than past labs but he denies any black or bloody stools. Other labs unremarkable. Obtained CT to r/o obstruction or mesh complication. Also added CXR and BNP to evaluate volume status.  Chest x-ray shows right lower lobe infiltrate versus scarring. BNP suggestive of mild CHF exacerbation at 561. Troponin negative. CT shows incarcerated umbilical hernia with subsequent early/partial obstruction. I discussed these  findings with the surgical team for Dr. Ninfa Linden, and he will see the patient once finished in OR. Pt also has RLL masslike consolidation concerning for neoplasm. I discussed these findings with the radiologist and relayed this information to the patient. He will need a nonemergent chest CT for better evaluation. Gave the patient a dose of IV Lasix. Given his heart failure exacerbation and new lung mass, I discussed admission with internal medicine teaching service, Dr. Juleen China. Patient admitted to internal medicine and Gen. surgery will consult.  Final Clinical Impressions(s) / ED Diagnoses   Final diagnoses:  None     New Prescriptions   No medications on file       Sharlett Iles, MD 10/17/16 2313

## 2016-10-18 ENCOUNTER — Inpatient Hospital Stay (HOSPITAL_COMMUNITY): Payer: Medicaid Other | Admitting: Certified Registered"

## 2016-10-18 ENCOUNTER — Encounter (HOSPITAL_COMMUNITY)
Admission: EM | Disposition: A | Discharge status: Home / Self Care | Payer: Self-pay | Source: Home / Self Care | Attending: Oncology

## 2016-10-18 ENCOUNTER — Encounter (HOSPITAL_COMMUNITY): Payer: Self-pay | Admitting: Certified Registered Nurse Anesthetist

## 2016-10-18 DIAGNOSIS — I110 Hypertensive heart disease with heart failure: Secondary | ICD-10-CM

## 2016-10-18 DIAGNOSIS — K432 Incisional hernia without obstruction or gangrene: Secondary | ICD-10-CM | POA: Diagnosis present

## 2016-10-18 DIAGNOSIS — G894 Chronic pain syndrome: Secondary | ICD-10-CM | POA: Diagnosis present

## 2016-10-18 DIAGNOSIS — Z7951 Long term (current) use of inhaled steroids: Secondary | ICD-10-CM | POA: Diagnosis not present

## 2016-10-18 DIAGNOSIS — Z87891 Personal history of nicotine dependence: Secondary | ICD-10-CM | POA: Diagnosis not present

## 2016-10-18 DIAGNOSIS — R109 Unspecified abdominal pain: Secondary | ICD-10-CM | POA: Diagnosis present

## 2016-10-18 DIAGNOSIS — F1490 Cocaine use, unspecified, uncomplicated: Secondary | ICD-10-CM | POA: Diagnosis present

## 2016-10-18 DIAGNOSIS — J449 Chronic obstructive pulmonary disease, unspecified: Secondary | ICD-10-CM | POA: Diagnosis present

## 2016-10-18 DIAGNOSIS — Z8719 Personal history of other diseases of the digestive system: Secondary | ICD-10-CM | POA: Diagnosis not present

## 2016-10-18 DIAGNOSIS — I428 Other cardiomyopathies: Secondary | ICD-10-CM | POA: Diagnosis present

## 2016-10-18 DIAGNOSIS — E669 Obesity, unspecified: Secondary | ICD-10-CM | POA: Diagnosis present

## 2016-10-18 DIAGNOSIS — Z825 Family history of asthma and other chronic lower respiratory diseases: Secondary | ICD-10-CM | POA: Diagnosis not present

## 2016-10-18 DIAGNOSIS — Z8572 Personal history of non-Hodgkin lymphomas: Secondary | ICD-10-CM

## 2016-10-18 DIAGNOSIS — J45909 Unspecified asthma, uncomplicated: Secondary | ICD-10-CM

## 2016-10-18 DIAGNOSIS — G4730 Sleep apnea, unspecified: Secondary | ICD-10-CM | POA: Diagnosis present

## 2016-10-18 DIAGNOSIS — K5900 Constipation, unspecified: Secondary | ICD-10-CM | POA: Diagnosis present

## 2016-10-18 DIAGNOSIS — F17290 Nicotine dependence, other tobacco product, uncomplicated: Secondary | ICD-10-CM

## 2016-10-18 DIAGNOSIS — Z9221 Personal history of antineoplastic chemotherapy: Secondary | ICD-10-CM | POA: Diagnosis not present

## 2016-10-18 DIAGNOSIS — Z6832 Body mass index (BMI) 32.0-32.9, adult: Secondary | ICD-10-CM | POA: Diagnosis not present

## 2016-10-18 DIAGNOSIS — L818 Other specified disorders of pigmentation: Secondary | ICD-10-CM

## 2016-10-18 DIAGNOSIS — I5042 Chronic combined systolic (congestive) and diastolic (congestive) heart failure: Secondary | ICD-10-CM | POA: Diagnosis present

## 2016-10-18 DIAGNOSIS — K420 Umbilical hernia with obstruction, without gangrene: Secondary | ICD-10-CM | POA: Diagnosis not present

## 2016-10-18 DIAGNOSIS — R918 Other nonspecific abnormal finding of lung field: Secondary | ICD-10-CM | POA: Diagnosis not present

## 2016-10-18 DIAGNOSIS — Z923 Personal history of irradiation: Secondary | ICD-10-CM | POA: Diagnosis not present

## 2016-10-18 DIAGNOSIS — K660 Peritoneal adhesions (postprocedural) (postinfection): Secondary | ICD-10-CM | POA: Diagnosis present

## 2016-10-18 DIAGNOSIS — K460 Unspecified abdominal hernia with obstruction, without gangrene: Secondary | ICD-10-CM

## 2016-10-18 DIAGNOSIS — I509 Heart failure, unspecified: Secondary | ICD-10-CM | POA: Insufficient documentation

## 2016-10-18 DIAGNOSIS — R911 Solitary pulmonary nodule: Secondary | ICD-10-CM | POA: Diagnosis present

## 2016-10-18 DIAGNOSIS — C8590 Non-Hodgkin lymphoma, unspecified, unspecified site: Secondary | ICD-10-CM | POA: Diagnosis not present

## 2016-10-18 DIAGNOSIS — F1410 Cocaine abuse, uncomplicated: Secondary | ICD-10-CM | POA: Diagnosis not present

## 2016-10-18 DIAGNOSIS — Z79899 Other long term (current) drug therapy: Secondary | ICD-10-CM

## 2016-10-18 DIAGNOSIS — Z9889 Other specified postprocedural states: Secondary | ICD-10-CM | POA: Diagnosis not present

## 2016-10-18 DIAGNOSIS — Z809 Family history of malignant neoplasm, unspecified: Secondary | ICD-10-CM

## 2016-10-18 DIAGNOSIS — T451X5A Adverse effect of antineoplastic and immunosuppressive drugs, initial encounter: Secondary | ICD-10-CM | POA: Diagnosis present

## 2016-10-18 DIAGNOSIS — I429 Cardiomyopathy, unspecified: Secondary | ICD-10-CM | POA: Diagnosis present

## 2016-10-18 HISTORY — PX: LAPAROSCOPIC ASSISTED SPIGELIAN HERNIA REPAIR: SHX6763

## 2016-10-18 LAB — RAPID URINE DRUG SCREEN, HOSP PERFORMED
AMPHETAMINES: NOT DETECTED
Barbiturates: NOT DETECTED
Benzodiazepines: NOT DETECTED
Cocaine: POSITIVE — AB
Opiates: POSITIVE — AB
TETRAHYDROCANNABINOL: NOT DETECTED

## 2016-10-18 LAB — BASIC METABOLIC PANEL
ANION GAP: 12 (ref 5–15)
BUN: 12 mg/dL (ref 6–20)
CHLORIDE: 101 mmol/L (ref 101–111)
CO2: 23 mmol/L (ref 22–32)
CREATININE: 0.88 mg/dL (ref 0.61–1.24)
Calcium: 8.7 mg/dL — ABNORMAL LOW (ref 8.9–10.3)
GFR calc non Af Amer: >60 mL/min (ref >60)
Glucose, Bld: 91 mg/dL (ref 65–99)
Potassium: 3.7 mmol/L (ref 3.5–5.1)
Sodium: 136 mmol/L (ref 135–145)

## 2016-10-18 LAB — CBC
HCT: 30.3 % — ABNORMAL LOW (ref 39.0–52.0)
HEMOGLOBIN: 9 g/dL — AB (ref 13.0–17.0)
MCH: 21.5 pg — AB (ref 26.0–34.0)
MCHC: 29.7 g/dL — ABNORMAL LOW (ref 30.0–36.0)
MCV: 72.5 fL — AB (ref 78.0–100.0)
Platelets: 444 10*3/uL — ABNORMAL HIGH (ref 150–400)
RBC: 4.18 MIL/uL — AB (ref 4.22–5.81)
RDW: 17.5 % — ABNORMAL HIGH (ref 11.5–15.5)
WBC: 6.2 10*3/uL (ref 4.0–10.5)

## 2016-10-18 LAB — SURGICAL PCR SCREEN
MRSA, PCR: NEGATIVE
STAPHYLOCOCCUS AUREUS: POSITIVE — AB

## 2016-10-18 SURGERY — REPAIR, HERNIA, SPIGELIAN, LAPAROSCOPIC
Anesthesia: General | Site: Abdomen

## 2016-10-18 SURGERY — Surgical Case
Anesthesia: *Unknown

## 2016-10-18 MED ORDER — EPHEDRINE 5 MG/ML INJ
INTRAVENOUS | Status: AC
  Filled 2016-10-18: qty 10

## 2016-10-18 MED ORDER — SUGAMMADEX SODIUM 200 MG/2ML IV SOLN
INTRAVENOUS | Status: AC
  Filled 2016-10-18: qty 8

## 2016-10-18 MED ORDER — FENTANYL CITRATE (PF) 100 MCG/2ML IJ SOLN
INTRAMUSCULAR | Status: DC | PRN
  Administered 2016-10-18: 50 ug via INTRAVENOUS
  Administered 2016-10-18: 200 ug via INTRAVENOUS
  Administered 2016-10-18: 50 ug via INTRAVENOUS

## 2016-10-18 MED ORDER — BUPIVACAINE-EPINEPHRINE 0.25% -1:200000 IJ SOLN
INTRAMUSCULAR | Status: DC | PRN
  Administered 2016-10-18: 30 mL

## 2016-10-18 MED ORDER — ACETAMINOPHEN 650 MG RE SUPP: 650.0000 mg | Freq: Four times a day (QID) | RECTAL | Status: DC | PRN

## 2016-10-18 MED ORDER — MORPHINE SULFATE (PF) 4 MG/ML IV SOLN
2.0000 mg | INTRAVENOUS | Status: DC | PRN
  Administered 2016-10-18 (×2): 2 mg via INTRAVENOUS
  Filled 2016-10-18 (×2): qty 1

## 2016-10-18 MED ORDER — PHENYLEPHRINE 40 MCG/ML (10ML) SYRINGE FOR IV PUSH (FOR BLOOD PRESSURE SUPPORT)
PREFILLED_SYRINGE | INTRAVENOUS | Status: AC
  Filled 2016-10-18: qty 10

## 2016-10-18 MED ORDER — EPHEDRINE SULFATE 50 MG/ML IJ SOLN
INTRAMUSCULAR | Status: DC | PRN
  Administered 2016-10-18: 10 mg via INTRAVENOUS

## 2016-10-18 MED ORDER — MEPERIDINE HCL 25 MG/ML IJ SOLN: 6.2500 mg | INTRAMUSCULAR | Status: DC | PRN

## 2016-10-18 MED ORDER — FENTANYL CITRATE (PF) 100 MCG/2ML IJ SOLN
INTRAMUSCULAR | Status: AC
  Filled 2016-10-18: qty 4

## 2016-10-18 MED ORDER — ONDANSETRON HCL 4 MG/2ML IJ SOLN
INTRAMUSCULAR | Status: AC
  Filled 2016-10-18: qty 2

## 2016-10-18 MED ORDER — PROPOFOL 10 MG/ML IV BOLUS
INTRAVENOUS | Status: DC | PRN
  Administered 2016-10-18: 80 mg via INTRAVENOUS

## 2016-10-18 MED ORDER — POTASSIUM CHLORIDE CRYS ER 20 MEQ PO TBCR
40.0000 meq | EXTENDED_RELEASE_TABLET | Freq: Every day | ORAL | Status: DC
  Administered 2016-10-18 – 2016-10-19 (×2): 40 meq via ORAL
  Filled 2016-10-18 (×2): qty 2

## 2016-10-18 MED ORDER — SUGAMMADEX SODIUM 500 MG/5ML IV SOLN
INTRAVENOUS | Status: AC
  Filled 2016-10-18: qty 5

## 2016-10-18 MED ORDER — SUGAMMADEX SODIUM 200 MG/2ML IV SOLN
INTRAVENOUS | Status: DC | PRN
  Administered 2016-10-18: 250 mg via INTRAVENOUS

## 2016-10-18 MED ORDER — CEFAZOLIN SODIUM-DEXTROSE 2-3 GM-% IV SOLR
INTRAVENOUS | Status: DC | PRN
  Administered 2016-10-18: 2 g via INTRAVENOUS

## 2016-10-18 MED ORDER — LIDOCAINE 2% (20 MG/ML) 5 ML SYRINGE
INTRAMUSCULAR | Status: AC
  Filled 2016-10-18: qty 5

## 2016-10-18 MED ORDER — ALBUTEROL SULFATE (2.5 MG/3ML) 0.083% IN NEBU: 2.5000 mg | INHALATION_SOLUTION | Freq: Four times a day (QID) | RESPIRATORY_TRACT | Status: DC | PRN

## 2016-10-18 MED ORDER — HYDROMORPHONE HCL 1 MG/ML IJ SOLN
0.2500 mg | INTRAMUSCULAR | Status: DC | PRN
  Administered 2016-10-18: 0.5 mg via INTRAVENOUS

## 2016-10-18 MED ORDER — PHENYLEPHRINE HCL 10 MG/ML IJ SOLN
INTRAVENOUS | Status: DC | PRN
  Administered 2016-10-18: 30 ug/min via INTRAVENOUS

## 2016-10-18 MED ORDER — KETOROLAC TROMETHAMINE 15 MG/ML IJ SOLN
15.0000 mg | Freq: Three times a day (TID) | INTRAMUSCULAR | Status: DC
  Administered 2016-10-18 – 2016-10-19 (×3): 15 mg via INTRAVENOUS
  Filled 2016-10-18 (×2): qty 1

## 2016-10-18 MED ORDER — ADULT MULTIVITAMIN W/MINERALS CH
1.0000 | ORAL_TABLET | Freq: Every day | ORAL | Status: DC
  Administered 2016-10-18 – 2016-10-19 (×2): 1 via ORAL
  Filled 2016-10-18 (×2): qty 1

## 2016-10-18 MED ORDER — GABAPENTIN 400 MG PO CAPS
800.0000 mg | ORAL_CAPSULE | Freq: Three times a day (TID) | ORAL | Status: DC
  Administered 2016-10-18 – 2016-10-19 (×3): 800 mg via ORAL
  Filled 2016-10-18 (×4): qty 2

## 2016-10-18 MED ORDER — SODIUM CHLORIDE 0.9 % IR SOLN
Status: DC | PRN
  Administered 2016-10-18: 1000 mL

## 2016-10-18 MED ORDER — MOMETASONE FURO-FORMOTEROL FUM 200-5 MCG/ACT IN AERO
2.0000 | INHALATION_SPRAY | Freq: Two times a day (BID) | RESPIRATORY_TRACT | Status: DC
  Administered 2016-10-18 – 2016-10-19 (×2): 2 via RESPIRATORY_TRACT
  Filled 2016-10-18 (×2): qty 8.8

## 2016-10-18 MED ORDER — DEXAMETHASONE SODIUM PHOSPHATE 10 MG/ML IJ SOLN
INTRAMUSCULAR | Status: DC | PRN
Start: 2016-10-18 — End: 2016-10-18
  Administered 2016-10-18: 8 mg via INTRAVENOUS

## 2016-10-18 MED ORDER — 0.9 % SODIUM CHLORIDE (POUR BTL) OPTIME
TOPICAL | Status: DC | PRN
  Administered 2016-10-18: 1000 mL

## 2016-10-18 MED ORDER — DEXAMETHASONE SODIUM PHOSPHATE 10 MG/ML IJ SOLN
INTRAMUSCULAR | Status: AC
  Filled 2016-10-18: qty 2

## 2016-10-18 MED ORDER — SACUBITRIL-VALSARTAN 49-51 MG PO TABS
1.0000 | ORAL_TABLET | Freq: Two times a day (BID) | ORAL | Status: DC
  Administered 2016-10-18 (×2): 1 via ORAL
  Filled 2016-10-18 (×3): qty 1

## 2016-10-18 MED ORDER — FUROSEMIDE 40 MG PO TABS
40.0000 mg | ORAL_TABLET | Freq: Two times a day (BID) | ORAL | Status: DC
  Administered 2016-10-18 – 2016-10-19 (×3): 40 mg via ORAL
  Filled 2016-10-18 (×3): qty 1

## 2016-10-18 MED ORDER — ROCURONIUM BROMIDE 50 MG/5ML IV SOSY
PREFILLED_SYRINGE | INTRAVENOUS | Status: AC
  Filled 2016-10-18: qty 5

## 2016-10-18 MED ORDER — ACETAMINOPHEN 325 MG PO TABS: 650.0000 mg | ORAL_TABLET | Freq: Four times a day (QID) | ORAL | Status: DC | PRN

## 2016-10-18 MED ORDER — ONDANSETRON HCL 4 MG/2ML IJ SOLN
4.0000 mg | Freq: Once | INTRAMUSCULAR | Status: AC | PRN
  Administered 2016-10-18: 4 mg via INTRAVENOUS

## 2016-10-18 MED ORDER — CARVEDILOL 12.5 MG PO TABS
12.5000 mg | ORAL_TABLET | Freq: Two times a day (BID) | ORAL | Status: DC
  Administered 2016-10-18 – 2016-10-19 (×2): 12.5 mg via ORAL
  Filled 2016-10-18 (×3): qty 1

## 2016-10-18 MED ORDER — ROCURONIUM BROMIDE 100 MG/10ML IV SOLN
INTRAVENOUS | Status: DC | PRN
  Administered 2016-10-18: 50 mg via INTRAVENOUS
  Administered 2016-10-18: 30 mg via INTRAVENOUS

## 2016-10-18 MED ORDER — KETOROLAC TROMETHAMINE 15 MG/ML IJ SOLN
INTRAMUSCULAR | Status: AC
  Filled 2016-10-18: qty 1

## 2016-10-18 MED ORDER — FLUTICASONE PROPIONATE 50 MCG/ACT NA SUSP
2.0000 | Freq: Every day | NASAL | Status: DC
  Administered 2016-10-18: 2 via NASAL
  Filled 2016-10-18: qty 16

## 2016-10-18 MED ORDER — LACTATED RINGERS IV SOLN
INTRAVENOUS | Status: DC
  Administered 2016-10-18 (×3): via INTRAVENOUS

## 2016-10-18 MED ORDER — MORPHINE SULFATE (PF) 2 MG/ML IV SOLN
2.0000 mg | INTRAVENOUS | Status: DC | PRN
  Administered 2016-10-18 – 2016-10-19 (×3): 2 mg via INTRAVENOUS
  Filled 2016-10-18 (×3): qty 1

## 2016-10-18 MED ORDER — OXYCODONE HCL 5 MG PO TABS
5.0000 mg | ORAL_TABLET | Freq: Four times a day (QID) | ORAL | Status: DC | PRN
  Administered 2016-10-18 – 2016-10-19 (×2): 5 mg via ORAL
  Filled 2016-10-18 (×3): qty 1

## 2016-10-18 MED ORDER — ONDANSETRON HCL 4 MG/2ML IJ SOLN: 4.0000 mg | Freq: Once | INTRAMUSCULAR | Status: DC | PRN

## 2016-10-18 MED ORDER — SPIRONOLACTONE 25 MG PO TABS
12.5000 mg | ORAL_TABLET | Freq: Every day | ORAL | Status: DC
  Administered 2016-10-18 – 2016-10-19 (×2): 12.5 mg via ORAL
  Filled 2016-10-18 (×2): qty 1

## 2016-10-18 MED ORDER — CEFAZOLIN SODIUM-DEXTROSE 2-4 GM/100ML-% IV SOLN
INTRAVENOUS | Status: AC
  Filled 2016-10-18: qty 100

## 2016-10-18 MED ORDER — FENTANYL CITRATE (PF) 100 MCG/2ML IJ SOLN
INTRAMUSCULAR | Status: AC
  Filled 2016-10-18: qty 2

## 2016-10-18 MED ORDER — LIDOCAINE HCL (CARDIAC) 20 MG/ML IV SOLN
INTRAVENOUS | Status: DC | PRN
  Administered 2016-10-18: 100 mg via INTRAVENOUS

## 2016-10-18 MED ORDER — BUPIVACAINE HCL (PF) 0.25 % IJ SOLN
INTRAMUSCULAR | Status: AC
  Filled 2016-10-18: qty 30

## 2016-10-18 MED ORDER — PROPOFOL 10 MG/ML IV BOLUS
INTRAVENOUS | Status: AC
  Filled 2016-10-18: qty 20

## 2016-10-18 MED ORDER — MIDAZOLAM HCL 5 MG/5ML IJ SOLN
INTRAMUSCULAR | Status: DC | PRN
  Administered 2016-10-18: 2 mg via INTRAVENOUS

## 2016-10-18 MED ORDER — HEPARIN SODIUM (PORCINE) 5000 UNIT/ML IJ SOLN
5000.0000 [IU] | Freq: Three times a day (TID) | INTRAMUSCULAR | Status: DC
  Administered 2016-10-18 – 2016-10-19 (×2): 5000 [IU] via SUBCUTANEOUS
  Filled 2016-10-18 (×2): qty 1

## 2016-10-18 MED ORDER — HYDROMORPHONE HCL 1 MG/ML IJ SOLN: 0.2500 mg | INTRAMUSCULAR | Status: DC | PRN

## 2016-10-18 MED ORDER — PANTOPRAZOLE SODIUM 40 MG PO TBEC
40.0000 mg | DELAYED_RELEASE_TABLET | Freq: Every day | ORAL | Status: DC
  Administered 2016-10-18 – 2016-10-19 (×2): 40 mg via ORAL
  Filled 2016-10-18 (×2): qty 1

## 2016-10-18 MED ORDER — MIDAZOLAM HCL 2 MG/2ML IJ SOLN
INTRAMUSCULAR | Status: AC
  Filled 2016-10-18: qty 2

## 2016-10-18 MED ORDER — PHENYLEPHRINE HCL 10 MG/ML IJ SOLN
INTRAMUSCULAR | Status: DC | PRN
  Administered 2016-10-18 (×2): 80 ug via INTRAVENOUS

## 2016-10-18 MED ORDER — HYDROMORPHONE HCL 1 MG/ML IJ SOLN
INTRAMUSCULAR | Status: AC
  Filled 2016-10-18: qty 1

## 2016-10-18 SURGICAL SUPPLY — 38 items
APPLIER CLIP 5 13 M/L LIGAMAX5 (MISCELLANEOUS)
BINDER ABD UNIV 10 28-50 (GAUZE/BANDAGES/DRESSINGS) ×1 IMPLANT
BINDER ABDOM UNIV 10 (GAUZE/BANDAGES/DRESSINGS) ×3
BLADE CLIPPER SURG (BLADE) ×3 IMPLANT
CHLORAPREP W/TINT 26ML (MISCELLANEOUS) ×3 IMPLANT
CLIP APPLIE 5 13 M/L LIGAMAX5 (MISCELLANEOUS) IMPLANT
COVER SURGICAL LIGHT HANDLE (MISCELLANEOUS) ×3 IMPLANT
DERMABOND ADVANCED (GAUZE/BANDAGES/DRESSINGS) ×2
DERMABOND ADVANCED .7 DNX12 (GAUZE/BANDAGES/DRESSINGS) ×1 IMPLANT
DEVICE SECURE STRAP 25 ABSORB (INSTRUMENTS) ×3 IMPLANT
DISSECT BALLN SPACEMKR + OVL (BALLOONS)
DISSECTOR BALLN SPACEMKR + OVL (BALLOONS) IMPLANT
ELECT REM PT RETURN 9FT ADLT (ELECTROSURGICAL) ×3
ELECTRODE REM PT RTRN 9FT ADLT (ELECTROSURGICAL) ×1 IMPLANT
GLOVE BIO SURGEON STRL SZ8.5 (GLOVE) ×3 IMPLANT
GLOVE BIOGEL PI IND STRL 7.0 (GLOVE) ×4 IMPLANT
GLOVE BIOGEL PI INDICATOR 7.0 (GLOVE) ×8
GLOVE SURG SS PI 7.0 STRL IVOR (GLOVE) ×3 IMPLANT
GOWN STRL REUS W/ TWL LRG LVL3 (GOWN DISPOSABLE) ×2 IMPLANT
GOWN STRL REUS W/TWL LRG LVL3 (GOWN DISPOSABLE) ×4
GRASPER SUT TROCAR 14GX15 (MISCELLANEOUS) ×3 IMPLANT
KIT BASIN OR (CUSTOM PROCEDURE TRAY) ×3 IMPLANT
KIT ROOM TURNOVER OR (KITS) ×3 IMPLANT
MESH VENTRALIGHT ST 6X8 (Mesh Specialty) ×2 IMPLANT
MESH VENTRLGHT ELLIPSE 8X6XMFL (Mesh Specialty) ×1 IMPLANT
NS IRRIG 1000ML POUR BTL (IV SOLUTION) ×3 IMPLANT
PACK LAPAROSCOPY I 1258 (SET/KITS/TRAYS/PACK) ×3 IMPLANT
PAD ARMBOARD 7.5X6 YLW CONV (MISCELLANEOUS) ×9 IMPLANT
SCISSORS LAP 5X35 DISP (ENDOMECHANICALS) ×3 IMPLANT
SET IRRIG TUBING LAPAROSCOPIC (IRRIGATION / IRRIGATOR) ×3 IMPLANT
SUT MNCRL AB 4-0 PS2 18 (SUTURE) ×3 IMPLANT
SUT NOVA NAB GS-21 0 18 T12 DT (SUTURE) ×6 IMPLANT
SUT VICRYL 0 UR6 27IN ABS (SUTURE) ×3 IMPLANT
TOWEL OR 17X24 6PK STRL BLUE (TOWEL DISPOSABLE) ×3 IMPLANT
TOWEL OR 17X26 10 PK STRL BLUE (TOWEL DISPOSABLE) ×3 IMPLANT
TROCAR BLADELESS 12MM (ENDOMECHANICALS) ×3 IMPLANT
TROCAR XCEL BLADELESS 5X75MML (TROCAR) ×9 IMPLANT
TUBING INSUFFLATION (TUBING) ×3 IMPLANT

## 2016-10-18 NOTE — Progress Notes (Signed)
   Subjective:  Leonard [name] is doing well this morning. He reports abdominal pain is improved with pain medications. No nausea/vomtiing. Eager to have surgery so he can eat again.   Objective:  Vital signs in last 24 hours: Vitals:   10/18/16 1435 10/18/16 1436 10/18/16 1438 10/18/16 1449  BP: (!) 83/50 (!) 74/43 105/72 101/61  Pulse: 97 96 95 92  Resp: 19 (!) 23 (!) 21 20  Temp: 97.7 F (36.5 C)     TempSrc:      SpO2: 95% 98% 96% 98%  Weight:      Height:       Physical Exam  Constitutional:  Obese male lying comfortably in bed in no acute distress  Cardiovascular: Normal rate and regular rhythm.   Pulmonary/Chest: Effort normal and breath sounds normal. No respiratory distress. He has no wheezes.  Abdominal: Soft. Bowel sounds are normal. He exhibits no distension. There is tenderness in the periumbilical area. There is no rigidity, no rebound and no guarding.  Musculoskeletal: He exhibits no edema.  Vitals reviewed.  Assessment/Plan:  Incarcerated hernia  Evaluated by surgery this morning. Planned for laparoscopic hernia repair this morning. Appears to have been completed and patient is now in the PACU recovering. OP note indicates surgery was well tolerated and hernia repaired with mesh. No acute complications noted.  -Appreciate Surgery's help -morphine 2 mg q 4 h PRN   Chronic combined systolic and diastolic heart failure  Non ischemic cardiomyopathy  Most recent ECHO 03/2016 shows EF 0000000, grade 2 diastolic dysfunction. 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 Likely secondary to his chemotherapy from non-hodgkin's lymphoma; Adriamycin. Appears euvolemic on exam. Continuing home lasix dose.  - daily weights and strict I&Os  -continue carvedilol 12.5 mg BID, lasix 40 mg BID, Kdur 40 meq daily, sacubitril- valsartan 49-51 mg BID, spironolactone 12.5 mg daily   Hx of Non-Hodgkin Lymphoma: Initially diagnosed  with stage IV non-hodgkin lymphoma and treated in 2002 at [facility] with chemoradiation including Adriamycin. Had recurrence in 2013 but reports being told it was a different cell type. Unable to find any records in care everywhere. Will attempt to get records from [facility].   Pulmonary nodule  Irregular bordered mass like consolidation with calcifications in right lower lobe. Will need follow up imaging with CT with contrast arranged after discharge.  Essential htn  - continue home meds carvedilol 12.5 mg BID, sacubitril- valsartan 49-51 mg BID, spironolactone 12.5 mg daily   Asthma  - continue home med albuterol and dulera BID   Elevated TSH: TSH 7 at 2/13 office visit. Reported being on synthroid in the past at that time (50 mcg). Will re-check TSH with T4 in am.   Dispo: Anticipated discharge in approximately 1-2 day(s).   Maryellen Pile, MD 10/18/2016, 3:21 PM Pager: [phone]

## 2016-10-18 NOTE — Anesthesia Procedure Notes (Signed)
Procedure Name: Intubation Date/Time: 10/18/2016 12:15 PM Performed by: Oletta Lamas Pre-anesthesia Checklist: Patient identified, Emergency Drugs available, Suction available and Patient being monitored Patient Re-evaluated:Patient Re-evaluated prior to inductionOxygen Delivery Method: Circle System Utilized Preoxygenation: Pre-oxygenation with 100% oxygen Intubation Type: IV induction Ventilation: Mask ventilation without difficulty Laryngoscope Size: Mac and 3 Tube type: Oral Tube size: 7.5 mm Number of attempts: 1 Airway Equipment and Method: Stylet Placement Confirmation: ETT inserted through vocal cords under direct vision,  positive ETCO2 and breath sounds checked- equal and bilateral Secured at: 22 cm Tube secured with: Tape Dental Injury: Teeth and Oropharynx as per pre-operative assessment

## 2016-10-18 NOTE — Anesthesia Preprocedure Evaluation (Addendum)
Anesthesia Evaluation  Patient identified by MRN, date of birth, ID band Patient awake    Reviewed: Allergy & Precautions, NPO status , Patient's Chart, lab work & pertinent test results  Airway Mallampati: I  TM Distance: >3 FB Neck ROM: Full    Dental   Pulmonary sleep apnea , former smoker,    Pulmonary exam normal        Cardiovascular hypertension, Pt. on medications +CHF  Normal cardiovascular exam  Non ischemic cardiomyopathy  ECHO 8/17 Study Conclusions  - Left ventricle: The cavity size was normal. There was mild   concentric hypertrophy. Systolic function was severely reduced.   The estimated ejection fraction was in the range of 25% to 30%.   There is akinesis of the anteroseptal and inferoseptal   myocardium. Features are consistent with a pseudonormal left   ventricular filling pattern, with concomitant abnormal relaxation   and increased filling pressure (grade 2 diastolic dysfunction). - Aortic valve: Trileaflet; mildly thickened, mildly calcified   leaflets. There was mild regurgitation. - Aorta: Aortic root dimension: 43 mm (ED). - Ascending aorta: The ascending aorta was mildly dilated. - Mitral valve: There was mild regurgitation. - Pulmonary arteries: Systolic pressure was moderately increased.   PA peak pressure: 47 mm Hg (S).    Neuro/Psych    GI/Hepatic   Endo/Other    Renal/GU      Musculoskeletal   Abdominal   Peds  Hematology   Anesthesia Other Findings   Reproductive/Obstetrics                            Anesthesia Physical Anesthesia Plan  ASA: III  Anesthesia Plan: General   Post-op Pain Management:    Induction: Intravenous  Airway Management Planned: Oral ETT  Additional Equipment:   Intra-op Plan:   Post-operative Plan: Extubation in OR  Informed Consent: I have reviewed the patients History and Physical, chart, labs and discussed the  procedure including the risks, benefits and alternatives for the proposed anesthesia with the patient or authorized representative who has indicated his/her understanding and acceptance.     Plan Discussed with: CRNA and Surgeon  Anesthesia Plan Comments:         Anesthesia Quick Evaluation

## 2016-10-18 NOTE — Consult Note (Signed)
Reason for Consult:incarcerated umbilical hernia Referring Physician: Dr. Algis Greenhouse  Leonard Leonard [name] is an 41 y.o. male.  HPI: This is Leonard [name] 41 year old gentleman who presents with Leonard [name] one-week history of periumbilical abdominal pain. He has multiple comorbidities and has Leonard [name] history of congestive heart failure and COPD. He has had lymphoma treated twice at [facility]. He reports that back in 2002 he had Leonard [name] laparoscopic umbilical hernia repair with mesh done elsewhere. He started having pain of the umbilicus approximately one week ago. He's had no nausea or vomiting. His last bowel movement was yesterday and was normal. He currently has intermittent pain at the umbilicus.  Past Medical History:  Diagnosis Date  . Asthma   . Blood transfusion without reported diagnosis   . CHF (congestive heart failure) ([place]) 04/05/2016  . COPD (chronic obstructive pulmonary disease) ([place])   . Hypertension   . Lymphoma ([place])    x 2  . Sleep apnea   . Thyroid disease     Past Surgical History:  Procedure Laterality Date  . CARDIAC CATHETERIZATION N/Leonard [name] 04/15/2016   Procedure: Right/Left Heart Cath and Coronary Angiography;  Surgeon: Larey Dresser, MD;  Location: [place] [facility];  Service: Cardiovascular;  Laterality: N/Leonard [name];  . tumor biopsy    . WISDOM TOOTH EXTRACTION      Family History  Problem Relation Age of Onset  . Cancer Mother   . Emphysema Mother   . Bronchitis Mother     Social History:  reports that he quit smoking about 9 years ago. His smoking use included Cigarettes. He has Leonard [name] 12.50 pack-year smoking history. His smokeless tobacco use includes Snuff. He reports that he does not drink alcohol or use drugs.  Allergies: No Known Allergies  Medications: I have reviewed the patient's current medications.  Results for orders placed or performed during the hospital encounter of 10/17/16 (from the past 48 hour(s))  Lipase, blood     Status: None   Collection Time: 10/17/16  5:11 PM  Result  Value Ref Range   Lipase 34 11 - 51 U/L  Comprehensive metabolic panel     Status: Abnormal   Collection Time: 10/17/16  5:11 PM  Result Value Ref Range   Sodium 138 135 - 145 mmol/L   Potassium 3.6 3.5 - 5.1 mmol/L   Chloride 102 101 - 111 mmol/L   CO2 23 22 - 32 mmol/L   Glucose, Bld 104 (H) 65 - 99 mg/dL   BUN 16 6 - 20 mg/dL   Creatinine, Ser 0.96 0.61 - 1.24 mg/dL   Calcium 9.0 8.9 - 10.3 mg/dL   Total Protein 7.7 6.5 - 8.1 g/dL   Albumin 3.3 (L) 3.5 - 5.0 g/dL   AST 23 15 - 41 U/L   ALT 15 (L) 17 - 63 U/L   Alkaline Phosphatase 69 38 - 126 U/L   Total Bilirubin 0.3 0.3 - 1.2 mg/dL   GFR calc non Af Amer >60 >60 mL/min   GFR calc Af Amer >60 >60 mL/min    Comment: (NOTE) The eGFR has been calculated using the CKD EPI equation. This calculation has not been validated in all clinical situations. eGFR's persistently <60 mL/min signify possible Chronic Kidney Disease.    Anion gap 13 5 - 15  CBC     Status: Abnormal   Collection Time: 10/17/16  5:11 PM  Result Value Ref Range   WBC 8.2 4.0 - 10.5 K/uL   RBC 4.12 (L) 4.22 - 5.81 MIL/uL  Hemoglobin 9.0 (L) 13.0 - 17.0 g/dL   HCT 30.1 (L) 39.0 - 52.0 %   MCV 73.1 (L) 78.0 - 100.0 fL   MCH 21.8 (L) 26.0 - 34.0 pg   MCHC 29.9 (L) 30.0 - 36.0 g/dL   RDW 17.3 (H) 11.5 - 15.5 %   Platelets 443 (H) 150 - 400 K/uL  Troponin I     Status: None   Collection Time: 10/17/16  8:37 PM  Result Value Ref Range   Troponin I <0.03 <0.03 ng/mL  Brain natriuretic peptide     Status: Abnormal   Collection Time: 10/17/16  8:37 PM  Result Value Ref Range   B Natriuretic Peptide 561.2 (H) 0.0 - 100.0 pg/mL  Urinalysis, Routine w reflex microscopic     Status: Abnormal   Collection Time: 10/17/16 10:28 PM  Result Value Ref Range   Color, Urine YELLOW YELLOW   APPearance CLEAR CLEAR   Specific Gravity, Urine 1.028 1.005 - 1.030   pH 6.0 5.0 - 8.0   Glucose, UA NEGATIVE NEGATIVE mg/dL   Hgb urine dipstick NEGATIVE NEGATIVE   Bilirubin  Urine NEGATIVE NEGATIVE   Ketones, ur NEGATIVE NEGATIVE mg/dL   Protein, ur 30 (Leonard [name]) NEGATIVE mg/dL   Nitrite NEGATIVE NEGATIVE   Leukocytes, UA NEGATIVE NEGATIVE   RBC / HPF 0-5 0 - 5 RBC/hpf   WBC, UA 0-5 0 - 5 WBC/hpf   Bacteria, UA NONE SEEN NONE SEEN   Squamous Epithelial / LPF NONE SEEN NONE SEEN   Mucous PRESENT     Dg Chest 2 View  Result Date: 10/17/2016 CLINICAL DATA:  41 year old male with shortness of breath. History of CHF. EXAM: CHEST  2 VIEW COMPARISON:  Chest radiograph dated 06/08/2016 FINDINGS: Focal area of hazy nodularity in the right lower lobe appears new since the prior radiograph and may represent atelectasis/ scarring or developing infiltrate. Clinical correlation is recommended. Stable left apical pleuroparenchymal density again noted. There is no pleural effusion or pneumothorax. The cardiac silhouette is within normal limits. No acute osseous pathology. IMPRESSION: 1. Focal hazy density in the right lower lobe, new since prior radiograph. This may represent developing infiltrate. Clinical correlation and follow-up recommended. 2. Left apical pleuroparenchymal scarring, stable. Electronically Signed   By: Anner Crete M.D.   On: 10/17/2016 21:49   Ct Abdomen Pelvis W Contrast  Result Date: 10/17/2016 CLINICAL DATA:  Umbilical pain.  History of abdominal hernia repair. EXAM: CT ABDOMEN AND PELVIS WITH CONTRAST TECHNIQUE: Multidetector CT imaging of the abdomen and pelvis was performed using the standard protocol following bolus administration of intravenous contrast. CONTRAST:  117m ISOVUE-300 IOPAMIDOL (ISOVUE-300) INJECTION 61% COMPARISON:  Chest CT 10/09/2015 FINDINGS: Lower chest: Somewhat elliptical masslike consolidation with irregular borders and Leonard [name] few punctate internal calcifications over the right lower lobe new compared to the previous exam. No effusion. Hepatobiliary: Couple tiny subcentimeter hypodensities within the liver too small to characterize but  likely cysts. Gallbladder and biliary tree are normal. Pancreas: Within normal. Spleen: Within normal. Adrenals/Urinary Tract: Adrenal glands are normal. Kidneys are normal in size without hydronephrosis or nephrolithiasis. Ureters and bladder are normal. Stomach/Bowel: Stomach is normal. There is Leonard [name] small umbilical hernia containing Leonard [name] short segment of small bowel as the bowel loop just proximal to the hernia is dilated and fluid-filled measuring 3.5 cm in diameter. Appendix is normal. Colon is normal. Vascular/Lymphatic: Within normal. Reproductive: Within normal. Other: No free fluid or focal inflammatory change. No free peritoneal air. Musculoskeletal: Unremarkable. IMPRESSION: Small umbilical  hernia containing Leonard [name] short segment of small bowel as the small bowel proximal to the hernia is fluid-filled and dilated measuring 3.5 cm in diameter. This is likely early/ partial obstruction due to incarceration in this small umbilical hernia. Irregular bordered masslike consolidation containing punctate calcifications within the right lower lobe. Appearance is concerning for Leonard [name] neoplastic process as cannot exclude lymphomatous involvement in this patient with Leonard [name] history of non-Hodgkin's lymphoma. Less likely this could be due to an infectious/inflammatory process. Recommend contrast-enhanced chest CT for complete evaluation of the thorax. Couple subcentimeter liver hypodensities too small to characterize but likely cysts. These results were called by telephone at the time of interpretation on 10/17/2016 at 10:28 pm to Dr. Theotis Burrow , who verbally acknowledged these results. Electronically Signed   By: Marin Olp M.D.   On: 10/17/2016 22:22    Review of Systems  Constitutional: Negative for chills and fever.  Respiratory: Negative for cough and shortness of breath.   Gastrointestinal: Positive for abdominal pain. Negative for nausea and vomiting.  All other systems reviewed and are negative.  Blood pressure  150/94, pulse 105, temperature 98.4 F (36.9 C), temperature source Oral, resp. rate 22, height 6' (1.829 m), weight 109.8 kg (242 lb), SpO2 100 %. Physical Exam  Constitutional: He is oriented to person, place, and time. He appears well-developed and well-nourished. No distress.  HENT:  Head: Normocephalic and atraumatic.  Right Ear: External ear normal.  Left Ear: External ear normal.  Nose: Nose normal.  Mouth/Throat: Oropharynx is clear and moist. No oropharyngeal exudate.  Eyes: Pupils are equal, round, and reactive to light. Right eye exhibits no discharge. Left eye exhibits no discharge. No scleral icterus.  Neck: Normal range of motion. No tracheal deviation present.  Cardiovascular: Normal rate, regular rhythm, normal heart sounds and intact distal pulses.   No murmur heard. Respiratory: Effort normal and breath sounds normal. No respiratory distress.  GI: Soft. He exhibits no distension. There is tenderness. There is guarding.  His abdomen is focally tender at the umbilicus. He is moderately tender right in the distal the umbilicus. It is difficult to actually feel the incarcerated hernia. The rest of his abdomen is completely soft and nontender. There is no distention.  Musculoskeletal: Normal range of motion. He exhibits no edema.  Lymphadenopathy:    He has no cervical adenopathy.  Neurological: He is alert and oriented to person, place, and time.  Skin: Skin is warm and dry. He is not diaphoretic. No erythema.  Psychiatric: His behavior is normal. Judgment normal.    Assessment/Plan: Incarcerated in the hernia  I reviewed the CT scan. He deathly has Leonard [name] partial obstruction from the hernia that he is not acutely ill and there does not appear to be ischemia. Nonetheless, he will need repair later today. He is being admitted by medicine. He may need cardiac clearance for the general anesthesia. I will discuss this with partners as to whether this be attempted open or laparoscopic  given his previous mesh. For now, he needs to NPO for planned surgery  Leonard Leonard [name] 10/18/2016, 1:18 AM

## 2016-10-18 NOTE — ED Notes (Signed)
Admitting MD at the bedside.  

## 2016-10-18 NOTE — ED Notes (Signed)
Dr. Blackman at the bedside.  

## 2016-10-18 NOTE — Anesthesia Postprocedure Evaluation (Signed)
Anesthesia Post Note  Patient: Leonard [name]  Procedure(s) Performed: Procedure(s) (LRB): LAPAROSCOPIC REPAIR OF UMBILICAL HERNIA  WITH MESH AND LYSIS OF ADHESIONS. (N/A)  Patient location during evaluation: PACU Anesthesia Type: General Level of consciousness: awake and alert Pain management: pain level controlled Vital Signs Assessment: post-procedure vital signs reviewed and stable Respiratory status: spontaneous breathing, nonlabored ventilation, respiratory function stable and patient connected to nasal cannula oxygen Cardiovascular status: blood pressure returned to baseline and stable Postop Assessment: no signs of nausea or vomiting Anesthetic complications: no       Last Vitals:  Vitals:   10/18/16 1534 10/18/16 1543  BP: 109/80   Pulse: 89 87  Resp: 16 14  Temp:      Last Pain:  Vitals:   10/18/16 1733  TempSrc:   PainSc: 2                  Andray Assefa DAVID

## 2016-10-18 NOTE — Op Note (Signed)
Preoperative diagnosis: incisional hernia with concern for obstruction  Postoperative diagnosis: Same   Procedure: laparoscopic incisional hernia repair with mesh  Surgeon: Gurney Maxin, M.D.  Asst: none  Anesthesia: Gen.   Indications for procedure: Leonard [name] is a 41 y.o. male with symptoms of abdominal pain and hernia incarcerated on exam. He had severe abdominal pain and dilated loop of bowel in proximity to an apparent recurrent hernia.  Description of procedure: The patient was brought into the operative suite, placed supine. Anesthesia was administered with endotracheal tube. Patient was strapped in place and foot board was secured. Both arms were tucked. All pressure points were offloaded by foam padding. Foley was placed in sterile fashion. The patient was prepped and draped in the usual sterile fashion.  A small incision was made over the left subcostal area and 36mm trocar was placed with optical entry. Pneumoperitoneum was applied with high flow low pressure. The abdominal cavity was inspected and showed multiple adhesions to the abdominal wall. The mesh was visualized in the lower abdomen. 1 48mm trocar was placed in the mid left abdomen and 3 additional 93mm trocars 1 in the LLQ. All trocar sites were first anesthestized with 0.25% marcaine with epi and trocars were placed under direct vision.  Blunt dissection was used to remove the adhesions with a moderate sharp dissection. 2 loops of small bowel came up to the mesh at 2 points. Extra care was taken to not injure the bowel. After complete adhesiolysis small bowel was inspected and appeared intact. Cautery was used to provide hemostasis. The mesh appeared to go up into the hernia at one point where bowel was adhered to the hernia and the mesh had retracted away from the superior portion of the abdominal wall. The defect was about 4 cm x 3cm.  2 0 novofil interrupted sutures were used to appose the fascial edges and A 15 x 20cm  ventralight ST mesh was inserted and used to the repair the mesh. 8 transfascial 0 novofil sutures were used to secure the mesh in place and absorbable tackers were used to appose the mesh against the abdominal wall in all areas.  The abdominal contents were again inspected and hemostasis was intact.  0 vicryl was used to close the fascial defect of the 16mm trocar site using suture passer. Pneumoperitoneum was removed, all trocar were removed. All incisions were closed with 4-0 monocryl subcuticular stitch. The patient woke from anesthesia and was brought to PACU in stable condition.  Findings: dense adhesions of the omentum and small bowel to the abdominal wall. Mesh retracted away from the superior portion of the abdomen with small bowel adhesed to the mesh up in the hernia sac.  Specimen: none  Blood loss: 30 ml  Local anesthesia: 68ml [id number] marcaine  Complications: none  Implant: 15x20cm ventralight ST mesh  Gurney Maxin, M.D. General, Bariatric, & Minimally Invasive Surgery [facility]

## 2016-10-18 NOTE — Progress Notes (Signed)
Cuff size changed & moved to R forearm

## 2016-10-18 NOTE — Progress Notes (Signed)
Pt new admit alert and oriented able to walk, for urine drug screen, no urine output at this time, he said he will try again, he just urinate in the ED, pt NPO possible OR in the afternoon, instructed and understood, no complain of pain, also held heparin at 0600.

## 2016-10-18 NOTE — Progress Notes (Signed)
Pre Procedure note for inpatients:   Leonard [name] has been scheduled for Procedure(s): LAPAROSCOPIC ASSISTED UMBILICAL HERNIA REPAIR (N/A) today. The various methods of treatment have been discussed with the patient. After consideration of the risks, benefits and treatment options the patient has consented to the planned procedure.   The patient has been seen and labs reviewed. There are no changes in the patient's condition to prevent proceeding with the planned procedure today.  Recent labs:  Lab Results  Component Value Date   WBC 6.2 10/18/2016   HGB 9.0 (L) 10/18/2016   HCT 30.3 (L) 10/18/2016   PLT 444 (H) 10/18/2016   GLUCOSE 91 10/18/2016   CHOL 131 04/06/2016   TRIG 127 04/06/2016   HDL 24 (L) 04/06/2016   LDLCALC 82 04/06/2016   ALT 15 (L) 10/17/2016   AST 23 10/17/2016   NA 136 10/18/2016   K 3.7 10/18/2016   CL 101 10/18/2016   CREATININE 0.88 10/18/2016   BUN 12 10/18/2016   CO2 23 10/18/2016   TSH 7.170 (H) 10/06/2016   INR 1.04 04/15/2016   HGBA1C 5.8 (H) 10/09/2015    [name], MD 10/18/2016 11:33 AM

## 2016-10-18 NOTE — Transfer of Care (Signed)
Immediate Anesthesia Transfer of Care Note  Patient: Leonard [name]  Procedure(s) Performed: Procedure(s): LAPAROSCOPIC REPAIR OF UMBILICAL HERNIA  WITH MESH AND LYSIS OF ADHESIONS. (N/A)  Patient Location: PACU  Anesthesia Type:General  Level of Consciousness: awake, alert , oriented and patient cooperative  Airway & Oxygen Therapy: Patient Spontanous Breathing and Patient connected to nasal cannula oxygen  Post-op Assessment: Report given to RN and Post -op Vital signs reviewed and stable  Post vital signs: Reviewed and stable  Last Vitals:  Vitals:   10/18/16 0230 10/18/16 0404  BP: 140/86 (!) 146/94  Pulse: 103 99  Resp:  17  Temp:  36.6 C    Last Pain:  Vitals:   10/18/16 1055  TempSrc:   PainSc: 0-No pain      Patients Stated Pain Goal: 1 ([id number])  Complications: No apparent anesthesia complications   Medicated for abdominal incisional pain.  Denies any SOB or other discomforts at present.

## 2016-10-18 NOTE — H&P (Signed)
Date: 10/18/2016               Patient Name:  Leonard [name] MRN: [id number]  DOB: 14-Dec-1975 Age / Sex: 41 y.o., male   PCP: Alphonzo Grieve, MD         Medical Service: Internal Medicine Teaching Service         Attending Physician: Dr. Annia Belt, MD    First Contact: Dr. Philipp Ovens  Pager: [phone]  Second Contact: Dr. Charlynn Grimes  Pager: [phone]       After Hours (After 5p/  First Contact Pager: [phone]  weekends / holidays): Second Contact Pager: [phone]   Chief Complaint: abdominal pain   History of Present Illness: Mr. Awais Cobarrubias is a 41 y.o. 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 The pain is over the area of his umbilicus, it is constant at 5/10 severity and cramping. About every 30 minutes the pain becomes worse to a 10/10 severity which last for 30 sec. The pain was worsened when he drank soda but it is better when he lays down on his stomach or in the fetal position. He has had less frequent bowel movements with the last being 1 day ago, usually he has about 2-3 bowel movements per day. He denies nausea, vomiting, or changes in appetite. He has not performed any heavy lifting.  He denies difficulty breathing or leg swelling, he reports good compliance with his home dose of lasix.   Meds:  Current Meds  Medication Sig  . acetaminophen (TYLENOL) 500 MG tablet Take 1 tablet (500 mg total) by mouth every 6 (six) hours as needed. (Patient taking differently: Take 500 mg by mouth every 6 (six) hours as needed for moderate pain. )  . albuterol (PROVENTIL HFA;VENTOLIN HFA) 108 (90 Base) MCG/ACT inhaler Inhale 2 puffs into the lungs every 6 (six) hours as needed for wheezing or shortness of breath.  Marland Kitchen albuterol (PROVENTIL) (2.5 MG/3ML) 0.083%  nebulizer solution Take 3 mLs (2.5 mg total) by nebulization every 6 (six) hours as needed for wheezing or shortness of breath.  . carvedilol (COREG) 12.5 MG tablet Take 1 tablet (12.5 mg total) by mouth 2 (two) times daily with a meal.  . fluticasone (FLONASE) 50 MCG/ACT nasal spray Place 2 sprays into both nostrils daily.  . Fluticasone-Salmeterol (ADVAIR) 250-50 MCG/DOSE AEPB Inhale 2 puffs into the lungs 2 (two) times daily.  . furosemide (LASIX) 40 MG tablet Take 1 tablet (40 mg total) by mouth 2 (two) times daily.  Marland Kitchen gabapentin (NEURONTIN) 800 MG tablet Take 1 tablet (800 mg total) by mouth 3 (three) times daily.  Marland Kitchen guaiFENesin-dextromethorphan (ROBITUSSIN DM) 100-10 MG/5ML syrup Take 5 mLs by mouth every 4 (four) hours as needed for cough.  . Multiple Vitamins-Minerals (MULTIVITAMIN WITH MINERALS) tablet Take 1 tablet by mouth daily.  . pantoprazole (PROTONIX) 40 MG tablet Take 1 tablet (40 mg total) by mouth daily.  . potassium chloride SA (K-DUR,KLOR-CON) 20 MEQ tablet Take 40 mEq by mouth daily.  . sacubitril-valsartan (ENTRESTO) 49-51 MG Take 1 tablet by mouth 2 (two) times daily.  Marland Kitchen spironolactone (ALDACTONE) 25 MG tablet TAKE 1/2 TABLET BY MOUTH EACH DAY   Allergies: Allergies as of 10/17/2016  . (No Known Allergies)   Past Medical History:  Diagnosis Date  .  Asthma   . Blood transfusion without reported diagnosis   . CHF (congestive heart failure) ([place]) 04/05/2016  . COPD (chronic obstructive pulmonary disease) ([place])   . Hypertension   . Lymphoma ([place])    x 2  . Sleep apnea   . Thyroid disease    Family History:  Family History  Problem Relation Age of Onset  . Cancer Mother   . Emphysema Mother   . Bronchitis Mother    Social History:  Social History   Social History  . Marital status: Single    Spouse name: N/A  . Number of children: N/A  . Years of education: N/A   Social History Main Topics  . Smoking status: Former Smoker    Packs/day: 1.25    Years:  10.00    Types: Cigarettes    Quit date: 04/08/2007  . Smokeless tobacco: Current User    Types: Snuff  . Alcohol use No  . Drug use: No  . Sexual activity: Yes   Other Topics Concern  . None   Social History Narrative  . None  Reports using 1/2 -3/4 tin of tobacco per day and current cocaine use.  Review of Systems: A complete ROS was negative except as per HPI.   Physical Exam: Blood pressure 150/94, pulse 105, temperature 98.4 F (36.9 C), temperature source Oral, resp. rate 22, height 6' (1.829 m), weight 242 lb (109.8 kg), SpO2 100 %. Physical Exam  Constitutional: He appears well-developed and well-nourished. No distress.  HENT:  Head: Normocephalic and atraumatic.  Eyes: Conjunctivae are normal. No scleral icterus.  Cardiovascular: Normal rate and regular rhythm.   No murmur heard. Pulmonary/Chest: Effort normal and breath sounds normal. No respiratory distress. He has no wheezes. He has no rales.  Abdominal: Soft. Bowel sounds are normal. He exhibits no distension and no mass. There is no guarding.  Periumbilical tenderness  Neurological: He is alert.  Skin: Skin is warm and dry. He is not diaphoretic.  Psychiatric: He has a normal mood and affect. His behavior is normal.   QKM:[id number] review of the chest xray reveals left upper lobe scaring, right lower lobe infiltrate  CT abdomen and pelvis: personal review of the image reveals small fluid filled and dilated umbilical hernia, right lower lobe lung nodule, and liver hypodensities.   Labs Na 138, K 3.6, CO2 23, BUN 14, Crt 0.96, Glucose 104  Alk phos 69, Lipase 34, AST 23, ALT 15, t bili 0.3 WBC 8.2, Hgb 9.0 (b/l 11.5), Plt 443  BMP 561  Trop <0.03  Urinalysis protein 30, RBC 0-5, nitrite neg   Assessment & Plan by Problem: Principal Problem:   Incarcerated hernia Active Problems:   Essential hypertension   Chronic combined systolic and diastolic congestive heart failure (HCC)   History of non-Hodgkin's  lymphoma   History of substance abuse   Right lower lobe lung mass  41 y.o. male with a PMH of non hogkin lymphoma stage II, ventral abdominal hernia s/p mesh repair in 2012, hypertension, chronic combined systolic and diastolic heart failure who presents with cramping abdominal pain for 1 week. In the ED vitals were temp 98.4, HR 114, BP 146/95, SpO2 99% on RA. Labs were negative for leukocytosis. CT abdomen revealed incarcerated umbilical hernia.   Incarcerated hernia  NPO for now while awaiting surgery evaluation. He has had decreased frequency of bowel movements concerning for possibility of partial obstruction.  - general surgery has been consulted, we appreciate their recommendations  -morphine 2  mg q 4 h PRN   Chronic combined systolic and diastolic heart failure  Non ischemic cardiomyopathy  Echo 03/2916 EF 70-17%, grade 2 diastolic dysfunction. BNP 561 on admission and he was given IV lasix 53m. He appears euvolemic on exam and denies symptoms of leg swelling, orthopnea, or dyspnea.  - daily weights and strict I&Os  -continue carvedilol 12.5 mg BID, lasix 40 mg BID, Kdur 40 meq daily, sacubitril- valsartan 49-51 mg BID, spironolactone 12.5 mg daily   Pulmonary nodule  Irregular bordered mass like consolidation with calcifications in right lower lobe. Will need follow up imaging with CT with contrast arranged after discharge.  Essential htn  - continue home meds carvedilol 12.5 mg BID, sacubitril- valsartan 49-51 mg BID, spironolactone 12.5 mg daily   Asthma  - continue home med albuterol and dulera BID   N NPO  DVT Ppx heparin  Code Status FULL   Dispo: Admit patient to Inpatient with expected length of stay greater than 2 midnights.  Signed: NLedell Noss MD 10/18/2016, 2:19 AM  Pager: [phone]

## 2016-10-19 ENCOUNTER — Encounter (HOSPITAL_COMMUNITY): Payer: Self-pay | Admitting: General Surgery

## 2016-10-19 ENCOUNTER — Telehealth: Payer: Self-pay

## 2016-10-19 ENCOUNTER — Inpatient Hospital Stay (HOSPITAL_COMMUNITY): Admission: RE | Admit: 2016-10-19 | Payer: Medicaid Other | Source: Ambulatory Visit

## 2016-10-19 ENCOUNTER — Inpatient Hospital Stay (HOSPITAL_COMMUNITY): Payer: Medicaid Other

## 2016-10-19 DIAGNOSIS — I5042 Chronic combined systolic (congestive) and diastolic (congestive) heart failure: Secondary | ICD-10-CM

## 2016-10-19 DIAGNOSIS — Z923 Personal history of irradiation: Secondary | ICD-10-CM

## 2016-10-19 DIAGNOSIS — R911 Solitary pulmonary nodule: Secondary | ICD-10-CM

## 2016-10-19 DIAGNOSIS — Z9221 Personal history of antineoplastic chemotherapy: Secondary | ICD-10-CM

## 2016-10-19 DIAGNOSIS — K420 Umbilical hernia with obstruction, without gangrene: Secondary | ICD-10-CM

## 2016-10-19 DIAGNOSIS — I429 Cardiomyopathy, unspecified: Secondary | ICD-10-CM

## 2016-10-19 DIAGNOSIS — C8590 Non-Hodgkin lymphoma, unspecified, unspecified site: Secondary | ICD-10-CM

## 2016-10-19 DIAGNOSIS — F1410 Cocaine abuse, uncomplicated: Secondary | ICD-10-CM

## 2016-10-19 DIAGNOSIS — I428 Other cardiomyopathies: Secondary | ICD-10-CM

## 2016-10-19 DIAGNOSIS — Z9889 Other specified postprocedural states: Secondary | ICD-10-CM

## 2016-10-19 LAB — BASIC METABOLIC PANEL
Anion gap: 13 (ref 5–15)
BUN: 13 mg/dL (ref 6–20)
CHLORIDE: 99 mmol/L — AB (ref 101–111)
CO2: 23 mmol/L (ref 22–32)
CREATININE: 1.05 mg/dL (ref 0.61–1.24)
Calcium: 9.5 mg/dL (ref 8.9–10.3)
GFR calc Af Amer: >60 mL/min (ref >60)
GFR calc non Af Amer: >60 mL/min (ref >60)
GLUCOSE: 145 mg/dL — AB (ref 65–99)
POTASSIUM: 4 mmol/L (ref 3.5–5.1)
Sodium: 135 mmol/L (ref 135–145)

## 2016-10-19 LAB — ECHOCARDIOGRAM COMPLETE
Height: 72 in
Weight: 3686.09 oz

## 2016-10-19 LAB — T4, FREE: Free T4: 0.72 ng/dL (ref 0.61–1.12)

## 2016-10-19 LAB — TSH: TSH: 4.243 u[IU]/mL (ref 0.350–4.500)

## 2016-10-19 MED ORDER — PERFLUTREN LIPID MICROSPHERE
1.0000 mL | INTRAVENOUS | Status: DC | PRN
  Administered 2016-10-19: 3 mL via INTRAVENOUS
  Filled 2016-10-19: qty 10

## 2016-10-19 MED ORDER — MUPIROCIN 2 % EX OINT
1.0000 "application " | TOPICAL_OINTMENT | Freq: Two times a day (BID) | CUTANEOUS | Status: DC
  Administered 2016-10-19: 1 via NASAL
  Filled 2016-10-19: qty 22

## 2016-10-19 MED ORDER — OXYCODONE HCL 5 MG PO TABS
5.0000 mg | ORAL_TABLET | ORAL | Status: DC | PRN
  Administered 2016-10-19: 5 mg via ORAL
  Filled 2016-10-19: qty 1

## 2016-10-19 MED ORDER — CHLORHEXIDINE GLUCONATE CLOTH 2 % EX PADS: 6.0000 | MEDICATED_PAD | Freq: Every day | CUTANEOUS | Status: DC

## 2016-10-19 MED ORDER — HYDROCODONE-ACETAMINOPHEN 5-325 MG PO TABS: 1.0000 | ORAL_TABLET | Freq: Four times a day (QID) | ORAL | 0 refills | Status: DC | PRN

## 2016-10-19 NOTE — Progress Notes (Signed)
   Subjective: Patient is feeling well this morning. Reports "sneaking" [employer] last night for dinner. He tolerated it well without N/V. Reports he still has not had a BM. Not passing flatus yet. Denies pain.  Objective:  Vital signs in last 24 hours: Vitals:   10/18/16 1543 10/18/16 2119 10/19/16 0446 10/19/16 0500  BP:  108/68 (!) 103/50   Pulse: 87 86 89   Resp: 14 16 16    Temp:  97.8 F (36.6 C) 98.1 F (36.7 C)   TempSrc:  Oral Oral   SpO2: 98% 96% 99%   Weight:    230 lb 6.1 oz (104.5 kg)  Height:       Physical Exam Constitutional: NAD, appears comfortable Cardiovascular: RRR, no murmurs, rubs, or gallops.  Pulmonary/Chest: CTAB, no wheezes, rales, or rhonchi. No chest wall abnormalities.  Abdominal: Soft, non tender, non distended. +BS. Abdominal binder in place, well wrapped.  Extremities: Warm and well perfused. Distal pulses intact. No edema.  Neurological: A&Ox3, CN II - XII grossly intact.   Assessment/Plan:  Patient is a 41 yo M with pmhx significant for HTN, combined systolic and diastolic HF, and non-hodgkin's lymphoma stage II who presented with abdominal pain x 1 week, found to have incarcerated umbilical hernia on CT abdomen/pelvis now POD1 from repair.   Incarcerated hernia: POD 1 from hernia repair via general surgery. Patient is doing well this morning, ate taco bell last night for dinner. Denies abdominal pain or N/V. Has not yet had a BM but has good bowel sounds on exam.  -- Surgery following, appreciate recommendations  -- Encouraged ambulation  -- PRN oxycodone 5 mg q4 prn for pain -- Discontinue morphine   Chronic combined systolic and diastolic heart failure: Secondary to non ischemic cardiomyopathy. Most recent ECHO 03/2016 shows EF 0000000, grade 2 diastolic dysfunction. 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 Likely secondary to his chemotherapy from non-hodgkin's lymphoma; Adriamycin.  Appears euvolemic on exam. Continuing home lasix dose.  -- Daily weights and strict I&Os  -- Continue carvedilol 12.5 mg BID, lasix 40 mg BID, Kdur 40 meq daily, sacubitril- valsartan 49-51 mg BID, spironolactone 12.5 mg daily   Hx of Non-Hodgkin Lymphoma: Initially diagnosed with stage IV non-hodgkin lymphoma and treated in 2002 at [facility] with chemoradiation including Adriamycin. Had recurrence in 2013 but reports being told it was a different cell type. Unable to find any records in care everywhere. Will attempt to get records from [facility].   Pulmonary nodule: Irregular bordered mass like consolidation with calcifications in right lower lobe. Will need follow up imaging with CT with contrast arranged after discharge.  Essential HTN -- Continue home meds carvedilol 12.5 mg BID,sacubitril- valsartan 49-51 mg BID, spironolactone 12.5 mg daily   Asthma  -- Continue home med albuterol and dulera BID   Elevated TSH: TSH 7 at 2/13 office visit. Reported being on synthroid in the past at that time (50 mcg).  TSH and T4 are within normal limits this hospitalization.  -- F/u PCP  FEN: No fluids, replete lytes prn, advance diet as tolerated VTE ppx: Sub Q heparin  Code Status: FULL   Dispo: Anticipated discharge in approximately 1-2 day(s).   Velna Ochs, MD 10/19/2016, 7:23 AM Pager: [phone]

## 2016-10-19 NOTE — Discharge Instructions (Signed)
Please arrive at least 30 min before your appointment to complete your check in paperwork.  If you are unable to arrive 30 min prior to your appointment time we may have to cancel or reschedule you.  LAPAROSCOPIC SURGERY: POST OP INSTRUCTIONS  1. DIET: Follow a light bland diet the first 24 hours after arrival home, such as soup, liquids, crackers, etc. Be sure to include lots of fluids daily. Avoid fast food or heavy meals as your are more likely to get nauseated. Eat a low fat the next few days after surgery.  2. Take your usually prescribed home medications unless otherwise directed. 3. PAIN CONTROL:  1. Pain is best controlled by a usual combination of three different methods TOGETHER:  1. Ice/Heat 2. Over the counter pain medication 3. Prescription pain medication 2. Most patients will experience some swelling and bruising around the incisions. Ice packs or heating pads (30-60 minutes up to 6 times a day) will help. Use ice for the first few days to help decrease swelling and bruising, then switch to heat to help relax tight/sore spots and speed recovery. Some people prefer to use ice alone, heat alone, alternating between ice & heat. Experiment to what works for you. Swelling and bruising can take several weeks to resolve.  3. It is helpful to take an over-the-counter pain medication regularly for the first few weeks. Choose one of the following that works best for you:  1. Naproxen (Aleve, etc) Two 220mg  tabs twice a day 2. Ibuprofen (Advil, etc) Three 200mg  tabs four times a day (every meal & bedtime) 3. Acetaminophen (Tylenol, etc) 500-650mg  four times a day (every meal & bedtime) 4. A prescription for pain medication (such as oxycodone, hydrocodone, etc) should be given to you upon discharge. Take your pain medication as prescribed.  1. If you are having problems/concerns with the prescription medicine (does not control pain, nausea, vomiting, rash, itching, etc), please call us [phone]  [phone] to see if we need to switch you to a different pain medicine that will work better for you and/or control your side effect better. 2. If you need a refill on your pain medication, please contact your pharmacy. They will contact our office to request authorization. Prescriptions will not be filled after 5 pm or on week-ends. 4. Avoid getting constipated. Between the surgery and the pain medications, it is common to experience some constipation. Increasing fluid intake and taking a fiber supplement (such as Metamucil, Citrucel, FiberCon, MiraLax, etc) 1-2 times a day regularly will usually help prevent this problem from occurring. A mild laxative (prune juice, Milk of Magnesia, MiraLax, etc) should be taken according to package directions if there are no bowel movements after 48 hours.  5. Watch out for diarrhea. If you have many loose bowel movements, simplify your diet to bland foods & liquids for a few days. Stop any stool softeners and decrease your fiber supplement. Switching to mild anti-diarrheal medications (Kayopectate, Pepto Bismol) can help. If this worsens or does not improve, please call us. 6. Wash / shower every day. You may shower over the dressings as they are waterproof. Continue to shower over incision(s) after the dressing is off. 7. Remove your waterproof bandages 5 days after surgery. You may leave the incision open to air. You may replace a dressing/Band-Aid to cover the incision for comfort if you wish.  8. ACTIVITIES as tolerated:  1. NO LIFTING >15lbs for 6 weeks 2. You may resume regular (light) daily activities beginning the next  day--such as daily self-care, walking, climbing stairs--gradually increasing activities as tolerated. If you can walk 30 minutes without difficulty, it is safe to try more intense activity such as jogging, treadmill, bicycling, low-impact aerobics, swimming, etc. 3. No strenuous activity such as sit-ups, heavy lifting, contact sports, etc for 6+  weeks.  4. DO NOT PUSH THROUGH PAIN. Let pain be your guide: If it hurts to do something, don't do it. Pain is your body warning you to avoid that activity for another week until the pain goes down. 5. You may drive when you are no longer taking prescription pain medication, you can comfortably wear a seatbelt, and you can safely maneuver your car and apply brakes. 6. You may have sexual intercourse when it is comfortable.  9. FOLLOW UP in our office  1. Please call [facility] at [phone] to set up an appointment to see your surgeon in the office for a follow-up appointment approximately 2-3 weeks after your surgery. 2. Make sure that you call for this appointment the day you arrive home to insure a convenient appointment time.      10. IF YOU HAVE DISABILITY OR FAMILY LEAVE FORMS, BRING THEM TO THE               OFFICE FOR PROCESSING.   WHEN TO CALL us [phone]:  1. Poor pain control 2. Reactions / problems with new medications (rash/itching, nausea, etc)  3. Fever over 101.5 F (38.5 C) 4. Inability to urinate 5. Nausea and/or vomiting 6. Worsening swelling or bruising 7. Continued bleeding from incision. 8. Increased pain, redness, or drainage from the incision  The clinic staff is available to answer your questions during regular business hours (8:30am-5pm). Please dont hesitate to call and ask to speak to one of our nurses for clinical concerns.  If you have a medical emergency, go to the nearest emergency room or call 911.  A surgeon from [facility] is always on call at the [facility], [place], [place], [place], [place] [address] ?  MAIN: [phone] ? TOLL FREE: [phone] ?  FAX [phone]  [email or web address]

## 2016-10-19 NOTE — Progress Notes (Signed)
  Echocardiogram 2D Echocardiogram with definity has been performed.  [name] M 10/19/2016, 1:11 PM

## 2016-10-19 NOTE — Discharge Summary (Signed)
Name: Leonard [name] MRN: [id number] DOB: 12-09-1975 41 y.o. PCP: Leonard Grieve, MD  Date of Admission: 10/17/2016  8:12 PM Date of Discharge: 10/19/2016 Attending Physician: Dr. Annia Belt  Discharge Diagnosis: 1. Incarcerated Umbilical Hernia   Discharge Medications: Allergies as of 10/19/2016   No Known Allergies     Medication List    TAKE these medications   acetaminophen 500 MG tablet Commonly known as:  TYLENOL Take 1 tablet (500 mg total) by mouth every 6 (six) hours as needed. What changed:  reasons to take this   albuterol (2.5 MG/3ML) 0.083% nebulizer solution Commonly known as:  PROVENTIL Take 3 mLs (2.5 mg total) by nebulization every 6 (six) hours as needed for wheezing or shortness of breath.   albuterol 108 (90 Base) MCG/ACT inhaler Commonly known as:  PROVENTIL HFA;VENTOLIN HFA Inhale 2 puffs into the lungs every 6 (six) hours as needed for wheezing or shortness of breath.   carvedilol 12.5 MG tablet Commonly known as:  COREG Take 1 tablet (12.5 mg total) by mouth 2 (two) times daily with a meal.   fluticasone 50 MCG/ACT nasal spray Commonly known as:  FLONASE Place 2 sprays into both nostrils daily.   Fluticasone-Salmeterol 250-50 MCG/DOSE Aepb Commonly known as:  ADVAIR Inhale 2 puffs into the lungs 2 (two) times daily.   furosemide 40 MG tablet Commonly known as:  LASIX Take 1 tablet (40 mg total) by mouth 2 (two) times daily.   gabapentin 800 MG tablet Commonly known as:  NEURONTIN Take 1 tablet (800 mg total) by mouth 3 (three) times daily.   guaiFENesin-dextromethorphan 100-10 MG/5ML syrup Commonly known as:  ROBITUSSIN DM Take 5 mLs by mouth every 4 (four) hours as needed for cough.   HYDROcodone-acetaminophen 5-325 MG tablet Commonly known as:  NORCO Take 1 tablet by mouth every 6 (six) hours as needed for moderate pain.   multivitamin with minerals tablet Take 1 tablet by mouth daily.   pantoprazole 40 MG  tablet Commonly known as:  PROTONIX Take 1 tablet (40 mg total) by mouth daily.   potassium chloride SA 20 MEQ tablet Commonly known as:  K-DUR,KLOR-CON Take 40 mEq by mouth daily.   sacubitril-valsartan 49-51 MG Commonly known as:  ENTRESTO Take 1 tablet by mouth 2 (two) times daily.   spironolactone 25 MG tablet Commonly known as:  ALDACTONE TAKE 1/2 TABLET BY MOUTH EACH DAY       Disposition and follow-up:   Mr.Leonard [name] was discharged from [facility] in Stable condition.  At the hospital follow up visit please address:  1.  Incarcerated Hernia: S/p mesh repair by Dr. Kieth Brightly on 10/18/16. Patient should follow up with surgery in 2 weeks. Please ensure he has appointment scheduled.   2.  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 Repeat echocardiogram this admission showed improved systolic function, EF [id number] up from 25-30% in August 2017. Diffuse hypokinesis. UDS was positive for cocaine. Please encourage cessation.   3. Pulmonary Nodule: RLL concerning for a neoplastic process given his history of non-hodgkin's lymphoma. Will need follow up CT with contrast.   4.  Labs / imaging needed at time of follow-up: CT Chest with contrast (pulm nodule)   5.  Pending labs/ test needing follow-up: None   Follow-up Appointments: Follow-up Information    Leonard Bruce Kinsinger, MD. Schedule an appointment as soon as possible for a visit in 2 week(s).   Specialty:  General Surgery Contact information: [address]  [address] [place] [address] [phone]        Leonard Grieve, MD. Go on 10/26/2016.   Specialty:  Internal Medicine Why:  at 9:45am for hospital follow up. Please arrive 15 minutes early.  Contact information: [place] [id number] [place] Hospital Course by problem list:  1. Incarcerated Hernia: Patient had a history of a ventral abdominal hernia s/p mesh repair in 2012. He  presented with cramping abdominal pain for 1 week.  In the ED vitals were temp 98.4, HR 114, BP 146/95, SpO2 99% on RA. Labs were negative for leukocytosis. CT abdomen revealed incarcerated umbilical hernia. General surgery was consulted and he underwent laparoscopic umbilical hernia repair with mesh the following day. The surgery went well without complication. Patient recovered very quickly post operatively. The evening after the surgery he ate taco bell for dinner and [employer] the next morning for breakfast. He tolerated this well without N/V. He was passing gas, ambulating without difficulty, and pain was well controlled on POD 1. He was discharged with plans to follow up with general surgery in 2 weeks, and his PCP office in 1 week.   2. Chronic Combined Systolic and Diastolic Heart Failure: Patient has a history of non ischemic cardiomyopathy felt to be related to his chemotherapy (adriamycin) in 2002, +/- current cocaine use (UDS positive). His last echocardiogram was done in August of 2017 and shoed EF 20-30% with grade 2 diastolic dysfunction. He also had a cardiac cath done at that time that showed minimal stenosis in the proximal LAD 20-30%, but coronaries were otherwise normal. BNP this admission was 561 however he was asymptomatic and appeared euvolemic on exam. His current home regimen was continued (carvedilol 12.5 mg BID, lasix 40 mg BID, Kdur 40 meq daily, sacubitril- valsartan 49-51 mg BID, spironolactone 12.5 mg daily). He remained well compensated. Repeat echocardiogram this admission showed diffuse hypokinesis and improved systolic function, EF [id number].   3. RLL Pulmonary Nodule: Irregular bordered mass like consolidation with calcifications in right lower lobe. Concerning for a neoplastic process given his history of non-hodgkin's lymphoma. Will need follow up imaging with CT with contrast arranged after discharge. Please follow up.   Discharge Vitals:   BP 116/78 (BP Location: Left Arm)    Pulse 98   Temp 98.1 F (36.7 C) (Oral)   Resp 16   Ht 6' (1.829 m)   Wt 230 lb 6.1 oz (104.5 kg)   SpO2 97%   BMI 31.25 kg/m   Pertinent Labs, Studies, and Procedures:   10/17/2016 Chest 2 View: IMPRESSION: 1. Focal hazy density in the right lower lobe, new since prior radiograph. This may represent developing infiltrate. Clinical correlation and follow-up recommended. 2. Left apical pleuroparenchymal scarring, stable.  10/17/2016 CT Abdomen Pelvis W Contrast: IMPRESSION: Small umbilical hernia containing a short segment of small bowel as the small bowel proximal to the hernia is fluid-filled and dilated measuring 3.5 cm in diameter. This is likely early/ partial obstruction due to incarceration in this small umbilical hernia.  Irregular bordered masslike consolidation containing punctate calcifications within the right lower lobe. Appearance is concerning for a neoplastic process as cannot exclude lymphomatous involvement in this patient with a history of non-Hodgkin's lymphoma. Less likely this could be due to an infectious/inflammatory process. Recommend contrast-enhanced chest CT for complete evaluation of the thorax.  Couple subcentimeter liver hypodensities too small to characterize but likely cysts.  10/19/2016 Echocardiogram: Study  Conclusions - Left ventricle: The cavity size was moderately dilated. Wall thickness was normal. Systolic function was mildly to moderately reduced. The estimated ejection fraction was in the range of 40% to 45%. Diffuse hypokinesis. - Aortic valve: There was trivial regurgitation. Valve area (VTI):   2.38 cm^2. Valve area (Vmax): 2.38 cm^2. Valve area (Vmean): 2.43   cm^2. - Atrial septum: No defect or patent foramen ovale was identified.   Discharge Instructions: Discharge Instructions    Call MD for:  persistant nausea and vomiting    Complete by:  As directed    Call MD for:  redness, tenderness, or signs of infection  (pain, swelling, redness, odor or green/yellow discharge around incision site)    Complete by:  As directed    Call MD for:  severe uncontrolled pain    Complete by:  As directed    Call MD for:  temperature >100.4    Complete by:  As directed    Diet - low sodium heart healthy    Complete by:  As directed    Discharge instructions    Complete by:  As directed    Mr. Mitter, it was a pleasure taking care of you. I am glad you are feeling better. Please follow up for your scheduled appointment with Internal Medicine on 10/26/16 at 9:45am. Please arrive 15 minutes early and bring your medications with you. You may continue to take all of your medicines as previously prescribed. If you have any questions or concerns, call our clinic at [phone] or after hours call [phone] and ask for the internal medicine resident on call. Thank you!   Increase activity slowly    Complete by:  As directed       Signed: Velna Ochs, MD 10/19/2016, 5:59 PM   Pager: [phone]

## 2016-10-19 NOTE — Progress Notes (Signed)
[facility] Progress Note  1 Day Post-Op  Subjective: Pt ate taco bell last night and [employer] this morning. No nausea or vomiting. No fever or chills overnight. He is having flatus. No BM. No acute events overnight.   Objective: Vital signs in last 24 hours: Temp:  [97.7 F (36.5 C)-98.1 F (36.7 C)] 98.1 F (36.7 C) (02/26 0446) Pulse Rate:  [86-101] 101 (02/26 0823) Resp:  [14-23] 16 (02/26 0446) BP: (74-118)/(43-80) 116/78 (02/26 0823) SpO2:  [94 %-100 %] 99 % (02/26 0446) Weight:  [230 lb 6.1 oz (104.5 kg)] 230 lb 6.1 oz (104.5 kg) (02/26 0500) Last BM Date: 10/17/16  Intake/Output from previous day: 02/25 0701 - 02/26 0700 In: 2333.8 [P.O.:900; I.V.:1433.8] Out: 1725 [Urine:1700; Blood:25] Intake/Output this shift: No intake/output data recorded.  PE: Gen:  Alert, NAD, pleasant, cooperative, well appearing. Card:  RRR, no M/G/R heard Pulm:  Rate and effort normal Abd: Soft, obese, nondistended, hypoactive BS, incisions C/D/I, generalized appropriate TTP Skin: not diaphoretic, warm and dry  Lab Results:   Recent Labs  10/17/16 1711 10/18/16 0544  WBC 8.2 6.2  HGB 9.0* 9.0*  HCT 30.1* 30.3*  PLT 443* 444*   BMET  Recent Labs  10/18/16 0544 10/19/16 0345  NA 136 135  K 3.7 4.0  CL 101 99*  CO2 23 23  GLUCOSE 91 145*  BUN 12 13  CREATININE 0.88 1.05  CALCIUM 8.7* 9.5   PT/INR No results for input(s): LABPROT, INR in the last 72 hours. CMP     Component Value Date/Time   NA 135 10/19/2016 0345   K 4.0 10/19/2016 0345   CL 99 (L) 10/19/2016 0345   CO2 23 10/19/2016 0345   GLUCOSE 145 (H) 10/19/2016 0345   BUN 13 10/19/2016 0345   CREATININE 1.05 10/19/2016 0345   CALCIUM 9.5 10/19/2016 0345   PROT 7.7 10/17/2016 1711   ALBUMIN 3.3 (L) 10/17/2016 1711   AST 23 10/17/2016 1711   ALT 15 (L) 10/17/2016 1711   ALKPHOS 69 10/17/2016 1711   BILITOT 0.3 10/17/2016 1711   GFRNONAA >60 10/19/2016 0345   GFRAA >60 10/19/2016 0345    Lipase     Component Value Date/Time   LIPASE 34 10/17/2016 1711       Studies/Results: Dg Chest 2 View  Result Date: 10/17/2016 CLINICAL DATA:  41 year old male with shortness of breath. History of CHF. EXAM: CHEST  2 VIEW COMPARISON:  Chest radiograph dated 06/08/2016 FINDINGS: Focal area of hazy nodularity in the right lower lobe appears new since the prior radiograph and may represent atelectasis/ scarring or developing infiltrate. Clinical correlation is recommended. Stable left apical pleuroparenchymal density again noted. There is no pleural effusion or pneumothorax. The cardiac silhouette is within normal limits. No acute osseous pathology. IMPRESSION: 1. Focal hazy density in the right lower lobe, new since prior radiograph. This may represent developing infiltrate. Clinical correlation and follow-up recommended. 2. Left apical pleuroparenchymal scarring, stable. Electronically Signed   By: Anner Crete M.D.   On: 10/17/2016 21:49   Ct Abdomen Pelvis W Contrast  Result Date: 10/17/2016 CLINICAL DATA:  Umbilical pain.  History of abdominal hernia repair. EXAM: CT ABDOMEN AND PELVIS WITH CONTRAST TECHNIQUE: Multidetector CT imaging of the abdomen and pelvis was performed using the standard protocol following bolus administration of intravenous contrast. CONTRAST:  171mL ISOVUE-300 IOPAMIDOL (ISOVUE-300) INJECTION 61% COMPARISON:  Chest CT 10/09/2015 FINDINGS: Lower chest: Somewhat elliptical masslike consolidation with irregular borders and a few punctate internal calcifications  over the right lower lobe new compared to the previous exam. No effusion. Hepatobiliary: Couple tiny subcentimeter hypodensities within the liver too small to characterize but likely cysts. Gallbladder and biliary tree are normal. Pancreas: Within normal. Spleen: Within normal. Adrenals/Urinary Tract: Adrenal glands are normal. Kidneys are normal in size without hydronephrosis or nephrolithiasis. Ureters and  bladder are normal. Stomach/Bowel: Stomach is normal. There is a small umbilical hernia containing a short segment of small bowel as the bowel loop just proximal to the hernia is dilated and fluid-filled measuring 3.5 cm in diameter. Appendix is normal. Colon is normal. Vascular/Lymphatic: Within normal. Reproductive: Within normal. Other: No free fluid or focal inflammatory change. No free peritoneal air. Musculoskeletal: Unremarkable. IMPRESSION: Small umbilical hernia containing a short segment of small bowel as the small bowel proximal to the hernia is fluid-filled and dilated measuring 3.5 cm in diameter. This is likely early/ partial obstruction due to incarceration in this small umbilical hernia. Irregular bordered masslike consolidation containing punctate calcifications within the right lower lobe. Appearance is concerning for a neoplastic process as cannot exclude lymphomatous involvement in this patient with a history of non-Hodgkin's lymphoma. Less likely this could be due to an infectious/inflammatory process. Recommend contrast-enhanced chest CT for complete evaluation of the thorax. Couple subcentimeter liver hypodensities too small to characterize but likely cysts. These results were called by telephone at the time of interpretation on 10/17/2016 at 10:28 pm to Dr. Theotis Burrow , who verbally acknowledged these results. Electronically Signed   By: Marin Olp M.D.   On: 10/17/2016 22:22    Anti-infectives: Anti-infectives    Start     Dose/Rate Route Frequency Ordered Stop   10/18/16 1151  ceFAZolin (ANCEF) 2-4 GM/100ML-% IVPB    Comments:  Scronce, Trina   : cabinet override      10/18/16 1151 10/18/16 2359       Assessment/Plan  Incarcerated umbilical hernia S/P laparoscopic incisional hernia repair with mesh, Dr. Kieth Brightly, 02/25 - pt ate taco bell last night without N or V - + flatus  Plan: pt is tolerating a regular diet. As long as he continues to do so he is clear for  discharge from a surgical standpoint. Encourage ambulation     LOS: 1 day    Kalman Drape , [facility] 10/19/2016, 9:55 AM Pager: [phone] Consults: [phone] Mon-Fri 7:00 am-4:30 pm Sat-Sun 7:00 am-11:30 am

## 2016-10-19 NOTE — Progress Notes (Signed)
Pt was tolerating regular diet. Ambulating to the hallway. Pain is controlled with oral narcotics. Discharge instructions given, verbalized understanding. Discharged to home.

## 2016-10-19 NOTE — Telephone Encounter (Signed)
Hospital TOC per Dr Philipp Ovens, discharge 10/19/2016, appt 10/26/2016.

## 2016-10-21 ENCOUNTER — Encounter (HOSPITAL_COMMUNITY): Payer: Self-pay | Admitting: Emergency Medicine

## 2016-10-21 ENCOUNTER — Emergency Department (HOSPITAL_COMMUNITY): Payer: Medicaid Other

## 2016-10-21 ENCOUNTER — Other Ambulatory Visit (HOSPITAL_COMMUNITY): Payer: Self-pay

## 2016-10-21 ENCOUNTER — Telehealth: Payer: Self-pay | Admitting: *Deleted

## 2016-10-21 ENCOUNTER — Inpatient Hospital Stay (HOSPITAL_COMMUNITY)
Admission: EM | Admit: 2016-10-21 | Discharge: 2016-10-28 | DRG: 602 | Disposition: A | Discharge status: Home / Self Care | Payer: Medicaid Other | Attending: Internal Medicine | Admitting: Internal Medicine

## 2016-10-21 DIAGNOSIS — T451X5A Adverse effect of antineoplastic and immunosuppressive drugs, initial encounter: Secondary | ICD-10-CM | POA: Diagnosis present

## 2016-10-21 DIAGNOSIS — K9189 Other postprocedural complications and disorders of digestive system: Secondary | ICD-10-CM | POA: Diagnosis present

## 2016-10-21 DIAGNOSIS — Z960 Presence of urogenital implants: Secondary | ICD-10-CM | POA: Diagnosis not present

## 2016-10-21 DIAGNOSIS — R918 Other nonspecific abnormal finding of lung field: Secondary | ICD-10-CM | POA: Diagnosis present

## 2016-10-21 DIAGNOSIS — C8590 Non-Hodgkin lymphoma, unspecified, unspecified site: Secondary | ICD-10-CM | POA: Diagnosis present

## 2016-10-21 DIAGNOSIS — D509 Iron deficiency anemia, unspecified: Secondary | ICD-10-CM | POA: Diagnosis present

## 2016-10-21 DIAGNOSIS — Z8572 Personal history of non-Hodgkin lymphomas: Secondary | ICD-10-CM

## 2016-10-21 DIAGNOSIS — Z825 Family history of asthma and other chronic lower respiratory diseases: Secondary | ICD-10-CM

## 2016-10-21 DIAGNOSIS — K567 Ileus, unspecified: Secondary | ICD-10-CM | POA: Diagnosis present

## 2016-10-21 DIAGNOSIS — F1490 Cocaine use, unspecified, uncomplicated: Secondary | ICD-10-CM | POA: Diagnosis not present

## 2016-10-21 DIAGNOSIS — E876 Hypokalemia: Secondary | ICD-10-CM | POA: Diagnosis not present

## 2016-10-21 DIAGNOSIS — R55 Syncope and collapse: Secondary | ICD-10-CM | POA: Diagnosis not present

## 2016-10-21 DIAGNOSIS — B9689 Other specified bacterial agents as the cause of diseases classified elsewhere: Secondary | ICD-10-CM

## 2016-10-21 DIAGNOSIS — E871 Hypo-osmolality and hyponatremia: Secondary | ICD-10-CM | POA: Diagnosis present

## 2016-10-21 DIAGNOSIS — Z923 Personal history of irradiation: Secondary | ICD-10-CM

## 2016-10-21 DIAGNOSIS — E860 Dehydration: Secondary | ICD-10-CM | POA: Diagnosis not present

## 2016-10-21 DIAGNOSIS — Z9889 Other specified postprocedural states: Secondary | ICD-10-CM | POA: Diagnosis not present

## 2016-10-21 DIAGNOSIS — K56609 Unspecified intestinal obstruction, unspecified as to partial versus complete obstruction: Secondary | ICD-10-CM

## 2016-10-21 DIAGNOSIS — M549 Dorsalgia, unspecified: Secondary | ICD-10-CM | POA: Diagnosis present

## 2016-10-21 DIAGNOSIS — I427 Cardiomyopathy due to drug and external agent: Secondary | ICD-10-CM | POA: Diagnosis present

## 2016-10-21 DIAGNOSIS — K5900 Constipation, unspecified: Secondary | ICD-10-CM | POA: Diagnosis not present

## 2016-10-21 DIAGNOSIS — L02211 Cutaneous abscess of abdominal wall: Secondary | ICD-10-CM | POA: Diagnosis present

## 2016-10-21 DIAGNOSIS — R509 Fever, unspecified: Secondary | ICD-10-CM

## 2016-10-21 DIAGNOSIS — I5042 Chronic combined systolic (congestive) and diastolic (congestive) heart failure: Secondary | ICD-10-CM | POA: Diagnosis present

## 2016-10-21 DIAGNOSIS — Y842 Radiological procedure and radiotherapy as the cause of abnormal reaction of the patient, or of later complication, without mention of misadventure at the time of the procedure: Secondary | ICD-10-CM | POA: Diagnosis present

## 2016-10-21 DIAGNOSIS — Z6835 Body mass index (BMI) 35.0-35.9, adult: Secondary | ICD-10-CM | POA: Diagnosis not present

## 2016-10-21 DIAGNOSIS — R109 Unspecified abdominal pain: Secondary | ICD-10-CM

## 2016-10-21 DIAGNOSIS — I110 Hypertensive heart disease with heart failure: Secondary | ICD-10-CM | POA: Diagnosis present

## 2016-10-21 DIAGNOSIS — L03311 Cellulitis of abdominal wall: Secondary | ICD-10-CM

## 2016-10-21 DIAGNOSIS — L0390 Cellulitis, unspecified: Secondary | ICD-10-CM

## 2016-10-21 DIAGNOSIS — J45909 Unspecified asthma, uncomplicated: Secondary | ICD-10-CM | POA: Diagnosis not present

## 2016-10-21 DIAGNOSIS — F17290 Nicotine dependence, other tobacco product, uncomplicated: Secondary | ICD-10-CM | POA: Diagnosis not present

## 2016-10-21 DIAGNOSIS — G8918 Other acute postprocedural pain: Secondary | ICD-10-CM | POA: Diagnosis present

## 2016-10-21 DIAGNOSIS — R000 Tachycardia, unspecified: Secondary | ICD-10-CM | POA: Diagnosis not present

## 2016-10-21 DIAGNOSIS — R339 Retention of urine, unspecified: Secondary | ICD-10-CM | POA: Diagnosis not present

## 2016-10-21 DIAGNOSIS — E861 Hypovolemia: Secondary | ICD-10-CM | POA: Diagnosis not present

## 2016-10-21 DIAGNOSIS — N179 Acute kidney failure, unspecified: Secondary | ICD-10-CM | POA: Diagnosis not present

## 2016-10-21 DIAGNOSIS — G4730 Sleep apnea, unspecified: Secondary | ICD-10-CM | POA: Diagnosis present

## 2016-10-21 DIAGNOSIS — R1084 Generalized abdominal pain: Secondary | ICD-10-CM

## 2016-10-21 DIAGNOSIS — I5043 Acute on chronic combined systolic (congestive) and diastolic (congestive) heart failure: Secondary | ICD-10-CM | POA: Diagnosis present

## 2016-10-21 DIAGNOSIS — Z9221 Personal history of antineoplastic chemotherapy: Secondary | ICD-10-CM

## 2016-10-21 DIAGNOSIS — Z7951 Long term (current) use of inhaled steroids: Secondary | ICD-10-CM | POA: Diagnosis not present

## 2016-10-21 DIAGNOSIS — I5023 Acute on chronic systolic (congestive) heart failure: Secondary | ICD-10-CM | POA: Diagnosis not present

## 2016-10-21 DIAGNOSIS — T8149XA Infection following a procedure, other surgical site, initial encounter: Secondary | ICD-10-CM

## 2016-10-21 DIAGNOSIS — Z720 Tobacco use: Secondary | ICD-10-CM

## 2016-10-21 DIAGNOSIS — Z79899 Other long term (current) drug therapy: Secondary | ICD-10-CM | POA: Diagnosis not present

## 2016-10-21 DIAGNOSIS — B9620 Unspecified Escherichia coli [E. coli] as the cause of diseases classified elsewhere: Secondary | ICD-10-CM | POA: Diagnosis present

## 2016-10-21 DIAGNOSIS — A419 Sepsis, unspecified organism: Secondary | ICD-10-CM

## 2016-10-21 DIAGNOSIS — D72829 Elevated white blood cell count, unspecified: Secondary | ICD-10-CM | POA: Diagnosis not present

## 2016-10-21 DIAGNOSIS — Z9689 Presence of other specified functional implants: Secondary | ICD-10-CM | POA: Diagnosis not present

## 2016-10-21 DIAGNOSIS — I959 Hypotension, unspecified: Secondary | ICD-10-CM | POA: Diagnosis not present

## 2016-10-21 DIAGNOSIS — I428 Other cardiomyopathies: Secondary | ICD-10-CM

## 2016-10-21 DIAGNOSIS — L0291 Cutaneous abscess, unspecified: Secondary | ICD-10-CM

## 2016-10-21 DIAGNOSIS — Z8719 Personal history of other diseases of the digestive system: Secondary | ICD-10-CM

## 2016-10-21 DIAGNOSIS — K909 Intestinal malabsorption, unspecified: Secondary | ICD-10-CM | POA: Diagnosis present

## 2016-10-21 DIAGNOSIS — I509 Heart failure, unspecified: Secondary | ICD-10-CM

## 2016-10-21 LAB — COMPREHENSIVE METABOLIC PANEL
ALBUMIN: 3.1 g/dL — AB (ref 3.5–5.0)
ALT: 13 U/L — ABNORMAL LOW (ref 17–63)
ANION GAP: 10 (ref 5–15)
AST: 17 U/L (ref 15–41)
Alkaline Phosphatase: 63 U/L (ref 38–126)
BUN: 14 mg/dL (ref 6–20)
CO2: 25 mmol/L (ref 22–32)
Calcium: 8.9 mg/dL (ref 8.9–10.3)
Chloride: 101 mmol/L (ref 101–111)
Creatinine, Ser: 0.95 mg/dL (ref 0.61–1.24)
GFR calc non Af Amer: >60 mL/min (ref >60)
GLUCOSE: 108 mg/dL — AB (ref 65–99)
POTASSIUM: 4.3 mmol/L (ref 3.5–5.1)
SODIUM: 136 mmol/L (ref 135–145)
Total Bilirubin: 0.5 mg/dL (ref 0.3–1.2)
Total Protein: 7.5 g/dL (ref 6.5–8.1)

## 2016-10-21 LAB — CBC WITH DIFFERENTIAL/PLATELET
BASOS ABS: 0 10*3/uL (ref 0.0–0.1)
Basophils Relative: 0 %
EOS ABS: 0 10*3/uL (ref 0.0–0.7)
Eosinophils Relative: 0 %
HCT: 30 % — ABNORMAL LOW (ref 39.0–52.0)
Hemoglobin: 9.2 g/dL — ABNORMAL LOW (ref 13.0–17.0)
LYMPHS ABS: 1.3 10*3/uL (ref 0.7–4.0)
LYMPHS PCT: 9 %
MCH: 22.1 pg — ABNORMAL LOW (ref 26.0–34.0)
MCHC: 30.7 g/dL (ref 30.0–36.0)
MCV: 72.1 fL — ABNORMAL LOW (ref 78.0–100.0)
MONO ABS: 1 10*3/uL (ref 0.1–1.0)
Monocytes Relative: 7 %
NEUTROS ABS: 11.8 10*3/uL — AB (ref 1.7–7.7)
Neutrophils Relative %: 84 %
PLATELETS: 517 10*3/uL — AB (ref 150–400)
RBC: 4.16 MIL/uL — ABNORMAL LOW (ref 4.22–5.81)
RDW: 17.6 % — AB (ref 11.5–15.5)
WBC: 14.1 10*3/uL — ABNORMAL HIGH (ref 4.0–10.5)

## 2016-10-21 LAB — I-STAT CG4 LACTIC ACID, ED: Lactic Acid, Venous: 1.16 mmol/L (ref 0.5–1.9)

## 2016-10-21 MED ORDER — VANCOMYCIN HCL IN DEXTROSE 1-5 GM/200ML-% IV SOLN
1000.0000 mg | Freq: Three times a day (TID) | INTRAVENOUS | Status: DC
  Administered 2016-10-21 – 2016-10-23 (×6): 1000 mg via INTRAVENOUS
  Filled 2016-10-21 (×6): qty 200

## 2016-10-21 MED ORDER — MAGNESIUM CITRATE PO SOLN
0.5000 | Freq: Once | ORAL | Status: DC | PRN
  Administered 2016-10-22: 1 via ORAL
  Filled 2016-10-21: qty 296

## 2016-10-21 MED ORDER — SODIUM CHLORIDE 0.9 % IV BOLUS (SEPSIS)
1000.0000 mL | Freq: Once | INTRAVENOUS | Status: AC
  Administered 2016-10-21: 1000 mL via INTRAVENOUS

## 2016-10-21 MED ORDER — ACETAMINOPHEN 500 MG PO TABS
1000.0000 mg | ORAL_TABLET | Freq: Four times a day (QID) | ORAL | Status: DC | PRN
  Administered 2016-10-21 – 2016-10-23 (×4): 1000 mg via ORAL
  Filled 2016-10-21 (×4): qty 2

## 2016-10-21 MED ORDER — SACUBITRIL-VALSARTAN 49-51 MG PO TABS
1.0000 | ORAL_TABLET | Freq: Two times a day (BID) | ORAL | Status: DC
  Administered 2016-10-22 – 2016-10-23 (×2): 1 via ORAL
  Filled 2016-10-21 (×4): qty 1

## 2016-10-21 MED ORDER — FLUTICASONE PROPIONATE 50 MCG/ACT NA SUSP
2.0000 | Freq: Every day | NASAL | Status: DC
  Administered 2016-10-23 – 2016-10-27 (×5): 2 via NASAL
  Filled 2016-10-21: qty 16

## 2016-10-21 MED ORDER — MOMETASONE FURO-FORMOTEROL FUM 200-5 MCG/ACT IN AERO
2.0000 | INHALATION_SPRAY | Freq: Two times a day (BID) | RESPIRATORY_TRACT | Status: DC
  Administered 2016-10-22 – 2016-10-27 (×9): 2 via RESPIRATORY_TRACT
  Filled 2016-10-21 (×2): qty 8.8

## 2016-10-21 MED ORDER — ALBUTEROL SULFATE (2.5 MG/3ML) 0.083% IN NEBU: 2.5000 mg | INHALATION_SOLUTION | Freq: Four times a day (QID) | RESPIRATORY_TRACT | Status: DC | PRN

## 2016-10-21 MED ORDER — IBUPROFEN 600 MG PO TABS
600.0000 mg | ORAL_TABLET | Freq: Four times a day (QID) | ORAL | Status: DC | PRN
  Administered 2016-10-22 – 2016-10-26 (×4): 600 mg via ORAL
  Filled 2016-10-21 (×4): qty 3

## 2016-10-21 MED ORDER — DOCUSATE SODIUM 100 MG PO CAPS: 100.0000 mg | ORAL_CAPSULE | Freq: Two times a day (BID) | ORAL | Status: DC | PRN

## 2016-10-21 MED ORDER — FAMOTIDINE IN NACL 20-0.9 MG/50ML-% IV SOLN
20.0000 mg | Freq: Once | INTRAVENOUS | Status: AC
  Administered 2016-10-21: 20 mg via INTRAVENOUS
  Filled 2016-10-21: qty 50

## 2016-10-21 MED ORDER — CLINDAMYCIN PHOSPHATE 600 MG/50ML IV SOLN: 600.0000 mg | Freq: Once | INTRAVENOUS | Status: DC

## 2016-10-21 MED ORDER — SODIUM CHLORIDE 0.45 % IV SOLN
INTRAVENOUS | Status: DC
  Filled 2016-10-21 (×2): qty 1000

## 2016-10-21 MED ORDER — PANTOPRAZOLE SODIUM 40 MG PO TBEC
40.0000 mg | DELAYED_RELEASE_TABLET | Freq: Every day | ORAL | Status: DC
  Administered 2016-10-22 – 2016-10-27 (×6): 40 mg via ORAL
  Filled 2016-10-21 (×7): qty 1

## 2016-10-21 MED ORDER — SODIUM CHLORIDE 0.45 % IV SOLN
INTRAVENOUS | Status: DC
  Administered 2016-10-21: 125 mL/h via INTRAVENOUS

## 2016-10-21 MED ORDER — SODIUM CHLORIDE 0.45 % IV SOLN
INTRAVENOUS | Status: AC
  Administered 2016-10-22: 05:00:00 via INTRAVENOUS

## 2016-10-21 MED ORDER — ENOXAPARIN SODIUM 40 MG/0.4ML ~~LOC~~ SOLN
40.0000 mg | SUBCUTANEOUS | Status: DC
  Administered 2016-10-21 – 2016-10-27 (×7): 40 mg via SUBCUTANEOUS
  Filled 2016-10-21 (×7): qty 0.4

## 2016-10-21 MED ORDER — SPIRONOLACTONE 25 MG PO TABS
12.5000 mg | ORAL_TABLET | Freq: Every day | ORAL | Status: DC
  Administered 2016-10-22 – 2016-10-23 (×2): 12.5 mg via ORAL
  Filled 2016-10-21 (×2): qty 1

## 2016-10-21 MED ORDER — ONDANSETRON HCL 4 MG/2ML IJ SOLN
4.0000 mg | Freq: Once | INTRAMUSCULAR | Status: AC
  Administered 2016-10-21: 4 mg via INTRAVENOUS
  Filled 2016-10-21: qty 2

## 2016-10-21 MED ORDER — BISACODYL 10 MG RE SUPP
10.0000 mg | Freq: Every day | RECTAL | Status: DC | PRN
  Filled 2016-10-21: qty 1

## 2016-10-21 MED ORDER — GABAPENTIN 800 MG PO TABS
800.0000 mg | ORAL_TABLET | Freq: Three times a day (TID) | ORAL | Status: DC
  Filled 2016-10-21: qty 1

## 2016-10-21 MED ORDER — CARVEDILOL 12.5 MG PO TABS
12.5000 mg | ORAL_TABLET | Freq: Two times a day (BID) | ORAL | Status: DC
  Administered 2016-10-21 – 2016-10-23 (×4): 12.5 mg via ORAL
  Filled 2016-10-21 (×5): qty 1

## 2016-10-21 MED ORDER — ALUM & MAG HYDROXIDE-SIMETH 200-200-20 MG/5ML PO SUSP: 15.0000 mL | Freq: Once | ORAL | Status: DC

## 2016-10-21 NOTE — ED Notes (Signed)
Pt was sitting in trash can when entering room. Pt said he had to go and fast (BM)

## 2016-10-21 NOTE — Progress Notes (Signed)
Pharmacy Antibiotic Note  Leonard [name] is a 41 y.o. male admitted on 10/21/2016 with cellulitis.  Pharmacy has been consulted for vancomycin dosing. Pt is afebrile and WBC is elevated at 14.1. SCr and lactic acid are WNL. Of note recent hernia surgery 2/25.   Plan: Vancomycin 1gm IV Q8H F/u renal fxn, C&S, clinical status and trough at SS     Temp (24hrs), Avg:98.5 F (36.9 C), Min:98.5 F (36.9 C), Max:98.5 F (36.9 C)   Recent Labs Lab 10/17/16 1711 10/18/16 0544 10/19/16 0345 10/21/16 1429 10/21/16 1451  WBC 8.2 6.2  --  14.1*  --   CREATININE 0.96 0.88 1.05 0.95  --   LATICACIDVEN  --   --   --   --  1.16    Estimated Creatinine Clearance: 134.7 mL/min (by C-G formula based on SCr of 0.95 mg/dL).    No Known Allergies  Antimicrobials this admission: Vanc 2/28>>  Dose adjustments this admission: N/A  Microbiology results: Pending  Thank you for allowing pharmacy to be a part of this patient's care.  [name], Rande Lawman 10/21/2016 4:06 PM

## 2016-10-21 NOTE — ED Notes (Signed)
Pt sitting up at side of bed c/o severe gas pain at right lower abdomen. Dr. Dayna Barker notified of pain and nausea.

## 2016-10-21 NOTE — ED Provider Notes (Signed)
[facility] DEPT Provider Note   CSN: [id number] Arrival date & time: 10/21/16  1232     History   Chief Complaint Chief Complaint  Patient presents with  . Abdominal Pain  . Post-op Problem    HPI Leonard [name] is a 41 y.o. male.  Patient with history of congestive heart failure secondary to chemotherapy from high-grade non-Hodgkin's lymphoma, recent surgery for incarcerated incisional hernia performed on 2/25, discharged on 2/26. Patient states that he went home and ate a large amount of food. He had increase in abdominal bloating and felt like he had used the restroom, but could not. He has not had a bowel movement since surgery. He did not have any vomiting. He states he only passed gas on 2 occasions. No fevers, chest pain, or shortness of breath. No urinary symptoms. No treatments prior to arrival. He has not called his surgeon regarding his symptoms. The onset of this condition was acute. The course is worsening. Aggravating factors: none. Alleviating factors: none.        Past Medical History:  Diagnosis Date  . Asthma   . Blood transfusion without reported diagnosis   . CHF (congestive heart failure) ([place]) 04/05/2016  . COPD (chronic obstructive pulmonary disease) ([place])   . Hypertension   . Lymphoma ([place])    x 2  . Sleep apnea   . Thyroid disease     Patient Active Problem List   Diagnosis Date Noted  . Incarcerated hernia 10/18/2016  . Right lower lobe lung mass 10/18/2016  . Acute on chronic congestive heart failure ([place])   . Incarcerated umbilical hernia   . Asthma 10/06/2016  . History of substance abuse 10/06/2016  . TSH elevation 10/06/2016  . Bronchitis 10/06/2016  . Chronic pain syndrome 10/06/2016  . History of non-Hodgkin's lymphoma 04/30/2016  . Snoring 04/30/2016  . Chronic combined systolic and diastolic congestive heart failure ([place]) 04/05/2016  . Essential hypertension 10/09/2015    Past Surgical History:  Procedure Laterality Date    . CARDIAC CATHETERIZATION N/A 04/15/2016   Procedure: Right/Left Heart Cath and Coronary Angiography;  Surgeon: Larey Dresser, MD;  Location: [facility];  Service: Cardiovascular;  Laterality: N/A;  . LAPAROSCOPIC ASSISTED SPIGELIAN HERNIA REPAIR N/A 10/18/2016   Procedure: LAPAROSCOPIC REPAIR OF UMBILICAL HERNIA  WITH MESH AND LYSIS OF ADHESIONS.;  Surgeon: [name], MD;  Location: [facility];  Service: General;  Laterality: N/A;  . tumor biopsy    . WISDOM TOOTH EXTRACTION         Home Medications    Prior to Admission medications   Medication Sig Start Date End Date Taking? Authorizing Provider  acetaminophen (TYLENOL) 500 MG tablet Take 1 tablet (500 mg total) by mouth every 6 (six) hours as needed. Patient taking differently: Take 500 mg by mouth every 6 (six) hours as needed for moderate pain.  01/01/16   Marella Chimes, PA-C  albuterol (PROVENTIL HFA;VENTOLIN HFA) 108 (90 Base) MCG/ACT inhaler Inhale 2 puffs into the lungs every 6 (six) hours as needed for wheezing or shortness of breath. 10/06/16   Alexa Angela Burke, MD  albuterol (PROVENTIL) (2.5 MG/3ML) 0.083% nebulizer solution Take 3 mLs (2.5 mg total) by nebulization every 6 (six) hours as needed for wheezing or shortness of breath. 10/06/16   Florinda Marker, MD  carvedilol (COREG) 12.5 MG tablet Take 1 tablet (12.5 mg total) by mouth 2 (two) times daily with a meal. 09/28/16   Shirley Friar, PA-C  fluticasone (FLONASE) 50 MCG/ACT nasal spray Place 2 sprays into both nostrils daily. 10/06/16   Florinda Marker, MD  Fluticasone-Salmeterol (ADVAIR) 250-50 MCG/DOSE AEPB Inhale 2 puffs into the lungs 2 (two) times daily. 10/06/16   Alexa Angela Burke, MD  furosemide (LASIX) 40 MG tablet Take 1 tablet (40 mg total) by mouth 2 (two) times daily. 09/14/16 12/13/16  Larey Dresser, MD  gabapentin (NEURONTIN) 800 MG tablet Take 1 tablet (800 mg total) by mouth 3 (three) times daily. 10/06/16   Alexa Angela Burke, MD   guaiFENesin-dextromethorphan (ROBITUSSIN DM) 100-10 MG/5ML syrup Take 5 mLs by mouth every 4 (four) hours as needed for cough. 10/06/16   Alexa Angela Burke, MD  HYDROcodone-acetaminophen (NORCO) 5-325 MG tablet Take 1 tablet by mouth every 6 (six) hours as needed for moderate pain. 10/19/16 10/24/16  Velna Ochs, MD  Multiple Vitamins-Minerals (MULTIVITAMIN WITH MINERALS) tablet Take 1 tablet by mouth daily.    Historical Provider, MD  pantoprazole (PROTONIX) 40 MG tablet Take 1 tablet (40 mg total) by mouth daily. 10/06/16   Alexa Angela Burke, MD  potassium chloride SA (K-DUR,KLOR-CON) 20 MEQ tablet Take 40 mEq by mouth daily.    Historical Provider, MD  sacubitril-valsartan (ENTRESTO) 49-51 MG Take 1 tablet by mouth 2 (two) times daily. 09/14/16   Larey Dresser, MD  spironolactone (ALDACTONE) 25 MG tablet TAKE 1/2 TABLET BY MOUTH EACH DAY 09/03/16   Amy Estrella Deeds, NP    Family History Family History  Problem Relation Age of Onset  . Cancer Mother   . Emphysema Mother   . Bronchitis Mother     Social History Social History  Substance Use Topics  . Smoking status: Former Smoker    Packs/day: 1.25    Years: 10.00    Types: Cigarettes    Quit date: 04/08/2007  . Smokeless tobacco: Current User    Types: Snuff  . Alcohol use No     Allergies   Patient has no known allergies.   Review of Systems Review of Systems  Constitutional: Negative for fever.  HENT: Negative for rhinorrhea and sore throat.   Eyes: Negative for redness.  Respiratory: Negative for cough.   Cardiovascular: Negative for chest pain.  Gastrointestinal: Positive for abdominal distention, abdominal pain and nausea. Negative for blood in stool, diarrhea and vomiting.  Genitourinary: Negative for dysuria.  Musculoskeletal: Negative for myalgias.  Skin: Negative for rash.  Neurological: Negative for headaches.     Physical Exam Updated Vital Signs BP 125/91   Pulse 120   Temp 98.5 F (36.9 C) (Oral)   Resp (!)  35   SpO2 97%   Physical Exam  Constitutional: He appears well-developed and well-nourished.  HENT:  Head: Normocephalic and atraumatic.  Mouth/Throat: Oropharynx is clear and moist.  Eyes: Conjunctivae are normal. Right eye exhibits no discharge. Left eye exhibits no discharge.  Neck: Normal range of motion. Neck supple.  Cardiovascular: Regular rhythm and normal heart sounds.  Tachycardia present.   Pulmonary/Chest: Effort normal and breath sounds normal.  Abdominal: Soft. He exhibits distension. There is tenderness. There is no rebound and no guarding.  Multiple incision sites noted, none of which appear infected. Patient has generalized abdominal tenderness. Patient has cellulitic changes of the bilateral lower abdomen inferior to the umbilicus to superior to the pelvis. Area is warm to palpation.  Neurological: He is alert.  Skin: Skin is warm and dry.  Psychiatric: He has a normal mood and affect.  Nursing note and  vitals reviewed.    ED Treatments / Results  Labs (all labs ordered are listed, but only abnormal results are displayed) Labs Reviewed  CBC WITH DIFFERENTIAL/PLATELET - Abnormal; Notable for the following:       Result Value   WBC 14.1 (*)    RBC 4.16 (*)    Hemoglobin 9.2 (*)    HCT 30.0 (*)    MCV 72.1 (*)    MCH 22.1 (*)    RDW 17.6 (*)    Platelets 517 (*)    Neutro Abs 11.8 (*)    All other components within normal limits  COMPREHENSIVE METABOLIC PANEL - Abnormal; Notable for the following:    Glucose, Bld 108 (*)    Albumin 3.1 (*)    ALT 13 (*)    All other components within normal limits  I-STAT CG4 LACTIC ACID, ED    Radiology Dg Abd Acute W/chest  Result Date: 10/21/2016 CLINICAL DATA:  Severe abdominal pain for several days, no bowel movement for more than 1 week, history of recent hernia surgery EXAM: DG ABDOMEN ACUTE W/ 1V CHEST COMPARISON:  CT abdomen pelvis of 10/17/2016 FINDINGS: The minimal haziness remains at the right lung base, also  well seen on the upright view of the abdomen suspicious for patchy pneumonia. Pleuroparenchymal scarring in the left upper lobe near the apex is stable. The heart is within normal limits in size. Supine and erect views of the abdomen show scattered air-fluid levels which may represent ileus versus partial small bowel obstruction. No significant distention of small bowel loops is seen on the supine film. No opaque calculi are seen. IMPRESSION: 1. Patchy opacity remains at the right lung base suspicious for a focal area of pneumonia. 2. Scattered air-fluid levels throughout small bowel could indicate ileus versus partial SBO. No free air is seen. Electronically Signed   By: Ivar Drape M.D.   On: 10/21/2016 15:16    Procedures Procedures (including critical care time)  Medications Ordered in ED Medications  bisacodyl (DULCOLAX) suppository 10 mg (not administered)  sodium chloride 0.9 % bolus 1,000 mL (1,000 mLs Intravenous New Bag/Given 10/21/16 1656)  vancomycin (VANCOCIN) IVPB 1000 mg/200 mL premix (1,000 mg Intravenous New Bag/Given 10/21/16 1656)  0.45 % sodium chloride infusion (not administered)     Initial Impression / Assessment and Plan / ED Course  I have reviewed the triage vital signs and the nursing notes.  Pertinent labs & imaging results that were available during my care of the patient were reviewed by me and considered in my medical decision making (see chart for details).     Spoke with surgery PA who has seen patient. Will treat cellulitis with IV clinda. Acute abd series shows ileus vs SBO. Surgery will consider CT scan to further eval. Asked that I re-admit to medical team given CHF history.   4:03 PM Spoke with Dr. Benjamine Mola of [facility] who will see and admit.     Final Clinical Impressions(s) / ED Diagnoses   Final diagnoses:  Cellulitis of abdominal wall  Generalized abdominal pain  Small bowel obstruction   Admit.   New Prescriptions New Prescriptions   No  medications on file     Carlisle Cater, PA-C 10/21/16 1734    Charlesetta Shanks, MD 10/29/16 [phone]

## 2016-10-21 NOTE — Consult Note (Signed)
[facility] Surgery Consult Note  Leonard [name] Dec 27, 1975  [id number].    Requesting MD: Johnney Killian Chief Complaint/Reason for Consult: Abdominal pain  HPI:  Leonard [name] is a 41yo male PMH non-Hodgkin's lymphoma and cardiomyopathy, who presented to [facility] earlier today with increased abdominal distension and pain. H/o laparoscopic incisional hernia repair with mesh 10/18/16 by Dr. Kieth Brightly. Patient was discharged on 10/19/16 in good condition. States that the night he was discharged he ate pizza, bbq, corn... He has had gradually worsening abdominal pain ever since. Passing little to no flatus. Last BM was 10/16/16. States that he feels nauseated but has not vomited. He has tried metamucil, stool softeners, and magnesium citrate with no benefit.  He denies any SOB, CP, cough, fever, chills, or dysuria.  Hospital workup: - XR shows scattered air-fluid levels throughout small bowel could indicate ileus versus partial SBO, no free air is seen - WBC 14.1, lactic acid WNL, afebrile, tachycardic at 120  PMH significant for h/o high-grade non-Hodgkin's lymphoma, nonischemic cardiomyopathy Abdominal surgical history includes laparoscopic umbilical hernia repair with mesh 2002 and 10/19/16 Former smoker  ROS: Review of Systems  Constitutional: Negative.   HENT: Negative.   Eyes: Negative.   Respiratory: Negative.   Cardiovascular: Negative.   Gastrointestinal: Positive for abdominal pain, constipation and nausea. Negative for blood in stool, diarrhea, heartburn, melena and vomiting.  Genitourinary: Negative.   Musculoskeletal: Negative.   Skin: Negative.   Neurological: Negative.   All systems reviewed and otherwise negative except for as above  Family History  Problem Relation Age of Onset  . Cancer Mother   . Emphysema Mother   . Bronchitis Mother     Past Medical History:  Diagnosis Date  . Asthma   . Blood transfusion without reported diagnosis   . CHF (congestive heart  failure) ([place]) 04/05/2016  . COPD (chronic obstructive pulmonary disease) ([place])   . Hypertension   . Lymphoma ([place])    x 2  . Sleep apnea   . Thyroid disease     Past Surgical History:  Procedure Laterality Date  . CARDIAC CATHETERIZATION N/A 04/15/2016   Procedure: Right/Left Heart Cath and Coronary Angiography;  Surgeon: Larey Dresser, MD;  Location: [facility];  Service: Cardiovascular;  Laterality: N/A;  . LAPAROSCOPIC ASSISTED SPIGELIAN HERNIA REPAIR N/A 10/18/2016   Procedure: LAPAROSCOPIC REPAIR OF UMBILICAL HERNIA  WITH MESH AND LYSIS OF ADHESIONS.;  Surgeon: [name], MD;  Location: [facility];  Service: General;  Laterality: N/A;  . tumor biopsy    . WISDOM TOOTH EXTRACTION      Social History:  reports that he quit smoking about 9 years ago. His smoking use included Cigarettes. He has a 12.50 pack-year smoking history. His smokeless tobacco use includes Snuff. He reports that he does not drink alcohol or use drugs.  Allergies: No Known Allergies   (Not in a hospital admission)  Prior to Admission medications   Medication Sig Start Date End Date Taking? Authorizing Provider  acetaminophen (TYLENOL) 500 MG tablet Take 1 tablet (500 mg total) by mouth every 6 (six) hours as needed. Patient taking differently: Take 500 mg by mouth every 6 (six) hours as needed for moderate pain.  01/01/16   Marella Chimes, PA-C  albuterol (PROVENTIL HFA;VENTOLIN HFA) 108 (90 Base) MCG/ACT inhaler Inhale 2 puffs into the lungs every 6 (six) hours as needed for wheezing or shortness of breath. 10/06/16   Florinda Marker, MD  albuterol (PROVENTIL) (2.5 MG/3ML) 0.083% nebulizer  solution Take 3 mLs (2.5 mg total) by nebulization every 6 (six) hours as needed for wheezing or shortness of breath. 10/06/16   Florinda Marker, MD  carvedilol (COREG) 12.5 MG tablet Take 1 tablet (12.5 mg total) by mouth 2 (two) times daily with a meal. 09/28/16   Shirley Friar, PA-C  fluticasone  [facility]) 50 MCG/ACT nasal spray Place 2 sprays into both nostrils daily. 10/06/16   Florinda Marker, MD  Fluticasone-Salmeterol (ADVAIR) 250-50 MCG/DOSE AEPB Inhale 2 puffs into the lungs 2 (two) times daily. 10/06/16   Alexa Angela Burke, MD  furosemide (LASIX) 40 MG tablet Take 1 tablet (40 mg total) by mouth 2 (two) times daily. 09/14/16 12/13/16  Larey Dresser, MD  gabapentin (NEURONTIN) 800 MG tablet Take 1 tablet (800 mg total) by mouth 3 (three) times daily. 10/06/16   Alexa Angela Burke, MD  guaiFENesin-dextromethorphan (ROBITUSSIN DM) 100-10 MG/5ML syrup Take 5 mLs by mouth every 4 (four) hours as needed for cough. 10/06/16   Alexa Angela Burke, MD  HYDROcodone-acetaminophen (NORCO) 5-325 MG tablet Take 1 tablet by mouth every 6 (six) hours as needed for moderate pain. 10/19/16 10/24/16  Velna Ochs, MD  Multiple Vitamins-Minerals (MULTIVITAMIN WITH MINERALS) tablet Take 1 tablet by mouth daily.    Historical Provider, MD  pantoprazole (PROTONIX) 40 MG tablet Take 1 tablet (40 mg total) by mouth daily. 10/06/16   Alexa Angela Burke, MD  potassium chloride SA (K-DUR,KLOR-CON) 20 MEQ tablet Take 40 mEq by mouth daily.    Historical Provider, MD  sacubitril-valsartan (ENTRESTO) 49-51 MG Take 1 tablet by mouth 2 (two) times daily. 09/14/16   Larey Dresser, MD  spironolactone (ALDACTONE) 25 MG tablet TAKE 1/2 TABLET BY MOUTH EACH DAY 09/03/16   Amy D Clegg, NP    Blood pressure 125/91, pulse 120, temperature 98.5 F (36.9 C), temperature source Oral, resp. rate (!) 35, SpO2 97 %. Physical Exam: General: WD/WN white male who is lying in bed and appears very uncomfortable HEENT: head is normocephalic, atraumatic.  Sclera are noninjected.  Mouth is pink and moist Heart: regular, rate, and rhythm.  No obvious murmurs, gallops, or rubs noted.  Palpable pedal pulses bilaterally Lungs: diminished breath sounds bilateral bases, no wheezes, rhonchi, or rales noted.  Respiratory effort nonlabored MS: all 4 extremities are  symmetrical with no cyanosis, clubbing, or edema. Skin: warm and dry  Psych: A&Ox3 with an appropriate affect. Neuro: CM 2-12 intact, extremity CSM intact bilaterally, normal speech Abd: multiple well healing lap incisions, obese, soft, mild distension, few BS, no masses, hernias, or organomegaly. Mild to moderate global tenderness to palpation with no focal tenderness. Significant erythema across the entire lower abdomen extending around the umbilicus     No results found for this or any previous visit (from the past 48 hour(s)). No results found.    Assessment/Plan H/o laparoscopic incisional hernia repair with mesh 10/18/16 by Dr. Kieth Brightly Ileus vs partial SBO - NPO and bowel rest, daily dulcolax suppositories Cellulitis vs Ecchymosis - antibiotics as below Leukocytosis - 14.1. Continue to monitor  H/o high-grade non-Hodgkin's lymphoma Nonischemic cardiomyopathy  ID - vancomycin VTE - SCDs FEN - IVF, NPO  Plan - Recommend medicine admit for multiple medical problems. He either has a postop ileus or a partial SBO. Recommend bowel rest and NPO, dulcolcax suppositories. May need to consider NG tube if he starts to vomit but will wait for now. Will start him on vancomycin for possible cellulitis. Encourage mobilization/IS. Will continue  to follow.  Jerrye Beavers, [facility] Surgery 10/21/2016, 2:49 PM Pager: [phone] Consults: [phone] Mon-Fri 7:00 am-4:30 pm Sat-Sun 7:00 am-11:30 am

## 2016-10-21 NOTE — ED Triage Notes (Signed)
Pt arrives from home s/p umbilical hernia repair c/o worsening abd pain, reports no BM for 6 days.  Abd distended, redness noted RLQ, LLQ. Pt denies increased belching, N/V.

## 2016-10-21 NOTE — Telephone Encounter (Signed)
[facility] calls and states pt called 911 and stated he has not had BM in 9 days, he is having abd pain 9/10 and is very upset, he is ask to transport pt to  and is agreeable

## 2016-10-21 NOTE — Telephone Encounter (Signed)
Thank you. Yes he should be evaluated as he recently had surgery for incarcerated abd hernia.

## 2016-10-21 NOTE — ED Notes (Signed)
Patient transported to X-ray 

## 2016-10-21 NOTE — H&P (Signed)
Date: 10/21/2016               Patient Name:  Leonard [name] MRN: [id number]  DOB: 1976-07-27 Age / Sex: 41 y.o., male   PCP: Alphonzo Grieve, MD         Medical Service: Internal Medicine Teaching Service         Attending Physician: Dr. Lucious Groves, DO    First Contact: Dr. Danford Bad Pager: [phone]  Second Contact: Dr. Benjamine Mola  Pager: [phone]       After Hours (After 5p/  First Contact Pager: [phone]  weekends / holidays): Second Contact Pager: [phone]   Chief Complaint: post op abdominal pain  History of Present Illness:  Mr. 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 Patient presents with progressive abdominal pain, nausea, vomiting and constipation. Patient has tried advil, hydrocodone, and goody's powders for pain, as well as stool softeners and mag citrate at home without relief. Patient also endorsed some shortness of breath that he attributed to abdominal distention and pain.   In the ED, patient received dulcolax suppositories and had multiple bowel movement with significant improvement in his pain. He also had resolution of his nausea, vomiting, and shortness of breath.  Meds:  Current Meds  Medication Sig  . albuterol (PROVENTIL HFA;VENTOLIN HFA) 108 (90 Base) MCG/ACT inhaler Inhale 2 puffs into the lungs every 6 (six) hours as needed for wheezing or shortness of breath.  Marland Kitchen albuterol (PROVENTIL) (2.5 MG/3ML) 0.083% nebulizer solution Take 3 mLs (2.5 mg total) by nebulization every 6 (six) hours as needed for wheezing or shortness of breath.  . carvedilol (COREG) 12.5 MG tablet Take 1 tablet (12.5 mg total) by mouth 2 (two) times daily with a meal.  . docusate sodium (COLACE) 100 MG capsule Take 100 mg by mouth 2 (two) times daily as needed for mild constipation.  . fluticasone (FLONASE) 50  MCG/ACT nasal spray Place 2 sprays into both nostrils daily.  . Fluticasone-Salmeterol (ADVAIR) 250-50 MCG/DOSE AEPB Inhale 2 puffs into the lungs 2 (two) times daily.  . furosemide (LASIX) 40 MG tablet Take 1 tablet (40 mg total) by mouth 2 (two) times daily.  Marland Kitchen gabapentin (NEURONTIN) 800 MG tablet Take 1 tablet (800 mg total) by mouth 3 (three) times daily.  Marland Kitchen guaiFENesin-dextromethorphan (ROBITUSSIN DM) 100-10 MG/5ML syrup Take 5 mLs by mouth every 4 (four) hours as needed for cough.  Marland Kitchen HYDROcodone-acetaminophen (NORCO) 5-325 MG tablet Take 1 tablet by mouth every 6 (six) hours as needed for moderate pain.  . magnesium citrate SOLN Take 0.5-1 Bottles by mouth once as needed for mild constipation.  . pantoprazole (PROTONIX) 40 MG tablet Take 1 tablet (40 mg total) by mouth daily.  . potassium chloride SA (K-DUR,KLOR-CON) 20 MEQ tablet Take 40 mEq by mouth daily.  . psyllium (METAMUCIL) 58.6 % packet Take 1 packet by mouth daily as needed (for constipation).  . sacubitril-valsartan (ENTRESTO) 49-51 MG Take 1 tablet by mouth 2 (two) times daily.  Marland Kitchen spironolactone (ALDACTONE) 25 MG tablet TAKE 1/2 TABLET BY MOUTH EACH DAY   Allergies: Allergies as of 10/21/2016  . (No Known Allergies)   Past Medical History:  Diagnosis Date  . Asthma   . Blood transfusion without reported diagnosis   . CHF (congestive heart failure) ([place]) 04/05/2016  . COPD (chronic obstructive pulmonary disease) (  [place])   . Hypertension   . Lymphoma ([place])    x 2  . Sleep apnea   . Thyroid disease    Family History: Mother with history of Emphysema.  Social History: Patient is former smoker, now uses smokeless tobacco; he denies alcohol use; his last cocaine use was about 10-12 days ago.   Review of Systems: A complete ROS was negative except as per HPI.   Physical Exam: Blood pressure 124/80, pulse 119, temperature 98.5 F (36.9 C), temperature source Oral, resp. rate 18, SpO2 97 %. General: alert, well-developed,  and cooperative to examination.  Head: normocephalic and atraumatic.  Eyes: vision grossly intact, pupils equal, pupils round, pupils reactive to light, no injection and anicteric.  Mouth: pharynx pink and moist, no erythema, and no exudates.  Neck: supple, full ROM, no thyromegaly, no JVD.  Lungs: normal respiratory effort, no accessory muscle use, mild diffuse wheezing Heart: tacchycardic, regular rhythm, no murmur, no gallop, and no rub.  Abdomen: soft, decreased bowel sounds, erythema over lower abdomen with tenderness to palpation over this area, otherwise abdomen is nontender and without guarding or rebound.  Msk: no joint swelling, no joint warmth, and no redness over joints.  Pulses: 2+ DP/PT pulses bilaterally Extremities: No cyanosis, clubbing, edema Neurologic: alert & oriented X3, cranial nerves II-XII intact, strength normal in all extremities, sensation intact to light touch, and gait normal.  Skin: turgor normal and no rashes; erythema over lower abdomen.  Psych: Oriented X3, memory intact for recent and remote, normally interactive, good eye contact, not anxious appearing, and not depressed appearing  Abd Acute w chest: Scattered air-fluid levels throughout small bowel; no free air seen  Assessment & Plan by Problem: Active Problems:   Postoperative ileus (HCC)  Post-operative ileus: Patient with recent laparoscopic repair of incarcerated ventral hernia with progressive abdominal distention, pain, nausea, vomiting and constipation. Imaging, exam and symptoms consistent with post-op ileus. Patient had multiple bowel movements following use of suppositories with improvement in symptoms. Surgery evaluated patient and agree with below plan. --NPO except sips with meds, ice chips --1/2NS 118ml/hr --mag citrate, colace, dulcolax  --if nausea/vomiting returns, would benefit from NG tube  Cellulitis: Patient with cellulitis over lower abdomen in setting of recent laparoscopic  abdominal surgery.  --IV Vanc, will switch to oral if consistently making improvement  NICM: Patient with NICM in setting of prior lymphoma treatment. During last admission we repeated an ECHO which showed improved EF to 40-45% with diffuse hypokinesis. Patient's home regimen includes carvedilol 12.5mg  BID, Furosemide 40mg  BID, Entresto 49-51 BID, and spironolactone 12.5mg  daily.  --Entresto BID --spironolactone 12.5mg  daily --carvedilol 12.5mg  daily --hold lasix for now; may restart after adequately rehydrated  Asthma: Patient continued on home regimen of advair and albuterol PRN.   Diet: NPO IVF: 1/2NS DVT: lovenox Code: FULL  Dispo: Admit patient to Inpatient with expected length of stay greater than 2 midnights.  Signed: Alphonzo Grieve, MD 10/21/2016, 7:58 PM  Pager [phone]

## 2016-10-21 NOTE — Progress Notes (Signed)
Paramedicine Encounter    Patient ID: Leonard [name], male    DOB: 1975/09/29, 41 y.o.   MRN: [id number]   Patient Care Team: Alphonzo Grieve, MD as PCP - General (Internal Medicine)  Patient Active Problem List   Diagnosis Date Noted  . Incarcerated hernia 10/18/2016  . Right lower lobe lung mass 10/18/2016  . Acute on chronic congestive heart failure ([place])   . Incarcerated umbilical hernia   . Asthma 10/06/2016  . History of substance abuse 10/06/2016  . TSH elevation 10/06/2016  . Bronchitis 10/06/2016  . Chronic pain syndrome 10/06/2016  . History of non-Hodgkin's lymphoma 04/30/2016  . Snoring 04/30/2016  . Chronic combined systolic and diastolic congestive heart failure ([place]) 04/05/2016  . Essential hypertension 10/09/2015    Current Outpatient Prescriptions:  .  acetaminophen (TYLENOL) 500 MG tablet, Take 1 tablet (500 mg total) by mouth every 6 (six) hours as needed. (Patient taking differently: Take 500 mg by mouth every 6 (six) hours as needed for moderate pain. ), Disp: 30 tablet, Rfl: 0 .  albuterol (PROVENTIL HFA;VENTOLIN HFA) 108 (90 Base) MCG/ACT inhaler, Inhale 2 puffs into the lungs every 6 (six) hours as needed for wheezing or shortness of breath., Disp: 1 Inhaler, Rfl: 3 .  albuterol (PROVENTIL) (2.5 MG/3ML) 0.083% nebulizer solution, Take 3 mLs (2.5 mg total) by nebulization every 6 (six) hours as needed for wheezing or shortness of breath., Disp: 75 mL, Rfl: 12 .  carvedilol (COREG) 12.5 MG tablet, Take 1 tablet (12.5 mg total) by mouth 2 (two) times daily with a meal., Disp: 60 tablet, Rfl: 3 .  fluticasone (FLONASE) 50 MCG/ACT nasal spray, Place 2 sprays into both nostrils daily., Disp: 16 g, Rfl: 2 .  Fluticasone-Salmeterol (ADVAIR) 250-50 MCG/DOSE AEPB, Inhale 2 puffs into the lungs 2 (two) times daily., Disp: 60 each, Rfl: 3 .  furosemide (LASIX) 40 MG tablet, Take 1 tablet (40 mg total) by mouth 2 (two) times daily., Disp: 60 tablet, Rfl: 5 .  gabapentin  (NEURONTIN) 800 MG tablet, Take 1 tablet (800 mg total) by mouth 3 (three) times daily., Disp: 90 tablet, Rfl: 0 .  guaiFENesin-dextromethorphan (ROBITUSSIN DM) 100-10 MG/5ML syrup, Take 5 mLs by mouth every 4 (four) hours as needed for cough., Disp: 118 mL, Rfl: 0 .  HYDROcodone-acetaminophen (NORCO) 5-325 MG tablet, Take 1 tablet by mouth every 6 (six) hours as needed for moderate pain., Disp: 20 tablet, Rfl: 0 .  Multiple Vitamins-Minerals (MULTIVITAMIN WITH MINERALS) tablet, Take 1 tablet by mouth daily., Disp: , Rfl:  .  pantoprazole (PROTONIX) 40 MG tablet, Take 1 tablet (40 mg total) by mouth daily., Disp: 30 tablet, Rfl: 11 .  potassium chloride SA (K-DUR,KLOR-CON) 20 MEQ tablet, Take 40 mEq by mouth daily., Disp: , Rfl:  .  sacubitril-valsartan (ENTRESTO) 49-51 MG, Take 1 tablet by mouth 2 (two) times daily., Disp: 60 tablet, Rfl: 5 .  spironolactone (ALDACTONE) 25 MG tablet, TAKE 1/2 TABLET BY MOUTH EACH DAY, Disp: 17 tablet, Rfl: 2 No Known Allergies   Social History   Social History  . Marital status: Single    Spouse name: N/A  . Number of children: N/A  . Years of education: N/A   Occupational History  . Not on file.   Social History Main Topics  . Smoking status: Former Smoker    Packs/day: 1.25    Years: 10.00    Types: Cigarettes    Quit date: 04/08/2007  . Smokeless tobacco: Current User    Types:  Snuff  . Alcohol use No  . Drug use: No  . Sexual activity: Yes   Other Topics Concern  . Not on file   Social History Narrative  . No narrative on file    Physical Exam  Constitutional: He appears well-developed.  Neck: Normal range of motion.  Abdominal: He exhibits distension. There is tenderness. There is guarding.  Neurological: He is alert.  Skin: Skin is warm. No pallor.        Future Appointments Date Time Provider [place]  10/26/2016 9:45 AM [facility] CLINIC [facility]  11/03/2016 8:45 AM [facility] ACUTE CARE CLINIC  [facility]  11/05/2016 8:00 PM [facility] ROOM 3 [facility]  11/09/2016 9:00 AM Darreld Mclean, MD [facility] None  11/10/2016 10:40 AM [facility] CLINIC [facility] None   BP (!) 142/100 (BP Location: Left Arm, Patient Position: Sitting, Cuff Size: Normal)   Pulse (!) 114   Resp 20   Wt 241 lb (109.3 kg)   SpO2 90%   BMI 32.69 kg/m  Weight yesterday-240 Last visit weight-240  Leonard [name] contacted [facility] today due to severe abdominal pain. He stated he has not had a bowel movement since having surgery to repair a hernia last week. His abdomen is distended and tender to palpate. [name] was contacted by [facility] and advised they could work him in this afternoon or if he was in too much discomfort to wait he should present to the ED at [facility] and have the staff contact them when he arrives. Leonard [name] was taken to the ED by [facility].  Jacquiline Doe, EMT-Paramedic  Marylouise Stacks, EMT-Paramedic 10/21/16  ACTION: Home visit completed Next visit planned for after discharge

## 2016-10-22 ENCOUNTER — Inpatient Hospital Stay (HOSPITAL_COMMUNITY): Payer: Medicaid Other

## 2016-10-22 DIAGNOSIS — L0390 Cellulitis, unspecified: Secondary | ICD-10-CM

## 2016-10-22 LAB — BASIC METABOLIC PANEL
Anion gap: 8 (ref 5–15)
BUN: 11 mg/dL (ref 6–20)
CHLORIDE: 103 mmol/L (ref 101–111)
CO2: 24 mmol/L (ref 22–32)
CREATININE: 1.13 mg/dL (ref 0.61–1.24)
Calcium: 8.9 mg/dL (ref 8.9–10.3)
GFR calc Af Amer: >60 mL/min (ref >60)
GFR calc non Af Amer: >60 mL/min (ref >60)
GLUCOSE: 109 mg/dL — AB (ref 65–99)
Potassium: 4.8 mmol/L (ref 3.5–5.1)
Sodium: 135 mmol/L (ref 135–145)

## 2016-10-22 LAB — CBC
HCT: 33.2 % — ABNORMAL LOW (ref 39.0–52.0)
Hemoglobin: 9.7 g/dL — ABNORMAL LOW (ref 13.0–17.0)
MCH: 21.3 pg — AB (ref 26.0–34.0)
MCHC: 29.2 g/dL — AB (ref 30.0–36.0)
MCV: 72.8 fL — AB (ref 78.0–100.0)
PLATELETS: 654 10*3/uL — AB (ref 150–400)
RBC: 4.56 MIL/uL (ref 4.22–5.81)
RDW: 17.8 % — ABNORMAL HIGH (ref 11.5–15.5)
WBC: 8.4 10*3/uL (ref 4.0–10.5)

## 2016-10-22 MED ORDER — GABAPENTIN 400 MG PO CAPS
800.0000 mg | ORAL_CAPSULE | Freq: Three times a day (TID) | ORAL | Status: DC
  Administered 2016-10-22 – 2016-10-27 (×13): 800 mg via ORAL
  Filled 2016-10-22 (×14): qty 2

## 2016-10-22 MED ORDER — PROMETHAZINE HCL 25 MG/ML IJ SOLN
12.5000 mg | Freq: Four times a day (QID) | INTRAMUSCULAR | Status: DC | PRN
  Administered 2016-10-26: 12.5 mg via INTRAVENOUS
  Filled 2016-10-22: qty 1

## 2016-10-22 MED ORDER — BISACODYL 10 MG RE SUPP
10.0000 mg | Freq: Every day | RECTAL | Status: DC
  Administered 2016-10-22: 10 mg via RECTAL
  Filled 2016-10-22: qty 1

## 2016-10-22 MED ORDER — HYDROCODONE-ACETAMINOPHEN 5-325 MG PO TABS
1.0000 | ORAL_TABLET | Freq: Once | ORAL | Status: AC
  Administered 2016-10-22: 1 via ORAL
  Filled 2016-10-22: qty 1

## 2016-10-22 MED ORDER — MAGNESIUM CITRATE PO SOLN
1.0000 | Freq: Once | ORAL | Status: AC
  Administered 2016-10-22: 1 via ORAL
  Filled 2016-10-22: qty 296

## 2016-10-22 MED ORDER — DOCUSATE SODIUM 100 MG PO CAPS
100.0000 mg | ORAL_CAPSULE | Freq: Two times a day (BID) | ORAL | Status: DC
  Administered 2016-10-22 – 2016-10-23 (×3): 100 mg via ORAL
  Filled 2016-10-22 (×3): qty 1

## 2016-10-22 NOTE — Progress Notes (Addendum)
   Subjective: Mr. Cipolla was seen and evaluated today at bedside. Reports his abdominal pain was completely resolved after several large bowel movements yesterday. Unfortunately developed more abdominal pain overnight which was associated with low-grade fever at 100.6*. He reports he has not had any more BMs since yesterday. Still passing gas.   Objective:  Vital signs in last 24 hours: Vitals:   10/21/16 2000 10/21/16 2100 10/22/16 0552 10/22/16 1353  BP: 129/79 119/76 133/77 108/62  Pulse: (!) 122 (!) 124 (!) 116 (!) 103  Resp:  20 20 18   Temp:  (!) 100.6 F (38.1 C) 97.6 F (36.4 C) 99.3 F (37.4 C)  TempSrc:  Oral Oral Oral  SpO2: 99% 97% 100% 100%  Weight:  240 lb 15.4 oz (109.3 kg)    Height:  6' (1.829 m)     General: In no acute distress. Resting comfortably in bedside chair. HENT: EOMI. No conjunctival injection, icterus or ptosis.  Cardiovascular: Regular rate and rhythm. No murmur or rub appreciated. Pulmonary: CTA BL, no wheezing, crackles or rhonchi appreciated. Unlabored breathing.  Abdomen: Soft. Pannus with erythema however improved. +Bowel sounds Skin: Warm, dry. No peripheral edema noted BL LE. No gross deformities. Many visible tattoos.  Psych: Mood normal and affect was mood congruent. Responds to questions appropriately.   Assessment/Plan:  Active Problems:   Postoperative ileus (HCC)   Cellulitis  Post-op Ileus: Had large volume of bowel movement yesterday in ED. Has not yet had any further bowel movements. KUB ordered overnight shows moderate amount of stool however without intestinal dilation. Will administer Dulcolax suppository as well as Colace BID and Mag citrate. Suspect abdominal discomfort will improve with continued bowel movements -Dulcolax suppository  -Colace BID -Mag citrate x1 -Also has PRN Mag citrate on board -HIV and Hep C ab -Repeat CBC in AM   Abdominal wall cellulitis: on IV Vancomycin and appears to be tolerating well. Abdominal  wall erythema improved from yesterdays examination. Was febrile overnight at 100.6*. Will continue IV Vanc until patient afebrile for at least 1 day.   NICM: Due to radiation from NHL treatment. Currently not volume overloaded and we are holding diuretics in setting of dehyrdation.  -Continue Entresto BID, Spironolactone 12.5 mg daily and Carvedilol 12.5 mg daily -Restart Lasix once euvolemic  Asthma: Advair and albuterol PRN.  Dispo: Anticipated discharge tomorrow.  Airyn Ellzey, DO 10/22/2016, 2:08 PM Pager: [phone]

## 2016-10-22 NOTE — Progress Notes (Signed)
Subjective: Had a BM Still with moderate abdominal pain  Objective: Vital signs in last 24 hours: Temp:  [97.6 F (36.4 C)-100.6 F (38.1 C)] 97.6 F (36.4 C) (03/01 0552) Pulse Rate:  [114-132] 116 (03/01 0552) Resp:  [18-35] 20 (03/01 0552) BP: (101-142)/(59-100) 133/77 (03/01 0552) SpO2:  [90 %-100 %] 100 % (03/01 0552) Weight:  [109.3 kg (240 lb 15.4 oz)-109.3 kg (241 lb)] 109.3 kg (240 lb 15.4 oz) (02/28 2100) Last BM Date: 10/21/16 (during admission)  Intake/Output from previous day: 02/28 0701 - 03/01 0700 In: 50 [IV Piggyback:50] Out: -  Intake/Output this shift: No intake/output data recorded.  Exam: Abdomen soft, erythema less  Lab Results:   Recent Labs  10/21/16 1429 10/22/16 0659  WBC 14.1* 8.4  HGB 9.2* 9.7*  HCT 30.0* 33.2*  PLT 517* 654*   BMET  Recent Labs  10/21/16 1429 10/22/16 0611  NA 136 135  K 4.3 4.8  CL 101 103  CO2 25 24  GLUCOSE 108* 109*  BUN 14 11  CREATININE 0.95 1.13  CALCIUM 8.9 8.9   PT/INR No results for input(s): LABPROT, INR in the last 72 hours. ABG No results for input(s): PHART, HCO3 in the last 72 hours.  Invalid input(s): PCO2, PO2  Studies/Results: Dg Abd 1 View  Result Date: 10/22/2016 CLINICAL DATA:  Hernia repair.  Constipation . EXAM: ABDOMEN - 1 VIEW COMPARISON:  10/21/2016. FINDINGS: Soft tissue structures are unremarkable. Moderate stool volume. No prominent bowel distention. Several nondistended air-filled loops of small bowel are noted. No free air. No acute bony abnormality. IMPRESSION: Moderate stool volume.  No bowel distention. Electronically Signed   By: Marcello Moores  Register   On: 10/22/2016 08:32   Dg Abd Acute W/chest  Result Date: 10/21/2016 CLINICAL DATA:  Severe abdominal pain for several days, no bowel movement for more than 1 week, history of recent hernia surgery EXAM: DG ABDOMEN ACUTE W/ 1V CHEST COMPARISON:  CT abdomen pelvis of 10/17/2016 FINDINGS: The minimal haziness remains at the  right lung base, also well seen on the upright view of the abdomen suspicious for patchy pneumonia. Pleuroparenchymal scarring in the left upper lobe near the apex is stable. The heart is within normal limits in size. Supine and erect views of the abdomen show scattered air-fluid levels which may represent ileus versus partial small bowel obstruction. No significant distention of small bowel loops is seen on the supine film. No opaque calculi are seen. IMPRESSION: 1. Patchy opacity remains at the right lung base suspicious for a focal area of pneumonia. 2. Scattered air-fluid levels throughout small bowel could indicate ileus versus partial SBO. No free air is seen. Electronically Signed   By: Ivar Drape M.D.   On: 10/21/2016 15:16    Anti-infectives: Anti-infectives    Start     Dose/Rate Route Frequency Ordered Stop   10/21/16 1630  vancomycin (VANCOCIN) IVPB 1000 mg/200 mL premix     1,000 mg 200 mL/hr over 60 Minutes Intravenous Every 8 hours 10/21/16 1605     10/21/16 1600  clindamycin (CLEOCIN) IVPB 600 mg  Status:  Discontinued     600 mg 100 mL/hr over 30 Minutes Intravenous  Once 10/21/16 1548 10/21/16 1554      Assessment/Plan:  I think this is post op ileus and pain from a large hernia repair.  He definitely advanced his diet too quickly. We need to go slowly this time with advancing diet. Needs IVFs  LOS: 1 day    [facility]  A 10/22/2016

## 2016-10-22 NOTE — Progress Notes (Signed)
Pt now stating he is "freezing." Temp dropped from 100 to 97. Pt visibly shaking and continues to call out in pain. Ibuprofen provided no relief for pain. MD paged again and will be coming up to assess the patient.

## 2016-10-22 NOTE — Progress Notes (Addendum)
I was informed of Mr. Armitage's abdominal pain and rigors and evaluated him at bedside. He is having abdominal pain and nausea. The pain is suprapubic and radiating into his groin and over his right lower quadrant. He does not have pain over his diaphragm. He had slight burning with urination, denies frequency. He has not had vomiting since admission. This pain is not as bad as the pain he was having at presentation. His initial pain resolved after numerous bowel movements. He has tried tylenol and ibuprofen, these have not worked to relieve the pain.   On exam he appears diaphoretic and has increased work of breathing. His abdomen is soft and distended, he has diffuse pain to light palpation.   Pain may be related to illeus and cellulitis. Other concerns are nephrolithiasis or possibly perforation.  - ordered IV phenergan  -ordered hydrocodone- acetaminophen 5-325 mg one time dose  - ordered STAT abdominal xray  -stat CBC  - continue NS at 125 ml/ hr

## 2016-10-22 NOTE — Progress Notes (Signed)
RN heard pt groaning around 0500. Assessed pt and he is stating that he has " a pain I've never felt before." Pt describing pain as a sharp, stabbing pain on either side of abdomen. Denies nausea or vomiting. Not tender to touch. Pt initially refused ibuprofen. Attempted to reposition pt, pt also ambulating in room. Pt continues to be in extreme discomfort. MD notified of new pain. MD requested pt try ibuprofen and reevaluate. Pt agreeable to this. Will continue to monitor.

## 2016-10-23 ENCOUNTER — Inpatient Hospital Stay (HOSPITAL_COMMUNITY): Payer: Medicaid Other

## 2016-10-23 DIAGNOSIS — N179 Acute kidney failure, unspecified: Secondary | ICD-10-CM

## 2016-10-23 LAB — BASIC METABOLIC PANEL
ANION GAP: 12 (ref 5–15)
Anion gap: 10 (ref 5–15)
BUN: 20 mg/dL (ref 6–20)
BUN: 28 mg/dL — AB (ref 6–20)
CALCIUM: 8.3 mg/dL — AB (ref 8.9–10.3)
CHLORIDE: 94 mmol/L — AB (ref 101–111)
CO2: 24 mmol/L (ref 22–32)
CO2: 25 mmol/L (ref 22–32)
CREATININE: 1.63 mg/dL — AB (ref 0.61–1.24)
CREATININE: 2.42 mg/dL — AB (ref 0.61–1.24)
Calcium: 8.7 mg/dL — ABNORMAL LOW (ref 8.9–10.3)
Chloride: 92 mmol/L — ABNORMAL LOW (ref 101–111)
GFR calc Af Amer: 37 mL/min — ABNORMAL LOW (ref >60)
GFR calc non Af Amer: 52 mL/min — ABNORMAL LOW (ref >60)
GFR, EST NON AFRICAN AMERICAN: 32 mL/min — AB (ref >60)
GLUCOSE: 117 mg/dL — AB (ref 65–99)
Glucose, Bld: 113 mg/dL — ABNORMAL HIGH (ref 65–99)
POTASSIUM: 3.5 mmol/L (ref 3.5–5.1)
Potassium: 3.9 mmol/L (ref 3.5–5.1)
SODIUM: 130 mmol/L — AB (ref 135–145)
SODIUM: 127 mmol/L — AB (ref 135–145)

## 2016-10-23 LAB — CBC
HCT: 26.9 % — ABNORMAL LOW (ref 39.0–52.0)
HEMOGLOBIN: 8.3 g/dL — AB (ref 13.0–17.0)
MCH: 21.7 pg — ABNORMAL LOW (ref 26.0–34.0)
MCHC: 30.9 g/dL (ref 30.0–36.0)
MCV: 70.4 fL — AB (ref 78.0–100.0)
PLATELETS: 446 10*3/uL — AB (ref 150–400)
RBC: 3.82 MIL/uL — AB (ref 4.22–5.81)
RDW: 18 % — ABNORMAL HIGH (ref 11.5–15.5)
WBC: 10.8 10*3/uL — AB (ref 4.0–10.5)

## 2016-10-23 LAB — BLOOD GAS, ARTERIAL
ACID-BASE EXCESS: 1.9 mmol/L (ref 0.0–2.0)
BICARBONATE: 25 mmol/L (ref 20.0–28.0)
DRAWN BY: 330991
FIO2: 21
O2 Saturation: 93.8 %
PATIENT TEMPERATURE: 101.2
pCO2 arterial: 34.8 mmHg (ref 32.0–48.0)
pH, Arterial: 7.477 — ABNORMAL HIGH (ref 7.350–7.450)
pO2, Arterial: 73 mmHg — ABNORMAL LOW (ref 83.0–108.0)

## 2016-10-23 LAB — VANCOMYCIN, RANDOM: VANCOMYCIN RM: 19

## 2016-10-23 LAB — TROPONIN I: TROPONIN I: 0.4 ng/mL — AB (ref <0.03)

## 2016-10-23 LAB — LACTIC ACID, PLASMA: LACTIC ACID, VENOUS: 1.7 mmol/L (ref 0.5–1.9)

## 2016-10-23 LAB — FERRITIN: FERRITIN: 149 ng/mL (ref 24–336)

## 2016-10-23 LAB — HIV ANTIBODY (ROUTINE TESTING W REFLEX): HIV Screen 4th Generation wRfx: NONREACTIVE

## 2016-10-23 MED ORDER — BISACODYL 10 MG RE SUPP: 10.0000 mg | Freq: Two times a day (BID) | RECTAL | Status: DC

## 2016-10-23 MED ORDER — SODIUM CHLORIDE 0.9 % IV BOLUS (SEPSIS)
500.0000 mL | Freq: Once | INTRAVENOUS | Status: AC
  Administered 2016-10-23: 500 mL via INTRAVENOUS

## 2016-10-23 MED ORDER — SODIUM CHLORIDE 0.9 % IV SOLN
INTRAVENOUS | Status: AC
  Administered 2016-10-23 – 2016-10-24 (×2): via INTRAVENOUS

## 2016-10-23 MED ORDER — SIMETHICONE 80 MG PO CHEW
80.0000 mg | CHEWABLE_TABLET | Freq: Four times a day (QID) | ORAL | Status: DC | PRN
  Administered 2016-10-25: 80 mg via ORAL
  Filled 2016-10-23: qty 1

## 2016-10-23 MED ORDER — DOXYCYCLINE HYCLATE 100 MG PO TABS
100.0000 mg | ORAL_TABLET | Freq: Two times a day (BID) | ORAL | Status: DC
  Administered 2016-10-23: 100 mg via ORAL
  Filled 2016-10-23: qty 1

## 2016-10-23 MED ORDER — SIMETHICONE 80 MG PO CHEW
80.0000 mg | CHEWABLE_TABLET | Freq: Three times a day (TID) | ORAL | Status: DC
  Administered 2016-10-23 – 2016-10-27 (×12): 80 mg via ORAL
  Filled 2016-10-23 (×13): qty 1

## 2016-10-23 MED ORDER — MORPHINE SULFATE (PF) 2 MG/ML IV SOLN
2.0000 mg | Freq: Once | INTRAVENOUS | Status: AC
  Administered 2016-10-23: 2 mg via INTRAVENOUS
  Filled 2016-10-23: qty 1

## 2016-10-23 MED ORDER — BISACODYL 10 MG RE SUPP
10.0000 mg | Freq: Every day | RECTAL | Status: DC | PRN
  Administered 2016-10-23: 10 mg via RECTAL
  Filled 2016-10-23: qty 1

## 2016-10-23 MED ORDER — ACETAMINOPHEN 500 MG PO TABS: 1000.0000 mg | ORAL_TABLET | Freq: Four times a day (QID) | ORAL | Status: DC

## 2016-10-23 MED ORDER — ACETAMINOPHEN 650 MG RE SUPP
650.0000 mg | RECTAL | Status: DC | PRN
  Administered 2016-10-23 – 2016-10-24 (×2): 650 mg via RECTAL
  Filled 2016-10-23 (×2): qty 1

## 2016-10-23 MED ORDER — PIPERACILLIN-TAZOBACTAM 3.375 G IVPB
3.3750 g | Freq: Three times a day (TID) | INTRAVENOUS | Status: DC
  Administered 2016-10-23 – 2016-10-27 (×11): 3.375 g via INTRAVENOUS
  Filled 2016-10-23 (×14): qty 50

## 2016-10-23 MED ORDER — VANCOMYCIN HCL 10 G IV SOLR
1250.0000 mg | INTRAVENOUS | Status: DC
  Administered 2016-10-24 (×2): 1250 mg via INTRAVENOUS
  Filled 2016-10-23 (×2): qty 1250

## 2016-10-23 MED ORDER — ACETAMINOPHEN 325 MG PO TABS
650.0000 mg | ORAL_TABLET | ORAL | Status: DC | PRN
  Administered 2016-10-24 – 2016-10-27 (×8): 650 mg via ORAL
  Filled 2016-10-23 (×8): qty 2

## 2016-10-23 MED ORDER — PIPERACILLIN-TAZOBACTAM 3.375 G IVPB 30 MIN
3.3750 g | Freq: Once | INTRAVENOUS | Status: DC
  Filled 2016-10-23: qty 50

## 2016-10-23 NOTE — Progress Notes (Signed)
Called to patient's room for BP of 79/52 and temp of 101.8 oral at 1400. Pt stated feeling . Paged Resident Romelle Starcher Molt and notified charge nurse. Charge nurse called Rapid Response Nurse to evaluate patient. PRN tylenol given. Blood cultures, chest xray, abd xray, and lactic acid ordered. PRN dulcolax suppository given per verbal order. Repeat BP 95/54. Will continue to monitor.

## 2016-10-23 NOTE — Progress Notes (Signed)
Patient transferred to 4E room 22 accompanied by Rapid Response RN.

## 2016-10-23 NOTE — Significant Event (Signed)
Rapid Response Event Note  Overview:  Called by RN to evaluate patient Time Called: 1403 Arrival Time: 1410 Event Type: Hypotension, Other (Comment)  Initial Focused Assessment:  Called by RN for patient with hypotension and fevers.  On my arrival to patients room, RN at bedside.  Patient lying in bed with c/o flu like body aches all over with rigors, flushed and complaints of feeling cold He states his abdmonal pain is about the same.  Currently BP is 79/52, HR 108, RR 18, 95% on RA, temp 101.8 oral.  Skin is hot.  Patient states this came on all of a sudden after he ate lunch.  He states he has to cough but is trying not to because it hurts to take a deep breath.  BP rechecked 94/54.     Interventions:  MD paged prior to my arrival, orders received by primary RN.   Plan of Care (if not transferred):  RN to monitor and call if assistance needed  Event Summary:   at      at          [facility]

## 2016-10-23 NOTE — Progress Notes (Signed)
Internal Medicine Night Float Interim Progress Note  S: Called by nursing on Mr. Morimoto at [place] for new chest pain and shortness of breath. Mr. Rueb is admitted with post-op ileus and abdominal wall cellulitis secondary to recent incarcerated hernia repair. Patient had been improving having multiple large bowel movement per day and tolerating clears. Today, he was switched from Vancomycin to Doxycycline and started having fever to 102.3. Work up was initiated with Blood cultures, CXR, KUB and CBC.   Patient received a suppository today and he states he has been having large watery bowl movements today. This evening he had a bowel movement then vomited, but denied nausea. He overall felt very weak and decided to shower. While in the shower, he felt progressively weak then developed left sided, non-radiating aching chest pain that lasted for one minute then resolved. It was not associated with shortness of breath or lightheadedness. Given his weakness, his significant other told him to sit down and she found nursing. On arrival, patient was pale, diaphoretic and weak. Vital signs showed patient was febrile to 102.3, hypotensive at 79/52, tachycardic to 105 and satting 100% on room air. There was concern he was hypoxic as his saturations dropped to 50s, but we believe this was an inaccurate read as when we switched machines, patient was satting 100% on room air.   Currently, patient feels weak. He has moderate abdominal pain, unchanged from prior days. He denies new shortness of breath, chest pain, nausea, or lightheadedness. He is most bothered by how many bowel movements he is having.   Throughout today, patient has been increasingly febrile, newly hypotensive (normotensive yesterday), tachycardic to low 100s (similar to prior), satting well on room air. Labs from today show AKI with creatinine of 1.63, increasing leukocytosis to 10.8 and decrease in hemoglobin from 9.7 to 8.3 today and new hyponatremia at  130. EKG showed sinus tachycardia without evidence of ischemia, no change in ST or [place], similar to prior.  O: Physical Exam Vitals:   10/23/16 1356 10/23/16 1438 10/23/16 1845 10/23/16 2019  BP: (!) 79/52 (!) 94/45 (!) 79/52 (!) 83/44  Pulse: (!) 108  (!) 105 (!) 108  Resp: 18     Temp: (!) 101.8 F (38.8 C)  (!) 102.3 F (39.1 C) (!) 101.2 F (38.4 C)  TempSrc: Oral  Oral Oral  SpO2: 95%  100% 94%  Weight:      Height:       General: Vital signs reviewed.  Patient is in moderate acute distress and cooperative with exam.  Cardiovascular: Tachycardic, regular rhythm Pulmonary/Chest: Clear to auscultation bilaterally, no wheezes, rales, or rhonchi. Abdominal: Soft, mildly tender, mildly distended in center, BS hypoactive, induration and erythema inferior to umbilicus, no guarding.  Extremities: No lower extremity edema bilaterally,  pulses symmetric and intact bilaterally.  Neurological: A&O, answers all questions appropriately Skin: Pale, not diaphoretic. Psychiatric: Normal mood and affect. speech and behavior is normal. Cognition and memory are normal.   A/P: Mr. Endrizzi is a 41 M admitted with abdominal wall cellulitis and post-op ileus after hernia repair who became progressively more febrile, hypotensive, and weak today in the setting of increased bowel movements.  Overall, clinical picture seems consistent with hypovolemia in the setting of insensible loses from fever and diarrhea/increased bowel movements after a stool softeners and suppositories given hypotension, AKI, hyponatremia. Patient received mag citrate yesterday afternoon. Antihypertensives were restarted yesterday. It is possible patient had a pre-syncopal/vasovagal episode after bowel movement and while standing in the  shower. There is no evidence of changes on EKG and clinical picture does not seem to fit with ACS, but we will check a troponin. I feel transient hypoxia on pulse ox was an error due to difficult read,  but we will obtain an ABG to confirm. KUB and CXR pending. Patient looks clinically hypovolemic on exam. We will start with a NS 500 cc bolus and re-evaluate.   Assessment: Pre-Syncope in the setting of Hypovolemia from GI losses and Insensible losses from fever  Plan: -Transfer to SDU -Troponin -KUB -CXR -BCx pending -NS 500 cc bolus -ABG --> normal -HOLD suppositories and stool softeners in the setting of excess bowel movements -HOLD antihypertensives -Continue Tylenol -Fever- likely still secondary to cellulitis. No obvious other sources of infection at this time, but work up is pending. Given new fever, hypotension, tachycardia, leukocytosis, will broaden to Vanc +Zosyn in the meantime.  -Low suspicion to repeat CT abdomen if worsening clinically  Martyn Malay, DO PGY-3 Internal Medicine Resident 10/23/2016 9:33 PM

## 2016-10-23 NOTE — Significant Event (Signed)
Rapid Response Event Note RN called for CP, Hypotensive Overview: Time Called: 2010 Arrival Time: 2012 Event Type: Hypotension, Cardiac  Initial Focused Assessment: On arrival pt sitting in chair slightly slumped over, pale, diaphoretic, mildly tachypneic, tachycardic, hypotensive, alert and oriented x4, follows commands. Remains slightly febrile, seen earlier today for the same and treated appropriately. Pt reports having several loose stools today, usually unable to make it to bathroom in time resulting in him taking a shower after each BM. This event occurred as pt was returning from shower, family at bedside assisted pt to chair. BP 83/44, HR 108, RR 24, 94% RA, 101.2 oral temp. Pt placed on 2L [place] for sats reading 90% which was a change from 100% just prior to this event.   Interventions: KUB, ABG 7.47/34.8/73.0/25.0, EKG unremarkable, troponin 0.40, BMP Pt transferred to 4e22  Event Summary: Name of Physician Notified: Dr. Reesa Chew at 2025    at    Outcome: Transferred (Comment)     Gevena Mart, Sela Hua

## 2016-10-23 NOTE — Progress Notes (Signed)
Pharmacy Antibiotic Note  Leonard [name] is a 41 y.o. male admitted on 10/21/2016 with cellulitis.  Pharmacy has been consulted for Vancomycin/Zosyn dosing. Vancomycin was HELD as pt received some vancomycin earlier on 3/2 and now noted to have rising Scr 1.13>>1.63>>2.42 (normalized creatinine clearance is now ~42). Random vancomycin level was drawn this evening: 19.   Plan: -Vancomycin 1250 mg IV q24h -If serum creatinine continues to rise would consider alternative agent -Trend WBC, temp -Drug levels as indicated   Height: 6' (182.9 cm) Weight: 240 lb 15.4 oz (109.3 kg) IBW/kg (Calculated) : 77.6  Temp (24hrs), Avg:102 F (38.9 C), Min:98.4 F (36.9 C), Max:104.1 F (40.1 C)   Recent Labs Lab 10/17/16 1711 10/18/16 0544 10/19/16 0345 10/21/16 1429 10/21/16 1451 10/22/16 0611 10/22/16 0659 10/23/16 0422 10/23/16 1520 10/23/16 2155  WBC 8.2 6.2  --  14.1*  --   --  8.4  --  10.8*  --   CREATININE 0.96 0.88 1.05 0.95  --  1.13  --  1.63*  --  2.42*  LATICACIDVEN  --   --   --   --  1.16  --   --   --  1.7  --   VANCORANDOM  --   --   --   --   --   --   --   --   --  19    Estimated Creatinine Clearance: 52.9 mL/min (by C-G formula based on SCr of 2.42 mg/dL (H)).    No Known Allergies  Narda Bonds 10/23/2016 11:44 PM

## 2016-10-23 NOTE — Progress Notes (Signed)
Patient ID: Leonard [name], male   DOB: May 10, 1976, 41 y.o.   MRN: [id number]  [facility] Surgery Progress Note     Subjective: Feeling somewhat better today. Patient states that he has had multiple good BM's since yesterday. He is passing flatus and belching. Abdomen still sore but less so than yesterday. He ambulated with no issues.   Objective: Vital signs in last 24 hours: Temp:  [97.8 F (36.6 C)-99.3 F (37.4 C)] 98.4 F (36.9 C) (03/02 0510) Pulse Rate:  [86-103] 86 (03/02 0510) Resp:  [16-18] 16 (03/02 0510) BP: (98-108)/(53-64) 98/53 (03/02 0510) SpO2:  [99 %-100 %] 99 % (03/02 0510) Last BM Date: 10/23/16  Intake/Output from previous day: 03/01 0701 - 03/02 0700 In: 800 [IV Piggyback:800] Out: -  Intake/Output this shift: No intake/output data recorded.  PE: Gen:  Alert, NAD, pleasant Card:  RRR, no M/G/R heard Pulm:  CTAB, no W/R/R, effort normal Abd: Soft, mild distension with fullness R>L and right sided tenderness, +BS, multiple well healed lap incisions C/D/I, lower abdominal erythema resolved Ext:  No erythema, edema, or tenderness   Lab Results:   Recent Labs  10/21/16 1429 10/22/16 0659  WBC 14.1* 8.4  HGB 9.2* 9.7*  HCT 30.0* 33.2*  PLT 517* 654*   BMET  Recent Labs  10/22/16 0611 10/23/16 0422  NA 135 130*  K 4.8 3.5  CL 103 94*  CO2 24 24  GLUCOSE 109* 113*  BUN 11 20  CREATININE 1.13 1.63*  CALCIUM 8.9 8.7*   PT/INR No results for input(s): LABPROT, INR in the last 72 hours. CMP     Component Value Date/Time   NA 130 (L) 10/23/2016 0422   K 3.5 10/23/2016 0422   CL 94 (L) 10/23/2016 0422   CO2 24 10/23/2016 0422   GLUCOSE 113 (H) 10/23/2016 0422   BUN 20 10/23/2016 0422   CREATININE 1.63 (H) 10/23/2016 0422   CALCIUM 8.7 (L) 10/23/2016 0422   PROT 7.5 10/21/2016 1429   ALBUMIN 3.1 (L) 10/21/2016 1429   AST 17 10/21/2016 1429   ALT 13 (L) 10/21/2016 1429   ALKPHOS 63 10/21/2016 1429   BILITOT 0.5 10/21/2016 1429   GFRNONAA 52 (L) 10/23/2016 0422   GFRAA >60 10/23/2016 0422   Lipase     Component Value Date/Time   LIPASE 34 10/17/2016 1711       Studies/Results: Dg Abd 1 View  Result Date: 10/22/2016 CLINICAL DATA:  Hernia repair.  Constipation . EXAM: ABDOMEN - 1 VIEW COMPARISON:  10/21/2016. FINDINGS: Soft tissue structures are unremarkable. Moderate stool volume. No prominent bowel distention. Several nondistended air-filled loops of small bowel are noted. No free air. No acute bony abnormality. IMPRESSION: Moderate stool volume.  No bowel distention. Electronically Signed   By: Marcello Moores  Register   On: 10/22/2016 08:32   Dg Abd Acute W/chest  Result Date: 10/21/2016 CLINICAL DATA:  Severe abdominal pain for several days, no bowel movement for more than 1 week, history of recent hernia surgery EXAM: DG ABDOMEN ACUTE W/ 1V CHEST COMPARISON:  CT abdomen pelvis of 10/17/2016 FINDINGS: The minimal haziness remains at the right lung base, also well seen on the upright view of the abdomen suspicious for patchy pneumonia. Pleuroparenchymal scarring in the left upper lobe near the apex is stable. The heart is within normal limits in size. Supine and erect views of the abdomen show scattered air-fluid levels which may represent ileus versus partial small bowel obstruction. No significant distention of small bowel loops  is seen on the supine film. No opaque calculi are seen. IMPRESSION: 1. Patchy opacity remains at the right lung base suspicious for a focal area of pneumonia. 2. Scattered air-fluid levels throughout small bowel could indicate ileus versus partial SBO. No free air is seen. Electronically Signed   By: Ivar Drape M.D.   On: 10/21/2016 15:16    Anti-infectives: Anti-infectives    Start     Dose/Rate Route Frequency Ordered Stop   10/23/16 1030  doxycycline (VIBRA-TABS) tablet 100 mg     100 mg Oral Every 12 hours 10/23/16 1024 10/26/16 2159   10/21/16 1630  vancomycin (VANCOCIN) IVPB 1000 mg/200  mL premix  Status:  Discontinued     1,000 mg 200 mL/hr over 60 Minutes Intravenous Every 8 hours 10/21/16 1605 10/23/16 1024   10/21/16 1600  clindamycin (CLEOCIN) IVPB 600 mg  Status:  Discontinued     600 mg 100 mL/hr over 30 Minutes Intravenous  Once 10/21/16 1548 10/21/16 1554       Assessment/Plan H/o laparoscopic incisionalhernia repair with mesh 10/18/16 by Dr. Kieth Brightly Postop Ileus  Cellulitis - improving, transition to PO antibiotics Leukocytosis - resolved  H/o high-grade non-Hodgkin's lymphoma Nonischemic cardiomyopathy  ID - vancomycin 2/28>>3/2, doxyc3/2>> VTE - SCDs FEN - IVF, full liquids  Plan - Ileus resolved but recommend taking patient very slowly. Will advance to full liquids. Continue laxatives. Continue to encourage mobilization.   LOS: 2 days    Jerrye Beavers , [facility] Surgery 10/23/2016, 10:26 AM Pager: [phone] Consults: [phone] Mon-Fri 7:00 am-4:30 pm Sat-Sun 7:00 am-11:30 am

## 2016-10-23 NOTE — Progress Notes (Signed)
Pt unable to go down to xray when transport arrived d/t emesis and diarrhea. Stated he needed to shower first. After shower, vital signs taken. BP 79/52 T 102.3 oral. Pt asymptomatic, stated he felt much better. Charge nurse notified of vital signs, said doctor needed to be called because patient could not be on this floor with temp of 102.3. Paged Resident. Still awaiting response. Report given to night shift RN Louie Casa.

## 2016-10-23 NOTE — Progress Notes (Signed)
   Subjective: [name] was seen and evaluated today at bedside. Reports several bowel movements overnight and this morning and has resolution of his abdominal pain. States he has been tolerating water and ice well. No fevers, chills overnight. No complaints.   Objective:  Vital signs in last 24 hours: Vitals:   10/22/16 1353 10/22/16 2002 10/22/16 2049 10/23/16 0510  BP: 108/62  101/64 (!) 98/53  Pulse: (!) 103  (!) 101 86  Resp: 18  18 16   Temp: 99.3 F (37.4 C)  97.8 F (36.6 C) 98.4 F (36.9 C)  TempSrc: Oral  Oral Axillary  SpO2: 100% 100% 99% 99%  Weight:      Height:       General: In no acute distress. Resting comfortably in bedside chair. HENT: EOMI. No conjunctival injection, icterus or ptosis.  Cardiovascular: Regular rate and rhythm. No murmur or rub appreciated. Pulmonary: CTA BL, no wheezing, crackles or rhonchi appreciated. Unlabored breathing.  Abdomen: Soft. No erythema. Clean dry surgical sites. +Bowel sounds Skin: Warm, dry. Trace peripheral edema noted BL LE. No gross deformities. Many visible tattoos.  Psych: Mood normal and affect was mood congruent. Responds to questions appropriately.   Assessment/Plan:  Active Problems:   Chronic combined systolic and diastolic congestive heart failure (HCC)   Postoperative ileus (HCC)   Cellulitis  Post-op Ileus: Patient has had multiple bowel movements overnight and this morning with resolution of his abdominal pain. Patient tolerated water/ice diet well yesterday and will advance to clear liquids today with possibility of soft diet this evening/tomorrow morning. Case discussed with gen surg who agree. He has laxatives and stool softeners PRN and advised to use if he were to develop symptoms of constipation again.  -Laxatives/stool softeners PRN -Advance diet to clears, possibly will advance to soft diet this evening vs tomorrow morning -HIV, Hep C screens pending  Abdominal wall cellulitis: on day 3 IV Vancomycin  and is tolerating well. Abdominal wall erythema has resolved. Afebrile 24+ hours. Will transition patient to oral antibiotics with Doxycycline given his recent healthcare exposure and subsequent risk of MRSA infection.  -D/c IV Vancomycin -Start PO Doxycycline 100 mg BID  AKI: Cr 1.63 today, was 1.13 yesterday. Suspect due to poor PO intake. He has received IVF however this was discontinued yesterday as patient was tolerating water/ice PO. Have encouraged increased fluid consumption today with his clear liquid diet.  -Repeat BMET tomorrow  NICM: Due to radiation from NHL treatment. Currently with trace edema BL LE. Have discontinued IVF.  -Continue Entresto BID, Spironolactone 12.5 mg daily and Carvedilol 12.5 mg daily -Consider restarting Lasix, although patient has AKI  Asthma: Advair and albuterol PRN.  Dispo: Anticipated discharge tomorrow.  Malone Admire, DO 10/23/2016, 9:24 AM Pager: [phone]

## 2016-10-23 NOTE — Progress Notes (Addendum)
Pharmacy Antibiotic Note  Leonard [name] is a 41 y.o. male admitted on 10/21/2016 with abdominal wall cellulitis, He was initially started on vancomycin, which has been transitioned to po doxycycline today. Pt has been spiking fevers tonight, Tm 102,3. Pharmacy has been consulted to restart vancomycin and add zosyn. Noted pt was started on vancomycin 1g Q8 hrs originally, scr 0.96 >> 1.63, concern of AKI. Most recent vancomycin dose was this morning at ~ 1000  Plan: Zosyn 3.375g IV Q 8 hrs (4hr infusion) Random vancomycin level to guide dosing F/u renal fxn, C&S, clinical status and trough at SS  Height: 6' (182.9 cm) Weight: 240 lb 15.4 oz (109.3 kg) IBW/kg (Calculated) : 77.6  Temp (24hrs), Avg:100.9 F (38.3 C), Min:98.4 F (36.9 C), Max:102.3 F (39.1 C)   Recent Labs Lab 10/17/16 1711 10/18/16 0544 10/19/16 0345 10/21/16 1429 10/21/16 1451 10/22/16 0611 10/22/16 0659 10/23/16 0422 10/23/16 1520  WBC 8.2 6.2  --  14.1*  --   --  8.4  --  10.8*  CREATININE 0.96 0.88 1.05 0.95  --  1.13  --  1.63*  --   LATICACIDVEN  --   --   --   --  1.16  --   --   --  1.7    Estimated Creatinine Clearance: 78.5 mL/min (by C-G formula based on SCr of 1.63 mg/dL (H)).    No Known Allergies  Antimicrobials this admission: Vanc 2/28>>  Zosyn 3/2 >> Doxy 3/2 x 1  Dose adjustments this admission: N/A  Microbiology results: 3/2 blood cx x 2 -   Thank you for allowing pharmacy to be a part of this patient's care.  Maryanna Shape, PharmD, BCPS  Clinical Pharmacist  Pager: [phone]   10/23/2016 9:46 PM

## 2016-10-23 NOTE — Progress Notes (Signed)
Report given to Becky R.N.

## 2016-10-23 NOTE — Progress Notes (Signed)
CRITICAL VALUE ALERT  Critical value received:  Troponin 0.40  Date of notification:  10/23/16   Time of notification:  11:09 PM   Critical value read back:Yes.    Nurse who received alert:  Levonne Hubert   MD notified (1st page):  Reesa Chew, MD  Time of first page:  11:09 PM   MD notified (2nd page):  Time of second page:  Responding MD:  Reesa Chew, MD  Time MD responded:  11:09 PM

## 2016-10-23 NOTE — Progress Notes (Signed)
Dr. Reesa Chew notified of oral temp 103.9, as well as results from KUB. No new orders at this time. Becky RN at bedside during conversation. Dr. Reesa Chew will call [facility] RN after she reviews all results.

## 2016-10-24 ENCOUNTER — Inpatient Hospital Stay (HOSPITAL_COMMUNITY): Payer: Medicaid Other

## 2016-10-24 ENCOUNTER — Encounter (HOSPITAL_COMMUNITY): Payer: Self-pay | Admitting: *Deleted

## 2016-10-24 DIAGNOSIS — R339 Retention of urine, unspecified: Secondary | ICD-10-CM

## 2016-10-24 DIAGNOSIS — Z960 Presence of urogenital implants: Secondary | ICD-10-CM

## 2016-10-24 LAB — CBC
HEMATOCRIT: 28 % — AB (ref 39.0–52.0)
Hemoglobin: 8.6 g/dL — ABNORMAL LOW (ref 13.0–17.0)
MCH: 21.4 pg — AB (ref 26.0–34.0)
MCHC: 30.7 g/dL (ref 30.0–36.0)
MCV: 69.7 fL — AB (ref 78.0–100.0)
PLATELETS: 470 10*3/uL — AB (ref 150–400)
RBC: 4.02 MIL/uL — ABNORMAL LOW (ref 4.22–5.81)
RDW: 17.6 % — AB (ref 11.5–15.5)
WBC: 14 10*3/uL — ABNORMAL HIGH (ref 4.0–10.5)

## 2016-10-24 LAB — URINALYSIS, ROUTINE W REFLEX MICROSCOPIC
BILIRUBIN URINE: NEGATIVE
Glucose, UA: NEGATIVE mg/dL
KETONES UR: NEGATIVE mg/dL
Nitrite: NEGATIVE
PH: 5 (ref 5.0–8.0)
Protein, ur: 100 mg/dL — AB
Specific Gravity, Urine: 1.018 (ref 1.005–1.030)

## 2016-10-24 LAB — BASIC METABOLIC PANEL
Anion gap: 11 (ref 5–15)
BUN: 28 mg/dL — AB (ref 6–20)
CALCIUM: 8.1 mg/dL — AB (ref 8.9–10.3)
CO2: 23 mmol/L (ref 22–32)
Chloride: 95 mmol/L — ABNORMAL LOW (ref 101–111)
Creatinine, Ser: 2.22 mg/dL — ABNORMAL HIGH (ref 0.61–1.24)
GFR calc Af Amer: 41 mL/min — ABNORMAL LOW (ref >60)
GFR, EST NON AFRICAN AMERICAN: 36 mL/min — AB (ref >60)
GLUCOSE: 108 mg/dL — AB (ref 65–99)
POTASSIUM: 4.4 mmol/L (ref 3.5–5.1)
Sodium: 129 mmol/L — ABNORMAL LOW (ref 135–145)

## 2016-10-24 LAB — PROTIME-INR
INR: 1.3
Prothrombin Time: 16.3 seconds — ABNORMAL HIGH (ref 11.4–15.2)

## 2016-10-24 LAB — HEPATITIS C ANTIBODY

## 2016-10-24 LAB — TROPONIN I
Troponin I: 0.05 ng/mL (ref <0.03)
Troponin I: 0.04 ng/mL (ref <0.03)

## 2016-10-24 MED ORDER — FENTANYL CITRATE (PF) 100 MCG/2ML IJ SOLN
INTRAMUSCULAR | Status: AC
  Filled 2016-10-24: qty 2

## 2016-10-24 MED ORDER — SODIUM CHLORIDE 0.9 % IV BOLUS (SEPSIS)
1000.0000 mL | Freq: Once | INTRAVENOUS | Status: AC
Start: 2016-10-24 — End: 2016-10-24
  Administered 2016-10-24: 1000 mL via INTRAVENOUS

## 2016-10-24 MED ORDER — MIDAZOLAM HCL 5 MG/5ML IJ SOLN
INTRAMUSCULAR | Status: AC | PRN
  Administered 2016-10-24: 1 mg via INTRAVENOUS

## 2016-10-24 MED ORDER — SODIUM CHLORIDE 0.9 % IV BOLUS (SEPSIS)
1000.0000 mL | Freq: Once | INTRAVENOUS | Status: AC
  Administered 2016-10-24: 1000 mL via INTRAVENOUS

## 2016-10-24 MED ORDER — MIDAZOLAM HCL 2 MG/2ML IJ SOLN
INTRAMUSCULAR | Status: AC
  Filled 2016-10-24: qty 2

## 2016-10-24 MED ORDER — FENTANYL CITRATE (PF) 100 MCG/2ML IJ SOLN
INTRAMUSCULAR | Status: AC | PRN
  Administered 2016-10-24 (×2): 25 ug via INTRAVENOUS

## 2016-10-24 MED ORDER — IOPAMIDOL (ISOVUE-300) INJECTION 61%
INTRAVENOUS | Status: AC
  Administered 2016-10-24: 30 mL
  Filled 2016-10-24: qty 30

## 2016-10-24 MED ORDER — LIDOCAINE HCL 1 % IJ SOLN
INTRAMUSCULAR | Status: AC
  Filled 2016-10-24: qty 20

## 2016-10-24 NOTE — Progress Notes (Signed)
   Subjective: Mr. Gattuso has had significant clinical worsening starting yesterday afternoon with continued abdominal pain worse over his RLQ, fever up to 104F, and hypotension down to 70s/60s with tachycardia. Antibiotics were escalated to broad spectrum coverage with vancomycin and zosyn. He was transferred to the stepdown unit  For close monitoring and initially for fever management with cooling blanket.  Objective:  Vital signs in last 24 hours: Vitals:   10/24/16 0904 10/24/16 0934 10/24/16 1034 10/24/16 1040  BP:  (!) 78/64  (!) 78/66  Pulse:  (!) 102    Resp:  (!) 21  (!) 25  Temp:  100 F (37.8 C) (!) 100.8 F (38.2 C)   TempSrc:  Oral Axillary   SpO2: 98%     Weight:      Height:       General: Uncomfortable sitting on side of bed with head down, but conversational and in no acute distress HENT: No conjunctival injection Cardiovascular: Regular rhythm, mild tachycardia, systolic murmur present Pulmonary: CTAB, normal WOB Abdomen: Abdomen is soft, tender over RLQ, bowel sounds diffusely reduced but present Skin: Warm, dry. Trace peripheral edema noted in lower extremities. Many visible tattoos.  Psych: Mood seemed down and somewhat flat affect this morning with feeling uncomfrotable.  Assessment/Plan:  Abdominal wall infection: his area of cellulitis looks decreased however clinical worsening from yesterday with high fever is concerning for failure to respond to initial treatment. He is hypotensive with worsening renal function concerning for some end organ dysfunction from sepsis. Antibiotics were broadened to include intraabdominal or urinary sources for infection. There is persistent pain worst over the RLQ. His abdominal exam remains benign overall not consistent with elevated pressure but we will plan to evaluate with CT, manometry given his recent surgery and some urinary retention this morning. -Bladder manometry ordered to check intraabdominal pressure -Abdominal CT  scan today to evaluate for intraabdominal infection -UA showing bloody specimen with TNTC RBCs and many leuks - unclear for traumatic catheterization versus UTI with recent surgery as a risk factor, culture ordered to exclude urinary infection source -Resumed IV vancomycin -Started Zosyn 3/2 PM  Post-op Ileus: He was passing multiple bowel movement since yesterday with what sounds like an associated near syncopal episode. -Laxatives/stool softeners PRN -Continue liquid diet  AKI: SCr worsened yesterday to 2.42. Notably he was hypotensive, also retaining urine with 600cc reported on bladder scan. He is also on treatment with vancomycin but no evidence to suggest supertherapeutic levels. -Aggressive hydration with IV fluids today -Repeat BMET tomorrow  NICM: Due to radiation from NHL treatment. He has minimal peripheral edema but lungs are clear. No real hypoxia recorded on telemetry only some episodes with poor SpO2 monitor waveform. Given his hypotension and renal dysfunction, we will continue IV fluids aggressively unless he starts to develops symptoms. -Hold Entresto BID, Spironolactone 12.5 mg daily and Carvedilol 12.5 mg daily at this time due to hypotension -Hold diuretics right now with worsened AKI  Asthma: Not in acute exacerbation. Advair and albuterol PRN.  Dispo: Due to condition worsening since yesterday, his disposition is pending clinical improvement.   Collier Salina, MD PGY-II Internal Medicine Resident Pager# [phone] 10/24/2016, 11:02 AM

## 2016-10-24 NOTE — Sedation Documentation (Signed)
Patient denies pain and is resting comfortably.  

## 2016-10-24 NOTE — Procedures (Signed)
Interventional Radiology Procedure Note  Procedure: CT guided drainage of abdominal abscess  Complications: None  Estimated Blood Loss: < 10 mL  12 Fr drain placed in anterior peritoneal abscess just deep to abdominal wall. Cloudy, foul smelling liquid return.  Sample sent for culture.  Venetia Night. Kathlene Cote, M.D Pager:  [phone]

## 2016-10-24 NOTE — Progress Notes (Addendum)
Night float interim progress note.  Initially his blood pressure improved to 92/51 after 2 L of bolus, later started dropping, it was 84/58 around 2 AM, with temperature of 101.8, respiratory rate 32 and he was saturating at 99% on 2 L of oxygen.  He also developed urinary retention, unable to void with bladder scan showing more than 647 mL.  On exam. He looks comfortable, stating that he is feeling better. Chest. Clear on anterior auscultation. CVS. Tachycardia with regular rhythm. Abdomen. Tender right lower and mid quadrant, distended, seems little more tense as compared to previous exam. No guarding or rebound, bowel sounds hypoactive.  A/P. -Increase normal saline infusion rate to 150 mL per hour. -Might give another bolus of 500 mL if needed, to keep MAP above 65. -He might need CT abdomen to rule out any pathology, currently unstable. -If his blood pressure continued to drop, we will consult PC CM for pressors, as he can easily become volume overload because of his history of heart failure. -Continue to monitor. -Insert Foley- Drained more then 1L.  Lorella Nimrod, PGY1 10/24/16  At 2:30 AM

## 2016-10-24 NOTE — Progress Notes (Signed)
General Surgery [facility]  Assessment & Plan:  POD#6 - laparoscopic incisionalhernia repair with mesh 10/18/16 by Dr. Kieth Brightly  Postop Ileus   Cellulitis - improving, transition to PO antibiotics  Leukocytosis - rebound to 14K this AM  H/o high-grade non-Hodgkin's lymphoma Nonischemic cardiomyopathy  Patient on full liquid diet.  Still resolving ileus.  Will follow up on CT Abd ordered for today by medical team.        Earnstine Regal, MD, [facility]       Office: [phone]    Subjective: Patient up in chair, nursing at bedside.  Patient notes mild RLQ abd pain.  Tolerating liquid diet.  Having loose BM's per patient.  Objective: Vital signs in last 24 hours: Temp:  [98.2 F (36.8 C)-104.1 F (40.1 C)] 100 F (37.8 C) (03/03 0934) Pulse Rate:  [73-113] 102 (03/03 0934) Resp:  [17-31] 21 (03/03 0934) BP: (72-113)/(41-100) 78/64 (03/03 0934) SpO2:  [94 %-100 %] 98 % (03/03 0904) Last BM Date: 10/23/16  Intake/Output from previous day: 03/02 0701 - 03/03 0700 In: 3196.7 [P.O.:490; I.V.:356.7; IV Piggyback:2350] Out: 575 [Urine:575] Intake/Output this shift: No intake/output data recorded.  Physical Exam: HEENT - sclerae clear, mucous membranes moist Neck - soft Abdomen - obese, mild distension; BS present; wounds dry and intact; mild tenderness anterior abd wall, diffuse   Lab Results:   Recent Labs  10/23/16 1520 10/24/16 0317  WBC 10.8* 14.0*  HGB 8.3* 8.6*  HCT 26.9* 28.0*  PLT 446* 470*   BMET  Recent Labs  10/23/16 2155 10/24/16 0317  NA 127* 129*  K 3.9 4.4  CL 92* 95*  CO2 25 23  GLUCOSE 117* 108*  BUN 28* 28*  CREATININE 2.42* 2.22*  CALCIUM 8.3* 8.1*   PT/INR No results for input(s): LABPROT, INR in the last 72 hours. Comprehensive Metabolic Panel:    Component Value Date/Time   NA 129 (L) 10/24/2016 0317   NA 127 (L) 10/23/2016 2155   K 4.4 10/24/2016 0317   K 3.9  10/23/2016 2155   CL 95 (L) 10/24/2016 0317   CL 92 (L) 10/23/2016 2155   CO2 23 10/24/2016 0317   CO2 25 10/23/2016 2155   BUN 28 (H) 10/24/2016 0317   BUN 28 (H) 10/23/2016 2155   CREATININE 2.22 (H) 10/24/2016 0317   CREATININE 2.42 (H) 10/23/2016 2155   GLUCOSE 108 (H) 10/24/2016 0317   GLUCOSE 117 (H) 10/23/2016 2155   CALCIUM 8.1 (L) 10/24/2016 0317   CALCIUM 8.3 (L) 10/23/2016 2155   AST 17 10/21/2016 1429   AST 23 10/17/2016 1711   ALT 13 (L) 10/21/2016 1429   ALT 15 (L) 10/17/2016 1711   ALKPHOS 63 10/21/2016 1429   ALKPHOS 69 10/17/2016 1711   BILITOT 0.5 10/21/2016 1429   BILITOT 0.3 10/17/2016 1711   PROT 7.5 10/21/2016 1429   PROT 7.7 10/17/2016 1711   ALBUMIN 3.1 (L) 10/21/2016 1429   ALBUMIN 3.3 (L) 10/17/2016 1711    Studies/Results: Dg Chest Port 1 View  Result Date: 10/23/2016 CLINICAL DATA:  Fever and abdominal distention. EXAM: PORTABLE CHEST 1 VIEW COMPARISON:  Chest from acute abdomen 10/21/2016 FINDINGS: Very low lung volumes limit assessment. Persistent patchy right basilar opacity, partially obscured. Progressive left perihilar opacities. Unchanged heart size and mediastinal contours. No evidence of pneumothorax or pleural fluid. IMPRESSION: Very low lung volumes. Persistent right basilar opacity, partially obscured. Development of left perihilar opacities,  may be atelectasis. Recommend correlation for aspiration. Electronically Signed   By: Jeb Levering M.D.   On: 10/23/2016 22:00   Dg Abd Portable 1v  Result Date: 10/23/2016 CLINICAL DATA:  Abdominal distention. EXAM: PORTABLE ABDOMEN - 1 VIEW COMPARISON:  Abdominal radiographs yesterday. FINDINGS: Air-filled colon appears similar to prior exam. Probable gaseous small bowel dilatation in the left abdomen. No gross evidence of free air on supine views. IMPRESSION: Probable gaseous small bowel distention in the left mid abdomen, favoring ileus over obstruction. Air-filled colon is similar. Electronically  Signed   By: Jeb Levering M.D.   On: 10/23/2016 22:02      Wanship M 10/24/2016  Patient ID: [name], male   DOB: 08-Oct-1975, 41 y.o.   MRN: [id number]

## 2016-10-24 NOTE — Progress Notes (Signed)
Internal Medicine Attending:   I saw and examined the patient. I reviewed the resident's note and I agree with the resident's findings and plan as documented in the resident's note. High grade fever overnight and borderline blood pressures was treated with IV NS bolus and antibiotic coverage expanded to Vanc/Zosyn.  Also noted to have urinary retention and foley was placed. Reports feeling a little better this morning, continues to have some RLQ tenderness and cellulitis has continued to improve, otherwise his exam is unchanged.  We have ordered a CT abd pelvis to evaluate for intraabdominal infection.  UA has returned nitrate negative but with TNTC RBC and WBC, could be 2/2 traumatic foley however will need urinary culture to exclude UTI as cause.

## 2016-10-24 NOTE — H&P (Signed)
Chief Complaint: Patient was seen in consultation today for abdominal abscess drainage at the request of Dr. Serita Grammes  Referring Physician(s): Dr. Serita Grammes  Patient Status: [facility] - In-pt  History of Present Illness: Leonard [name] is a 41 y.o. male one week post incisional hernia repair for incarcerated hernia now with fever, abdominal pain and CT evidence of large abscess just deep to anterior abdominal wall centered roughly at umbilical level.  Past Medical History:  Diagnosis Date  . Asthma   . Blood transfusion without reported diagnosis   . CHF (congestive heart failure) ([place]) 04/05/2016  . COPD (chronic obstructive pulmonary disease) ([place])   . Hypertension   . Lymphoma ([place])    x 2  . Sleep apnea   . Thyroid disease     Past Surgical History:  Procedure Laterality Date  . CARDIAC CATHETERIZATION N/A 04/15/2016   Procedure: Right/Left Heart Cath and Coronary Angiography;  Surgeon: Larey Dresser, MD;  Location: [place] [facility];  Service: Cardiovascular;  Laterality: N/A;  . LAPAROSCOPIC ASSISTED SPIGELIAN HERNIA REPAIR N/A 10/18/2016   Procedure: LAPAROSCOPIC REPAIR OF UMBILICAL HERNIA  WITH MESH AND LYSIS OF ADHESIONS.;  Surgeon: [name], MD;  Location: [place];  Service: General;  Laterality: N/A;  . tumor biopsy    . WISDOM TOOTH EXTRACTION      Allergies: Patient has no known allergies.  Medications: Prior to Admission medications   Medication Sig Start Date End Date Taking? Authorizing Provider  albuterol (PROVENTIL HFA;VENTOLIN HFA) 108 (90 Base) MCG/ACT inhaler Inhale 2 puffs into the lungs every 6 (six) hours as needed for wheezing or shortness of breath. 10/06/16  Yes Alexa Angela Burke, MD  albuterol (PROVENTIL) (2.5 MG/3ML) 0.083% nebulizer solution Take 3 mLs (2.5 mg total) by nebulization every 6 (six) hours as needed for wheezing or shortness of breath. 10/06/16  Yes Alexa Angela Burke, MD  carvedilol (COREG) 12.5 MG tablet Take 1 tablet  (12.5 mg total) by mouth 2 (two) times daily with a meal. 09/28/16  Yes Shirley Friar, PA-C  docusate sodium (COLACE) 100 MG capsule Take 100 mg by mouth 2 (two) times daily as needed for mild constipation.   Yes Historical Provider, MD  fluticasone (FLONASE) 50 MCG/ACT nasal spray Place 2 sprays into both nostrils daily. 10/06/16  Yes Alexa Angela Burke, MD  Fluticasone-Salmeterol (ADVAIR) 250-50 MCG/DOSE AEPB Inhale 2 puffs into the lungs 2 (two) times daily. 10/06/16  Yes Alexa Angela Burke, MD  furosemide (LASIX) 40 MG tablet Take 1 tablet (40 mg total) by mouth 2 (two) times daily. 09/14/16 12/13/16 Yes Larey Dresser, MD  gabapentin (NEURONTIN) 800 MG tablet Take 1 tablet (800 mg total) by mouth 3 (three) times daily. 10/06/16  Yes Alexa Angela Burke, MD  guaiFENesin-dextromethorphan (ROBITUSSIN DM) 100-10 MG/5ML syrup Take 5 mLs by mouth every 4 (four) hours as needed for cough. 10/06/16  Yes Alexa Angela Burke, MD  HYDROcodone-acetaminophen (NORCO) 5-325 MG tablet Take 1 tablet by mouth every 6 (six) hours as needed for moderate pain. 10/19/16 10/24/16 Yes Velna Ochs, MD  magnesium citrate SOLN Take 0.5-1 Bottles by mouth once as needed for mild constipation.   Yes Historical Provider, MD  pantoprazole (PROTONIX) 40 MG tablet Take 1 tablet (40 mg total) by mouth daily. 10/06/16  Yes Alexa Angela Burke, MD  potassium chloride SA (K-DUR,KLOR-CON) 20 MEQ tablet Take 40 mEq by mouth daily.   Yes Historical Provider, MD  psyllium (METAMUCIL) 58.6 % packet Take  1 packet by mouth daily as needed (for constipation).   Yes Historical Provider, MD  sacubitril-valsartan (ENTRESTO) 49-51 MG Take 1 tablet by mouth 2 (two) times daily. 09/14/16  Yes Larey Dresser, MD  spironolactone (ALDACTONE) 25 MG tablet TAKE 1/2 TABLET BY MOUTH EACH DAY 09/03/16  Yes Amy D Clegg, NP  acetaminophen (TYLENOL) 500 MG tablet Take 1 tablet (500 mg total) by mouth every 6 (six) hours as needed. Patient not taking: Reported on 10/21/2016 01/01/16    Marella Chimes, PA-C  Multiple Vitamins-Minerals (MULTIVITAMIN WITH MINERALS) tablet Take 1 tablet by mouth daily.    Historical Provider, MD     Family History  Problem Relation Age of Onset  . Cancer Mother   . Emphysema Mother   . Bronchitis Mother     Social History   Social History  . Marital status: Single    Spouse name: N/A  . Number of children: N/A  . Years of education: N/A   Social History Main Topics  . Smoking status: Former Smoker    Packs/day: 1.25    Years: 10.00    Types: Cigarettes    Quit date: 04/08/2007  . Smokeless tobacco: Current User    Types: Snuff  . Alcohol use No  . Drug use: No  . Sexual activity: Yes   Other Topics Concern  . None   Social History Narrative  . None    Review of Systems: A 12 point ROS discussed and pertinent positives are indicated in the HPI above.  All other systems are negative.  Review of Systems  Constitutional: Positive for chills and fever.  Respiratory: Negative.   Cardiovascular: Negative.   Gastrointestinal: Positive for abdominal distention, abdominal pain and nausea.  Genitourinary: Negative.   Musculoskeletal: Negative.   Neurological: Negative.     Vital Signs: BP (!) 69/40   Pulse (!) 109   Temp (!) 101.3 F (38.5 C)   Resp (!) 21   Ht 6' (1.829 m)   Wt 240 lb 15.4 oz (109.3 kg)   SpO2 100%   BMI 32.68 kg/m   Physical Exam  Constitutional: He is oriented to person, place, and time. No distress.  Cardiovascular: Normal rate and regular rhythm.  Exam reveals no gallop and no friction rub.   No murmur heard. Pulmonary/Chest: Effort normal and breath sounds normal. No respiratory distress. He has no wheezes. He has no rales.  Abdominal: Soft. He exhibits distension. There is tenderness.  Musculoskeletal: He exhibits no edema.  Neurological: He is alert and oriented to person, place, and time.  Skin: He is not diaphoretic.  Vitals reviewed.   Mallampati Score:  MD  Evaluation Airway: WNL Heart: WNL Abdomen: WNL Chest/ Lungs: WNL ASA  Classification: 3 Mallampati/Airway Score: One  Imaging: Ct Abdomen Pelvis Wo Contrast  Result Date: 10/24/2016 CLINICAL DATA:  Right-sided abdominal pain. Fever since hernia repair. EXAM: CT ABDOMEN AND PELVIS WITHOUT CONTRAST TECHNIQUE: Multidetector CT imaging of the abdomen and pelvis was performed following the standard protocol without IV contrast. COMPARISON:  Abdominal CT from 10/17/2016 FINDINGS: Lower chest:  Low-density blood pool consistent with anemia. Band of opacity in the right lower lobe with internal calcifications is stable to mildly decreased in bulk compared to prior. Trace right pleural fluid. Hepatobiliary: Subcentimeter low-density in the central liver is too small for densitometry. There could be layering material within the gallbladder but no calcified gallstone. No inflammatory features. Pancreas: Unremarkable. Spleen: Unremarkable. Adrenals/Urinary Tract: Negative adrenals. No hydronephrosis or  stone. Defect in the inferior pole left and upper pole right renal cortex attributed to scarring. Successful decompression of baldder by Foley catheter. Stomach/Bowel: Oral contrast reaches the hepatic flexure. No signs of ileus or obstruction. 15 x 6 x 15 cm gas and fluid collection in the ventral abdomen, extending into the recently repaired umbilical hernia. The neighboring omentum is reticulated and there is a discrete wall. Small volume fluid in the low pelvis and right paracolic gutter. Right paracolic fluid appears loculated and measures up to 5 cm in diameter. Small pneumoperitoneum, not unexpected. No extravasation of oral contrast. Vascular/Lymphatic: No acute vascular abnormality. No mass or adenopathy. Reproductive:No pathologic findings. Musculoskeletal: No acute abnormalities. 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 IMPRESSION: 1. 15 x 5 x 15 cm gas and fluid collection in the ventral abdomen and  umbilical hernia site consistent with abscess. 2. Small fluid only collections in the right paracolic gutter and pelvis. 3. No postoperative ileus or obstruction. 4. Bandlike opacity with calcifications in the right lower lobe is stable to decreased in bulk compared to 10/17/2016. Chest CT follow-up is again recommended, suggest waiting 6-8 weeks to allow for resolution of active inflammation. Electronically Signed   By: Monte Fantasia M.D.   On: 10/24/2016 13:29   Dg Chest 2 View  Result Date: 10/17/2016 CLINICAL DATA:  41 year old male with shortness of breath. History of CHF. EXAM: CHEST  2 VIEW COMPARISON:  Chest radiograph dated 06/08/2016 FINDINGS: Focal area of hazy nodularity in the right lower lobe appears new since the prior radiograph and may represent atelectasis/ scarring or developing infiltrate. Clinical correlation is recommended. Stable left apical pleuroparenchymal density again noted. There is no pleural effusion or pneumothorax. The cardiac silhouette is within normal limits. No acute osseous pathology. IMPRESSION: 1. Focal hazy density in the right lower lobe, new since prior radiograph. This may represent developing infiltrate. Clinical correlation and follow-up recommended. 2. Left apical pleuroparenchymal scarring, stable. Electronically Signed   By: Anner Crete M.D.   On: 10/17/2016 21:49   Dg Abd 1 View  Result Date: 10/22/2016 CLINICAL DATA:  Hernia repair.  Constipation . EXAM: ABDOMEN - 1 VIEW COMPARISON:  10/21/2016. FINDINGS: Soft tissue structures are unremarkable. Moderate stool volume. No prominent bowel distention. Several nondistended air-filled loops of small bowel are noted. No free air. No acute bony abnormality. IMPRESSION: Moderate stool volume.  No bowel distention. Electronically Signed   By: Marcello Moores  Register   On: 10/22/2016 08:32   Ct Abdomen Pelvis W Contrast  Result Date: 10/17/2016 CLINICAL DATA:  Umbilical pain.  History of abdominal hernia repair.  EXAM: CT ABDOMEN AND PELVIS WITH CONTRAST TECHNIQUE: Multidetector CT imaging of the abdomen and pelvis was performed using the standard protocol following bolus administration of intravenous contrast. CONTRAST:  166mL ISOVUE-300 IOPAMIDOL (ISOVUE-300) INJECTION 61% COMPARISON:  Chest CT 10/09/2015 FINDINGS: Lower chest: Somewhat elliptical masslike consolidation with irregular borders and a few punctate internal calcifications over the right lower lobe new compared to the previous exam. No effusion. Hepatobiliary: Couple tiny subcentimeter hypodensities within the liver too small to characterize but likely cysts. Gallbladder and biliary tree are normal. Pancreas: Within normal. Spleen: Within normal. Adrenals/Urinary Tract: Adrenal glands are normal. Kidneys are normal in size without hydronephrosis or nephrolithiasis. Ureters and bladder are normal. Stomach/Bowel: Stomach is normal. There is a small umbilical hernia containing a short segment of small bowel as the bowel loop just proximal to the hernia is dilated and fluid-filled measuring 3.5 cm in diameter. Appendix is normal. Colon  is normal. Vascular/Lymphatic: Within normal. Reproductive: Within normal. Other: No free fluid or focal inflammatory change. No free peritoneal air. Musculoskeletal: Unremarkable. IMPRESSION: Small umbilical hernia containing a short segment of small bowel as the small bowel proximal to the hernia is fluid-filled and dilated measuring 3.5 cm in diameter. This is likely early/ partial obstruction due to incarceration in this small umbilical hernia. Irregular bordered masslike consolidation containing punctate calcifications within the right lower lobe. Appearance is concerning for a neoplastic process as cannot exclude lymphomatous involvement in this patient with a history of non-Hodgkin's lymphoma. Less likely this could be due to an infectious/inflammatory process. Recommend contrast-enhanced chest CT for complete evaluation of  the thorax. Couple subcentimeter liver hypodensities too small to characterize but likely cysts. These results were called by telephone at the time of interpretation on 10/17/2016 at 10:28 pm to Dr. Theotis Burrow , who verbally acknowledged these results. Electronically Signed   By: Marin Olp M.D.   On: 10/17/2016 22:22   Dg Chest Port 1 View  Result Date: 10/23/2016 CLINICAL DATA:  Fever and abdominal distention. EXAM: PORTABLE CHEST 1 VIEW COMPARISON:  Chest from acute abdomen 10/21/2016 FINDINGS: Very low lung volumes limit assessment. Persistent patchy right basilar opacity, partially obscured. Progressive left perihilar opacities. Unchanged heart size and mediastinal contours. No evidence of pneumothorax or pleural fluid. IMPRESSION: Very low lung volumes. Persistent right basilar opacity, partially obscured. Development of left perihilar opacities, may be atelectasis. Recommend correlation for aspiration. Electronically Signed   By: Jeb Levering M.D.   On: 10/23/2016 22:00   Dg Abd Acute W/chest  Result Date: 10/21/2016 CLINICAL DATA:  Severe abdominal pain for several days, no bowel movement for more than 1 week, history of recent hernia surgery EXAM: DG ABDOMEN ACUTE W/ 1V CHEST COMPARISON:  CT abdomen pelvis of 10/17/2016 FINDINGS: The minimal haziness remains at the right lung base, also well seen on the upright view of the abdomen suspicious for patchy pneumonia. Pleuroparenchymal scarring in the left upper lobe near the apex is stable. The heart is within normal limits in size. Supine and erect views of the abdomen show scattered air-fluid levels which may represent ileus versus partial small bowel obstruction. No significant distention of small bowel loops is seen on the supine film. No opaque calculi are seen. IMPRESSION: 1. Patchy opacity remains at the right lung base suspicious for a focal area of pneumonia. 2. Scattered air-fluid levels throughout small bowel could indicate ileus  versus partial SBO. No free air is seen. Electronically Signed   By: Ivar Drape M.D.   On: 10/21/2016 15:16   Dg Abd Portable 1v  Result Date: 10/23/2016 CLINICAL DATA:  Abdominal distention. EXAM: PORTABLE ABDOMEN - 1 VIEW COMPARISON:  Abdominal radiographs yesterday. FINDINGS: Air-filled colon appears similar to prior exam. Probable gaseous small bowel dilatation in the left abdomen. No gross evidence of free air on supine views. IMPRESSION: Probable gaseous small bowel distention in the left mid abdomen, favoring ileus over obstruction. Air-filled colon is similar. Electronically Signed   By: Jeb Levering M.D.   On: 10/23/2016 22:02    Labs:  CBC:  Recent Labs  10/21/16 1429 10/22/16 0659 10/23/16 1520 10/24/16 0317  WBC 14.1* 8.4 10.8* 14.0*  HGB 9.2* 9.7* 8.3* 8.6*  HCT 30.0* 33.2* 26.9* 28.0*  PLT 517* 654* 446* 470*    COAGS:  Recent Labs  04/06/16 0209 04/15/16 0709 10/24/16 1415  INR 1.27 1.04 1.30    BMP:  Recent Labs  10/22/16  [id number] 10/23/16 0422 10/23/16 2155 10/24/16 0317  NA 135 130* 127* 129*  K 4.8 3.5 3.9 4.4  CL 103 94* 92* 95*  CO2 24 24 25 23   GLUCOSE 109* 113* 117* 108*  BUN 11 20 28* 28*  CALCIUM 8.9 8.7* 8.3* 8.1*  CREATININE 1.13 1.63* 2.42* 2.22*  GFRNONAA >60 52* 32* 36*  GFRAA >60 >60 37* 41*    LIVER FUNCTION TESTS:  Recent Labs  04/05/16 2131 10/17/16 1711 10/21/16 1429  BILITOT 0.4 0.3 0.5  AST 27 23 17   ALT 23 15* 13*  ALKPHOS 91 69 63  PROT 6.7 7.7 7.5  ALBUMIN 3.2* 3.3* 3.1*     Assessment and Plan:  CT reviewed.  Large abscess is amenable to percutaneous drainage.  Will proceed today.  Risks and benefits discussed with the patient including bleeding, infection, damage to adjacent structures, bowel perforation/fistula connection, and sepsis. All of the patient's questions were answered, patient is agreeable to proceed. Consent signed and in chart.  Electronically SignedAletta Edouard T 10/24/2016, 5:09 PM

## 2016-10-24 NOTE — Progress Notes (Signed)
2S ICU RN requested a pressure bag for this pt as she was called to set up Intrabdominal Pressure Monitoring. RT will continue to monitor.

## 2016-10-25 DIAGNOSIS — Z9689 Presence of other specified functional implants: Secondary | ICD-10-CM

## 2016-10-25 LAB — CBC
HCT: 25.1 % — ABNORMAL LOW (ref 39.0–52.0)
HEMOGLOBIN: 7.7 g/dL — AB (ref 13.0–17.0)
MCH: 21.2 pg — ABNORMAL LOW (ref 26.0–34.0)
MCHC: 30.7 g/dL (ref 30.0–36.0)
MCV: 69.1 fL — ABNORMAL LOW (ref 78.0–100.0)
PLATELETS: 383 10*3/uL (ref 150–400)
RBC: 3.63 MIL/uL — ABNORMAL LOW (ref 4.22–5.81)
RDW: 17.7 % — AB (ref 11.5–15.5)
WBC: 7.9 10*3/uL (ref 4.0–10.5)

## 2016-10-25 LAB — BASIC METABOLIC PANEL
Anion gap: 9 (ref 5–15)
BUN: 26 mg/dL — ABNORMAL HIGH (ref 6–20)
CALCIUM: 8.1 mg/dL — AB (ref 8.9–10.3)
CO2: 21 mmol/L — ABNORMAL LOW (ref 22–32)
CREATININE: 2.14 mg/dL — AB (ref 0.61–1.24)
Chloride: 99 mmol/L — ABNORMAL LOW (ref 101–111)
GFR, EST AFRICAN AMERICAN: 43 mL/min — AB (ref >60)
GFR, EST NON AFRICAN AMERICAN: 37 mL/min — AB (ref >60)
Glucose, Bld: 100 mg/dL — ABNORMAL HIGH (ref 65–99)
Potassium: 3.6 mmol/L (ref 3.5–5.1)
SODIUM: 129 mmol/L — AB (ref 135–145)

## 2016-10-25 LAB — URINE CULTURE: Culture: NO GROWTH

## 2016-10-25 MED ORDER — FUROSEMIDE 10 MG/ML IJ SOLN: 40.0000 mg | Freq: Once | INTRAMUSCULAR | Status: DC

## 2016-10-25 MED ORDER — MORPHINE SULFATE (PF) 2 MG/ML IV SOLN
2.0000 mg | Freq: Once | INTRAVENOUS | Status: AC
  Administered 2016-10-25: 2 mg via INTRAMUSCULAR

## 2016-10-25 MED ORDER — SODIUM CHLORIDE 0.9 % IV BOLUS (SEPSIS)
500.0000 mL | Freq: Once | INTRAVENOUS | Status: AC
  Administered 2016-10-25: 500 mL via INTRAVENOUS

## 2016-10-25 MED ORDER — HYDROMORPHONE HCL 1 MG/ML IJ SOLN
1.0000 mg | INTRAMUSCULAR | Status: DC | PRN
  Administered 2016-10-26 (×2): 1 mg via INTRAVENOUS
  Filled 2016-10-25 (×2): qty 1

## 2016-10-25 MED ORDER — MORPHINE SULFATE (PF) 2 MG/ML IV SOLN
2.0000 mg | Freq: Once | INTRAVENOUS | Status: DC
  Filled 2016-10-25: qty 1

## 2016-10-25 MED ORDER — FUROSEMIDE 10 MG/ML IJ SOLN
40.0000 mg | Freq: Two times a day (BID) | INTRAMUSCULAR | Status: DC
  Filled 2016-10-25: qty 4

## 2016-10-25 MED ORDER — FUROSEMIDE 10 MG/ML IJ SOLN
40.0000 mg | Freq: Two times a day (BID) | INTRAMUSCULAR | Status: DC
  Administered 2016-10-26: 40 mg via INTRAVENOUS
  Filled 2016-10-25 (×2): qty 4

## 2016-10-25 MED ORDER — MORPHINE SULFATE (PF) 2 MG/ML IV SOLN
2.0000 mg | Freq: Once | INTRAVENOUS | Status: AC
Start: 2016-10-25 — End: 2016-10-25
  Administered 2016-10-25: 2 mg via INTRAVENOUS

## 2016-10-25 MED ORDER — SENNOSIDES-DOCUSATE SODIUM 8.6-50 MG PO TABS
2.0000 | ORAL_TABLET | Freq: Every day | ORAL | Status: DC
  Administered 2016-10-25 – 2016-10-27 (×3): 2 via ORAL
  Filled 2016-10-25 (×4): qty 2

## 2016-10-25 MED ORDER — MORPHINE SULFATE (PF) 2 MG/ML IV SOLN
INTRAVENOUS | Status: AC
  Filled 2016-10-25: qty 1

## 2016-10-25 NOTE — Progress Notes (Signed)
   Subjective: Mr. Leis was seen and evaluated today at bedside. Reports he feels much better since drain placement. Denies any fevers or chills overnight. Reports they have emptied his drain 7-8 times today.   Objective:  Vital signs in last 24 hours: Vitals:   10/25/16 0500 10/25/16 0503 10/25/16 0600 10/25/16 0630  BP: (!) 79/46 (!) 76/50 (!) 87/52 100/69  Pulse: 83 83 82 87  Resp: (!) 24 (!) 25 (!) 22 (!) 24  Temp:      TempSrc:      SpO2: 98% 99% 98% 100%  Weight:      Height:       General: Sitting in bedside recliner, in no acute distressed. Much more comfortable from prior evaluations. HENT: No conjunctival injection. No ptosis or scleral icterus.  Cardiovascular: Regular rhythm, mild tachycardia, systolic murmur present Pulmonary: CTAB, normal WOB Abdomen: Abdomen is soft. Drain in place in RLQ containing tan/brown purulent fluid. Surgical wounds dry and intact. Skin: Warm, dry. Trace peripheral edema noted in lower extremities. Many visible tattoos.   Assessment/Plan:  Abdominal wall infection: Vital signs improved, Tmax today 100.4. CT abdomen/pelvis yesterday showed a 15 x 5 x 15 cm gas and fluid filled collection in the ventral abdomen and umbilical hernia site consistent with abscess. IR performed CT-guided drainage with drain placement. Culture has not yet resulted however gram stain did show GNR. Antibiotics were broadened yesterday evening after the patient continued to spike fevers to Vancomycin and Zosyn. As gram stain is growing only GNR, will discontinue Vancomycin at this time.  -D/c Vancomycin, especially in the setting of AKI -Continue IV Zosyn (start date 3/2) -Tylenol prn for fever -Surg advanced diet to regular -Repeat CBC and CMET in AM  Post-op Ileus: Appears to have resolved. Continues to pass multiple BM's per day.  -Laxatives/stool softeners PRN -Have scheduled Simethicone and Protonix  AKI: SCr worsened yesterday to 2.42, Cr 2.1 today with  aggressive fluid hydration.  -BMET in Am  NICM: Due to radiation from NHL treatment. Minimal peripheral edema and lungs clear today. Pts hypotension has resolved at this time. Will continue to hold Entresto BID, Spironolactone 12.5 mg daily and Coreg 12.5 however will consider restarting if patient were to become hypertensive. Will likely restart these tomorrow.  -Hold diuretics   Asthma: Not in acute exacerbation. Advair and albuterol PRN.  Dispo: Unclear. Discharge in approximately 2-3 days.  Lonna Rabold, D.O.  Internal Medicine Resident - PGY1 Pager # [phone]

## 2016-10-25 NOTE — Progress Notes (Signed)
Clarification obtained from Dr. Reesa Chew stating that patient should not have any oral or IV contrast for CT scan. Relayed message to CT staff. Also, made CT aware that patient is in a great deal of pain and requesting that CT be performed as soon as possible.

## 2016-10-25 NOTE — Progress Notes (Signed)
General Surgery [facility]  Assessment & Plan:  POD#6 - laparoscopic incisionalhernia repair with mesh 10/18/16 by Dr. Kieth Brightly  Intra-abdominal abscess  Percutaneous drainage by IR yesterday  Culture results pending  IV Zosyn  Plan: Advance to regular diet  OOB, ambulation  Check CBC in AM 3/5  Will follow with you         Earnstine Regal, MD, [facility]       Office: [phone]    Subjective: Patient up in room, feels much better.  Wants to eat regular diet.  Pain improved with drainage procedure.  Objective: Vital signs in last 24 hours: Temp:  [98.2 F (36.8 C)-102.8 F (39.3 C)] 98.2 F (36.8 C) (03/04 0759) Pulse Rate:  [82-110] 93 (03/04 0900) Resp:  [18-33] 18 (03/04 0900) BP: (69-125)/(39-111) 120/65 (03/04 0900) SpO2:  [95 %-100 %] 99 % (03/04 0900) Last BM Date: 10/25/16  Intake/Output from previous day: 03/03 0701 - 03/04 0700 In: 2650 [I.V.:1800; IV Piggyback:650] Out: 1975 [Urine:1435; Drains:540] Intake/Output this shift: Total I/O In: -  Out: 50 [Drains:50]  Physical Exam: HEENT - sclerae clear, mucous membranes moist Abdomen - obese; dressing dry and intact; tan brown purulent fluid in JP drain bulb; surgical wounds dry and intact Neuro - alert & oriented, no focal deficits  Lab Results:   Recent Labs  10/23/16 1520 10/24/16 0317  WBC 10.8* 14.0*  HGB 8.3* 8.6*  HCT 26.9* 28.0*  PLT 446* 470*   BMET  Recent Labs  10/23/16 2155 10/24/16 0317  NA 127* 129*  K 3.9 4.4  CL 92* 95*  CO2 25 23  GLUCOSE 117* 108*  BUN 28* 28*  CREATININE 2.42* 2.22*  CALCIUM 8.3* 8.1*   PT/INR  Recent Labs  10/24/16 1415  LABPROT 16.3*  INR 1.30   Comprehensive Metabolic Panel:    Component Value Date/Time   NA 129 (L) 10/24/2016 0317   NA 127 (L) 10/23/2016 2155   K 4.4 10/24/2016 0317   K 3.9 10/23/2016 2155   CL 95 (L) 10/24/2016 0317   CL 92 (L) 10/23/2016 2155   CO2  23 10/24/2016 0317   CO2 25 10/23/2016 2155   BUN 28 (H) 10/24/2016 0317   BUN 28 (H) 10/23/2016 2155   CREATININE 2.22 (H) 10/24/2016 0317   CREATININE 2.42 (H) 10/23/2016 2155   GLUCOSE 108 (H) 10/24/2016 0317   GLUCOSE 117 (H) 10/23/2016 2155   CALCIUM 8.1 (L) 10/24/2016 0317   CALCIUM 8.3 (L) 10/23/2016 2155   AST 17 10/21/2016 1429   AST 23 10/17/2016 1711   ALT 13 (L) 10/21/2016 1429   ALT 15 (L) 10/17/2016 1711   ALKPHOS 63 10/21/2016 1429   ALKPHOS 69 10/17/2016 1711   BILITOT 0.5 10/21/2016 1429   BILITOT 0.3 10/17/2016 1711   PROT 7.5 10/21/2016 1429   PROT 7.7 10/17/2016 1711   ALBUMIN 3.1 (L) 10/21/2016 1429   ALBUMIN 3.3 (L) 10/17/2016 1711    Studies/Results: Ct Abdomen Pelvis Wo Contrast  Result Date: 10/24/2016 CLINICAL DATA:  Right-sided abdominal pain. Fever since hernia repair. EXAM: CT ABDOMEN AND PELVIS WITHOUT CONTRAST TECHNIQUE: Multidetector CT imaging of the abdomen and pelvis was performed following the standard protocol without IV contrast. COMPARISON:  Abdominal CT from 10/17/2016 FINDINGS: Lower chest:  Low-density blood pool consistent with anemia. Band of opacity in the right lower lobe with internal calcifications is stable to mildly decreased in bulk compared  to prior. Trace right pleural fluid. Hepatobiliary: Subcentimeter low-density in the central liver is too small for densitometry. There could be layering material within the gallbladder but no calcified gallstone. No inflammatory features. Pancreas: Unremarkable. Spleen: Unremarkable. Adrenals/Urinary Tract: Negative adrenals. No hydronephrosis or stone. Defect in the inferior pole left and upper pole right renal cortex attributed to scarring. Successful decompression of baldder by Foley catheter. Stomach/Bowel: Oral contrast reaches the hepatic flexure. No signs of ileus or obstruction. 15 x 6 x 15 cm gas and fluid collection in the ventral abdomen, extending into the recently repaired umbilical  hernia. The neighboring omentum is reticulated and there is a discrete wall. Small volume fluid in the low pelvis and right paracolic gutter. Right paracolic fluid appears loculated and measures up to 5 cm in diameter. Small pneumoperitoneum, not unexpected. No extravasation of oral contrast. Vascular/Lymphatic: No acute vascular abnormality. No mass or adenopathy. Reproductive:No pathologic findings. Musculoskeletal: No acute abnormalities. 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 IMPRESSION: 1. 15 x 5 x 15 cm gas and fluid collection in the ventral abdomen and umbilical hernia site consistent with abscess. 2. Small fluid only collections in the right paracolic gutter and pelvis. 3. No postoperative ileus or obstruction. 4. Bandlike opacity with calcifications in the right lower lobe is stable to decreased in bulk compared to 10/17/2016. Chest CT follow-up is again recommended, suggest waiting 6-8 weeks to allow for resolution of active inflammation. Electronically Signed   By: Monte Fantasia M.D.   On: 10/24/2016 13:29   Dg Chest Port 1 View  Result Date: 10/23/2016 CLINICAL DATA:  Fever and abdominal distention. EXAM: PORTABLE CHEST 1 VIEW COMPARISON:  Chest from acute abdomen 10/21/2016 FINDINGS: Very low lung volumes limit assessment. Persistent patchy right basilar opacity, partially obscured. Progressive left perihilar opacities. Unchanged heart size and mediastinal contours. No evidence of pneumothorax or pleural fluid. IMPRESSION: Very low lung volumes. Persistent right basilar opacity, partially obscured. Development of left perihilar opacities, may be atelectasis. Recommend correlation for aspiration. Electronically Signed   By: Jeb Levering M.D.   On: 10/23/2016 22:00   Dg Abd Portable 1v  Result Date: 10/23/2016 CLINICAL DATA:  Abdominal distention. EXAM: PORTABLE ABDOMEN - 1 VIEW COMPARISON:  Abdominal radiographs yesterday. FINDINGS: Air-filled colon appears similar to prior exam. Probable  gaseous small bowel dilatation in the left abdomen. No gross evidence of free air on supine views. IMPRESSION: Probable gaseous small bowel distention in the left mid abdomen, favoring ileus over obstruction. Air-filled colon is similar. Electronically Signed   By: Jeb Levering M.D.   On: 10/23/2016 22:02   Ct Image Guided Drainage By Percutaneous Catheter  Result Date: 10/25/2016 CLINICAL DATA:  Development of anterior peritoneal abscess just deep to the abdominal wall 1 week post ventral hernia repair with mesh. EXAM: CT GUIDED CATHETER DRAINAGE OF PERITONEAL ABSCESS ANESTHESIA/SEDATION: 1.0 mg IV Versed 50 mcg IV Fentanyl Total Moderate Sedation Time:  12 minutes The patient's level of consciousness and physiologic status were continuously monitored during the procedure by Radiology nursing. PROCEDURE: The procedure, risks, benefits, and alternatives were explained to the patient. Questions regarding the procedure were encouraged and answered. The patient understands and consents to the procedure. A time out was performed prior to initiating the procedure. The anterior abdominal wall was prepped with chlorhexidine in a sterile fashion, and a sterile drape was applied covering the operative field. A sterile gown and sterile gloves were used for the procedure. Local anesthesia was provided with 1% Lidocaine. From a left anterior  approach, an 18 gauge trocar needle was advanced into the anterior abdominal abscess. Fluid aspiration was performed. A guidewire was advanced into the collection. The tract was dilated over the wire and a 12 French percutaneous drainage catheter placed. Additional fluid was withdrawn from the catheter and sent for culture analysis. The catheter was then flushed and connected to a suction bulb. The drainage catheter was secured at the skin with a Prolene retention suture and StatLock device. COMPLICATIONS: None FINDINGS: Fluid return from the anterior abdominal abscess was a cloudy  orange color and was foul-smelling. After placement of the drain, there was rapid return of fluid. IMPRESSION: CT-guided percutaneous drain placement within anterior abdominal abscess. A 12 French drain was placed and attached to suction bulb drainage. A fluid sample was sent for culture analysis. Electronically Signed   By: Aletta Edouard M.D.   On: 10/25/2016 08:53      [place] M 10/25/2016  Patient ID: Leonard [name], male   DOB: 1976/05/13, 41 y.o.   MRN: [id number]

## 2016-10-25 NOTE — Progress Notes (Signed)
Night float interim progress note.  Patient by RN around 9:30 PM the patient is having right flank pain. According to patient his right lateral abdominal pain has been worsening, aggravated with deep breath. He has to hold breath for 20-30 seconds to get relief. He got morphine around 6:30 PM, with some relief. Denies any nausea or vomiting. Had multiple soft to semisolid bowel movements today. Patient was also refusing Lasix at this time.  Exam. Temperature 100.3, blood pressure 114/88, heart rate 115, respiratory rate 22, oxygen saturation 100% on room air. Gen. Looks mild to moderate in distress, standing on the side of bed with the help of bed table. Chest. Clear bilaterally, no crackles, rhonchi or wheeze. CVS. Regular rate and rhythm. Abdomen. Soft, edema and tenderness along  right lateral abdominal wall, ventral drain in place with good drainage, no rebound or guarding, bowel sounds hypoactive. Extremities. No edema, no cyanosis.  A/P. His CT abdomen shows right paracolic 5 cm loculated fluid collection, might be developing another abscess in that area. He does not appear volume overload currently.  -Morphine 2 mg IV. -Might need further imaging to rule out another abscess in right paracolic area.

## 2016-10-25 NOTE — Progress Notes (Signed)
Patient ID: Leonard [name], male   DOB: May 27, 1976, 41 y.o.   MRN: [id number]    Referring Physician(s): Dr. Rolm Bookbinder  Supervising Physician: Aletta Edouard  Patient Status: [facility] - In-pt  Chief Complaint: Abdominal abscess  Subjective: Patient feels better today.  BP is better.  Highest fever today is 100.4.  Allergies: Patient has no known allergies.  Medications: Prior to Admission medications   Medication Sig Start Date End Date Taking? Authorizing Provider  albuterol (PROVENTIL HFA;VENTOLIN HFA) 108 (90 Base) MCG/ACT inhaler Inhale 2 puffs into the lungs every 6 (six) hours as needed for wheezing or shortness of breath. 10/06/16  Yes Alexa Angela Burke, MD  albuterol (PROVENTIL) (2.5 MG/3ML) 0.083% nebulizer solution Take 3 mLs (2.5 mg total) by nebulization every 6 (six) hours as needed for wheezing or shortness of breath. 10/06/16  Yes Alexa Angela Burke, MD  carvedilol (COREG) 12.5 MG tablet Take 1 tablet (12.5 mg total) by mouth 2 (two) times daily with a meal. 09/28/16  Yes Shirley Friar, PA-C  docusate sodium (COLACE) 100 MG capsule Take 100 mg by mouth 2 (two) times daily as needed for mild constipation.   Yes Historical Provider, MD  fluticasone (FLONASE) 50 MCG/ACT nasal spray Place 2 sprays into both nostrils daily. 10/06/16  Yes Alexa Angela Burke, MD  Fluticasone-Salmeterol (ADVAIR) 250-50 MCG/DOSE AEPB Inhale 2 puffs into the lungs 2 (two) times daily. 10/06/16  Yes Alexa Angela Burke, MD  furosemide (LASIX) 40 MG tablet Take 1 tablet (40 mg total) by mouth 2 (two) times daily. 09/14/16 12/13/16 Yes Larey Dresser, MD  gabapentin (NEURONTIN) 800 MG tablet Take 1 tablet (800 mg total) by mouth 3 (three) times daily. 10/06/16  Yes Alexa Angela Burke, MD  guaiFENesin-dextromethorphan (ROBITUSSIN DM) 100-10 MG/5ML syrup Take 5 mLs by mouth every 4 (four) hours as needed for cough. 10/06/16  Yes Alexa Angela Burke, MD  magnesium citrate SOLN Take 0.5-1 Bottles by mouth once as needed for mild  constipation.   Yes Historical Provider, MD  pantoprazole (PROTONIX) 40 MG tablet Take 1 tablet (40 mg total) by mouth daily. 10/06/16  Yes Alexa Angela Burke, MD  potassium chloride SA (K-DUR,KLOR-CON) 20 MEQ tablet Take 40 mEq by mouth daily.   Yes Historical Provider, MD  psyllium (METAMUCIL) 58.6 % packet Take 1 packet by mouth daily as needed (for constipation).   Yes Historical Provider, MD  sacubitril-valsartan (ENTRESTO) 49-51 MG Take 1 tablet by mouth 2 (two) times daily. 09/14/16  Yes Larey Dresser, MD  spironolactone (ALDACTONE) 25 MG tablet TAKE 1/2 TABLET BY MOUTH EACH DAY 09/03/16  Yes Amy D Clegg, NP  acetaminophen (TYLENOL) 500 MG tablet Take 1 tablet (500 mg total) by mouth every 6 (six) hours as needed. Patient not taking: Reported on 10/21/2016 01/01/16   Marella Chimes, PA-C  Multiple Vitamins-Minerals (MULTIVITAMIN WITH MINERALS) tablet Take 1 tablet by mouth daily.    Historical Provider, MD    Vital Signs: BP 120/65   Pulse 93   Temp 98.2 F (36.8 C) (Oral)   Resp 18   Ht 6' (1.829 m)   Wt 240 lb 15.4 oz (109.3 kg)   SpO2 99%   BMI 32.68 kg/m   Physical Exam: Abd: soft, less tender, drain with close to 600cc of light brown, purulent output.  Noted to be foul-smelling.  Drain site is c/d/i  Imaging: Ct Abdomen Pelvis Wo Contrast  Result Date: 10/24/2016 CLINICAL DATA:  Right-sided abdominal pain. Fever since hernia repair.  EXAM: CT ABDOMEN AND PELVIS WITHOUT CONTRAST TECHNIQUE: Multidetector CT imaging of the abdomen and pelvis was performed following the standard protocol without IV contrast. COMPARISON:  Abdominal CT from 10/17/2016 FINDINGS: Lower chest:  Low-density blood pool consistent with anemia. Band of opacity in the right lower lobe with internal calcifications is stable to mildly decreased in bulk compared to prior. Trace right pleural fluid. Hepatobiliary: Subcentimeter low-density in the central liver is too small for densitometry. There could be layering  material within the gallbladder but no calcified gallstone. No inflammatory features. Pancreas: Unremarkable. Spleen: Unremarkable. Adrenals/Urinary Tract: Negative adrenals. No hydronephrosis or stone. Defect in the inferior pole left and upper pole right renal cortex attributed to scarring. Successful decompression of baldder by Foley catheter. Stomach/Bowel: Oral contrast reaches the hepatic flexure. No signs of ileus or obstruction. 15 x 6 x 15 cm gas and fluid collection in the ventral abdomen, extending into the recently repaired umbilical hernia. The neighboring omentum is reticulated and there is a discrete wall. Small volume fluid in the low pelvis and right paracolic gutter. Right paracolic fluid appears loculated and measures up to 5 cm in diameter. Small pneumoperitoneum, not unexpected. No extravasation of oral contrast. Vascular/Lymphatic: No acute vascular abnormality. No mass or adenopathy. Reproductive:No pathologic findings. Musculoskeletal: No acute abnormalities. 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 IMPRESSION: 1. 15 x 5 x 15 cm gas and fluid collection in the ventral abdomen and umbilical hernia site consistent with abscess. 2. Small fluid only collections in the right paracolic gutter and pelvis. 3. No postoperative ileus or obstruction. 4. Bandlike opacity with calcifications in the right lower lobe is stable to decreased in bulk compared to 10/17/2016. Chest CT follow-up is again recommended, suggest waiting 6-8 weeks to allow for resolution of active inflammation. Electronically Signed   By: Monte Fantasia M.D.   On: 10/24/2016 13:29   Dg Abd 1 View  Result Date: 10/22/2016 CLINICAL DATA:  Hernia repair.  Constipation . EXAM: ABDOMEN - 1 VIEW COMPARISON:  10/21/2016. FINDINGS: Soft tissue structures are unremarkable. Moderate stool volume. No prominent bowel distention. Several nondistended air-filled loops of small bowel are noted. No free air. No acute bony abnormality. IMPRESSION:  Moderate stool volume.  No bowel distention. Electronically Signed   By: Marcello Moores  Register   On: 10/22/2016 08:32   Dg Chest Port 1 View  Result Date: 10/23/2016 CLINICAL DATA:  Fever and abdominal distention. EXAM: PORTABLE CHEST 1 VIEW COMPARISON:  Chest from acute abdomen 10/21/2016 FINDINGS: Very low lung volumes limit assessment. Persistent patchy right basilar opacity, partially obscured. Progressive left perihilar opacities. Unchanged heart size and mediastinal contours. No evidence of pneumothorax or pleural fluid. IMPRESSION: Very low lung volumes. Persistent right basilar opacity, partially obscured. Development of left perihilar opacities, may be atelectasis. Recommend correlation for aspiration. Electronically Signed   By: Jeb Levering M.D.   On: 10/23/2016 22:00   Dg Abd Acute W/chest  Result Date: 10/21/2016 CLINICAL DATA:  Severe abdominal pain for several days, no bowel movement for more than 1 week, history of recent hernia surgery EXAM: DG ABDOMEN ACUTE W/ 1V CHEST COMPARISON:  CT abdomen pelvis of 10/17/2016 FINDINGS: The minimal haziness remains at the right lung base, also well seen on the upright view of the abdomen suspicious for patchy pneumonia. Pleuroparenchymal scarring in the left upper lobe near the apex is stable. The heart is within normal limits in size. Supine and erect views of the abdomen show scattered air-fluid levels which may represent ileus versus partial  small bowel obstruction. No significant distention of small bowel loops is seen on the supine film. No opaque calculi are seen. IMPRESSION: 1. Patchy opacity remains at the right lung base suspicious for a focal area of pneumonia. 2. Scattered air-fluid levels throughout small bowel could indicate ileus versus partial SBO. No free air is seen. Electronically Signed   By: Ivar Drape M.D.   On: 10/21/2016 15:16   Dg Abd Portable 1v  Result Date: 10/23/2016 CLINICAL DATA:  Abdominal distention. EXAM: PORTABLE  ABDOMEN - 1 VIEW COMPARISON:  Abdominal radiographs yesterday. FINDINGS: Air-filled colon appears similar to prior exam. Probable gaseous small bowel dilatation in the left abdomen. No gross evidence of free air on supine views. IMPRESSION: Probable gaseous small bowel distention in the left mid abdomen, favoring ileus over obstruction. Air-filled colon is similar. Electronically Signed   By: Jeb Levering M.D.   On: 10/23/2016 22:02   Ct Image Guided Drainage By Percutaneous Catheter  Result Date: 10/25/2016 CLINICAL DATA:  Development of anterior peritoneal abscess just deep to the abdominal wall 1 week post ventral hernia repair with mesh. EXAM: CT GUIDED CATHETER DRAINAGE OF PERITONEAL ABSCESS ANESTHESIA/SEDATION: 1.0 mg IV Versed 50 mcg IV Fentanyl Total Moderate Sedation Time:  12 minutes The patient's level of consciousness and physiologic status were continuously monitored during the procedure by Radiology nursing. PROCEDURE: The procedure, risks, benefits, and alternatives were explained to the patient. Questions regarding the procedure were encouraged and answered. The patient understands and consents to the procedure. A time out was performed prior to initiating the procedure. The anterior abdominal wall was prepped with chlorhexidine in a sterile fashion, and a sterile drape was applied covering the operative field. A sterile gown and sterile gloves were used for the procedure. Local anesthesia was provided with 1% Lidocaine. From a left anterior approach, an 18 gauge trocar needle was advanced into the anterior abdominal abscess. Fluid aspiration was performed. A guidewire was advanced into the collection. The tract was dilated over the wire and a 12 French percutaneous drainage catheter placed. Additional fluid was withdrawn from the catheter and sent for culture analysis. The catheter was then flushed and connected to a suction bulb. The drainage catheter was secured at the skin with a Prolene  retention suture and StatLock device. COMPLICATIONS: None FINDINGS: Fluid return from the anterior abdominal abscess was a cloudy orange color and was foul-smelling. After placement of the drain, there was rapid return of fluid. IMPRESSION: CT-guided percutaneous drain placement within anterior abdominal abscess. A 12 French drain was placed and attached to suction bulb drainage. A fluid sample was sent for culture analysis. Electronically Signed   By: Aletta Edouard M.D.   On: 10/25/2016 08:53    Labs:  CBC:  Recent Labs  10/22/16 0659 10/23/16 1520 10/24/16 0317 10/25/16 0830  WBC 8.4 10.8* 14.0* 7.9  HGB 9.7* 8.3* 8.6* 7.7*  HCT 33.2* 26.9* 28.0* 25.1*  PLT 654* 446* 470* 383    COAGS:  Recent Labs  04/06/16 0209 04/15/16 0709 10/24/16 1415  INR 1.27 1.04 1.30    BMP:  Recent Labs  10/22/16 0611 10/23/16 0422 10/23/16 2155 10/24/16 0317  NA 135 130* 127* 129*  K 4.8 3.5 3.9 4.4  CL 103 94* 92* 95*  CO2 24 24 25 23   GLUCOSE 109* 113* 117* 108*  BUN 11 20 28* 28*  CALCIUM 8.9 8.7* 8.3* 8.1*  CREATININE 1.13 1.63* 2.42* 2.22*  GFRNONAA >60 52* 32* 36*  GFRAA >60 >60  37* 41*    LIVER FUNCTION TESTS:  Recent Labs  04/05/16 2131 10/17/16 1711 10/21/16 1429  BILITOT 0.4 0.3 0.5  AST 27 23 17   ALT 23 15* 13*  ALKPHOS 91 69 63  PROT 6.7 7.7 7.5  ALBUMIN 3.2* 3.3* 3.1*    Assessment and Plan: 1. Intra-abdominal abscess, s/p perc drain on 3/3 -cultures are pending -patient feels better with drain in place -will follow drain and surgery recommendations -cont drain irrigations  Electronically Signed: Pradeep Beaubrun E 10/25/2016, 9:54 AM   I spent a total of 15 Minutes at the the patient's bedside AND on the patient's hospital floor or unit, greater than 50% of which was counseling/coordinating care for intra-abdominal abscess

## 2016-10-25 NOTE — Progress Notes (Signed)
S: Paged by RN at approximately 4pm that patient was having right-sided chest pain pain with inspiration. Patient requests to see MD. Patient reports new information that he woke-up this morning with right lateral abdominal pain which has been slowly progressing. Worse with inspiration - held breath for about 30 seconds and was pain-free. Feels like he is unable to take deep breaths due to the pain. He denied any fevers, chills, nausea, vomiting, dizziness, LH, SOB,      O: Vitals: 99.3*, pulse 92, respirations 22, SpO2 99% on RA, BP 112/74 General: Sitting in bedside chair, appears uncomfortable. Not diaphoretic. Taking moderately-rapid shallow breaths, limited due to pain per patient.  Cardiovascular: Regular rate and rhythm. No murmur or rub appreciated. Pulmonary: CTA BL, no wheezing, crackles or rhonchi appreciated. Unlabored breathing. Abdomen: Soft, distended however not tense nor tympanic. Mild pitting edema of abdominal wall. Drain in place with clean dry dressings. Pt indicates pain is overlying the right lateral hip.  Extremities: 1+ pitting edema observed in BL LE.  Skin: Warm, dry. No cyanosis.  Psych: Mood anxious and affect was mood congruent. Responds to questions appropriately.   Physical Exam  Abdominal:     Assessment: This is a 41 year old male with medical history significant for systolic CHF who is admitted for post-op ileus and intra-abdominal abscess s/p perc drain placement. His diuretics have been held for several days in the setting of dehydration and AKI. On exam, patient appears uncomfortable and is taking moderately-rapid and shallow breaths due to right lower flank pain with inspiration. He is volume-up on examination, more-so than this mornings evaluation. This is likely due to several days of an all-liquid diet followed by resuming a regular diet and his diuretics being held. ?If patient has been compliant with hospital diet as pt has hx of dietary indiscretion.  Believe his symptoms are due to volume overload however differential includes pneumonia vs PE.   Plan: Restart diuresis with Lasix 40 mg IV BID. Also have ordered Morphine 2 mg IV and will reassess. Adjust diet from regular to heart healthy in attempt to limit sodium. If patient were to develop hypotension, persistent tachycardia and persistent lateral pain, could consider CTA however patient has AKI.

## 2016-10-26 ENCOUNTER — Inpatient Hospital Stay (HOSPITAL_COMMUNITY): Payer: Medicaid Other

## 2016-10-26 ENCOUNTER — Ambulatory Visit: Payer: Medicaid Other

## 2016-10-26 DIAGNOSIS — T8149XA Infection following a procedure, other surgical site, initial encounter: Secondary | ICD-10-CM

## 2016-10-26 DIAGNOSIS — R509 Fever, unspecified: Secondary | ICD-10-CM

## 2016-10-26 DIAGNOSIS — R000 Tachycardia, unspecified: Secondary | ICD-10-CM

## 2016-10-26 DIAGNOSIS — D72829 Elevated white blood cell count, unspecified: Secondary | ICD-10-CM

## 2016-10-26 DIAGNOSIS — Z9889 Other specified postprocedural states: Secondary | ICD-10-CM

## 2016-10-26 LAB — URINALYSIS, ROUTINE W REFLEX MICROSCOPIC
Bilirubin Urine: NEGATIVE
Glucose, UA: NEGATIVE mg/dL
Ketones, ur: NEGATIVE mg/dL
Leukocytes, UA: NEGATIVE
Nitrite: NEGATIVE
PH: 5 (ref 5.0–8.0)
Protein, ur: NEGATIVE mg/dL
SPECIFIC GRAVITY, URINE: 1.008 (ref 1.005–1.030)

## 2016-10-26 LAB — CBC
HCT: 24.9 % — ABNORMAL LOW (ref 39.0–52.0)
Hemoglobin: 7.7 g/dL — ABNORMAL LOW (ref 13.0–17.0)
MCH: 21.2 pg — AB (ref 26.0–34.0)
MCHC: 30.9 g/dL (ref 30.0–36.0)
MCV: 68.4 fL — AB (ref 78.0–100.0)
PLATELETS: 373 10*3/uL (ref 150–400)
RBC: 3.64 MIL/uL — ABNORMAL LOW (ref 4.22–5.81)
RDW: 17.8 % — AB (ref 11.5–15.5)
WBC: 7.1 10*3/uL (ref 4.0–10.5)

## 2016-10-26 LAB — BASIC METABOLIC PANEL
ANION GAP: 9 (ref 5–15)
BUN: 21 mg/dL — AB (ref 6–20)
CALCIUM: 8.1 mg/dL — AB (ref 8.9–10.3)
CO2: 20 mmol/L — ABNORMAL LOW (ref 22–32)
Chloride: 97 mmol/L — ABNORMAL LOW (ref 101–111)
Creatinine, Ser: 1.81 mg/dL — ABNORMAL HIGH (ref 0.61–1.24)
GFR calc Af Amer: 53 mL/min — ABNORMAL LOW (ref >60)
GFR, EST NON AFRICAN AMERICAN: 46 mL/min — AB (ref >60)
GLUCOSE: 128 mg/dL — AB (ref 65–99)
Potassium: 4.1 mmol/L (ref 3.5–5.1)
Sodium: 126 mmol/L — ABNORMAL LOW (ref 135–145)

## 2016-10-26 MED ORDER — FUROSEMIDE 40 MG PO TABS
40.0000 mg | ORAL_TABLET | Freq: Two times a day (BID) | ORAL | Status: DC
  Administered 2016-10-26: 40 mg via ORAL
  Filled 2016-10-26: qty 1

## 2016-10-26 MED ORDER — FUROSEMIDE 10 MG/ML IJ SOLN
40.0000 mg | Freq: Once | INTRAMUSCULAR | Status: AC
  Administered 2016-10-26: 40 mg via INTRAVENOUS
  Filled 2016-10-26: qty 4

## 2016-10-26 MED ORDER — MORPHINE SULFATE (PF) 2 MG/ML IV SOLN
2.0000 mg | Freq: Once | INTRAVENOUS | Status: AC
  Administered 2016-10-26: 2 mg via INTRAVENOUS
  Filled 2016-10-26: qty 1

## 2016-10-26 NOTE — Care Management Note (Signed)
Case Management Note  Patient Details  Name: Leonard [name] MRN: [id number] Date of Birth: [birth year]  Subjective/Objective:   Pt may discharge with drain - and he is comfortable with the care - states his wife is a CNA and helps him with everything - she will assist as needed.                Expected Discharge Plan:  Home/Self Care  Discharge planning Services  CM Consult  Status of Service:  Completed, signed off  Girard Cooter, [place] 10/26/2016, 3:52 PM

## 2016-10-26 NOTE — Progress Notes (Signed)
Pharmacy Antibiotic Note  Leonard [name] is a 41 y.o. male admitted on 10/21/2016 with an abdominal wall infection.  Pharmacy has been consulted for Zosyn dosing. He is on Day #6 of antibiotics, day #4 of Zosyn. His abscess culture is growing Gram negative rods. Zosyn is a good empiric choice and can possibly be narrowed when the organism is identified and susceptibilities are available.  Plan: Continue Zosyn 3.375g IV q8h extended infusion Await ID and susceptibilities on abscess culture Monitor renal function  Height: 6' (182.9 cm) Weight: 240 lb 15.4 oz (109.3 kg) IBW/kg (Calculated) : 77.6  Temp (24hrs), Avg:100.1 F (37.8 C), Min:98.1 F (36.7 C), Max:103 F (39.4 C)   Recent Labs Lab 10/21/16 1451  10/22/16 0659 10/23/16 0422 10/23/16 1520 10/23/16 2155 10/24/16 0317 10/25/16 0830 10/26/16 0213  WBC  --   --  8.4  --  10.8*  --  14.0* 7.9 7.1  CREATININE  --   < >  --  1.63*  --  2.42* 2.22* 2.14* 1.81*  LATICACIDVEN 1.16  --   --   --  1.7  --   --   --   --   VANCORANDOM  --   --   --   --   --  19  --   --   --   < > = values in this interval not displayed.  Estimated Creatinine Clearance: 70.7 mL/min (by C-G formula based on SCr of 1.81 mg/dL (H)).    No Known Allergies   Thank you for allowing pharmacy to be a part of this patient's care.  Legrand Como, Pharm.D., BCPS, AAHIVP Clinical Pharmacist Phone: [phone] or [phone] Pager: [phone] 10/26/2016, 10:43 AM

## 2016-10-26 NOTE — Progress Notes (Signed)
Patient was having difficulty urinating after receiving lasix IV. He was experiencing discomfort. MD notified and order for foley obtained. When I came back into the room, patient has voided 850 cc. Foley was not placed at this time.

## 2016-10-26 NOTE — Progress Notes (Addendum)
Referring Physician(s): Dr. Rolm Bookbinder  Supervising Physician: Corrie Mckusick  Patient Status: [facility] - In-pt  Chief Complaint:  Abdominal abscess  Subjective: Patient nauseated at time of visit- states due to recent pain medication. Reports he is eating well. Temp elevated this AM.   Allergies: Patient has no known allergies.  Medications: Prior to Admission medications   Medication Sig Start Date End Date Taking? Authorizing Provider  albuterol (PROVENTIL HFA;VENTOLIN HFA) 108 (90 Base) MCG/ACT inhaler Inhale 2 puffs into the lungs every 6 (six) hours as needed for wheezing or shortness of breath. 10/06/16  Yes Alexa Angela Burke, MD  albuterol (PROVENTIL) (2.5 MG/3ML) 0.083% nebulizer solution Take 3 mLs (2.5 mg total) by nebulization every 6 (six) hours as needed for wheezing or shortness of breath. 10/06/16  Yes Alexa Angela Burke, MD  carvedilol (COREG) 12.5 MG tablet Take 1 tablet (12.5 mg total) by mouth 2 (two) times daily with a meal. 09/28/16  Yes Shirley Friar, PA-C  docusate sodium (COLACE) 100 MG capsule Take 100 mg by mouth 2 (two) times daily as needed for mild constipation.   Yes Historical Provider, MD  fluticasone (FLONASE) 50 MCG/ACT nasal spray Place 2 sprays into both nostrils daily. 10/06/16  Yes Alexa Angela Burke, MD  Fluticasone-Salmeterol (ADVAIR) 250-50 MCG/DOSE AEPB Inhale 2 puffs into the lungs 2 (two) times daily. 10/06/16  Yes Alexa Angela Burke, MD  furosemide (LASIX) 40 MG tablet Take 1 tablet (40 mg total) by mouth 2 (two) times daily. 09/14/16 12/13/16 Yes Larey Dresser, MD  gabapentin (NEURONTIN) 800 MG tablet Take 1 tablet (800 mg total) by mouth 3 (three) times daily. 10/06/16  Yes Alexa Angela Burke, MD  guaiFENesin-dextromethorphan (ROBITUSSIN DM) 100-10 MG/5ML syrup Take 5 mLs by mouth every 4 (four) hours as needed for cough. 10/06/16  Yes Alexa Angela Burke, MD  magnesium citrate SOLN Take 0.5-1 Bottles by mouth once as needed for mild constipation.   Yes Historical  Provider, MD  pantoprazole (PROTONIX) 40 MG tablet Take 1 tablet (40 mg total) by mouth daily. 10/06/16  Yes Alexa Angela Burke, MD  potassium chloride SA (K-DUR,KLOR-CON) 20 MEQ tablet Take 40 mEq by mouth daily.   Yes Historical Provider, MD  psyllium (METAMUCIL) 58.6 % packet Take 1 packet by mouth daily as needed (for constipation).   Yes Historical Provider, MD  sacubitril-valsartan (ENTRESTO) 49-51 MG Take 1 tablet by mouth 2 (two) times daily. 09/14/16  Yes Larey Dresser, MD  spironolactone (ALDACTONE) 25 MG tablet TAKE 1/2 TABLET BY MOUTH EACH DAY 09/03/16  Yes Amy D Clegg, NP  acetaminophen (TYLENOL) 500 MG tablet Take 1 tablet (500 mg total) by mouth every 6 (six) hours as needed. Patient not taking: Reported on 10/21/2016 01/01/16   Marella Chimes, PA-C  Multiple Vitamins-Minerals (MULTIVITAMIN WITH MINERALS) tablet Take 1 tablet by mouth daily.    Historical Provider, MD    Vital Signs: BP 137/73 (BP Location: Left Arm)   Pulse (!) 115   Temp 97.7 F (36.5 C) (Oral)   Resp (!) 31   Ht 6' (1.829 m)   Wt 240 lb 15.4 oz (109.3 kg)   SpO2 96%   BMI 32.68 kg/m   Physical Exam: Abd: soft, less tender, drain with close to 600cc of light brown, purulent output.  Noted to be foul-smelling.  Drain site is c/d/i  Imaging: Ct Abdomen Pelvis Wo Contrast  Result Date: 10/26/2016 CLINICAL DATA:  Worsening right-sided pain. Patient has drain for abscess  status post hernia repair. History of non-Hodgkin's lymphoma. EXAM: CT ABDOMEN AND PELVIS WITHOUT CONTRAST TECHNIQUE: Multidetector CT imaging of the abdomen and pelvis was performed following the standard protocol without IV contrast. COMPARISON:  10/17/2016 CT FINDINGS: Lower chest: Elliptical masslike consolidation with irregular borders and a few scattered punctate calcifications seen within measures approximate 3.5 x 1.9 cm in the periphery of the right lower lobe, grossly unchanged. No effusion. Hepatobiliary: Stable 4 mm right hepatic  hypodensity too small to further characterize but statistically consistent with a cyst or hemangioma. No biliary dilatation. Gallbladder is physiologic in appearance without calculi. Pancreas: Unremarkable. No pancreatic ductal dilatation or surrounding inflammatory changes. Spleen: Normal in size without focal abnormality. Adrenals/Urinary Tract: Adrenal glands are unremarkable. Small 2.5 cm focus of fat along the periphery of the left interpolar kidney possibly an angiomyolipoma or cortical scarring. Bladder is unremarkable. Stomach/Bowel: No bowel obstruction. Enteric contrast noted admixed with stool in the right and left colon. Stomach is nondistended. There is normal small bowel rotation. Vascular/Lymphatic: No significant vascular findings are present. No enlarged abdominal or pelvic lymph nodes. Reproductive: Prostate is unremarkable. Other: There is a new pigtail percutaneous drainage catheter which has decompressed study lower abdominal abscess collection since prior exam. Some postprocedural air is noted deep to the umbilicus and at site of prior abscess cavity. Mild diffuse subcutaneous induration is noted along the ventral abdominal wall of the lower abdomen and pelvis. No abdominopelvic ascites. Musculoskeletal: No acute or significant osseous findings. IMPRESSION: 1. Percutaneous left lower quadrant approach drainage catheter occupies site of prior lower abdominal abscess cavity. No significant reaccumulation of fluid, abscess nor hematoma noted. 2. Stable typical masslike consolidation with irregular borders and scattered punctate calcifications in the right lower lobe possibly residual from patient's history of non-Hodgkin's lymphoma. This warrants follow-up. 3. Mild diffuse subcutaneous edema/anasarca of the lower abdominal wall and pelvis. 4. Tiny hepatic hypodensity too small to further characterize statistically consistent with a cyst or hemangioma. 5. Small focus of fat possibly representing an  intra mild lipoma of the left interpolar kidney versus cortical scarring. Electronically Signed   By: Ashley Royalty M.D.   On: 10/26/2016 02:02   Ct Abdomen Pelvis Wo Contrast  Result Date: 10/24/2016 CLINICAL DATA:  Right-sided abdominal pain. Fever since hernia repair. EXAM: CT ABDOMEN AND PELVIS WITHOUT CONTRAST TECHNIQUE: Multidetector CT imaging of the abdomen and pelvis was performed following the standard protocol without IV contrast. COMPARISON:  Abdominal CT from 10/17/2016 FINDINGS: Lower chest:  Low-density blood pool consistent with anemia. Band of opacity in the right lower lobe with internal calcifications is stable to mildly decreased in bulk compared to prior. Trace right pleural fluid. Hepatobiliary: Subcentimeter low-density in the central liver is too small for densitometry. There could be layering material within the gallbladder but no calcified gallstone. No inflammatory features. Pancreas: Unremarkable. Spleen: Unremarkable. Adrenals/Urinary Tract: Negative adrenals. No hydronephrosis or stone. Defect in the inferior pole left and upper pole right renal cortex attributed to scarring. Successful decompression of baldder by Foley catheter. Stomach/Bowel: Oral contrast reaches the hepatic flexure. No signs of ileus or obstruction. 15 x 6 x 15 cm gas and fluid collection in the ventral abdomen, extending into the recently repaired umbilical hernia. The neighboring omentum is reticulated and there is a discrete wall. Small volume fluid in the low pelvis and right paracolic gutter. Right paracolic fluid appears loculated and measures up to 5 cm in diameter. Small pneumoperitoneum, not unexpected. No extravasation of oral contrast. Vascular/Lymphatic: No acute  vascular abnormality. No mass or adenopathy. Reproductive:No pathologic findings. Musculoskeletal: No acute abnormalities. 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 IMPRESSION: 1. 15 x 5 x 15 cm gas and fluid collection in the ventral abdomen  and umbilical hernia site consistent with abscess. 2. Small fluid only collections in the right paracolic gutter and pelvis. 3. No postoperative ileus or obstruction. 4. Bandlike opacity with calcifications in the right lower lobe is stable to decreased in bulk compared to 10/17/2016. Chest CT follow-up is again recommended, suggest waiting 6-8 weeks to allow for resolution of active inflammation. Electronically Signed   By: Monte Fantasia M.D.   On: 10/24/2016 13:29   Dg Chest Port 1 View  Result Date: 10/23/2016 CLINICAL DATA:  Fever and abdominal distention. EXAM: PORTABLE CHEST 1 VIEW COMPARISON:  Chest from acute abdomen 10/21/2016 FINDINGS: Very low lung volumes limit assessment. Persistent patchy right basilar opacity, partially obscured. Progressive left perihilar opacities. Unchanged heart size and mediastinal contours. No evidence of pneumothorax or pleural fluid. IMPRESSION: Very low lung volumes. Persistent right basilar opacity, partially obscured. Development of left perihilar opacities, may be atelectasis. Recommend correlation for aspiration. Electronically Signed   By: Jeb Levering M.D.   On: 10/23/2016 22:00   Dg Abd Portable 1v  Result Date: 10/23/2016 CLINICAL DATA:  Abdominal distention. EXAM: PORTABLE ABDOMEN - 1 VIEW COMPARISON:  Abdominal radiographs yesterday. FINDINGS: Air-filled colon appears similar to prior exam. Probable gaseous small bowel dilatation in the left abdomen. No gross evidence of free air on supine views. IMPRESSION: Probable gaseous small bowel distention in the left mid abdomen, favoring ileus over obstruction. Air-filled colon is similar. Electronically Signed   By: Jeb Levering M.D.   On: 10/23/2016 22:02   Ct Image Guided Drainage By Percutaneous Catheter  Result Date: 10/25/2016 CLINICAL DATA:  Development of anterior peritoneal abscess just deep to the abdominal wall 1 week post ventral hernia repair with mesh. EXAM: CT GUIDED CATHETER DRAINAGE OF  PERITONEAL ABSCESS ANESTHESIA/SEDATION: 1.0 mg IV Versed 50 mcg IV Fentanyl Total Moderate Sedation Time:  12 minutes The patient's level of consciousness and physiologic status were continuously monitored during the procedure by Radiology nursing. PROCEDURE: The procedure, risks, benefits, and alternatives were explained to the patient. Questions regarding the procedure were encouraged and answered. The patient understands and consents to the procedure. A time out was performed prior to initiating the procedure. The anterior abdominal wall was prepped with chlorhexidine in a sterile fashion, and a sterile drape was applied covering the operative field. A sterile gown and sterile gloves were used for the procedure. Local anesthesia was provided with 1% Lidocaine. From a left anterior approach, an 18 gauge trocar needle was advanced into the anterior abdominal abscess. Fluid aspiration was performed. A guidewire was advanced into the collection. The tract was dilated over the wire and a 12 French percutaneous drainage catheter placed. Additional fluid was withdrawn from the catheter and sent for culture analysis. The catheter was then flushed and connected to a suction bulb. The drainage catheter was secured at the skin with a Prolene retention suture and StatLock device. COMPLICATIONS: None FINDINGS: Fluid return from the anterior abdominal abscess was a cloudy orange color and was foul-smelling. After placement of the drain, there was rapid return of fluid. IMPRESSION: CT-guided percutaneous drain placement within anterior abdominal abscess. A 12 French drain was placed and attached to suction bulb drainage. A fluid sample was sent for culture analysis. Electronically Signed   By: Aletta Edouard M.D.   On:  10/25/2016 08:53    Labs:  CBC:  Recent Labs  10/23/16 1520 10/24/16 0317 10/25/16 0830 10/26/16 0213  WBC 10.8* 14.0* 7.9 7.1  HGB 8.3* 8.6* 7.7* 7.7*  HCT 26.9* 28.0* 25.1* 24.9*  PLT 446* 470*  383 373    COAGS:  Recent Labs  04/06/16 0209 04/15/16 0709 10/24/16 1415  INR 1.27 1.04 1.30    BMP:  Recent Labs  10/23/16 2155 10/24/16 0317 10/25/16 0830 10/26/16 0213  NA 127* 129* 129* 126*  K 3.9 4.4 3.6 4.1  CL 92* 95* 99* 97*  CO2 25 23 21* 20*  GLUCOSE 117* 108* 100* 128*  BUN 28* 28* 26* 21*  CALCIUM 8.3* 8.1* 8.1* 8.1*  CREATININE 2.42* 2.22* 2.14* 1.81*  GFRNONAA 32* 36* 37* 46*  GFRAA 37* 41* 43* 53*    LIVER FUNCTION TESTS:  Recent Labs  04/05/16 2131 10/17/16 1711 10/21/16 1429  BILITOT 0.4 0.3 0.5  AST 27 23 17   ALT 23 15* 13*  ALKPHOS 91 69 63  PROT 6.7 7.7 7.5  ALBUMIN 3.2* 3.3* 3.1*    Assessment and Plan: Intra-abdominal abscess, s/p perc drain on 3/3 Cultures are pending.  Continues IV Zosyn. Still with increased drain output.  Febrile overnight. Continue routine drain care. IR to follow. Plan per surgery.   Electronically Signed: Docia Barrier 10/26/2016, 2:03 PM   I spent a total of 15 Minutes at the the patient's bedside AND on the patient's hospital floor or unit, greater than 50% of which was counseling/coordinating care for intra-abdominal abscess

## 2016-10-26 NOTE — Progress Notes (Signed)
[facility] Progress Note     Subjective: Pt states his abdominal pain has greatly improved. He denies nausea, vomiting. Still having BM's. Resting comfortably. Asking when he can go home.   Objective: Vital signs in last 24 hours: Temp:  [98.2 F (36.8 C)-103 F (39.4 C)] 99.3 F (37.4 C) (03/05 0700) Pulse Rate:  [87-132] 115 (03/05 0700) Resp:  [18-31] 31 (03/05 0700) BP: (95-127)/(65-94) 107/74 (03/05 0700) SpO2:  [97 %-100 %] 97 % (03/05 0700) Last BM Date: 10/25/16  Intake/Output from previous day: 03/04 0701 - 03/05 0700 In: 350 [P.O.:240; IV Piggyback:100] Out: 1650 [Urine:1550; Drains:100] Intake/Output this shift: Total I/O In: 26 [IV Piggyback:50] Out: 400 [Urine:400]  PE: Gen:  Alert, NAD, pleasant, cooperative, sitting in chair sleeping Card:  Regular rhythm, mildly tachycardic, systolic murmur noted Pulm:  Rate and effort normal Abd: distended, obese, hypoactive BS, mild TTP to suprapubic region, drain site C/D/I, drain with tan purulent drainage, incisions appear well healing without signs of infection. Area of ecchymosis noted to lower right abdomen. Skin: no rashes noted, diaphoretic, warm  Lab Results:   Recent Labs  10/25/16 0830 10/26/16 0213  WBC 7.9 7.1  HGB 7.7* 7.7*  HCT 25.1* 24.9*  PLT 383 373   BMET  Recent Labs  10/25/16 0830 10/26/16 0213  NA 129* 126*  K 3.6 4.1  CL 99* 97*  CO2 21* 20*  GLUCOSE 100* 128*  BUN 26* 21*  CREATININE 2.14* 1.81*  CALCIUM 8.1* 8.1*   PT/INR  Recent Labs  10/24/16 1415  LABPROT 16.3*  INR 1.30   CMP     Component Value Date/Time   NA 126 (L) 10/26/2016 0213   K 4.1 10/26/2016 0213   CL 97 (L) 10/26/2016 0213   CO2 20 (L) 10/26/2016 0213   GLUCOSE 128 (H) 10/26/2016 0213   BUN 21 (H) 10/26/2016 0213   CREATININE 1.81 (H) 10/26/2016 0213   CALCIUM 8.1 (L) 10/26/2016 0213   PROT 7.5 10/21/2016 1429   ALBUMIN 3.1 (L) 10/21/2016 1429   AST 17 10/21/2016 1429   ALT 13  (L) 10/21/2016 1429   ALKPHOS 63 10/21/2016 1429   BILITOT 0.5 10/21/2016 1429   GFRNONAA 46 (L) 10/26/2016 0213   GFRAA 53 (L) 10/26/2016 0213   Lipase     Component Value Date/Time   LIPASE 34 10/17/2016 1711       Studies/Results: Ct Abdomen Pelvis Wo Contrast  Result Date: 10/26/2016 CLINICAL DATA:  Worsening right-sided pain. Patient has drain for abscess status post hernia repair. History of non-Hodgkin's lymphoma. EXAM: CT ABDOMEN AND PELVIS WITHOUT CONTRAST TECHNIQUE: Multidetector CT imaging of the abdomen and pelvis was performed following the standard protocol without IV contrast. COMPARISON:  10/17/2016 CT FINDINGS: Lower chest: Elliptical masslike consolidation with irregular borders and a few scattered punctate calcifications seen within measures approximate 3.5 x 1.9 cm in the periphery of the right lower lobe, grossly unchanged. No effusion. Hepatobiliary: Stable 4 mm right hepatic hypodensity too small to further characterize but statistically consistent with a cyst or hemangioma. No biliary dilatation. Gallbladder is physiologic in appearance without calculi. Pancreas: Unremarkable. No pancreatic ductal dilatation or surrounding inflammatory changes. Spleen: Normal in size without focal abnormality. Adrenals/Urinary Tract: Adrenal glands are unremarkable. Small 2.5 cm focus of fat along the periphery of the left interpolar kidney possibly an angiomyolipoma or cortical scarring. Bladder is unremarkable. Stomach/Bowel: No bowel obstruction. Enteric contrast noted admixed with stool in the right and left colon. Stomach is nondistended.  There is normal small bowel rotation. Vascular/Lymphatic: No significant vascular findings are present. No enlarged abdominal or pelvic lymph nodes. Reproductive: Prostate is unremarkable. Other: There is a new pigtail percutaneous drainage catheter which has decompressed study lower abdominal abscess collection since prior exam. Some postprocedural air  is noted deep to the umbilicus and at site of prior abscess cavity. Mild diffuse subcutaneous induration is noted along the ventral abdominal wall of the lower abdomen and pelvis. No abdominopelvic ascites. Musculoskeletal: No acute or significant osseous findings. IMPRESSION: 1. Percutaneous left lower quadrant approach drainage catheter occupies site of prior lower abdominal abscess cavity. No significant reaccumulation of fluid, abscess nor hematoma noted. 2. Stable typical masslike consolidation with irregular borders and scattered punctate calcifications in the right lower lobe possibly residual from patient's history of non-Hodgkin's lymphoma. This warrants follow-up. 3. Mild diffuse subcutaneous edema/anasarca of the lower abdominal wall and pelvis. 4. Tiny hepatic hypodensity too small to further characterize statistically consistent with a cyst or hemangioma. 5. Small focus of fat possibly representing an intra mild lipoma of the left interpolar kidney versus cortical scarring. Electronically Signed   By: Ashley Royalty M.D.   On: 10/26/2016 02:02   Ct Abdomen Pelvis Wo Contrast  Result Date: 10/24/2016 CLINICAL DATA:  Right-sided abdominal pain. Fever since hernia repair. EXAM: CT ABDOMEN AND PELVIS WITHOUT CONTRAST TECHNIQUE: Multidetector CT imaging of the abdomen and pelvis was performed following the standard protocol without IV contrast. COMPARISON:  Abdominal CT from 10/17/2016 FINDINGS: Lower chest:  Low-density blood pool consistent with anemia. Band of opacity in the right lower lobe with internal calcifications is stable to mildly decreased in bulk compared to prior. Trace right pleural fluid. Hepatobiliary: Subcentimeter low-density in the central liver is too small for densitometry. There could be layering material within the gallbladder but no calcified gallstone. No inflammatory features. Pancreas: Unremarkable. Spleen: Unremarkable. Adrenals/Urinary Tract: Negative adrenals. No  hydronephrosis or stone. Defect in the inferior pole left and upper pole right renal cortex attributed to scarring. Successful decompression of baldder by Foley catheter. Stomach/Bowel: Oral contrast reaches the hepatic flexure. No signs of ileus or obstruction. 15 x 6 x 15 cm gas and fluid collection in the ventral abdomen, extending into the recently repaired umbilical hernia. The neighboring omentum is reticulated and there is a discrete wall. Small volume fluid in the low pelvis and right paracolic gutter. Right paracolic fluid appears loculated and measures up to 5 cm in diameter. Small pneumoperitoneum, not unexpected. No extravasation of oral contrast. Vascular/Lymphatic: No acute vascular abnormality. No mass or adenopathy. Reproductive:No pathologic findings. Musculoskeletal: No acute abnormalities. 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 IMPRESSION: 1. 15 x 5 x 15 cm gas and fluid collection in the ventral abdomen and umbilical hernia site consistent with abscess. 2. Small fluid only collections in the right paracolic gutter and pelvis. 3. No postoperative ileus or obstruction. 4. Bandlike opacity with calcifications in the right lower lobe is stable to decreased in bulk compared to 10/17/2016. Chest CT follow-up is again recommended, suggest waiting 6-8 weeks to allow for resolution of active inflammation. Electronically Signed   By: Monte Fantasia M.D.   On: 10/24/2016 13:29   Ct Image Guided Drainage By Percutaneous Catheter  Result Date: 10/25/2016 CLINICAL DATA:  Development of anterior peritoneal abscess just deep to the abdominal wall 1 week post ventral hernia repair with mesh. EXAM: CT GUIDED CATHETER DRAINAGE OF PERITONEAL ABSCESS ANESTHESIA/SEDATION: 1.0 mg IV Versed 50 mcg IV Fentanyl Total Moderate Sedation Time:  12  minutes The patient's level of consciousness and physiologic status were continuously monitored during the procedure by Radiology nursing. PROCEDURE: The procedure, risks,  benefits, and alternatives were explained to the patient. Questions regarding the procedure were encouraged and answered. The patient understands and consents to the procedure. A time out was performed prior to initiating the procedure. The anterior abdominal wall was prepped with chlorhexidine in a sterile fashion, and a sterile drape was applied covering the operative field. A sterile gown and sterile gloves were used for the procedure. Local anesthesia was provided with 1% Lidocaine. From a left anterior approach, an 18 gauge trocar needle was advanced into the anterior abdominal abscess. Fluid aspiration was performed. A guidewire was advanced into the collection. The tract was dilated over the wire and a 12 French percutaneous drainage catheter placed. Additional fluid was withdrawn from the catheter and sent for culture analysis. The catheter was then flushed and connected to a suction bulb. The drainage catheter was secured at the skin with a Prolene retention suture and StatLock device. COMPLICATIONS: None FINDINGS: Fluid return from the anterior abdominal abscess was a cloudy orange color and was foul-smelling. After placement of the drain, there was rapid return of fluid. IMPRESSION: CT-guided percutaneous drain placement within anterior abdominal abscess. A 12 French drain was placed and attached to suction bulb drainage. A fluid sample was sent for culture analysis. Electronically Signed   By: Aletta Edouard M.D.   On: 10/25/2016 08:53    Anti-infectives: Anti-infectives    Start     Dose/Rate Route Frequency Ordered Stop   10/24/16 0000  vancomycin (VANCOCIN) 1,250 mg in sodium chloride 0.9 % 250 mL IVPB  Status:  Discontinued     1,250 mg 166.7 mL/hr over 90 Minutes Intravenous Every 24 hours 10/23/16 2351 10/25/16 0720   10/23/16 2230  piperacillin-tazobactam (ZOSYN) IVPB 3.375 g  Status:  Discontinued     3.375 g 100 mL/hr over 30 Minutes Intravenous  Once 10/23/16 2132 10/23/16 2145    10/23/16 2200  piperacillin-tazobactam (ZOSYN) IVPB 3.375 g     3.375 g 12.5 mL/hr over 240 Minutes Intravenous Every 8 hours 10/23/16 2145     10/23/16 1030  doxycycline (VIBRA-TABS) tablet 100 mg  Status:  Discontinued     100 mg Oral Every 12 hours 10/23/16 1024 10/23/16 2132   10/21/16 1630  vancomycin (VANCOCIN) IVPB 1000 mg/200 mL premix  Status:  Discontinued     1,000 mg 200 mL/hr over 60 Minutes Intravenous Every 8 hours 10/21/16 1605 10/23/16 1024   10/21/16 1600  clindamycin (CLEOCIN) IVPB 600 mg  Status:  Discontinued     600 mg 100 mL/hr over 30 Minutes Intravenous  Once 10/21/16 1548 10/21/16 1554       Assessment/Plan POD#6 - laparoscopic incisionalhernia repair with mesh 10/18/16 by Dr. Kieth Brightly  Intra-abdominal abscess             Percutaneous drainage by IR 3/3  CT scan today shows no reaccumulation of fluid             Culture results pending             continue IV Zosyn  Febrile overnight Postop ileus - resolving  Plan:   regular diet             OOB, ambulation             WBC WNL             Will follow with you  LOS: 5 days    Kalman Drape , [facility] 10/26/2016, 7:44 AM Pager: [phone] Consults: [phone] Mon-Fri 7:00 am-4:30 pm Sat-Sun 7:00 am-11:30 am

## 2016-10-26 NOTE — Progress Notes (Addendum)
   Subjective: Leonard [name] was seen and evaluated today at bedside. Feels better since yesterday afternoon. Reports lateral right abdominal/flank pain has improved significantly and is now only minimally bothersome. Had BM last night around 10 pm. Wants to know when he can go home.   Objective:  Vital signs in last 24 hours: Vitals:   10/26/16 0012 10/26/16 0500 10/26/16 0700 10/26/16 0816  BP: (!) 127/94 117/66 107/74   Pulse: (!) 132 (!) 122 (!) 115   Resp: (!) 31 (!) 28 (!) 31   Temp: 98.7 F (37.1 C) (!) 103 F (39.4 C) 99.3 F (37.4 C)   TempSrc: Oral Oral Oral   SpO2: 100% 99% 97% 96%  Weight:      Height:       General: Sitting in bedside recliner, in no acute distress, eating breakfast vigorously without issue. More comfortable than yesterday afternoons exam. HENT: No conjunctival injection. No ptosis or scleral icterus.  Cardiovascular: Regular rhythm, tachycardic in the 90's. Systolic murmur Pulmonary: CTAB, normal WOB Abdomen: Abdomen is soft. Drain in place in RLQ containing tan/brown purulent fluid. Surgical wounds dry and intact. Mild erythema of right lateral abdominal wall. Tenderness to palpation mid-abdomen. Abdominal edema improved.  Skin: Warm, dry. Trace peripheral edema noted in lower extremities. Many visible tattoos.   Assessment/Plan:  Abdominal wall infection: Was febrile this morning to 103* however most recent temp in chart 99.3* following ibuprofen admin. He has been normotensive and his tachycardia has improved. His right flank/abdominal pain which started yesterday afternoon has improved significantly overnight. He had his last BM at 10 pm last night however does not feel these are related. CT abdomen/pelvis w/o contrast obtained overnight showed moderate stool burden in right colon. Patient has been exceptionally sensitive to discomfort from constipation/gas during admission and suspect this might be related. He is saturating well on RA and breathing was  unlabored.  Drain continues to produce high-output brown/tan purulent fluid. CT from last night showed decompression of the abscess and mild diffuse subQ induration along the ventral and lateral abdominal wall. No other abdominopelvic abscess was noted.   -Awaiting culture and sensitivities of abscess culture. So far growing abundant GNR.  -Continue IV Zosyn (start date 3/2), will narrow based on C&S results -Tylenol prn for fever -Appears to be tolerating HH diet -Repeat CBC and CMET in AM -Dilaudid 1 mg IV Q3H prn severe pain - has only required 1 dose  Post-op Ileus: Improved. Continues to pass multiple BM's per day.  -Laxatives/stool softeners PRN -Have scheduled Simethicone and Protonix -Added Sennakot-S daily as well  AKI: SCr improved today at 1.8, Cr 2.1 yesterday.  -BMET in Am  NICM: Due to radiation from NHL treatment. Trace peripheral edema. Lungs clear. Abdominal wall edema improved. BP on lower end of normal and am inclined to continue holding Entresto, Spironolactone and coreg. Restarted Lasix although IV. Will transition back to PO today.  -PO Lasix 40 mg BID starting this evening -Received IV Lasix 40 this am  Asthma: Advair and albuterol PRN.  Dispo: Discharge in approximately 2-3 days.  Leonard [name], D.O.  Internal Medicine Resident - PGY1 Pager # [phone]

## 2016-10-27 DIAGNOSIS — E876 Hypokalemia: Secondary | ICD-10-CM

## 2016-10-27 DIAGNOSIS — E871 Hypo-osmolality and hyponatremia: Secondary | ICD-10-CM

## 2016-10-27 DIAGNOSIS — I5023 Acute on chronic systolic (congestive) heart failure: Secondary | ICD-10-CM

## 2016-10-27 LAB — BASIC METABOLIC PANEL
Anion gap: 12 (ref 5–15)
BUN: 19 mg/dL (ref 6–20)
CHLORIDE: 93 mmol/L — AB (ref 101–111)
CO2: 28 mmol/L (ref 22–32)
CREATININE: 1.77 mg/dL — AB (ref 0.61–1.24)
Calcium: 8.7 mg/dL — ABNORMAL LOW (ref 8.9–10.3)
GFR calc non Af Amer: 47 mL/min — ABNORMAL LOW (ref >60)
GFR, EST AFRICAN AMERICAN: 55 mL/min — AB (ref >60)
Glucose, Bld: 118 mg/dL — ABNORMAL HIGH (ref 65–99)
Potassium: 3 mmol/L — ABNORMAL LOW (ref 3.5–5.1)
SODIUM: 133 mmol/L — AB (ref 135–145)

## 2016-10-27 LAB — OSMOLALITY, URINE: OSMOLALITY UR: 274 mosm/kg — AB (ref 300–900)

## 2016-10-27 LAB — SODIUM, URINE, RANDOM: Sodium, Ur: 94 mmol/L

## 2016-10-27 LAB — CBC
HCT: 26.9 % — ABNORMAL LOW (ref 39.0–52.0)
HEMOGLOBIN: 8.7 g/dL — AB (ref 13.0–17.0)
MCH: 22 pg — ABNORMAL LOW (ref 26.0–34.0)
MCHC: 32.3 g/dL (ref 30.0–36.0)
MCV: 67.9 fL — ABNORMAL LOW (ref 78.0–100.0)
Platelets: 443 10*3/uL — ABNORMAL HIGH (ref 150–400)
RBC: 3.96 MIL/uL — AB (ref 4.22–5.81)
RDW: 17.7 % — ABNORMAL HIGH (ref 11.5–15.5)
WBC: 9.4 10*3/uL (ref 4.0–10.5)

## 2016-10-27 LAB — OSMOLALITY: OSMOLALITY: 276 mosm/kg (ref 275–295)

## 2016-10-27 LAB — MAGNESIUM: Magnesium: 1.5 mg/dL — ABNORMAL LOW (ref 1.7–2.4)

## 2016-10-27 MED ORDER — BISACODYL 10 MG RE SUPP
10.0000 mg | Freq: Once | RECTAL | Status: DC
  Filled 2016-10-27: qty 1

## 2016-10-27 MED ORDER — CIPROFLOXACIN HCL 500 MG PO TABS
500.0000 mg | ORAL_TABLET | Freq: Two times a day (BID) | ORAL | Status: DC
  Administered 2016-10-27 – 2016-10-28 (×3): 500 mg via ORAL
  Filled 2016-10-27 (×3): qty 1

## 2016-10-27 MED ORDER — MAGNESIUM SULFATE 2 GM/50ML IV SOLN
2.0000 g | Freq: Once | INTRAVENOUS | Status: AC
  Administered 2016-10-27: 2 g via INTRAVENOUS
  Filled 2016-10-27 (×2): qty 50

## 2016-10-27 MED ORDER — POTASSIUM CHLORIDE CRYS ER 20 MEQ PO TBCR
40.0000 meq | EXTENDED_RELEASE_TABLET | Freq: Two times a day (BID) | ORAL | Status: AC
  Administered 2016-10-27 (×2): 40 meq via ORAL
  Filled 2016-10-27 (×2): qty 2

## 2016-10-27 MED ORDER — MORPHINE SULFATE (PF) 2 MG/ML IV SOLN
2.0000 mg | INTRAVENOUS | Status: DC | PRN
  Administered 2016-10-27 (×2): 2 mg via INTRAVENOUS
  Filled 2016-10-27 (×2): qty 1

## 2016-10-27 MED ORDER — CARVEDILOL 12.5 MG PO TABS
12.5000 mg | ORAL_TABLET | Freq: Two times a day (BID) | ORAL | Status: DC
  Administered 2016-10-27: 12.5 mg via ORAL
  Filled 2016-10-27 (×3): qty 1

## 2016-10-27 MED ORDER — METRONIDAZOLE 500 MG PO TABS
500.0000 mg | ORAL_TABLET | Freq: Three times a day (TID) | ORAL | Status: DC
  Administered 2016-10-27 – 2016-10-28 (×3): 500 mg via ORAL
  Filled 2016-10-27 (×3): qty 1

## 2016-10-27 MED ORDER — SPIRONOLACTONE 25 MG PO TABS
12.5000 mg | ORAL_TABLET | Freq: Every day | ORAL | Status: DC
  Administered 2016-10-27: 12.5 mg via ORAL
  Filled 2016-10-27 (×2): qty 1

## 2016-10-27 MED ORDER — FUROSEMIDE 10 MG/ML IJ SOLN
40.0000 mg | Freq: Two times a day (BID) | INTRAMUSCULAR | Status: DC
  Administered 2016-10-27 (×2): 40 mg via INTRAVENOUS
  Filled 2016-10-27 (×3): qty 4

## 2016-10-27 MED ORDER — SACUBITRIL-VALSARTAN 49-51 MG PO TABS
1.0000 | ORAL_TABLET | Freq: Two times a day (BID) | ORAL | Status: DC
  Administered 2016-10-27: 1 via ORAL
  Filled 2016-10-27 (×2): qty 1

## 2016-10-27 MED ORDER — OXYCODONE HCL 5 MG PO TABS
10.0000 mg | ORAL_TABLET | ORAL | Status: DC | PRN
  Administered 2016-10-27 – 2016-10-28 (×3): 10 mg via ORAL
  Filled 2016-10-27 (×3): qty 2

## 2016-10-27 MED ORDER — FUROSEMIDE 10 MG/ML IJ SOLN
40.0000 mg | Freq: Once | INTRAMUSCULAR | Status: AC
  Administered 2016-10-27: 40 mg via INTRAVENOUS
  Filled 2016-10-27: qty 4

## 2016-10-27 NOTE — Progress Notes (Signed)
Subjective: Patient sitting up in chair and appears comfortable. Less cellulitis abdominal wall. IR drain shows 95 ML's yesterday. JP is about half full today. It is a brownish white thick cloudy fluid.  Objective: Vital signs in last 24 hours: Temp:  [98.2 F (36.8 C)-100.4 F (38 C)] 99 F (37.2 C) (03/06 1126) Pulse Rate:  [71-111] 71 (03/06 1338) Resp:  [18-20] 20 (03/06 0400) BP: (88-135)/(52-87) 95/57 (03/06 1338) SpO2:  [86 %-100 %] 86 % (03/06 1338) Weight:  [119.8 kg (264 lb 1.6 oz)] 119.8 kg (264 lb 1.6 oz) (03/06 0200) Last BM Date: 10/25/16 240 Po recorded yesterday Urine 6875 Drain 95 ml TM 100.4, some tachycardia last PM, BP down some at 11 AM.  Sats down to 86% Na 133 K+ 3.0 Creatinine better 1.77 H/H is up some and platelets are up 443 CT abdomen yesterday:  1. Percutaneous left lower quadrant approach drainage catheter occupies site of prior lower abdominal abscess cavity. No significant reaccumulation of fluid, abscess nor hematoma noted.   2. Stable typical masslike consolidation with irregular borders and scattered punctate calcifications in the right lower lobe possibly residual from patient's history of non-Hodgkin's lymphoma. This warrants follow-up. 3. Mild diffuse subcutaneous edema/anasarca of the lower abdominal wall and pelvis. 4. Tiny hepatic hypodensity too small to further characterize statistically consistent with a cyst or hemangioma. 5. Small focus of fat possibly representing an intra mild lipoma of the left interpolar kidney versus cortical scarring.  Special Requests  DEEP SURGICAL WOUND 10/24/16 Escherichia coli      MIC    AMPICILLIN <=2 SENSITIVE "><=2 SENSITIVE  Sensitive    AMPICILLIN/SULBACTAM <=2 SENSITIVE "><=2 SENSITIVE  Sensitive    CEFAZOLIN <=4 SENSITIVE "><=4 SENSITIVE  Sensitive    CEFEPIME <=1 SENSITIVE "><=1 SENSITIVE  Sensitive    CEFTAZIDIME <=1 SENSITIVE "><=1 SENSITIVE  Sensitive    CEFTRIAXONE <=1 SENSITIVE "><=1  SENSITIVE  Sensitive    CIPROFLOXACIN <=0.25 SENSITIVE "><=0.25 SENS... Sensitive    Extended ESBL NEGATIVE  Sensitive    GENTAMICIN <=1 SENSITIVE "><=1 SENSITIVE  Sensitive    IMIPENEM <=0.25 SENSITIVE "><=0.25 SENS... Sensitive    PIP/TAZO <=4 SENSITIVE "><=4 SENSITIVE  Sensitive    TRIMETH/SULFA <=20 SENSITIVE "><=20 SENSIT... Sensitive              Intake/Output from previous day: 03/05 0701 - 03/06 0700 In: 450 [P.O.:240; IV Piggyback:200] Out: XY:112679; Drains:95] Intake/Output this shift: Total I/O In: 120 [P.O.:120] Out: 1600 [Urine:1600]  General appearance: alert, cooperative and no distress GI: Soft, some ongoing cellulitis but appears improved from admission. IR drain with a thick brownish white colored fluid. 95 mL yesterday.  Lab Results:   Recent Labs  10/26/16 0213 10/27/16 0409  WBC 7.1 9.4  HGB 7.7* 8.7*  HCT 24.9* 26.9*  PLT 373 443*    BMET  Recent Labs  10/26/16 0213 10/27/16 0409  NA 126* 133*  K 4.1 3.0*  CL 97* 93*  CO2 20* 28  GLUCOSE 128* 118*  BUN 21* 19  CREATININE 1.81* 1.77*  CALCIUM 8.1* 8.7*   PT/INR  Recent Labs  10/24/16 1415  LABPROT 16.3*  INR 1.30     Recent Labs Lab 10/21/16 1429  AST 17  ALT 13*  ALKPHOS 63  BILITOT 0.5  PROT 7.5  ALBUMIN 3.1*     Lipase     Component Value Date/Time   LIPASE 34 10/17/2016 1711     Studies/Results: Ct Abdomen Pelvis Wo Contrast  Result Date:  10/26/2016 CLINICAL DATA:  Worsening right-sided pain. Patient has drain for abscess status post hernia repair. History of non-Hodgkin's lymphoma. EXAM: CT ABDOMEN AND PELVIS WITHOUT CONTRAST TECHNIQUE: Multidetector CT imaging of the abdomen and pelvis was performed following the standard protocol without IV contrast. COMPARISON:  10/17/2016 CT FINDINGS: Lower chest: Elliptical masslike consolidation with irregular borders and a few scattered punctate calcifications seen within measures approximate 3.5 x 1.9 cm in the  periphery of the right lower lobe, grossly unchanged. No effusion. Hepatobiliary: Stable 4 mm right hepatic hypodensity too small to further characterize but statistically consistent with a cyst or hemangioma. No biliary dilatation. Gallbladder is physiologic in appearance without calculi. Pancreas: Unremarkable. No pancreatic ductal dilatation or surrounding inflammatory changes. Spleen: Normal in size without focal abnormality. Adrenals/Urinary Tract: Adrenal glands are unremarkable. Small 2.5 cm focus of fat along the periphery of the left interpolar kidney possibly an angiomyolipoma or cortical scarring. Bladder is unremarkable. Stomach/Bowel: No bowel obstruction. Enteric contrast noted admixed with stool in the right and left colon. Stomach is nondistended. There is normal small bowel rotation. Vascular/Lymphatic: No significant vascular findings are present. No enlarged abdominal or pelvic lymph nodes. Reproductive: Prostate is unremarkable. Other: There is a new pigtail percutaneous drainage catheter which has decompressed study lower abdominal abscess collection since prior exam. Some postprocedural air is noted deep to the umbilicus and at site of prior abscess cavity. Mild diffuse subcutaneous induration is noted along the ventral abdominal wall of the lower abdomen and pelvis. No abdominopelvic ascites. Musculoskeletal: No acute or significant osseous findings. IMPRESSION: 1. Percutaneous left lower quadrant approach drainage catheter occupies site of prior lower abdominal abscess cavity. No significant reaccumulation of fluid, abscess nor hematoma noted. 2. Stable typical masslike consolidation with irregular borders and scattered punctate calcifications in the right lower lobe possibly residual from patient's history of non-Hodgkin's lymphoma. This warrants follow-up. 3. Mild diffuse subcutaneous edema/anasarca of the lower abdominal wall and pelvis. 4. Tiny hepatic hypodensity too small to further  characterize statistically consistent with a cyst or hemangioma. 5. Small focus of fat possibly representing an intra mild lipoma of the left interpolar kidney versus cortical scarring. Electronically Signed   By: Ashley Royalty M.D.   On: 10/26/2016 02:02    Medications: . bisacodyl  10 mg Rectal Once  . carvedilol  12.5 mg Oral BID WC  . ciprofloxacin  500 mg Oral BID  . enoxaparin (LOVENOX) injection  40 mg Subcutaneous Q24H  . fluticasone  2 spray Each Nare Daily  . furosemide  40 mg Intravenous BID  . gabapentin  800 mg Oral TID  . metroNIDAZOLE  500 mg Oral Q8H  . mometasone-formoterol  2 puff Inhalation BID  .  morphine injection  2 mg Intravenous Once  . pantoprazole  40 mg Oral Daily  . potassium chloride  40 mEq Oral BID  . sacubitril-valsartan  1 tablet Oral BID  . senna-docusate  2 tablet Oral QHS  . simethicone  80 mg Oral TID  . spironolactone  12.5 mg Oral Daily    No IV fluids currently  Assessment/Plan Hx of Incisional hernia repair with Ventralight ST   Mesh, 10/18/16, Dr. 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 He appears comfortable, cellulitis appears better. Ongoing significant drainage from the JP. BP is down some on multiple agents per medicine for his congestive heart failure. He had a 7 L diuresis yesterday recorded.  LOS: 6 days    Yoceline Bazar 10/27/2016 [phone]

## 2016-10-27 NOTE — Progress Notes (Signed)
Night float interim progress note.  Patient by RN initially that patient was complaining of pain in his right abdomen. Later she called that he was also complaining of tight chest and worsening swelling of lower extremity.  During evaluation patient was upset that he was given too much fluid despite having a diagnosis of CHF, now it looks like his CHF is acting up, and he is not getting his scheduled CHF medicines. I explained to him that we gave him fluids when he was hypotensive and having high-grade fever, we are not giving him any extra fluids now except with antibiotics.  On exam. Chest. Bilaterally decreased air entry at bases, no crackles. Lower extremities. 2+ pitting edema up to below knees.  A/P. He does look volume overload, no daily weights on record, on weighing found to be 264 pounds, he was 240 pounds on 10/21/2016. -Lasix 40 mg IV. -Morphine 2 mg IV -Re started Coreg. 12.5 mg twice daily. -Might consider  restarting Entresto and spironolactone according to his blood pressure in the morning.  Hyponatremia. His sodium today was 126. It was decreased to 127 on 10/23/2016, then becomes 129 on March 3 and fourth. Again decreased to 126 on March 5. -Check urine and serum osmolality. -Check urine sodium.  Tayli Buch PGY1 10/27/16.

## 2016-10-27 NOTE — Progress Notes (Signed)
Pharmacy Antibiotic Note Matrix Schwind is a 41 y.o. male admitted on 10/21/2016 with intraabdominal abscess that is s/p percutaneous drain on 3/3 by IR.  Cultures taken from abscess on 3/3 have grown pan S E.coli and pharmacy asked to transition patient from Zosyn to Ciprofloxacin PO for continued treatment.   Plan: 1. Begin Ciprofloxacin 500 mg PO every 12 hours  2. Continue Flagyl 500 mg PO every 8 hours as ordered   Height: 6' (182.9 cm) Weight: 264 lb 1.6 oz (119.8 kg) IBW/kg (Calculated) : 77.6  Temp (24hrs), Avg:98.8 F (37.1 C), Min:97.7 F (36.5 C), Max:100.4 F (38 C)   Recent Labs Lab 10/21/16 1451  10/23/16 1520 10/23/16 2155 10/24/16 0317 10/25/16 0830 10/26/16 0213 10/27/16 0409  WBC  --   < > 10.8*  --  14.0* 7.9 7.1 9.4  CREATININE  --   < >  --  2.42* 2.22* 2.14* 1.81* 1.77*  LATICACIDVEN 1.16  --  1.7  --   --   --   --   --   VANCORANDOM  --   --   --  19  --   --   --   --   < > = values in this interval not displayed.  Estimated Creatinine Clearance: 75.6 mL/min (by C-G formula based on SCr of 1.77 mg/dL (H)).    No Known Allergies  Antimicrobials this admission:  3/6 Ciprofloxacin >>  Zosyn 3/2 >>3/6 Vancomycin 2/28>>3/4 Doxycycline 3/2 x 1   Microbiology results: 3/2 BCx: ngtd 3/3 UCx: ngF  3/3 surgical wound/deep abscess: E.coli pan S  Thank you for allowing pharmacy to be a part of this patient's care.  Vincenza Hews, PharmD, BCPS 10/27/2016, 10:16 AM

## 2016-10-27 NOTE — Progress Notes (Signed)
Referring Physician(s): Dr. Rolm Bookbinder  Supervising Physician: Markus Daft  Patient Status: [facility] - In-pt  Chief Complaint:  Abdominal abscess  Subjective: Patient sitting up in chair.  Denies pain at time of visit.  Complains of increased swelling- note plans to start Lasix.  Allergies: Patient has no known allergies.  Medications: Prior to Admission medications   Medication Sig Start Date End Date Taking? Authorizing Provider  albuterol (PROVENTIL HFA;VENTOLIN HFA) 108 (90 Base) MCG/ACT inhaler Inhale 2 puffs into the lungs every 6 (six) hours as needed for wheezing or shortness of breath. 10/06/16  Yes Alexa Angela Burke, MD  albuterol (PROVENTIL) (2.5 MG/3ML) 0.083% nebulizer solution Take 3 mLs (2.5 mg total) by nebulization every 6 (six) hours as needed for wheezing or shortness of breath. 10/06/16  Yes Alexa Angela Burke, MD  carvedilol (COREG) 12.5 MG tablet Take 1 tablet (12.5 mg total) by mouth 2 (two) times daily with a meal. 09/28/16  Yes Shirley Friar, PA-C  docusate sodium (COLACE) 100 MG capsule Take 100 mg by mouth 2 (two) times daily as needed for mild constipation.   Yes Historical Provider, MD  fluticasone (FLONASE) 50 MCG/ACT nasal spray Place 2 sprays into both nostrils daily. 10/06/16  Yes Alexa Angela Burke, MD  Fluticasone-Salmeterol (ADVAIR) 250-50 MCG/DOSE AEPB Inhale 2 puffs into the lungs 2 (two) times daily. 10/06/16  Yes Alexa Angela Burke, MD  furosemide (LASIX) 40 MG tablet Take 1 tablet (40 mg total) by mouth 2 (two) times daily. 09/14/16 12/13/16 Yes Larey Dresser, MD  gabapentin (NEURONTIN) 800 MG tablet Take 1 tablet (800 mg total) by mouth 3 (three) times daily. 10/06/16  Yes Alexa Angela Burke, MD  guaiFENesin-dextromethorphan (ROBITUSSIN DM) 100-10 MG/5ML syrup Take 5 mLs by mouth every 4 (four) hours as needed for cough. 10/06/16  Yes Alexa Angela Burke, MD  magnesium citrate SOLN Take 0.5-1 Bottles by mouth once as needed for mild constipation.   Yes Historical  Provider, MD  pantoprazole (PROTONIX) 40 MG tablet Take 1 tablet (40 mg total) by mouth daily. 10/06/16  Yes Alexa Angela Burke, MD  potassium chloride SA (K-DUR,KLOR-CON) 20 MEQ tablet Take 40 mEq by mouth daily.   Yes Historical Provider, MD  psyllium (METAMUCIL) 58.6 % packet Take 1 packet by mouth daily as needed (for constipation).   Yes Historical Provider, MD  sacubitril-valsartan (ENTRESTO) 49-51 MG Take 1 tablet by mouth 2 (two) times daily. 09/14/16  Yes Larey Dresser, MD  spironolactone (ALDACTONE) 25 MG tablet TAKE 1/2 TABLET BY MOUTH EACH DAY 09/03/16  Yes Amy D Clegg, NP  acetaminophen (TYLENOL) 500 MG tablet Take 1 tablet (500 mg total) by mouth every 6 (six) hours as needed. Patient not taking: Reported on 10/21/2016 01/01/16   Marella Chimes, PA-C  Multiple Vitamins-Minerals (MULTIVITAMIN WITH MINERALS) tablet Take 1 tablet by mouth daily.    Historical Provider, MD    Vital Signs: BP 122/81 (BP Location: Left Wrist)   Pulse (!) 111   Temp 98.9 F (37.2 C) (Oral)   Resp 20   Ht 6' (1.829 m)   Wt 264 lb 1.6 oz (119.8 kg)   SpO2 100%   BMI 35.82 kg/m   Physical Exam: Abd: soft, non-tender, drain output decreased slightly- 95 mL recorded yesterday, continues to be light brown, purulent output.  Drain site is c/d/i  Imaging: Ct Abdomen Pelvis Wo Contrast  Result Date: 10/26/2016 CLINICAL DATA:  Worsening right-sided pain. Patient has drain for abscess status post  hernia repair. History of non-Hodgkin's lymphoma. EXAM: CT ABDOMEN AND PELVIS WITHOUT CONTRAST TECHNIQUE: Multidetector CT imaging of the abdomen and pelvis was performed following the standard protocol without IV contrast. COMPARISON:  10/17/2016 CT FINDINGS: Lower chest: Elliptical masslike consolidation with irregular borders and a few scattered punctate calcifications seen within measures approximate 3.5 x 1.9 cm in the periphery of the right lower lobe, grossly unchanged. No effusion. Hepatobiliary: Stable 4 mm  right hepatic hypodensity too small to further characterize but statistically consistent with a cyst or hemangioma. No biliary dilatation. Gallbladder is physiologic in appearance without calculi. Pancreas: Unremarkable. No pancreatic ductal dilatation or surrounding inflammatory changes. Spleen: Normal in size without focal abnormality. Adrenals/Urinary Tract: Adrenal glands are unremarkable. Small 2.5 cm focus of fat along the periphery of the left interpolar kidney possibly an angiomyolipoma or cortical scarring. Bladder is unremarkable. Stomach/Bowel: No bowel obstruction. Enteric contrast noted admixed with stool in the right and left colon. Stomach is nondistended. There is normal small bowel rotation. Vascular/Lymphatic: No significant vascular findings are present. No enlarged abdominal or pelvic lymph nodes. Reproductive: Prostate is unremarkable. Other: There is a new pigtail percutaneous drainage catheter which has decompressed study lower abdominal abscess collection since prior exam. Some postprocedural air is noted deep to the umbilicus and at site of prior abscess cavity. Mild diffuse subcutaneous induration is noted along the ventral abdominal wall of the lower abdomen and pelvis. No abdominopelvic ascites. Musculoskeletal: No acute or significant osseous findings. IMPRESSION: 1. Percutaneous left lower quadrant approach drainage catheter occupies site of prior lower abdominal abscess cavity. No significant reaccumulation of fluid, abscess nor hematoma noted. 2. Stable typical masslike consolidation with irregular borders and scattered punctate calcifications in the right lower lobe possibly residual from patient's history of non-Hodgkin's lymphoma. This warrants follow-up. 3. Mild diffuse subcutaneous edema/anasarca of the lower abdominal wall and pelvis. 4. Tiny hepatic hypodensity too small to further characterize statistically consistent with a cyst or hemangioma. 5. Small focus of fat possibly  representing an intra mild lipoma of the left interpolar kidney versus cortical scarring. Electronically Signed   By: Ashley Royalty M.D.   On: 10/26/2016 02:02   Ct Abdomen Pelvis Wo Contrast  Result Date: 10/24/2016 CLINICAL DATA:  Right-sided abdominal pain. Fever since hernia repair. EXAM: CT ABDOMEN AND PELVIS WITHOUT CONTRAST TECHNIQUE: Multidetector CT imaging of the abdomen and pelvis was performed following the standard protocol without IV contrast. COMPARISON:  Abdominal CT from 10/17/2016 FINDINGS: Lower chest:  Low-density blood pool consistent with anemia. Band of opacity in the right lower lobe with internal calcifications is stable to mildly decreased in bulk compared to prior. Trace right pleural fluid. Hepatobiliary: Subcentimeter low-density in the central liver is too small for densitometry. There could be layering material within the gallbladder but no calcified gallstone. No inflammatory features. Pancreas: Unremarkable. Spleen: Unremarkable. Adrenals/Urinary Tract: Negative adrenals. No hydronephrosis or stone. Defect in the inferior pole left and upper pole right renal cortex attributed to scarring. Successful decompression of baldder by Foley catheter. Stomach/Bowel: Oral contrast reaches the hepatic flexure. No signs of ileus or obstruction. 15 x 6 x 15 cm gas and fluid collection in the ventral abdomen, extending into the recently repaired umbilical hernia. The neighboring omentum is reticulated and there is a discrete wall. Small volume fluid in the low pelvis and right paracolic gutter. Right paracolic fluid appears loculated and measures up to 5 cm in diameter. Small pneumoperitoneum, not unexpected. No extravasation of oral contrast. Vascular/Lymphatic: No acute vascular abnormality.  No mass or adenopathy. Reproductive:No pathologic findings. Musculoskeletal: No acute abnormalities. 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 IMPRESSION: 1. 15 x 5 x 15 cm gas and fluid collection in the  ventral abdomen and umbilical hernia site consistent with abscess. 2. Small fluid only collections in the right paracolic gutter and pelvis. 3. No postoperative ileus or obstruction. 4. Bandlike opacity with calcifications in the right lower lobe is stable to decreased in bulk compared to 10/17/2016. Chest CT follow-up is again recommended, suggest waiting 6-8 weeks to allow for resolution of active inflammation. Electronically Signed   By: Monte Fantasia M.D.   On: 10/24/2016 13:29   Dg Chest Port 1 View  Result Date: 10/23/2016 CLINICAL DATA:  Fever and abdominal distention. EXAM: PORTABLE CHEST 1 VIEW COMPARISON:  Chest from acute abdomen 10/21/2016 FINDINGS: Very low lung volumes limit assessment. Persistent patchy right basilar opacity, partially obscured. Progressive left perihilar opacities. Unchanged heart size and mediastinal contours. No evidence of pneumothorax or pleural fluid. IMPRESSION: Very low lung volumes. Persistent right basilar opacity, partially obscured. Development of left perihilar opacities, may be atelectasis. Recommend correlation for aspiration. Electronically Signed   By: Jeb Levering M.D.   On: 10/23/2016 22:00   Dg Abd Portable 1v  Result Date: 10/23/2016 CLINICAL DATA:  Abdominal distention. EXAM: PORTABLE ABDOMEN - 1 VIEW COMPARISON:  Abdominal radiographs yesterday. FINDINGS: Air-filled colon appears similar to prior exam. Probable gaseous small bowel dilatation in the left abdomen. No gross evidence of free air on supine views. IMPRESSION: Probable gaseous small bowel distention in the left mid abdomen, favoring ileus over obstruction. Air-filled colon is similar. Electronically Signed   By: Jeb Levering M.D.   On: 10/23/2016 22:02   Ct Image Guided Drainage By Percutaneous Catheter  Result Date: 10/25/2016 CLINICAL DATA:  Development of anterior peritoneal abscess just deep to the abdominal wall 1 week post ventral hernia repair with mesh. EXAM: CT GUIDED  CATHETER DRAINAGE OF PERITONEAL ABSCESS ANESTHESIA/SEDATION: 1.0 mg IV Versed 50 mcg IV Fentanyl Total Moderate Sedation Time:  12 minutes The patient's level of consciousness and physiologic status were continuously monitored during the procedure by Radiology nursing. PROCEDURE: The procedure, risks, benefits, and alternatives were explained to the patient. Questions regarding the procedure were encouraged and answered. The patient understands and consents to the procedure. A time out was performed prior to initiating the procedure. The anterior abdominal wall was prepped with chlorhexidine in a sterile fashion, and a sterile drape was applied covering the operative field. A sterile gown and sterile gloves were used for the procedure. Local anesthesia was provided with 1% Lidocaine. From a left anterior approach, an 18 gauge trocar needle was advanced into the anterior abdominal abscess. Fluid aspiration was performed. A guidewire was advanced into the collection. The tract was dilated over the wire and a 12 French percutaneous drainage catheter placed. Additional fluid was withdrawn from the catheter and sent for culture analysis. The catheter was then flushed and connected to a suction bulb. The drainage catheter was secured at the skin with a Prolene retention suture and StatLock device. COMPLICATIONS: None FINDINGS: Fluid return from the anterior abdominal abscess was a cloudy orange color and was foul-smelling. After placement of the drain, there was rapid return of fluid. IMPRESSION: CT-guided percutaneous drain placement within anterior abdominal abscess. A 12 French drain was placed and attached to suction bulb drainage. A fluid sample was sent for culture analysis. Electronically Signed   By: Aletta Edouard M.D.   On: 10/25/2016 08:53  Labs:  CBC:  Recent Labs  10/24/16 0317 10/25/16 0830 10/26/16 0213 10/27/16 0409  WBC 14.0* 7.9 7.1 9.4  HGB 8.6* 7.7* 7.7* 8.7*  HCT 28.0* 25.1* 24.9*  26.9*  PLT 470* 383 373 443*    COAGS:  Recent Labs  04/06/16 0209 04/15/16 0709 10/24/16 1415  INR 1.27 1.04 1.30    BMP:  Recent Labs  10/24/16 0317 10/25/16 0830 10/26/16 0213 10/27/16 0409  NA 129* 129* 126* 133*  K 4.4 3.6 4.1 3.0*  CL 95* 99* 97* 93*  CO2 23 21* 20* 28  GLUCOSE 108* 100* 128* 118*  BUN 28* 26* 21* 19  CALCIUM 8.1* 8.1* 8.1* 8.7*  CREATININE 2.22* 2.14* 1.81* 1.77*  GFRNONAA 36* 37* 46* 47*  GFRAA 41* 43* 53* 55*    LIVER FUNCTION TESTS:  Recent Labs  04/05/16 2131 10/17/16 1711 10/21/16 1429  BILITOT 0.4 0.3 0.5  AST 27 23 17   ALT 23 15* 13*  ALKPHOS 91 69 63  PROT 6.7 7.7 7.5  ALBUMIN 3.2* 3.3* 3.1*    Assessment and Plan: Intra-abdominal abscess, s/p perc drain on 3/3 Cultures are pending.  Continues IV Zosyn. Still with increased drain output.  Low grade temp overnight. Continue routine drain care.  RN is flushing regularly. IR to follow. Plan per surgery.   Electronically Signed: Docia Barrier 10/27/2016, 11:21 AM   I spent a total of 15 Minutes at the the patient's bedside AND on the patient's hospital floor or unit, greater than 50% of which was counseling/coordinating care for intra-abdominal abscess

## 2016-10-27 NOTE — Progress Notes (Signed)
Called to patient's room and he is complaining of increased pain in strength and frequency to Right flank/back. Discussed with MD and orders given for Morphine 2mg  every 2 hours PRN. Upon entering patient's room to give medicine- he states he is having increased pressure in his chest, swelling in his legs/ ankles, and knows his CHF is acting up. He states we have been giving him too much fluid and that his heart/vascular doctors keep him at less than 64 oz fluid a day. Paged MD and she came to assess patient. Additional dose of lasix given. Patient weighed at 264.1- which patient's states is 20 lbs up from what his baseline should be. Morphine and Lasix given and patient's legs elevated in chair. Will continue monitoring patient closely for changes.   Milford Cage, RN

## 2016-10-27 NOTE — Progress Notes (Addendum)
Subjective: Leonard [name] was seen and evaluated today at bedside. No new complaints. Strongly desires discharge however agreeable to staying to transition to oral meds in addition to IV diuresis for one more day. Did admit to some back pain overnight however denied any abdominal pain. Reports he feels his back/lateral pain is due to having too much fluid on him. He denies any dyspnea or shortness of breath.   Per night team, they were paged around 2-3 am regarding Leonard [name]. He was complaining of right-sided abdominal pain. Upon arrival, he complained of increased pressure in his arms, chest and swelling in his legs and feels his CHF is acting up. He expressed frustration and anger over being given IVF despite his known dx of CHF and wanted to be transfered to [facility]. It was explained that he received fluids because he was hypotensive, continued to have high-grade fevers and he had a significant infection requiring fluid support.   Objective:  Vital signs in last 24 hours: Vitals:   10/26/16 1938 10/26/16 2230 10/27/16 0200 10/27/16 0400  BP:  130/76  135/87  Pulse: (!) 101 100  (!) 111  Resp: 18 18  20   Temp:  (!) 100.4 F (38 C)  99.6 F (37.6 C)  TempSrc:  Oral  Oral  SpO2: 96%   100%  Weight:   264 lb 1.6 oz (119.8 kg)   Height:   6' (1.829 m)    General: Sitting in bedside recliner, in no acute distress. Clean breakfast tray at bedside. Appeared fatigued.  HENT: No conjunctival injection. No ptosis or scleral icterus.  Cardiovascular: Regular rhythm, tachycardic in the 90's. Systolic murmur Pulmonary: CTAB, normal WOB Abdomen: Abdomen is soft. Drain in place in RLQ containing tan/brown purulent fluid. Surgical wounds dry and intact. Mild erythema of right lateral abdominal wall. Tenderness to palpation mid-abdomen. Abdominal edema improved however still present. Skin: Warm, dry. 2+  peripheral edema noted in lower extremities. Many visible tattoos.   Assessment/Plan:  Abdominal wall  infection: Afebrile overnight and most recent temp in chart 99.6. He has been normotensive and tachycardia continues to improve. C/o right flank pain overnight for which he received morphine. Drain in place and continues to drain tan/brown purulent appearing fluid. Culture returned today growing pan-sensitive E.coli.  -D/c Zosyn (started 3/2) -Cipro + Flagyl (PO) -Tylenol PRN -Tolerating HH diet -- ?if there has been dietary indiscretion since admission -Repeat CBC and BMET in AM -D/c morphine --> added on PO Oxy IR as we are preparing to d/c patient within the next few days -Transfer from step down --> med/surg -Tele was d/c yesterday per patient request  Post-op Ileus: Improved. Continues to pass multiple BM's per day.  -Laxatives/stool softeners PRN -Simethicone and Protonix -Sennakot-S daily  AKI: SCr improved today at 1.7, Cr 1.8 yesterday.  -BMET in Am -Will need to monitor renal fxn in setting of diuresis   Hypokalemia: Noted on this mornings labs at 3.0. Will replace orally with KDur 40 mEq x2 today. Obtaining mag level and will replete if indicated.   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 CW 264, weight on adm 240 however unclear if this was patient reported. He reports his baseline is around 245. Expressed frustration over receiving IVF despite having dx of CHF however was explained that patient had significant infection, hypotension and high fevers -- requiring treatment with fluids.  -IV Lasix 40 mg BID (home 40 mg PO BID) -Restarted coreg  -Restarted  entresto and spironolactone this morning  Asthma: Advair and albuterol PRN.  Dispo: Discharge in approximately 2-3 days.  Boss Danielsen, D.O.  Internal Medicine Resident - PGY1 Pager # [phone]

## 2016-10-28 ENCOUNTER — Other Ambulatory Visit: Payer: Self-pay | Admitting: General Surgery

## 2016-10-28 ENCOUNTER — Other Ambulatory Visit (HOSPITAL_COMMUNITY): Payer: Self-pay | Admitting: General Surgery

## 2016-10-28 DIAGNOSIS — L0291 Cutaneous abscess, unspecified: Secondary | ICD-10-CM

## 2016-10-28 DIAGNOSIS — R918 Other nonspecific abnormal finding of lung field: Secondary | ICD-10-CM

## 2016-10-28 LAB — CULTURE, BLOOD (ROUTINE X 2)
CULTURE: NO GROWTH
Culture: NO GROWTH

## 2016-10-28 LAB — BASIC METABOLIC PANEL
Anion gap: 12 (ref 5–15)
BUN: 19 mg/dL (ref 6–20)
CHLORIDE: 91 mmol/L — AB (ref 101–111)
CO2: 28 mmol/L (ref 22–32)
CREATININE: 1.53 mg/dL — AB (ref 0.61–1.24)
Calcium: 8.4 mg/dL — ABNORMAL LOW (ref 8.9–10.3)
GFR calc non Af Amer: 56 mL/min — ABNORMAL LOW (ref >60)
Glucose, Bld: 112 mg/dL — ABNORMAL HIGH (ref 65–99)
Potassium: 3 mmol/L — ABNORMAL LOW (ref 3.5–5.1)
SODIUM: 131 mmol/L — AB (ref 135–145)

## 2016-10-28 LAB — CBC
HCT: 25.6 % — ABNORMAL LOW (ref 39.0–52.0)
HEMOGLOBIN: 8 g/dL — AB (ref 13.0–17.0)
MCH: 21.4 pg — ABNORMAL LOW (ref 26.0–34.0)
MCHC: 31.3 g/dL (ref 30.0–36.0)
MCV: 68.4 fL — ABNORMAL LOW (ref 78.0–100.0)
Platelets: 436 10*3/uL — ABNORMAL HIGH (ref 150–400)
RBC: 3.74 MIL/uL — AB (ref 4.22–5.81)
RDW: 18.1 % — ABNORMAL HIGH (ref 11.5–15.5)
WBC: 10.6 10*3/uL — AB (ref 4.0–10.5)

## 2016-10-28 MED ORDER — FUROSEMIDE 40 MG PO TABS: 80.0000 mg | ORAL_TABLET | Freq: Two times a day (BID) | ORAL | 5 refills | Status: DC

## 2016-10-28 MED ORDER — OXYCODONE HCL 10 MG PO TABS: 10.0000 mg | ORAL_TABLET | ORAL | 0 refills | Status: DC | PRN

## 2016-10-28 MED ORDER — AMOXICILLIN-POT CLAVULANATE 875-125 MG PO TABS: 1.0000 | ORAL_TABLET | Freq: Two times a day (BID) | ORAL | 0 refills | Status: AC

## 2016-10-28 MED ORDER — SENNOSIDES-DOCUSATE SODIUM 8.6-50 MG PO TABS: 2.0000 | ORAL_TABLET | Freq: Every day | ORAL | 0 refills | Status: DC

## 2016-10-28 MED ORDER — MAGNESIUM SULFATE 2 GM/50ML IV SOLN
2.0000 g | Freq: Once | INTRAVENOUS | Status: DC
  Filled 2016-10-28: qty 50

## 2016-10-28 MED ORDER — SIMETHICONE 80 MG PO CHEW: 80.0000 mg | CHEWABLE_TABLET | Freq: Four times a day (QID) | ORAL | 0 refills | Status: DC | PRN

## 2016-10-28 NOTE — Discharge Summary (Signed)
Name: Leonard [name] MRN: [id number] DOB: June 22, 1976 41 y.o. PCP: Alphonzo Grieve, MD  Date of Admission: 10/21/2016 12:32 PM Date of Discharge: 10/28/2016 Attending Physician: Rachel Moulds. Hoffman, DO  Discharge Diagnosis: 1. Postoperative ileus 2. Abdominal abscess, abdominal wall cellulitis  3. Acute on Chronic Combined Congestive Heart Failure 4. AKI, Hypokalemia 5. Right lung mass, History of NHL  Active Problems:   Chronic combined systolic and diastolic congestive heart failure (HCC)   Postoperative ileus (HCC)   Cellulitis   Cellulitis of abdominal wall   Fever  Discharge Medications: Allergies as of 10/28/2016   No Known Allergies     Medication List    STOP taking these medications   HYDROcodone-acetaminophen 5-325 MG tablet Commonly known as:  NORCO     TAKE these medications   acetaminophen 500 MG tablet Commonly known as:  TYLENOL Take 1 tablet (500 mg total) by mouth every 6 (six) hours as needed.   albuterol (2.5 MG/3ML) 0.083% nebulizer solution Commonly known as:  PROVENTIL Take 3 mLs (2.5 mg total) by nebulization every 6 (six) hours as needed for wheezing or shortness of breath.   albuterol 108 (90 Base) MCG/ACT inhaler Commonly known as:  PROVENTIL HFA;VENTOLIN HFA Inhale 2 puffs into the lungs every 6 (six) hours as needed for wheezing or shortness of breath.   amoxicillin-clavulanate 875-125 MG tablet Commonly known as:  AUGMENTIN Take 1 tablet by mouth 2 (two) times daily.   carvedilol 12.5 MG tablet Commonly known as:  COREG Take 1 tablet (12.5 mg total) by mouth 2 (two) times daily with a meal.   docusate sodium 100 MG capsule Commonly known as:  COLACE Take 100 mg by mouth 2 (two) times daily as needed for mild constipation.   fluticasone 50 MCG/ACT nasal spray Commonly known as:  FLONASE Place 2 sprays into both nostrils daily.   Fluticasone-Salmeterol 250-50 MCG/DOSE Aepb Commonly known as:  ADVAIR Inhale 2 puffs into the lungs 2  (two) times daily.   furosemide 40 MG tablet Commonly known as:  LASIX Take 2 tablets (80 mg total) by mouth 2 (two) times daily. What changed:  how much to take   gabapentin 800 MG tablet Commonly known as:  NEURONTIN Take 1 tablet (800 mg total) by mouth 3 (three) times daily.   guaiFENesin-dextromethorphan 100-10 MG/5ML syrup Commonly known as:  ROBITUSSIN DM Take 5 mLs by mouth every 4 (four) hours as needed for cough.   magnesium citrate Soln Take 0.5-1 Bottles by mouth once as needed for mild constipation.   multivitamin with minerals tablet Take 1 tablet by mouth daily.   Oxycodone HCl 10 MG Tabs Take 1 tablet (10 mg total) by mouth every 4 (four) hours as needed.   pantoprazole 40 MG tablet Commonly known as:  PROTONIX Take 1 tablet (40 mg total) by mouth daily.   potassium chloride SA 20 MEQ tablet Commonly known as:  K-DUR,KLOR-CON Take 40 mEq by mouth daily.   psyllium 58.6 % packet Commonly known as:  METAMUCIL Take 1 packet by mouth daily as needed (for constipation).   sacubitril-valsartan 49-51 MG Commonly known as:  ENTRESTO Take 1 tablet by mouth 2 (two) times daily.   senna-docusate 8.6-50 MG tablet Commonly known as:  Senokot-S Take 2 tablets by mouth at bedtime.   simethicone 80 MG chewable tablet Commonly known as:  MYLICON Chew 1 tablet (80 mg total) by mouth every 6 (six) hours as needed for flatulence (gas pains, distended belly).   spironolactone 25  MG tablet Commonly known as:  ALDACTONE TAKE 1/2 TABLET BY MOUTH EACH DAY       Disposition and follow-up:   Mr.Brodyn Vogel was discharged from [facility] in stable condition.  At the hospital follow up visit please address:  1.   Abdominal Abscess with drain: Prescribed 2 week course of augmentin and was discharged home with perc drain. Please ensure he is compliant with this regimen and that the drain is still having adequate output. He was instructed to schedule an  appointment with the drain clinic, please ensure he has done so.  Congestive Heart Failure:  He was overloaded during hospitalization secondary to requiring IVF replacement due to hypotension/fever. He was diuresed with IV Lasix while in hospital with continued diuresis as an outpatient for 4 days. He was instructed to take 80 mg BID for 4 days, then his usual dose of 40 mg BID. He is to weigh himself daily. Please ensure he is at or near his dry weight of 245 lbs. He has a follow up appointment with his heart failure team.  Surgery: Please ensure the patient has touched base with general surgery. He will likely require repeat surgery to remove vs replace his mesh.  RIGHT LUNG MASS/HISTORY OF NHL: Right lung mass was detected on CT, concerning for recurrence of NHL. This will need to be followed up as an outpatient. Will likely need to be referred to oncology.   2.  Labs / imaging needed at time of follow-up: Weight (ensure at dry weight of 245), BMET (ensure resolution of AKI and hypokalemia).  3.  Pending labs/ test needing follow-up: none.  Follow-up Appointments: Appointment scheduled in our [facility] at the [facility] for next week. Has appointment scheduled for follow-up with his heart failure team. Has been provided contact information for [facility] and drain clinic. These specialists are aware of patient and should ensure follow up.   Follow-up Information    Alphonzo Grieve, MD. Schedule an appointment as soon as possible for a visit in 2 week(s).   Specialty:  Internal Medicine Contact information: [address] [place] [address] [phone]        Carylon Perches, MD Follow up.   Specialty:  Interventional Radiology Why:  CALL AND MAKE AN APPOINTMENT [place] information: [address] [place] [address] [phone]        [name], MD. Schedule an appointment as soon as possible for a visit in 1 week(s).   Specialty:  General  Surgery Contact information: [address] [place] [address] [phone]          Hospital Course by problem list: Active Problems:   Right lower lobe lung mass   Acute on chronic congestive heart failure (HCC)   Postoperative ileus (HCC)   Cellulitis   Cellulitis of abdominal wall   Fever   1. Postoperative ileus Kelsie Kramp is a 41 year old male with MHx significant for recent history of admission for large incarcerated ventral hernia status post repair and mesh placement who presents with abdominal pain, nausea, vomiting and constipation. Has tried treatments including hydrocodone, goody powders, stool softeners and mag citrate at home without relief. He had multiple large bowel movements following DRE +/- suppository placement with relief of pain. During his hospitalization he continued to have intermittent constipation as manifested by abdominal pain, relieved by a combination of stool softeners, laxatives and suppositories. By time  of discharge, he was having regular bowel movements with scheduled Senokot-S, simethicone and was tolerating a regular diet.   2. Abdominal abscess, Cellulitis On admission, he was noted to have an area of erythema and warmth overlying his lower abdomen and he was subsequently started on empiric IV Vancomycin for cellulitis. His erythema improved by HD2 however he continued to remain febrile and started to become tachycardic and hypotensive requiring IVF support. Antibiotics were broadened to Vancomycin and Zosyn. CT abdomen/pelvis showed a large gas and fluid filled collection in ventral abdomen extending into the recently repaired umbilical hernia measuring 15 cm x 5 x 15 cm. There was also a right paracolic loculated fluid collection measuring up to 5 cm. IR was consulted who, with the assistance of CT, placed a perc drain with significant purulent output. He continued to have significant drain output throughout his hospitalization. He  continued to spike fevers and have intermittent right lateral back pain however fevers had resolved 36 hours prior to discharge and his right back pain had improved so that his pain was controlled with Oxy IR 10 mg. Culture was obtained which grew pan-sensitive E.coli and he was transitioned from IV Zosyn --> PO Cipro + Flagyl and was eventually discharged home with a 2 week course of Augmentin BID. This plan was discussed with gen surg who agreed with long-term antibiotics and he may require additional surgery to remove the mesh. The patient was informed he will need to follow up with general surgery, interventional radiology and his primary care physician. Strict return precautions were reviewed with the patient and he voiced understanding of this and with antibiotic compliance.   3. Acute on Chronic Combined Systolic and Diastolic CHF Patients reported weight on admission was 240 lbs. He reports his dry weight is 245 lbs. His diuretics were held in the setting of high-grade fever (often 102-104*), tachycardia and hypotension requiring fluid support during the initial part of his hospitalization. Fortunately, he responded to fluid replacement, abscess drainage and broad spectrum antibiotics. He was approximately 20 lbs up from his dry weight following this and he was started on IV diuresis during the last segment of his hospitalization with good result. On discharge, his weight was 257 lbs, about 15 lbs up from dry weight. He was discharged home with an increased lasix regimen (discharged with lasix 80 mg BID x 4 days, followed by resumption of his home dose of lasix 40 mg BID). He has follow up already scheduled with his heart failure team in addition to his PCP next week.   4, 5. Hypokalemia, AKI He developed hypokalemia 2 days prior to discharge in the setting of IV diuresis. This was replaced with PO 40 mEq BID daily. In addition, his spironolactone that was initially held in setting of hypotension, was  resumed at this time. He will need follow up BMET to ensure potassium levels are within acceptable range and that his renal function has returned to baseline.   6. Non-Hodgkin's Lymphoma (remission), Right lung mass He has history of NHL (s/p radiation) currently in remission. CT abdomen/pelvis demonstrated a mass-like consolidation with irregular boarders, with a few scattered punctate calcifications, measuring approximately 3.5 x 1.9 cm in periphery of right lower lobe. This is concerning, especially given his history of NHL. This will need to be followed-up as an outpatient.   Discharge Vitals:   BP 113/61 (BP Location: Left Arm)   Pulse 99   Temp 98.9 F (37.2 C) (Oral)   Resp 17  Ht 6' (1.829 m)   Wt 258 lb 1.6 oz (117.1 kg)   SpO2 94%   BMI 35.00 kg/m   Pertinent Labs, Studies, and Procedures:  KUB 2/28: Ileus vs partial SBO. No free air. Right lung mass seen KUB 3/1: Moderate stool volume CT abd/pelv 3/3: Large 15 x 5 x 15 cm gas and fluid-filled collection in the ventral abdomen, extending into the recently repaired umbilical hernia CT-guided percutaneous drain placement 3/3 CT abd/pelv 3/5: No significant re-accumulation of fluid. Right lung mass again appreciated Abscess culture: Abundant GNR which resulted as pan-sensitive E.coli  Discharge Instructions: Please call and confirm appointments with [facility] Surgery, Interventional Radiology drain clinic and also with your primary care physician. It is important that you take your antibiotics as prescribed and do not miss a dose. Please take this prescription until the bottle is empty OR as directed by your physician. Please continue taking stool softeners and laxatives to ensure good bowel movements, especially since you have been prescribed a pain medication. Please take Oxycodone only as needed and do not mix this medicine with any alcohol or recreational drugs.  As always, please call your physician or go immediately to  the ED if your symptoms were to worsen, if you were to develop a high-grade fever.  SignedEinar Gip, DO 10/28/2016, 10:46 AM   Pager: [phone]

## 2016-10-28 NOTE — Progress Notes (Signed)
Patient ID: Leonard [name], male   DOB: [birth year], 41 y.o.   MRN: [id number] Patient left very quickly this morning prior to planned discharge.  Therefore, we were not able to see him to get [facility] etc set up.  I will place an order for drain clinic follow up.  He needs to have [place] for drain injections with 5-10cc of normal saline daily if this is able to be set up.  Otherwise, we will repeat a CT scan in 1-2 weeks with drain evaluation.  Leonard [name] E 11:34 AM 10/28/2016

## 2016-10-28 NOTE — Progress Notes (Signed)
Subjective: Leonard [name] was seen and evaluated today at bedside. Was seen ambulating halls without issue and getting water from vending machine upon arrival to the floor. He reports his abdominal pain is mild and his back/lateral pain has improved. Tolerated PO pain control overnight and feels he would be well controlled at home on this medication. Reports swelling has improved. Reports dry weight is ~245 lbs. Admitted that someone [facility] for him last night. He is anxious for discharge. Reports his wife will lose her job if she is not able to make it to work today by 10 am. Reports he needs to be discharged so he can watch the children.   Night team did not receive any nursing communication regarding this patient.   Objective:  Vital signs in last 24 hours: Vitals:   10/27/16 1918 10/28/16 0115 10/28/16 0556 10/28/16 0605  BP: (!) 92/57 (!) 97/44  113/61  Pulse: 89   99  Resp: 17   17  Temp: 97.9 F (36.6 C)   98.9 F (37.2 C)  TempSrc:    Oral  SpO2: 99%   94%  Weight:   258 lb 1.6 oz (117.1 kg)   Height:       General: Sitting in bedside recliner, in no acute distress. Alert.  HENT: No conjunctival injection. No ptosis or scleral icterus.  Cardiovascular: Regular rate and rhythm. Systolic murmur appreciated.  Pulmonary: CTAB, perhaps diminished breath sounds BL bases, normal WOB Abdomen: Abdomen is soft. Drain in place in RLQ containing tan/brown purulent fluid. Surgical wounds dry and intact. Skin: Warm, dry. 2+  peripheral edema noted in lower extremities. Many visible tattoos.   Assessment/Plan:  Abdominal wall infection: Afebrile overnight. Normotensive and without tachycardia this morning. Right back pain improving. Denies any significant abdominal pain around abscess or surgical site. Drain continues to drain tan/brown purulent appearing fluid. Culture growing pan-sensitive E.coli. Was transitioned to oral Abx yesterday without apparent issue.    -Currently on Cipro + Flagyl. Will d/c patient with Augmentin regimen x 2 weeks as this patient is likely to have better compliance with this regimen. Touched base with surg who agree and will follow up outpatient. -PO Oxy IR 10 mg. 5 day supply given for acute pain/surgical pain -Have made an appointment for pt in our internal medicine clinic for next week.  -Pt has been given information regarding the IR drain clinic. Instructed to call today and arrange f/u.   Post-op Ileus: Resolved. Now with regular bowel movements. -Laxatives/stool softeners PRN -Simethicone and Protonix -Sennakot-S daily  AKI: SCr improved today at 1.5.  -Will need to monitor renal fxn in setting of diuresis. Will include recs for BMET at [facility].   Hypokalemia:  Repleated yesterday and in addition, restarted Spironolactone yesterday as well. KDur 40 mEq x2 ordered. Mag level 1.5. Was replaced with IV Mag 2 g.   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 CW 258. Reports dry weight is around 245. About 13 more lbs to go. Renal function is stable and patient reports he is comfortable with continuing diuresis at home. Will send home with double his home dose x4 days. He has a follow up appointment with his heart failure team next week.  -Discharge home with lasix 80 mg BID x 4 days, then to resume his normal home dose of lasix 40 mg BID -Pt has follow up appointment already scheduled with his HF physician -Counseled on avoidance of dietary  sodium, including fast foods.   Asthma: Advair and albuterol PRN.  Dispo: Discharge TODAY.   Troy Kanouse, D.O.  Internal Medicine Resident - PGY1 Pager # [phone]

## 2016-10-28 NOTE — Progress Notes (Signed)
Spoke to [place] PA from surgery and she wanted the drain flushed daily and patient contacted with this information. He stated that he would come back to the unit for flushes and proper teaching on drain flushing once his wife gets off work.

## 2016-10-28 NOTE — Discharge Instructions (Signed)
Antibiotic Medicine, Adult Antibiotic medicines are used to treat infections caused by bacteria, such as strep throat and urinary tract infection (UTI). Antibiotic medicines will not work for viral illnesses, such as colds or the flu (influenza). They work by killing the bacteria that is making you sick. Antibiotics can also have serious side effects. It is important that you take antibiotic medicines safely and only when needed. When do I need to take antibiotics? Antibiotics are medicines that treat bacterial infections. You may need antibiotics for:  UTI.  Strep throat.  Meningitis. This infection affects the spinal cord and brain.  Bacterial sinusitis.  Serious lung infection. You may start antibiotics while your health care provider waits for test results to come back. Common tests may include throat, urine, blood, or mucus culture. Your health care provider may change or stop the antibiotic depending on your test results. When are antibiotics not needed? You do not need antibiotics for most common illnesses. These illnesses may be caused by a virus, not a bacteria. You do not need antibiotics for:  The common cold.  Influenza.  Sore throat.  Discolored mucus.  Bronchitis. Antibiotics are not always needed for all bacterial infections. Many of these infections clear up without antibiotic treatment. Do not ask for or take antibiotics when they are not necessary. How long should I take the antibiotic? You must take the entire prescription. Continue to take your antibiotic for as long as told by your health care provider. Do not stop taking it even if you start to feel better. If you stop taking it too soon:  You may start to feel sick again.  Your infection may become harder to treat.  Complications may develop. Each course of antibiotics needs a different amount of time to work. Some antibiotic courses last only a few days. Some last about a week to 10 days. In some cases,  you may need to take antibiotics for a few weeks to completely treat the infection. What if I miss a dose? Try not to miss any doses of medicine. If you miss a dose, call your health care provider or pharmacist for advice. Sometimes it is okay to take the missed dose as soon as possible. What are the risks of taking antibiotics? Most antibiotics can cause an infection called Clostridium difficile (C. difficile), which causes severe diarrhea. This infection happens when the antibiotics kill the healthy bacteria in your intestines. This allows C. difficile to grow. The infection needs to be treated right away. Let your health care provider know if:  You have diarrhea while taking an antibiotic.  You have diarrhea after you stop taking an antibiotic. C. difficile infection can start weeks after stopping the antibiotic. Taking an antibiotic also puts you at risk for getting a bacteria that does not respond to medicine (antibiotic-resistant infection) in the future. Antibiotics can cause bacteria to change so that if the antibiotic is taken again, the medicine is not able to kill the bacteria. These infections can be more serious and, in some cases, life-threatening. Do antibiotics affect birth control? Birth control pills may not work while you are on antibiotics. If you are taking birth control pills, continue taking them as usual and use a second form of birth control, such as a condom, to avoid unwanted pregnancy. Continue using the second form of birth control until your health care provider says you can stop. What else should I know about taking antibiotics? It is important for you to take antibiotics exactly  as told. Make sure that you:  Take the entire course of antibiotic that was prescribed. Do not stop taking your antibiotics even if your symptoms improve.  Take the correct amount of medicine each day.  Ask your health care provider:  How long to wait in between doses.  If the antibiotic  should be taken with food.  If there are any foods, drinks, or medicines that you should avoid while taking the antibiotics.  If there are any side effects you should be aware of.  Only use the antibiotics prescribed for you by your health care provider. Do not use antibiotics prescribed for someone else.  Drink a large glass of water along with the antibiotics.  Ask the pharmacist for a syringe, cup, or spoon that properly measures the antibiotics.  Throw away any leftover medicine. Contact a health care provider if:  Your symptoms get worse.  You have new joint pain or muscle aches that begin after starting the antibiotic. When should I seek immediate medical care? Seek immediate medical care if:  You have signs of a serious allergic reaction to antibiotics. If you have signs of a severe allergic reaction, stop taking the antibiotic right away. Signs may include:  Hives, which are raised, itchy, red bumps on the skin.  Skin rash.  Trouble breathing.  A wheezing sound when you breathe.  Swelling anywhere on your body.  Feeling dizzy.  Vomiting.  Your urine turns dark or becomes blood-colored.  Your skin turns yellow.  You bruise or bleed easily.  You have severe diarrhea and abdominal cramps.  You have a severe headache. Summary  Antibiotic medicines are used to treat infections caused by bacteria, such as strep throat and UTIs. It is important that you take antibiotic medicines only when needed.  Your health care provider may change or stop the antibiotic depending on your test results.  Most antibiotics can cause an infection called Clostridium difficile (C. difficile), which causes severe diarrhea. Let your health care provider know if you develop diarrhea while taking an antibiotic.  Take the entire course of antibiotic that was prescribed. This information is not intended to replace advice given to you by your health care provider. Make sure you discuss  any questions you have with your health care provider. Document Released: 04/22/2004 Document Revised: 08/11/2016 Document Reviewed: 08/11/2016 Elsevier Interactive Patient Education  2017 [employer] are being discharged home with a long course of antibiotics. You will take Augmentin (Amoxicillin-Clavulanate) twice daily for 2 weeks. Please take this medication on time every day. It is important that you complete the course.  You will need to schedule an appointment with your primary care physician within 1-2 weeks of discharge. It is important that you follow-up your abdominal infection.  You will also need to schedule an appointment with [facility] Surgery, the surgeons who performed the procedure and also who have been following you in the hospital. Unfortunately, you may require replacement of your mesh due to infection and it's important that you follow up with them to be evaluated for that. Please call them TODAY and schedule an appointment.  You also need to go to the interventional radiologists drain clinic for maintenance on your drain. Please call them TODAY and arrange a follow up! FOR YOUR HEART FAILURE, YOU ARE BEING DISCHARGED HOME ON LASIX 80 MG BID FOR THE NEXT SEVERAL DAYS. PLEASE TAKE THIS REGIMEN STARTING TODAY AND CONTINUE THIS UNTIL Sunday 11/01/16. PLEASE FOLLOW UP WITH YOUR PRIMARY CARE PHYSICIAN,  [place]!!!!! PLEASE REMEMBER, YOU WILL RESUME YOUR NORMAL LASIX REGIMEN (40 MG TWICE DAILY) STARTING Monday 11/02/16

## 2016-10-28 NOTE — Progress Notes (Signed)
Patient discharge with drain place and pt verbalized how to charge and empty drain. Explain to patient that he would need to flush the drain but I didn't know how much and frequency. Patient was rushing to leave and evening willing to leave AMA due to his wife work and child care. Discharge instruction given and IV removed. Patient has prescription to pick and oxycodone prescription in hand review CHF quicky  And dressing supplied measuring devise giving to  Measure drain.

## 2016-10-29 LAB — AEROBIC/ANAEROBIC CULTURE W GRAM STAIN (SURGICAL/DEEP WOUND)

## 2016-10-29 LAB — AEROBIC/ANAEROBIC CULTURE (SURGICAL/DEEP WOUND)

## 2016-11-03 ENCOUNTER — Ambulatory Visit: Payer: Medicaid Other

## 2016-11-04 ENCOUNTER — Encounter: Payer: Self-pay | Admitting: Family Medicine

## 2016-11-04 ENCOUNTER — Other Ambulatory Visit (HOSPITAL_COMMUNITY): Payer: Self-pay

## 2016-11-04 NOTE — Progress Notes (Signed)
Paramedicine Encounter    Patient ID: Leonard [name], male    DOB: 11-Nov-1975, 41 y.o.   MRN: [id number]   Patient Care Team: Alphonzo Grieve, MD as PCP - General (Internal Medicine)  Patient Active Problem List   Diagnosis Date Noted  . Cellulitis of abdominal wall   . Fever   . Cellulitis 10/22/2016  . Postoperative ileus ([place]) 10/21/2016  . Incarcerated hernia 10/18/2016  . Right lower lobe lung mass 10/18/2016  . Acute on chronic congestive heart failure ([place])   . Incarcerated umbilical hernia   . Asthma 10/06/2016  . History of substance abuse 10/06/2016  . TSH elevation 10/06/2016  . Bronchitis 10/06/2016  . Chronic pain syndrome 10/06/2016  . History of non-Hodgkin's lymphoma 04/30/2016  . Snoring 04/30/2016  . Chronic combined systolic and diastolic congestive heart failure ([place]) 04/05/2016  . Essential hypertension 10/09/2015    Current Outpatient Prescriptions:  .  albuterol (PROVENTIL HFA;VENTOLIN HFA) 108 (90 Base) MCG/ACT inhaler, Inhale 2 puffs into the lungs every 6 (six) hours as needed for wheezing or shortness of breath., Disp: 1 Inhaler, Rfl: 3 .  albuterol (PROVENTIL) (2.5 MG/3ML) 0.083% nebulizer solution, Take 3 mLs (2.5 mg total) by nebulization every 6 (six) hours as needed for wheezing or shortness of breath., Disp: 75 mL, Rfl: 12 .  amoxicillin-clavulanate (AUGMENTIN) 875-125 MG tablet, Take 1 tablet by mouth 2 (two) times daily., Disp: 28 tablet, Rfl: 0 .  carvedilol (COREG) 12.5 MG tablet, Take 1 tablet (12.5 mg total) by mouth 2 (two) times daily with a meal., Disp: 60 tablet, Rfl: 3 .  docusate sodium (COLACE) 100 MG capsule, Take 100 mg by mouth 2 (two) times daily as needed for mild constipation., Disp: , Rfl:  .  fluticasone (FLONASE) 50 MCG/ACT nasal spray, Place 2 sprays into both nostrils daily., Disp: 16 g, Rfl: 2 .  Fluticasone-Salmeterol (ADVAIR) 250-50 MCG/DOSE AEPB, Inhale 2 puffs into the lungs 2 (two) times daily., Disp: 60 each, Rfl: 3 .   gabapentin (NEURONTIN) 800 MG tablet, Take 1 tablet (800 mg total) by mouth 3 (three) times daily., Disp: 90 tablet, Rfl: 0 .  Multiple Vitamins-Minerals (MULTIVITAMIN WITH MINERALS) tablet, Take 1 tablet by mouth daily., Disp: , Rfl:  .  Oxycodone HCl 10 MG TABS, Take 1 tablet (10 mg total) by mouth every 4 (four) hours as needed., Disp: 30 tablet, Rfl: 0 .  pantoprazole (PROTONIX) 40 MG tablet, Take 1 tablet (40 mg total) by mouth daily., Disp: 30 tablet, Rfl: 11 .  potassium chloride SA (K-DUR,KLOR-CON) 20 MEQ tablet, Take 40 mEq by mouth daily., Disp: , Rfl:  .  sacubitril-valsartan (ENTRESTO) 49-51 MG, Take 1 tablet by mouth 2 (two) times daily., Disp: 60 tablet, Rfl: 5 .  simethicone (MYLICON) 80 MG chewable tablet, Chew 1 tablet (80 mg total) by mouth every 6 (six) hours as needed for flatulence (gas pains, distended belly)., Disp: 30 tablet, Rfl: 0 .  spironolactone (ALDACTONE) 25 MG tablet, TAKE 1/2 TABLET BY MOUTH EACH DAY, Disp: 17 tablet, Rfl: 2 .  acetaminophen (TYLENOL) 500 MG tablet, Take 1 tablet (500 mg total) by mouth every 6 (six) hours as needed. (Patient not taking: Reported on 10/21/2016), Disp: 30 tablet, Rfl: 0 .  furosemide (LASIX) 40 MG tablet, Take 2 tablets (80 mg total) by mouth 2 (two) times daily., Disp: 60 tablet, Rfl: 5 .  guaiFENesin-dextromethorphan (ROBITUSSIN DM) 100-10 MG/5ML syrup, Take 5 mLs by mouth every 4 (four) hours as needed for cough. (Patient not  taking: Reported on 11/04/2016), Disp: 118 mL, Rfl: 0 .  magnesium citrate SOLN, Take 0.5-1 Bottles by mouth once as needed for mild constipation., Disp: , Rfl:  .  psyllium (METAMUCIL) 58.6 % packet, Take 1 packet by mouth daily as needed (for constipation)., Disp: , Rfl:  .  senna-docusate (SENOKOT-S) 8.6-50 MG tablet, Take 2 tablets by mouth at bedtime. (Patient not taking: Reported on 11/04/2016), Disp: 60 tablet, Rfl: 0 No Known Allergies   Social History   Social History  . Marital status: Single     Spouse name: N/A  . Number of children: N/A  . Years of education: N/A   Occupational History  . Not on file.   Social History Main Topics  . Smoking status: Former Smoker    Packs/day: 1.25    Years: 10.00    Types: Cigarettes    Quit date: 04/08/2007  . Smokeless tobacco: Current User    Types: Snuff  . Alcohol use No  . Drug use: No  . Sexual activity: Yes   Other Topics Concern  . Not on file   Social History Narrative  . No narrative on file    Physical Exam  Constitutional: He appears well-developed.  Neck: Normal range of motion.  Pulmonary/Chest: Effort normal. No respiratory distress. He has no wheezes. He has no rales.  Musculoskeletal: Normal range of motion. He exhibits no edema.  Skin: Skin is warm and dry.  Psychiatric: He has a normal mood and affect.        Future Appointments Date Time Provider [place]  11/05/2016 2:30 PM [facility] CT 1 [facility]  11/05/2016 3:00 PM [facility] IR [facility]  11/05/2016 8:00 PM [facility] ROOM 3 [facility]  11/09/2016 9:00 AM Darreld Mclean, MD [facility] None  11/09/2016 9:45 AM [facility]  11/10/2016 10:40 AM [facility] CLINIC [facility] None   BP 118/84   Pulse (!) 108   Resp 16   Wt 238 lb 3.2 oz (108 kg)   SpO2 98%   BMI 32.31 kg/m  Weight yesterday-236 Last visit weight-241  Leonard [name] is seen at home today and reports minor abdominal discomfort associated with an infection which he is being treated for. He currently has a drain tube in his abdomen for this infection. He is scheduled to go to the doctor this week to have his infection evaluated. He denies having SOB, dizziness or headaches. His medications were verified and pillbox was refilled. No medications need to be ordered at this time.  Jacquiline Doe, EMT-Paramedic Marylouise Stacks, EMT-Paramedic 11/04/16  ACTION: Home visit completed Next visit planned for 1 week

## 2016-11-05 ENCOUNTER — Other Ambulatory Visit (HOSPITAL_COMMUNITY): Payer: Self-pay | Admitting: General Surgery

## 2016-11-05 ENCOUNTER — Ambulatory Visit (HOSPITAL_BASED_OUTPATIENT_CLINIC_OR_DEPARTMENT_OTHER): Payer: Medicaid Other | Attending: Student

## 2016-11-05 ENCOUNTER — Other Ambulatory Visit: Payer: Self-pay | Admitting: General Surgery

## 2016-11-05 ENCOUNTER — Ambulatory Visit
Admission: RE | Admit: 2016-11-05 | Discharge: 2016-11-05 | Disposition: A | Discharge status: Home / Self Care | Payer: Medicaid Other | Source: Ambulatory Visit | Attending: General Surgery | Admitting: General Surgery

## 2016-11-05 DIAGNOSIS — L0291 Cutaneous abscess, unspecified: Secondary | ICD-10-CM

## 2016-11-05 HISTORY — PX: IR RADIOLOGIST EVAL & MGMT: IMG5224

## 2016-11-05 MED ORDER — IOPAMIDOL (ISOVUE-300) INJECTION 61%
125.0000 mL | Freq: Once | INTRAVENOUS | Status: AC | PRN
  Administered 2016-11-05: 125 mL via INTRAVENOUS

## 2016-11-05 NOTE — Progress Notes (Signed)
Referring Physician(s): Dr Carlean Jews Kinsinger  Chief Complaint: The patient is seen in follow up today s/p  10/24/16:CT-guided percutaneous drain placement within anterior abdominal abscess. A 12 French drain was placed and attached to suction bulb drainage  History of present illness:  CLINICAL DATA:  Development of anterior peritoneal abscess just deep to the abdominal wall 1 week post ventral hernia repair with mesh. Abscess drain placed 3/3 in IR Discharged from hospital 10/28/2016 Has been on antibiotics- Augmentin 875/125 mg since then Continues now. To see Dr Kieth Brightly tomorrow. OP is dark milky yellow; 15 cc in JP Has had over 16 oz total output since 10/28/16. Not sure how much daily. Flushing daily til he ran out of flushes Tue night (2 days ago). Denies fever/chills  Scheduled now for CT and recheck of drain/abscess   Past Medical History:  Diagnosis Date  . Asthma   . Blood transfusion without reported diagnosis   . CHF (congestive heart failure) ([place]) 04/05/2016  . COPD (chronic obstructive pulmonary disease) ([place])   . Hypertension   . Lymphoma ([place])    x 2  . Sleep apnea   . Thyroid disease     Past Surgical History:  Procedure Laterality Date  . CARDIAC CATHETERIZATION N/A 04/15/2016   Procedure: Right/Left Heart Cath and Coronary Angiography;  Surgeon: Larey Dresser, MD;  Location: [place] [facility];  Service: Cardiovascular;  Laterality: N/A;  . LAPAROSCOPIC ASSISTED SPIGELIAN HERNIA REPAIR N/A 10/18/2016   Procedure: LAPAROSCOPIC REPAIR OF UMBILICAL HERNIA  WITH MESH AND LYSIS OF ADHESIONS.;  Surgeon: [name], MD;  Location: [facility];  Service: General;  Laterality: N/A;  . tumor biopsy    . WISDOM TOOTH EXTRACTION      Allergies: Patient has no known allergies.  Medications: Prior to Admission medications   Medication Sig Start Date End Date Taking? Authorizing Provider  acetaminophen (TYLENOL) 500 MG tablet Take 1 tablet (500 mg total) by  mouth every 6 (six) hours as needed. Patient not taking: Reported on 10/21/2016 01/01/16   Marella Chimes, PA-C  albuterol (PROVENTIL HFA;VENTOLIN HFA) 108 [phone] Base) MCG/ACT inhaler Inhale 2 puffs into the lungs every 6 (six) hours as needed for wheezing or shortness of breath. 10/06/16   Alexa Angela Burke, MD  albuterol (PROVENTIL) (2.5 MG/3ML) 0.083% nebulizer solution Take 3 mLs (2.5 mg total) by nebulization every 6 (six) hours as needed for wheezing or shortness of breath. 10/06/16   Florinda Marker, MD  amoxicillin-clavulanate (AUGMENTIN) 875-125 MG tablet Take 1 tablet by mouth 2 (two) times daily. 10/28/16 11/11/16  Bethany Molt, DO  carvedilol (COREG) 12.5 MG tablet Take 1 tablet (12.5 mg total) by mouth 2 (two) times daily with a meal. 09/28/16   Shirley Friar, PA-C  docusate sodium (COLACE) 100 MG capsule Take 100 mg by mouth 2 (two) times daily as needed for mild constipation.    Historical Provider, MD  fluticasone (FLONASE) 50 MCG/ACT nasal spray Place 2 sprays into both nostrils daily. 10/06/16   Florinda Marker, MD  Fluticasone-Salmeterol (ADVAIR) 250-50 MCG/DOSE AEPB Inhale 2 puffs into the lungs 2 (two) times daily. 10/06/16   Alexa Angela Burke, MD  furosemide (LASIX) 40 MG tablet Take 2 tablets (80 mg total) by mouth 2 (two) times daily. 10/28/16 11/01/16  Bethany Molt, DO  gabapentin (NEURONTIN) 800 MG tablet Take 1 tablet (800 mg total) by mouth 3 (three) times daily. 10/06/16   Florinda Marker, MD  guaiFENesin-dextromethorphan (ROBITUSSIN DM) 100-10  MG/5ML syrup Take 5 mLs by mouth every 4 (four) hours as needed for cough. Patient not taking: Reported on 11/04/2016 10/06/16   Alexa Angela Burke, MD  magnesium citrate SOLN Take 0.5-1 Bottles by mouth once as needed for mild constipation.    Historical Provider, MD  Multiple Vitamins-Minerals (MULTIVITAMIN WITH MINERALS) tablet Take 1 tablet by mouth daily.    Historical Provider, MD  Oxycodone HCl 10 MG TABS Take 1 tablet (10 mg total) by mouth every  4 (four) hours as needed. 10/28/16   Bethany Molt, DO  pantoprazole (PROTONIX) 40 MG tablet Take 1 tablet (40 mg total) by mouth daily. 10/06/16   Alexa Angela Burke, MD  potassium chloride SA (K-DUR,KLOR-CON) 20 MEQ tablet Take 40 mEq by mouth daily.    Historical Provider, MD  psyllium (METAMUCIL) 58.6 % packet Take 1 packet by mouth daily as needed (for constipation).    Historical Provider, MD  sacubitril-valsartan (ENTRESTO) 49-51 MG Take 1 tablet by mouth 2 (two) times daily. 09/14/16   Larey Dresser, MD  senna-docusate (SENOKOT-S) 8.6-50 MG tablet Take 2 tablets by mouth at bedtime. Patient not taking: Reported on 11/04/2016 10/28/16   Bethany Molt, DO  simethicone (MYLICON) 80 MG chewable tablet Chew 1 tablet (80 mg total) by mouth every 6 (six) hours as needed for flatulence (gas pains, distended belly). 10/28/16   Bethany Molt, DO  spironolactone (ALDACTONE) 25 MG tablet TAKE 1/2 TABLET BY MOUTH EACH DAY 09/03/16   Amy D Ninfa Meeker, NP     Family History  Problem Relation Age of Onset  . Cancer Mother   . Emphysema Mother   . Bronchitis Mother     Social History   Social History  . Marital status: Single    Spouse name: N/A  . Number of children: N/A  . Years of education: N/A   Social History Main Topics  . Smoking status: Former Smoker    Packs/day: 1.25    Years: 10.00    Types: Cigarettes    Quit date: 04/08/2007  . Smokeless tobacco: Current User    Types: Snuff  . Alcohol use No  . Drug use: No  . Sexual activity: Yes   Other Topics Concern  . Not on file   Social History Narrative  . No narrative on file     Vital Signs: There were no vitals taken for this visit.  Physical Exam  Abdominal: Soft. Bowel sounds are normal.  3 cm area of minimally reddened skin at umbilicus Slightly swollen; slightly tender. No drainage; no sign of infection  JP bulb with 15 cc dark milky yellow OP Site is clean and dry NT no bleeding New dressing placed Flushed with 10 cc sterile  saline; no issues.    CT much improved collection; much smaller collection; less edema. Drain catheter in good position per Dr Annamaria Boots  Imaging: No results found.  Labs:  CBC:  Recent Labs  10/25/16 0830 10/26/16 0213 10/27/16 0409 10/28/16 0707  WBC 7.9 7.1 9.4 10.6*  HGB 7.7* 7.7* 8.7* 8.0*  HCT 25.1* 24.9* 26.9* 25.6*  PLT 383 373 443* 436*    COAGS:  Recent Labs  04/06/16 0209 04/15/16 0709 10/24/16 1415  INR 1.27 1.04 1.30    BMP:  Recent Labs  10/25/16 0830 10/26/16 0213 10/27/16 0409 10/28/16 0707  NA 129* 126* 133* 131*  K 3.6 4.1 3.0* 3.0*  CL 99* 97* 93* 91*  CO2 21* 20* 28 28  GLUCOSE 100* 128* 118*  112*  BUN 26* 21* 19 19  CALCIUM 8.1* 8.1* 8.7* 8.4*  CREATININE 2.14* 1.81* 1.77* 1.53*  GFRNONAA 37* 46* 47* 56*  GFRAA 43* 53* 55* >60    LIVER FUNCTION TESTS:  Recent Labs  04/05/16 2131 10/17/16 1711 10/21/16 1429  BILITOT 0.4 0.3 0.5  AST 27 23 17   ALT 23 15* 13*  ALKPHOS 91 69 63  PROT 6.7 7.7 7.5  ALBUMIN 3.2* 3.3* 3.1*    Assessment:  Post op ventral hernia repair abscess Drain placed 10/24/16 Still with significant OP Dark milky yellow CT much improved--collection not completely resolved Plan to continue daily flushes (Rx given for #14) Return 2 weeks No CT or Inj per Dr Annamaria Boots If OP minimal---pull drain  Signed: Mekiyah Gladwell A 11/05/2016, 3:15 PM   Please refer to Dr. Annamaria Boots attestation of this note for management and plan.

## 2016-11-06 ENCOUNTER — Telehealth: Payer: Self-pay | Admitting: Internal Medicine

## 2016-11-06 NOTE — Telephone Encounter (Signed)
APT. REMINDER CALL, LMTCB °

## 2016-11-09 ENCOUNTER — Ambulatory Visit (INDEPENDENT_AMBULATORY_CARE_PROVIDER_SITE_OTHER): Payer: Medicaid Other | Admitting: Internal Medicine

## 2016-11-09 ENCOUNTER — Ambulatory Visit: Payer: Medicaid Other | Admitting: Family Medicine

## 2016-11-09 VITALS — BP 103/61 | HR 94 | Temp 97.8°F | Ht 72.0 in | Wt 238.2 lb

## 2016-11-09 DIAGNOSIS — E876 Hypokalemia: Secondary | ICD-10-CM | POA: Diagnosis not present

## 2016-11-09 DIAGNOSIS — R918 Other nonspecific abnormal finding of lung field: Secondary | ICD-10-CM

## 2016-11-09 DIAGNOSIS — B9689 Other specified bacterial agents as the cause of diseases classified elsewhere: Secondary | ICD-10-CM

## 2016-11-09 DIAGNOSIS — Z9689 Presence of other specified functional implants: Secondary | ICD-10-CM | POA: Diagnosis not present

## 2016-11-09 DIAGNOSIS — Z8572 Personal history of non-Hodgkin lymphomas: Secondary | ICD-10-CM

## 2016-11-09 DIAGNOSIS — G894 Chronic pain syndrome: Secondary | ICD-10-CM

## 2016-11-09 DIAGNOSIS — Z5189 Encounter for other specified aftercare: Secondary | ICD-10-CM | POA: Diagnosis not present

## 2016-11-09 DIAGNOSIS — I5042 Chronic combined systolic (congestive) and diastolic (congestive) heart failure: Secondary | ICD-10-CM

## 2016-11-09 DIAGNOSIS — T8149XA Infection following a procedure, other surgical site, initial encounter: Secondary | ICD-10-CM

## 2016-11-09 DIAGNOSIS — Z9221 Personal history of antineoplastic chemotherapy: Secondary | ICD-10-CM

## 2016-11-09 DIAGNOSIS — N179 Acute kidney failure, unspecified: Secondary | ICD-10-CM | POA: Diagnosis not present

## 2016-11-09 DIAGNOSIS — L02211 Cutaneous abscess of abdominal wall: Secondary | ICD-10-CM

## 2016-11-09 DIAGNOSIS — Z87891 Personal history of nicotine dependence: Secondary | ICD-10-CM

## 2016-11-09 HISTORY — DX: Acute kidney failure, unspecified: N17.9

## 2016-11-09 MED ORDER — FUROSEMIDE 40 MG PO TABS: 40.0000 mg | ORAL_TABLET | Freq: Two times a day (BID) | ORAL | 5 refills | Status: DC

## 2016-11-09 MED ORDER — OXYCODONE HCL 10 MG PO TABS: 10.0000 mg | ORAL_TABLET | Freq: Four times a day (QID) | ORAL | 0 refills | Status: DC | PRN

## 2016-11-09 MED ORDER — GABAPENTIN 800 MG PO TABS: 800.0000 mg | ORAL_TABLET | Freq: Three times a day (TID) | ORAL | 2 refills | Status: DC

## 2016-11-09 NOTE — Assessment & Plan Note (Addendum)
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 Repeat echocardiogram from recent admission showed improved systolic function, EF 00-86% up from 25-30% in August 2017 and diffuse hypokinesis. Patient received IVFs during his hospitalization due to hypotension/fever. He subsequently developed hypervolemia and his home lasix dose was increased to 80 BID. He was instructed to continue this higher dose x 4 days on discharge then resume his usual dose of 40 mg BID. Today patient reports he is still taking the higher dose of lasix and he is having postural dizziness when standing. Weight is down 245 -> 238 lbs since discharge. Orthostatics were laying 98/57 -> sitting 86/59 ->  standing 87/56 with heart rate 92 -> 96 -> 92 respectively. However, became symptomatic with dizziness upon standing. I am concerned that patient is dehydrated given extended course of high dose lasix. Encouraged patient to increase his fluid intake to 2L over the next 1-2 days and to call our clinic if his is still dizzy after 48 hours.  -- Decrease lasix to 40 mg BID -- Follow up 2-4 weeks

## 2016-11-09 NOTE — Assessment & Plan Note (Addendum)
Patient is here today for hospital follow up. He was discharged from the hospital on 11/05/2016 after being readmitted for an abdominal wall abscess s/p surgical repair of his incarcerated hernia. He had a CT guided IR drain placed on 10/24/2016. He received IV antibiotics and was transitioned to Augmentin on discharge. He was seen in clinic by IR last Thursday at which point his abdominal abscess had collapsed and was nearly resolved by CT. He continued to have exudative output of several cc's a day and the plan was to follow up in 2 weeks for possible drain removal. Today, patient has about 10 cc of purulent appearing fluid in his drain. He denies fevers at home. He has about 8 days left of his Augmentin course.  -- Follow up IR 2 weeks -- Complete Augmentin course  -- Refilled oxy 10 mg q6 x 5 days (#20 tabs)  -- Follow up 2-4 weeks after IR appointment

## 2016-11-09 NOTE — Patient Instructions (Addendum)
Mr. Kutner,  It was a pleasure to see you today. You blood pressure is low and I am concerned this is causing you to be dizzy. I think you may be dehydrated from the higher lasix dose. Please decrease the dose of your lasix back to 40 mg twice a day and increase your fluid intake to 2L a day over the next 1-2 days. If you are still dizzy after 48 hours, please give our clinic a call. I have sent a refill of lasix to your pharmacy. For your abdominal infection, please continue to care for your drain as instructed by radiology. I have refilled your pain medicine for an additional 5 days. Please follow up in 4 weeks after your appointment with radiology to have the drain removed. I will call you with the results of your blood work today. I have also placed a referral for oncology, they will call you to schedule an appointment. If you have any questions or concerns, call our clinic at [phone] or after hours call [phone] and ask for the internal medicine resident on call. Thank you!  - Dr. Philipp Ovens

## 2016-11-09 NOTE — Assessment & Plan Note (Signed)
Refilled gabapentin 800 mg TID.

## 2016-11-09 NOTE — Assessment & Plan Note (Signed)
Irregularly bordered mass like consolidation with calcifications in right lower lobe noted on recent CT abdomen/pelvis. Concerning for a neoplastic process vs scarring given his history of non-hodgkin's lymphoma. Patient previously followed with oncology at [facility] but has not followed up in many years. He wishes to become established here in [place]. Radiology recommended CT chest with contrast 6-9 weeks post discharge.  -- Oncology referral

## 2016-11-09 NOTE — Progress Notes (Signed)
   CC: Hospital follow up   HPI:  Mr.Caedmon Zelek is a 41 y.o. M with past medical history outlined below here for hospital follow up.  For the details of today's visit please refer to the assessment and plan.  Past Medical History:  Diagnosis Date  . Asthma   . Blood transfusion without reported diagnosis   . CHF (congestive heart failure) ([place]) 04/05/2016  . COPD (chronic obstructive pulmonary disease) ([place])   . Hypertension   . Lymphoma ([place])    x 2  . Sleep apnea   . Thyroid disease     Review of Systems:  All pertinents listed in HPI, otherwise negative  Physical Exam:  Vitals:   11/09/16 0957  BP: 103/61  Pulse: 94  Temp: 97.8 F (36.6 C)  TempSrc: Oral  SpO2: 99%  Weight: 238 lb 3.2 oz (108 kg)  Height: 6' (1.829 m)    Constitutional: NAD, appears comfortable Cardiovascular: RRR Pulmonary/Chest: CTAB Abdominal: Soft, non tender, non distended. Hypoactive bowel sounds. Surgical site well bandaged. Drain with ~10 cc of purulent drainage.  Neurological: A&Ox3, CN II - XII grossly intact.    Assessment & Plan:   See Encounters Tab for problem based charting.  Patient discussed with Dr. Lynnae January

## 2016-11-09 NOTE — Assessment & Plan Note (Addendum)
Patient developed hypokalemia and AKI during recent hospitalization in the setting of IV diuresis.  -- Follow up BMP today   ADDENDUM: AKI now resolved and potassium is normal, 4.7. Left voicemail on patient's cell, instructed to call back with any questions.

## 2016-11-09 NOTE — Progress Notes (Signed)
Internal Medicine Clinic Attending  Case discussed with Dr. Philipp Ovens at the time of the visit.  We reviewed the resident's history and exam and pertinent patient test results.  I agree with the assessment, diagnosis, and plan of care documented in the resident's note. Pt clinically is volume contracted but can increase oral intake to avoid IVF. Will be checking Cr to assess renal perfusion. HF appt on 20th March.

## 2016-11-09 NOTE — Addendum Note (Signed)
Addended by: Jodean Lima on: 11/09/2016 04:55 PM   Modules accepted: Orders

## 2016-11-10 ENCOUNTER — Inpatient Hospital Stay (HOSPITAL_COMMUNITY): Admission: RE | Admit: 2016-11-10 | Payer: Medicaid Other | Source: Ambulatory Visit

## 2016-11-10 LAB — BMP8+ANION GAP
Anion Gap: 22 mmol/L — ABNORMAL HIGH (ref 10.0–18.0)
BUN/Creatinine Ratio: 18 (ref 9–20)
BUN: 17 mg/dL (ref 6–20)
CO2: 20 mmol/L (ref 18–29)
Calcium: 8.8 mg/dL (ref 8.7–10.2)
Chloride: 97 mmol/L (ref 96–106)
Creatinine, Ser: 0.96 mg/dL (ref 0.76–1.27)
GFR calc Af Amer: 115 mL/min/{1.73_m2} (ref >59)
GFR, EST NON AFRICAN AMERICAN: 100 mL/min/{1.73_m2} (ref >59)
Glucose: 90 mg/dL (ref 65–99)
Potassium: 4.7 mmol/L (ref 3.5–5.2)
Sodium: 139 mmol/L (ref 134–144)

## 2016-11-12 ENCOUNTER — Encounter: Payer: Self-pay | Admitting: Family Medicine

## 2016-11-13 ENCOUNTER — Telehealth (HOSPITAL_COMMUNITY): Payer: Self-pay

## 2016-11-13 NOTE — Telephone Encounter (Signed)
Leonard [name] saw last, send his way.

## 2016-11-13 NOTE — Telephone Encounter (Signed)
Patient calling CHF clinic triage to request a letter stating his diagnosis, dates of hospital admission, and and ability to work at this time as well as time frame he has either been able to or not been able to work. Will forward to Dr. Aundra Dubin to determine ability to work with time frames and email a letter once addressed to [email or web address] per patient request.  Renee Pain, RN

## 2016-11-16 ENCOUNTER — Encounter: Payer: Self-pay | Admitting: Internal Medicine

## 2016-11-16 ENCOUNTER — Other Ambulatory Visit: Payer: Self-pay | Admitting: Internal Medicine

## 2016-11-16 ENCOUNTER — Other Ambulatory Visit (HOSPITAL_COMMUNITY): Payer: Self-pay | Admitting: Internal Medicine

## 2016-11-16 ENCOUNTER — Telehealth: Payer: Self-pay | Admitting: Internal Medicine

## 2016-11-16 ENCOUNTER — Encounter (HOSPITAL_COMMUNITY): Payer: Self-pay | Admitting: Student

## 2016-11-16 DIAGNOSIS — T8149XA Infection following a procedure, other surgical site, initial encounter: Secondary | ICD-10-CM

## 2016-11-16 MED ORDER — SIMETHICONE 80 MG PO CHEW: 80.0000 mg | CHEWABLE_TABLET | Freq: Four times a day (QID) | ORAL | 2 refills | Status: DC | PRN

## 2016-11-16 NOTE — Telephone Encounter (Signed)
Transition Care Management Follow-up Telephone Call   Date discharged?NO ANSWER WHEN CALLED 3 TIMES   How have you been since you were released from the hospital?    Do you understand why you were in the hospital?    Do you understand the discharge instructions?    Where were you discharged to?   Items Reviewed:  Medications reviewed:   Allergies reviewed:   Dietary changes reviewed:   Referrals reviewed:    Functional Questionnaire:   Activities of Daily Living (ADLs):   He states they are independent in the following:  States they require assistance with the following:    Any transportation issues/concerns?:    Any patient concerns?    Confirmed importance and date/time of follow-up visits scheduled   Provider Appointment booked with  Confirmed with patient if condition begins to worsen call PCP or go to the ER.  Patient was given the office number and encouraged to call back with question or concerns.

## 2016-11-16 NOTE — Telephone Encounter (Signed)
Gave pt dr svalina's message, he is agreeable to calling surgeon's office

## 2016-11-16 NOTE — Telephone Encounter (Signed)
Mr. Barfield asks for refills on oxycodone and simethicone; I have refilled the simethicone.   Patient is newly established to our clinic 2/2 dismissal from [facility] at [facility] where he was receiving hydrocodone for chronic pain from radiation treatment - his UDS was inappropriate by being negative for hydrocodone. At first clinic visit with Dr. Quay Burow he was referred to pain clinic due to having too many red flags for appropriate opioid therapy prescribing through our clinic which I agree with.   In the meantime, patient has been hospitalized and has surgery for an incarcerated hernia (discharged 4/00) complicated by abdominal abscess for which he had a drain placed (hospitalized 2/27-3/7). At discharge he was prescribed short course of oxycodone for post-op pain by our team, which was refilled at hospital follow up appointment. At the time he was instructed to taper this down with help of NSAIDs (on 3/19).  Per [place] controlled substance database, it appears that patient has also received a second oxycodone refill from surgery on 3/14.   Refill history: 2/27 hydrocodone-apap 5-325mg  #20 [facility] 3/14 - prescribed and filled: oxycodone 10mg  #30 - Dr. Reece Agar 3/19 - prescribed and filled: oxycodone 10mg  #20 - [facility]  No further opioid refills will be provided to patient as he has had exercised inappropriately timed refill requests from multiple providers with prior history of inappropriate opioid use.  Patient will be advised that if he is still having surgical site pain, that he needs to follow up with surgery and IR as he may need further evaluation.   For his chronic pain, his referral with pain clinic is still pending.   Alphonzo Grieve, MD IMTS - PGY1 Pager [phone]

## 2016-11-16 NOTE — Telephone Encounter (Signed)
Called and lm for rtc 

## 2016-11-16 NOTE — Telephone Encounter (Signed)
Lft vm to inform the pt that an appt has been scheduled for him to see Dr. Julien Nordmann on 4/16 at 215pm. Letter mailed.

## 2016-11-18 NOTE — Telephone Encounter (Signed)
Received signed domestic return receipt verifying delivery of certified letter on September 11, 2016. Article number [id number] [place]

## 2016-11-19 ENCOUNTER — Ambulatory Visit
Admission: RE | Admit: 2016-11-19 | Discharge: 2016-11-19 | Disposition: A | Discharge status: Home / Self Care | Payer: Medicaid Other | Source: Ambulatory Visit | Attending: General Surgery | Admitting: General Surgery

## 2016-11-19 DIAGNOSIS — L0291 Cutaneous abscess, unspecified: Secondary | ICD-10-CM

## 2016-11-19 HISTORY — PX: IR RADIOLOGIST EVAL & MGMT: IMG5224

## 2016-11-19 NOTE — Progress Notes (Signed)
Chief Complaint: Patient was seen in consultation today for  Chief Complaint  Patient presents with  . Follow-up    abscess drain for removal   at the request of [facility]  Referring Physician(s): Wakefield,Matthew  History of Present Illness: Leonard [name] is a 41 y.o. male returns for reevaluation of his abdominal wall abscess drain. Output has been minimal over the last several days. He has no complaints and denies fevers.  Past Medical History:  Diagnosis Date  . AKI (acute kidney injury) ([place]) 11/09/2016  . Asthma   . Blood transfusion without reported diagnosis   . CHF (congestive heart failure) ([place]) 04/05/2016  . COPD (chronic obstructive pulmonary disease) ([place])   . Hypertension   . Lymphoma ([place])    x 2  . Sleep apnea   . Thyroid disease     Past Surgical History:  Procedure Laterality Date  . CARDIAC CATHETERIZATION N/A 04/15/2016   Procedure: Right/Left Heart Cath and Coronary Angiography;  Surgeon: Larey Dresser, MD;  Location: [facility];  Service: Cardiovascular;  Laterality: N/A;  . LAPAROSCOPIC ASSISTED SPIGELIAN HERNIA REPAIR N/A 10/18/2016   Procedure: LAPAROSCOPIC REPAIR OF UMBILICAL HERNIA  WITH MESH AND LYSIS OF ADHESIONS.;  Surgeon: [name], MD;  Location: [facility];  Service: General;  Laterality: N/A;  . tumor biopsy    . WISDOM TOOTH EXTRACTION      Allergies: Patient has no known allergies.  Medications: Prior to Admission medications   Medication Sig Start Date End Date Taking? Authorizing Provider  acetaminophen (TYLENOL) 500 MG tablet Take 1 tablet (500 mg total) by mouth every 6 (six) hours as needed. Patient not taking: Reported on 10/21/2016 01/01/16   Marella Chimes, PA-C  albuterol (PROVENTIL HFA;VENTOLIN HFA) [phone] Base) MCG/ACT inhaler Inhale 2 puffs into the lungs every 6 (six) hours as needed for wheezing or shortness of breath. 10/06/16   Alexa Angela Burke, MD  albuterol (PROVENTIL) (2.5 MG/3ML)  0.083% nebulizer solution Take 3 mLs (2.5 mg total) by nebulization every 6 (six) hours as needed for wheezing or shortness of breath. 10/06/16   Florinda Marker, MD  carvedilol (COREG) 12.5 MG tablet Take 1 tablet (12.5 mg total) by mouth 2 (two) times daily with a meal. 09/28/16   Shirley Friar, PA-C  carvedilol (COREG) 6.25 MG tablet  09/03/16   Historical Provider, MD  fluticasone (FLONASE) 50 MCG/ACT nasal spray Place 2 sprays into both nostrils daily. 10/06/16   Florinda Marker, MD  Fluticasone-Salmeterol (ADVAIR) 250-50 MCG/DOSE AEPB Inhale 2 puffs into the lungs 2 (two) times daily. 10/06/16   Alexa Angela Burke, MD  furosemide (LASIX) 40 MG tablet Take 1 tablet (40 mg total) by mouth 2 (two) times daily. 11/09/16   Velna Ochs, MD  gabapentin (NEURONTIN) 800 MG tablet Take 1 tablet (800 mg total) by mouth 3 (three) times daily. 11/09/16   Velna Ochs, MD  guaiFENesin-dextromethorphan (ROBITUSSIN DM) 100-10 MG/5ML syrup Take 5 mLs by mouth every 4 (four) hours as needed for cough. Patient not taking: Reported on 11/04/2016 10/06/16   Florinda Marker, MD  Multiple Vitamins-Minerals (MULTIVITAMIN WITH MINERALS) tablet Take 1 tablet by mouth daily.    Historical Provider, MD  Oxycodone HCl 10 MG TABS Take 1 tablet (10 mg total) by mouth every 6 (six) hours as needed. 11/09/16   Velna Ochs, MD  pantoprazole (PROTONIX) 40 MG tablet Take 1 tablet (40 mg total) by mouth daily. 10/06/16   Alexa R  Burns, MD  potassium chloride SA (K-DUR,KLOR-CON) 20 MEQ tablet Take 40 mEq by mouth daily.    Historical Provider, MD  psyllium (METAMUCIL) 58.6 % packet Take 1 packet by mouth daily as needed (for constipation).    Historical Provider, MD  sacubitril-valsartan (ENTRESTO) 49-51 MG Take 1 tablet by mouth 2 (two) times daily. 09/14/16   Larey Dresser, MD  simethicone (MYLICON) 80 MG chewable tablet Chew 1 tablet (80 mg total) by mouth every 6 (six) hours as needed for flatulence (gas pains, distended  belly). 11/16/16   Alphonzo Grieve, MD  spironolactone (ALDACTONE) 25 MG tablet TAKE 1/2 TABLET BY MOUTH EACH DAY 09/03/16   Amy D Ninfa Meeker, NP     Family History  Problem Relation Age of Onset  . Cancer Mother   . Emphysema Mother   . Bronchitis Mother     Social History   Social History  . Marital status: Single    Spouse name: N/A  . Number of children: N/A  . Years of education: N/A   Social History Main Topics  . Smoking status: Former Smoker    Packs/day: 1.25    Years: 10.00    Types: Cigarettes    Quit date: 04/08/2007  . Smokeless tobacco: Current User    Types: Snuff  . Alcohol use No  . Drug use: No  . Sexual activity: Yes   Other Topics Concern  . Not on file   Social History Narrative  . No narrative on file     Review of Systems: A 12 point ROS discussed and pertinent positives are indicated in the HPI above.  All other systems are negative.  Review of Systems  Vital Signs: BP (!) 150/89 (BP Location: Left Arm, Patient Position: Sitting, Cuff Size: Large)   Pulse (!) 108   Temp 98 F (36.7 C) (Oral)   Resp 16   SpO2 99%   Physical Exam   The anterior abdominal wall drain insertion site is clean and dry without signs of superficial infection. There is minimal output in the JP bulb which is clear.  Mallampati Score:     Imaging: Ct Abdomen Pelvis Wo Contrast  Result Date: 10/26/2016 CLINICAL DATA:  Worsening right-sided pain. Patient has drain for abscess status post hernia repair. History of non-Hodgkin's lymphoma. EXAM: CT ABDOMEN AND PELVIS WITHOUT CONTRAST TECHNIQUE: Multidetector CT imaging of the abdomen and pelvis was performed following the standard protocol without IV contrast. COMPARISON:  10/17/2016 CT FINDINGS: Lower chest: Elliptical masslike consolidation with irregular borders and a few scattered punctate calcifications seen within measures approximate 3.5 x 1.9 cm in the periphery of the right lower lobe, grossly unchanged. No  effusion. Hepatobiliary: Stable 4 mm right hepatic hypodensity too small to further characterize but statistically consistent with a cyst or hemangioma. No biliary dilatation. Gallbladder is physiologic in appearance without calculi. Pancreas: Unremarkable. No pancreatic ductal dilatation or surrounding inflammatory changes. Spleen: Normal in size without focal abnormality. Adrenals/Urinary Tract: Adrenal glands are unremarkable. Small 2.5 cm focus of fat along the periphery of the left interpolar kidney possibly an angiomyolipoma or cortical scarring. Bladder is unremarkable. Stomach/Bowel: No bowel obstruction. Enteric contrast noted admixed with stool in the right and left colon. Stomach is nondistended. There is normal small bowel rotation. Vascular/Lymphatic: No significant vascular findings are present. No enlarged abdominal or pelvic lymph nodes. Reproductive: Prostate is unremarkable. Other: There is a new pigtail percutaneous drainage catheter which has decompressed study lower abdominal abscess collection since prior exam. Some  postprocedural air is noted deep to the umbilicus and at site of prior abscess cavity. Mild diffuse subcutaneous induration is noted along the ventral abdominal wall of the lower abdomen and pelvis. No abdominopelvic ascites. Musculoskeletal: No acute or significant osseous findings. IMPRESSION: 1. Percutaneous left lower quadrant approach drainage catheter occupies site of prior lower abdominal abscess cavity. No significant reaccumulation of fluid, abscess nor hematoma noted. 2. Stable typical masslike consolidation with irregular borders and scattered punctate calcifications in the right lower lobe possibly residual from patient's history of non-Hodgkin's lymphoma. This warrants follow-up. 3. Mild diffuse subcutaneous edema/anasarca of the lower abdominal wall and pelvis. 4. Tiny hepatic hypodensity too small to further characterize statistically consistent with a cyst or  hemangioma. 5. Small focus of fat possibly representing an intra mild lipoma of the left interpolar kidney versus cortical scarring. Electronically Signed   By: Ashley Royalty M.D.   On: 10/26/2016 02:02   Ct Abdomen Pelvis Wo Contrast  Result Date: 10/24/2016 CLINICAL DATA:  Right-sided abdominal pain. Fever since hernia repair. EXAM: CT ABDOMEN AND PELVIS WITHOUT CONTRAST TECHNIQUE: Multidetector CT imaging of the abdomen and pelvis was performed following the standard protocol without IV contrast. COMPARISON:  Abdominal CT from 10/17/2016 FINDINGS: Lower chest:  Low-density blood pool consistent with anemia. Band of opacity in the right lower lobe with internal calcifications is stable to mildly decreased in bulk compared to prior. Trace right pleural fluid. Hepatobiliary: Subcentimeter low-density in the central liver is too small for densitometry. There could be layering material within the gallbladder but no calcified gallstone. No inflammatory features. Pancreas: Unremarkable. Spleen: Unremarkable. Adrenals/Urinary Tract: Negative adrenals. No hydronephrosis or stone. Defect in the inferior pole left and upper pole right renal cortex attributed to scarring. Successful decompression of baldder by Foley catheter. Stomach/Bowel: Oral contrast reaches the hepatic flexure. No signs of ileus or obstruction. 15 x 6 x 15 cm gas and fluid collection in the ventral abdomen, extending into the recently repaired umbilical hernia. The neighboring omentum is reticulated and there is a discrete wall. Small volume fluid in the low pelvis and right paracolic gutter. Right paracolic fluid appears loculated and measures up to 5 cm in diameter. Small pneumoperitoneum, not unexpected. No extravasation of oral contrast. Vascular/Lymphatic: No acute vascular abnormality. No mass or adenopathy. Reproductive:No pathologic findings. Musculoskeletal: No acute abnormalities. 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 IMPRESSION: 1. 15 x 5  x 15 cm gas and fluid collection in the ventral abdomen and umbilical hernia site consistent with abscess. 2. Small fluid only collections in the right paracolic gutter and pelvis. 3. No postoperative ileus or obstruction. 4. Bandlike opacity with calcifications in the right lower lobe is stable to decreased in bulk compared to 10/17/2016. Chest CT follow-up is again recommended, suggest waiting 6-8 weeks to allow for resolution of active inflammation. Electronically Signed   By: Monte Fantasia M.D.   On: 10/24/2016 13:29   Dg Abd 1 View  Result Date: 10/22/2016 CLINICAL DATA:  Hernia repair.  Constipation . EXAM: ABDOMEN - 1 VIEW COMPARISON:  10/21/2016. FINDINGS: Soft tissue structures are unremarkable. Moderate stool volume. No prominent bowel distention. Several nondistended air-filled loops of small bowel are noted. No free air. No acute bony abnormality. IMPRESSION: Moderate stool volume.  No bowel distention. Electronically Signed   By: Marcello Moores  Register   On: 10/22/2016 08:32   Ct Abdomen Pelvis W Contrast  Result Date: 11/05/2016 CLINICAL DATA:  Postop abdominal abscess, status post percutaneous drain, outpatient follow-up EXAM: CT ABDOMEN AND  PELVIS WITH CONTRAST TECHNIQUE: Multidetector CT imaging of the abdomen and pelvis was performed using the standard protocol following bolus administration of intravenous contrast. CONTRAST:  160mL ISOVUE-300 IOPAMIDOL (ISOVUE-300) INJECTION 61% COMPARISON:  10/26/2016 FINDINGS: Lower chest: Small residual right pleural effusion. Persistent oval right lower lobe masslike consolidation with tiny punctate calcifications measuring 3.4 x 1.8 cm, unchanged. Normal heart size.  No pericardial effusion.  Left lung bases clear. Hepatobiliary: Stable tiny punctate subcentimeter hepatic hypodensities, suspect small hepatic cysts. No other hepatic abnormality or biliary dilatation. Gallbladder and biliary system unremarkable. Pancreas: Unremarkable. No pancreatic ductal  dilatation or surrounding inflammatory changes. Spleen: Normal in size without focal abnormality. Adrenals/Urinary Tract: Normal adrenal glands. Kidneys demonstrate normal enhancement and excretion. No hydronephrosis or obstruction. Ureters are symmetric and decompressed. Urinary bladder unremarkable. Left kidney cortical scarring again evident laterally. Stomach/Bowel: Negative for bowel obstruction, significant dilatation, ileus, or free air. Postop changes from umbilical hernia repair. Stable percutaneous drain in the anterior abdomen. Previous abscess cavity is nearly collapsed. Trace amount of residual fluid and air within the abscess cavity. Vascular/Lymphatic: No significant vascular findings are present. No enlarged abdominal or pelvic lymph nodes. Reproductive: No acute finding Other: No ascites or new fluid collections. Musculoskeletal: No acute osseous finding. IMPRESSION: Anterior abdominal fluid collection continues to improve and is nearly collapsed about the drain catheter. Trace amount of residual air and fluid within the collapsed abscess cavity. No new fluid collection or abscess. Overall stable appearance of the abdomen and pelvis by CT. Trace right pleural effusion and persistent right lower lobe masslike consolidation with irregularity and punctate calcifications, indeterminate, but may represent residual scarring or round atelectasis from the patient's history of treated non-Hodgkin's lymphoma. Electronically Signed   By: Jerilynn Mages.  Shick M.D.   On: 11/05/2016 15:14   Dg Chest Port 1 View  Result Date: 10/23/2016 CLINICAL DATA:  Fever and abdominal distention. EXAM: PORTABLE CHEST 1 VIEW COMPARISON:  Chest from acute abdomen 10/21/2016 FINDINGS: Very low lung volumes limit assessment. Persistent patchy right basilar opacity, partially obscured. Progressive left perihilar opacities. Unchanged heart size and mediastinal contours. No evidence of pneumothorax or pleural fluid. IMPRESSION: Very low lung  volumes. Persistent right basilar opacity, partially obscured. Development of left perihilar opacities, may be atelectasis. Recommend correlation for aspiration. Electronically Signed   By: Jeb Levering M.D.   On: 10/23/2016 22:00   Dg Abd Acute W/chest  Result Date: 10/21/2016 CLINICAL DATA:  Severe abdominal pain for several days, no bowel movement for more than 1 week, history of recent hernia surgery EXAM: DG ABDOMEN ACUTE W/ 1V CHEST COMPARISON:  CT abdomen pelvis of 10/17/2016 FINDINGS: The minimal haziness remains at the right lung base, also well seen on the upright view of the abdomen suspicious for patchy pneumonia. Pleuroparenchymal scarring in the left upper lobe near the apex is stable. The heart is within normal limits in size. Supine and erect views of the abdomen show scattered air-fluid levels which may represent ileus versus partial small bowel obstruction. No significant distention of small bowel loops is seen on the supine film. No opaque calculi are seen. IMPRESSION: 1. Patchy opacity remains at the right lung base suspicious for a focal area of pneumonia. 2. Scattered air-fluid levels throughout small bowel could indicate ileus versus partial SBO. No free air is seen. Electronically Signed   By: Ivar Drape M.D.   On: 10/21/2016 15:16   Dg Abd Portable 1v  Result Date: 10/23/2016 CLINICAL DATA:  Abdominal distention. EXAM: PORTABLE ABDOMEN -  1 VIEW COMPARISON:  Abdominal radiographs yesterday. FINDINGS: Air-filled colon appears similar to prior exam. Probable gaseous small bowel dilatation in the left abdomen. No gross evidence of free air on supine views. IMPRESSION: Probable gaseous small bowel distention in the left mid abdomen, favoring ileus over obstruction. Air-filled colon is similar. Electronically Signed   By: Jeb Levering M.D.   On: 10/23/2016 22:02   Ct Image Guided Drainage By Percutaneous Catheter  Result Date: 10/25/2016 CLINICAL DATA:  Development of anterior  peritoneal abscess just deep to the abdominal wall 1 week post ventral hernia repair with mesh. EXAM: CT GUIDED CATHETER DRAINAGE OF PERITONEAL ABSCESS ANESTHESIA/SEDATION: 1.0 mg IV Versed 50 mcg IV Fentanyl Total Moderate Sedation Time:  12 minutes The patient's level of consciousness and physiologic status were continuously monitored during the procedure by Radiology nursing. PROCEDURE: The procedure, risks, benefits, and alternatives were explained to the patient. Questions regarding the procedure were encouraged and answered. The patient understands and consents to the procedure. A time out was performed prior to initiating the procedure. The anterior abdominal wall was prepped with chlorhexidine in a sterile fashion, and a sterile drape was applied covering the operative field. A sterile gown and sterile gloves were used for the procedure. Local anesthesia was provided with 1% Lidocaine. From a left anterior approach, an 18 gauge trocar needle was advanced into the anterior abdominal abscess. Fluid aspiration was performed. A guidewire was advanced into the collection. The tract was dilated over the wire and a 12 French percutaneous drainage catheter placed. Additional fluid was withdrawn from the catheter and sent for culture analysis. The catheter was then flushed and connected to a suction bulb. The drainage catheter was secured at the skin with a Prolene retention suture and StatLock device. COMPLICATIONS: None FINDINGS: Fluid return from the anterior abdominal abscess was a cloudy orange color and was foul-smelling. After placement of the drain, there was rapid return of fluid. IMPRESSION: CT-guided percutaneous drain placement within anterior abdominal abscess. A 12 French drain was placed and attached to suction bulb drainage. A fluid sample was sent for culture analysis. Electronically Signed   By: Aletta Edouard M.D.   On: 10/25/2016 08:53    Labs:  CBC:  Recent Labs  10/25/16 0830  10/26/16 0213 10/27/16 0409 10/28/16 0707  WBC 7.9 7.1 9.4 10.6*  HGB 7.7* 7.7* 8.7* 8.0*  HCT 25.1* 24.9* 26.9* 25.6*  PLT 383 373 443* 436*    COAGS:  Recent Labs  04/06/16 0209 04/15/16 0709 10/24/16 1415  INR 1.27 1.04 1.30    BMP:  Recent Labs  10/26/16 0213 10/27/16 0409 10/28/16 0707 11/09/16 1123  NA 126* 133* 131* 139  K 4.1 3.0* 3.0* 4.7  CL 97* 93* 91* 97  CO2 20* 28 28 20   GLUCOSE 128* 118* 112* 90  BUN 21* 19 19 17   CALCIUM 8.1* 8.7* 8.4* 8.8  CREATININE 1.81* 1.77* 1.53* 0.96  GFRNONAA 46* 47* 56* 100  GFRAA 53* 55* >60 115    LIVER FUNCTION TESTS:  Recent Labs  04/05/16 2131 10/17/16 1711 10/21/16 1429  BILITOT 0.4 0.3 0.5  AST 27 23 17   ALT 23 15* 13*  ALKPHOS 91 69 63  PROT 6.7 7.7 7.5  ALBUMIN 3.2* 3.3* 3.1*    TUMOR MARKERS: No results for input(s): AFPTM, CEA, CA199, CHROMGRNA in the last 8760 hours.  Assessment and Plan:  The abscess has resolved and output has resolved. The drain was removed without complication.  Thank you for  this interesting consult.  I greatly enjoyed meeting Isaish Alemu and look forward to participating in their care.  A copy of this report was sent to the requesting provider on this date.  Electronically Signed: Monte Zinni, ART A 11/19/2016, 9:01 AM   I spent a total of   10 Minutes in face to face in clinical consultation, greater than 50% of which was counseling/coordinating care for abscess drain evaluation. Patient ID: Leonard [name], male   DOB: Jan 02, 1976, 41 y.o.   MRN: [id number]

## 2016-11-30 MED FILL — SPIRONOLACTONE 25 MG TABLET: 25 | 34 days supply | Qty: 17 | Fill #2

## 2016-11-30 MED FILL — CARVEDILOL 6.25 MG TABLET: 6.25 | 30 days supply | Qty: 60 | Fill #1

## 2016-11-30 MED FILL — KLOR-CON M20 TABLET: 20 | 30 days supply | Qty: 60 | Fill #3

## 2016-11-30 MED FILL — ENTRESTO 49 MG-51 MG TABLET: 49-51 | 30 days supply | Qty: 60 | Fill #0

## 2016-12-07 ENCOUNTER — Ambulatory Visit: Payer: Medicaid Other | Admitting: Internal Medicine

## 2016-12-07 ENCOUNTER — Encounter: Payer: Self-pay | Admitting: Medical Oncology

## 2016-12-07 ENCOUNTER — Other Ambulatory Visit: Payer: Medicaid Other

## 2016-12-07 ENCOUNTER — Ambulatory Visit: Payer: Medicaid Other

## 2016-12-08 ENCOUNTER — Encounter: Payer: Self-pay | Admitting: Internal Medicine

## 2016-12-09 MED FILL — PANTOPRAZOLE SOD DR 40 MG T: 40 | 30 days supply | Qty: 30 | Fill #0 | Status: TO

## 2016-12-14 NOTE — Addendum Note (Signed)
Addended by: Hulan Fray on: 12/14/2016 07:35 PM   Modules accepted: Orders

## 2016-12-16 MED FILL — GABAPENTIN 800 MG TABLET: 800 | 30 days supply | Qty: 90 | Fill #0

## 2016-12-22 ENCOUNTER — Encounter (HOSPITAL_COMMUNITY): Payer: Medicaid Other

## 2017-01-02 ENCOUNTER — Emergency Department (HOSPITAL_COMMUNITY)
Admission: EM | Admit: 2017-01-02 | Discharge: 2017-01-02 | Disposition: A | Discharge status: Home / Self Care | Payer: Medicaid Other | Attending: Emergency Medicine | Admitting: Emergency Medicine

## 2017-01-02 ENCOUNTER — Emergency Department (HOSPITAL_COMMUNITY): Payer: Medicaid Other

## 2017-01-02 ENCOUNTER — Encounter (HOSPITAL_COMMUNITY): Payer: Self-pay | Admitting: Emergency Medicine

## 2017-01-02 DIAGNOSIS — I5042 Chronic combined systolic (congestive) and diastolic (congestive) heart failure: Secondary | ICD-10-CM | POA: Diagnosis not present

## 2017-01-02 DIAGNOSIS — J449 Chronic obstructive pulmonary disease, unspecified: Secondary | ICD-10-CM | POA: Insufficient documentation

## 2017-01-02 DIAGNOSIS — I110 Hypertensive heart disease with heart failure: Secondary | ICD-10-CM | POA: Insufficient documentation

## 2017-01-02 DIAGNOSIS — Z87891 Personal history of nicotine dependence: Secondary | ICD-10-CM | POA: Diagnosis not present

## 2017-01-02 DIAGNOSIS — J069 Acute upper respiratory infection, unspecified: Secondary | ICD-10-CM | POA: Diagnosis not present

## 2017-01-02 DIAGNOSIS — R05 Cough: Secondary | ICD-10-CM | POA: Diagnosis present

## 2017-01-02 DIAGNOSIS — Z79899 Other long term (current) drug therapy: Secondary | ICD-10-CM | POA: Diagnosis not present

## 2017-01-02 LAB — COMPREHENSIVE METABOLIC PANEL
ALK PHOS: 80 U/L (ref 38–126)
ALT: 493 U/L — AB (ref 17–63)
AST: 490 U/L — AB (ref 15–41)
Albumin: 3.5 g/dL (ref 3.5–5.0)
Anion gap: 7 (ref 5–15)
BUN: 13 mg/dL (ref 6–20)
CHLORIDE: 105 mmol/L (ref 101–111)
CO2: 22 mmol/L (ref 22–32)
CREATININE: 0.89 mg/dL (ref 0.61–1.24)
Calcium: 9 mg/dL (ref 8.9–10.3)
GFR calc Af Amer: >60 mL/min (ref >60)
Glucose, Bld: 107 mg/dL — ABNORMAL HIGH (ref 65–99)
Potassium: 4.3 mmol/L (ref 3.5–5.1)
SODIUM: 134 mmol/L — AB (ref 135–145)
Total Bilirubin: 1 mg/dL (ref 0.3–1.2)
Total Protein: 7.3 g/dL (ref 6.5–8.1)

## 2017-01-02 LAB — I-STAT CG4 LACTIC ACID, ED
LACTIC ACID, VENOUS: 1.45 mmol/L (ref 0.5–1.9)
LACTIC ACID, VENOUS: 1.76 mmol/L (ref 0.5–1.9)

## 2017-01-02 LAB — INFLUENZA PANEL BY PCR (TYPE A & B)
Influenza A By PCR: NEGATIVE
Influenza B By PCR: NEGATIVE

## 2017-01-02 LAB — CBC WITH DIFFERENTIAL/PLATELET
Basophils Absolute: 0 10*3/uL (ref 0.0–0.1)
Basophils Relative: 0 %
EOS ABS: 0 10*3/uL (ref 0.0–0.7)
EOS PCT: 0 %
HCT: 30.7 % — ABNORMAL LOW (ref 39.0–52.0)
Hemoglobin: 9 g/dL — ABNORMAL LOW (ref 13.0–17.0)
LYMPHS ABS: 1.1 10*3/uL (ref 0.7–4.0)
Lymphocytes Relative: 12 %
MCH: 22 pg — AB (ref 26.0–34.0)
MCHC: 29.3 g/dL — AB (ref 30.0–36.0)
MCV: 75.1 fL — ABNORMAL LOW (ref 78.0–100.0)
MONOS PCT: 9 %
Monocytes Absolute: 0.8 10*3/uL (ref 0.1–1.0)
Neutro Abs: 7.3 10*3/uL (ref 1.7–7.7)
Neutrophils Relative %: 79 %
PLATELETS: 510 10*3/uL — AB (ref 150–400)
RBC: 4.09 MIL/uL — ABNORMAL LOW (ref 4.22–5.81)
RDW: 19.1 % — AB (ref 11.5–15.5)
WBC: 9.2 10*3/uL (ref 4.0–10.5)

## 2017-01-02 LAB — ACETAMINOPHEN LEVEL: Acetaminophen (Tylenol), Serum: <10 ug/mL — ABNORMAL LOW (ref 10–30)

## 2017-01-02 MED ORDER — ALBUTEROL (5 MG/ML) CONTINUOUS INHALATION SOLN
10.0000 mg/h | INHALATION_SOLUTION | Freq: Once | RESPIRATORY_TRACT | Status: AC
  Administered 2017-01-02: 10 mg/h via RESPIRATORY_TRACT
  Filled 2017-01-02: qty 20

## 2017-01-02 MED ORDER — CARVEDILOL 12.5 MG PO TABS
12.5000 mg | ORAL_TABLET | Freq: Two times a day (BID) | ORAL | Status: DC
  Administered 2017-01-02: 12.5 mg via ORAL
  Filled 2017-01-02: qty 1

## 2017-01-02 MED ORDER — PREDNISONE 10 MG PO TABS: 50.0000 mg | ORAL_TABLET | Freq: Every day | ORAL | 0 refills | Status: AC

## 2017-01-02 MED ORDER — SODIUM CHLORIDE 0.9 % IV BOLUS (SEPSIS)
1000.0000 mL | Freq: Once | INTRAVENOUS | Status: AC
  Administered 2017-01-02: 1000 mL via INTRAVENOUS

## 2017-01-02 MED ORDER — IBUPROFEN 400 MG PO TABS
600.0000 mg | ORAL_TABLET | Freq: Once | ORAL | Status: AC
  Administered 2017-01-02: 17:00:00 600 mg via ORAL
  Filled 2017-01-02: qty 1

## 2017-01-02 MED ORDER — ALBUTEROL SULFATE HFA 108 (90 BASE) MCG/ACT IN AERS: 1.0000 | INHALATION_SPRAY | RESPIRATORY_TRACT | 0 refills | Status: DC | PRN

## 2017-01-02 MED ORDER — ALBUTEROL SULFATE HFA 108 (90 BASE) MCG/ACT IN AERS
6.0000 | INHALATION_SPRAY | Freq: Once | RESPIRATORY_TRACT | Status: AC
  Administered 2017-01-02: 6 via RESPIRATORY_TRACT
  Filled 2017-01-02: qty 6.7

## 2017-01-02 MED ORDER — IPRATROPIUM BROMIDE 0.02 % IN SOLN
0.5000 mg | Freq: Once | RESPIRATORY_TRACT | Status: AC
  Administered 2017-01-02: 0.5 mg via RESPIRATORY_TRACT
  Filled 2017-01-02: qty 2.5

## 2017-01-02 MED ORDER — BENZONATATE 100 MG PO CAPS: 100.0000 mg | ORAL_CAPSULE | Freq: Three times a day (TID) | ORAL | 0 refills | Status: DC

## 2017-01-02 MED ORDER — PREDNISONE 20 MG PO TABS
60.0000 mg | ORAL_TABLET | Freq: Once | ORAL | Status: AC
  Administered 2017-01-02: 60 mg via ORAL
  Filled 2017-01-02: qty 3

## 2017-01-02 NOTE — ED Notes (Signed)
Pt unable to urinate for sample at this time. Pt given water.

## 2017-01-02 NOTE — ED Provider Notes (Signed)
[facility] DEPT Provider Note   CSN: [id number] Arrival date & time: 01/02/17  1306     History   Chief Complaint Chief Complaint  Patient presents with  . Generalized Body Aches  . Sore Throat  . Cough    HPI Leonard [name] is a 41 y.o. male.  Patient is accompanied a past medical history. History of lymphoma, currently not on any chemotherapy. Also history of CHF with an EF of 40%, COPD, asthma. He has had 3 days of cough, congestion, rhinorrhea, myalgias, nausea, vomiting. Getting progressively worse. Tried using bronchodilators at home with minimal improvement in symptoms.   The history is provided by the patient.  Illness  This is a new problem. Episode onset: 3d ago. The problem occurs constantly. The problem has been gradually worsening. Associated symptoms include shortness of breath. Pertinent negatives include no chest pain, no abdominal pain and no headaches. He has tried nothing for the symptoms.    Past Medical History:  Diagnosis Date  . AKI (acute kidney injury) ([place]) 11/09/2016  . Asthma   . Blood transfusion without reported diagnosis   . CHF (congestive heart failure) ([place]) 04/05/2016  . COPD (chronic obstructive pulmonary disease) ([place])   . Hypertension   . Lymphoma ([place])    x 2  . Sleep apnea   . Thyroid disease     Patient Active Problem List   Diagnosis Date Noted  . AKI (acute kidney injury) ([place]) 11/09/2016  . Abdominal wall abscess at site of surgical wound   . Fever   . Cellulitis 10/22/2016  . Postoperative ileus ([place]) 10/21/2016  . Incarcerated hernia 10/18/2016  . Right lower lobe lung mass 10/18/2016  . Acute on chronic congestive heart failure ([place])   . Incarcerated umbilical hernia   . Asthma 10/06/2016  . History of substance abuse 10/06/2016  . TSH elevation 10/06/2016  . Bronchitis 10/06/2016  . Chronic pain syndrome 10/06/2016  . History of non-Hodgkin's lymphoma 04/30/2016  . Snoring 04/30/2016  . Chronic combined  systolic and diastolic congestive heart failure ([place]) 04/05/2016  . Essential hypertension 10/09/2015    Past Surgical History:  Procedure Laterality Date  . CARDIAC CATHETERIZATION N/A 04/15/2016   Procedure: Right/Left Heart Cath and Coronary Angiography;  Surgeon: Larey Dresser, MD;  Location: [facility];  Service: Cardiovascular;  Laterality: N/A;  . LAPAROSCOPIC ASSISTED SPIGELIAN HERNIA REPAIR N/A 10/18/2016   Procedure: LAPAROSCOPIC REPAIR OF UMBILICAL HERNIA  WITH MESH AND LYSIS OF ADHESIONS.;  Surgeon: [name], MD;  Location: [facility];  Service: General;  Laterality: N/A;  . tumor biopsy    . WISDOM TOOTH EXTRACTION         Home Medications    Prior to Admission medications   Medication Sig Start Date End Date Taking? Authorizing Provider  albuterol (PROVENTIL HFA;VENTOLIN HFA) 108 (90 Base) MCG/ACT inhaler Inhale 2 puffs into the lungs every 6 (six) hours as needed for wheezing or shortness of breath. 10/06/16  Yes Burns, Alexa R, MD  albuterol (PROVENTIL) (2.5 MG/3ML) 0.083% nebulizer solution Take 3 mLs (2.5 mg total) by nebulization every 6 (six) hours as needed for wheezing or shortness of breath. 10/06/16  Yes Burns, Arloa Koh, MD  carvedilol (COREG) 12.5 MG tablet Take 1 tablet (12.5 mg total) by mouth 2 (two) times daily with a meal. 09/28/16  Yes Shirley Friar, PA-C  fluticasone [facility]) 50 MCG/ACT nasal spray Place 2 sprays into both nostrils daily. 10/06/16  Yes Florinda Marker, MD  Fluticasone-Salmeterol (ADVAIR) 250-50 MCG/DOSE AEPB Inhale 2 puffs into the lungs 2 (two) times daily. 10/06/16  Yes Burns, Arloa Koh, MD  furosemide (LASIX) 40 MG tablet Take 1 tablet (40 mg total) by mouth 2 (two) times daily. 11/09/16  Yes Velna Ochs, MD  gabapentin (NEURONTIN) 800 MG tablet Take 1 tablet (800 mg total) by mouth 3 (three) times daily. 11/09/16  Yes Velna Ochs, MD  pantoprazole (PROTONIX) 40 MG tablet Take 1 tablet (40 mg total) by mouth  daily. 10/06/16  Yes Burns, Arloa Koh, MD  potassium chloride SA (K-DUR,KLOR-CON) 20 MEQ tablet Take 40 mEq by mouth 2 (two) times daily.    Yes [provider]  simethicone (MYLICON) 80 MG chewable tablet Chew 1 tablet (80 mg total) by mouth every 6 (six) hours as needed for flatulence (gas pains, distended belly). 11/16/16  Yes Alphonzo Grieve, MD  acetaminophen (TYLENOL) 500 MG tablet Take 1 tablet (500 mg total) by mouth every 6 (six) hours as needed. Patient not taking: Reported on 10/21/2016 01/01/16   Marella Chimes, PA-C  albuterol (PROVENTIL HFA;VENTOLIN HFA) 108 [phone] Base) MCG/ACT inhaler Inhale 1-2 puffs into the lungs every 4 (four) hours as needed for wheezing or shortness of breath. 01/02/17   Maryan Puls, MD  benzonatate (TESSALON) 100 MG capsule Take 1 capsule (100 mg total) by mouth every 8 (eight) hours. 01/02/17   Maryan Puls, MD  carvedilol (COREG) 6.25 MG tablet  09/03/16   [provider]  guaiFENesin-dextromethorphan (ROBITUSSIN DM) 100-10 MG/5ML syrup Take 5 mLs by mouth every 4 (four) hours as needed for cough. Patient not taking: Reported on 11/04/2016 10/06/16   Florinda Marker, MD  Multiple Vitamins-Minerals (MULTIVITAMIN WITH MINERALS) tablet Take 1 tablet by mouth daily.    [provider]  Oxycodone HCl 10 MG TABS Take 1 tablet (10 mg total) by mouth every 6 (six) hours as needed. 11/09/16   Velna Ochs, MD  predniSONE (DELTASONE) 10 MG tablet Take 5 tablets (50 mg total) by mouth daily. 01/02/17 01/07/17  Maryan Puls, MD  psyllium (METAMUCIL) 58.6 % packet Take 1 packet by mouth daily as needed (for constipation).    [provider]  sacubitril-valsartan (ENTRESTO) 49-51 MG Take 1 tablet by mouth 2 (two) times daily. 09/14/16   Larey Dresser, MD  spironolactone (ALDACTONE) 25 MG tablet TAKE 1/2 TABLET BY MOUTH EACH DAY 09/03/16   Clegg, Amy D, NP    Family History Family History  Problem Relation Age of Onset  . Cancer  Mother   . Emphysema Mother   . Bronchitis Mother     Social History Social History  Substance Use Topics  . Smoking status: Former Smoker    Packs/day: 1.25    Years: 10.00    Types: Cigarettes    Quit date: 04/08/2007  . Smokeless tobacco: Current User    Types: Snuff  . Alcohol use No     Allergies   Patient has no known allergies.   Review of Systems Review of Systems  Constitutional: Positive for chills and fatigue. Negative for fever.  HENT: Positive for congestion and rhinorrhea.   Respiratory: Positive for cough, chest tightness, shortness of breath and wheezing.   Cardiovascular: Negative for chest pain and leg swelling.  Gastrointestinal: Positive for nausea and vomiting. Negative for abdominal pain and diarrhea.  Genitourinary: Negative for dysuria.  Musculoskeletal: Negative for back pain, neck pain and neck stiffness.  Skin: Negative for rash.  Neurological: Negative for headaches.  All  other systems reviewed and are negative.    Physical Exam Updated Vital Signs BP 129/81   Pulse 93   Temp 99.4 F (37.4 C) (Oral)   Resp 18   Ht 6' (1.829 m)   Wt 108.9 kg   SpO2 94%   BMI 32.55 kg/m   Physical Exam  Constitutional: He appears well-developed and well-nourished. He appears ill. He appears distressed.  HENT:  Head: Normocephalic and atraumatic.  Nose: Rhinorrhea present.  Mouth/Throat: Mucous membranes are dry.  Eyes: Conjunctivae are normal.  Neck: Neck supple.  Cardiovascular: Normal rate and regular rhythm.   No murmur heard. Pulmonary/Chest: Accessory muscle usage present. Tachypnea noted. He has wheezes. He has rhonchi.  Abdominal: Soft. There is no tenderness.  Musculoskeletal: He exhibits no edema.       Right lower leg: He exhibits no swelling and no edema.       Left lower leg: He exhibits no swelling and no edema.  Neurological: He is alert.  Skin: Skin is warm and dry.  Psychiatric: He has a normal mood and affect.  Nursing note  and vitals reviewed.    ED Treatments / Results  Labs (all labs ordered are listed, but only abnormal results are displayed) Labs Reviewed  COMPREHENSIVE METABOLIC PANEL - Abnormal; Notable for the following:       Result Value   Sodium 134 (*)    Glucose, Bld 107 (*)    AST 490 (*)    ALT 493 (*)    All other components within normal limits  CBC WITH DIFFERENTIAL/PLATELET - Abnormal; Notable for the following:    RBC 4.09 (*)    Hemoglobin 9.0 (*)    HCT 30.7 (*)    MCV 75.1 (*)    MCH 22.0 (*)    MCHC 29.3 (*)    RDW 19.1 (*)    Platelets 510 (*)    All other components within normal limits  ACETAMINOPHEN LEVEL - Abnormal; Notable for the following:    Acetaminophen (Tylenol), Serum <10 (*)    All other components within normal limits  INFLUENZA PANEL BY PCR (TYPE A & B)  URINALYSIS, ROUTINE W REFLEX MICROSCOPIC  HEPATITIS PANEL, ACUTE  I-STAT CG4 LACTIC ACID, ED  I-STAT CG4 LACTIC ACID, ED    EKG  EKG Interpretation None       Radiology Dg Chest 2 View  Result Date: 01/02/2017 CLINICAL DATA:  41 year old male with chest pain and cough for 3 days. 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 The cardiomediastinal silhouette is otherwise unremarkable. A small pleural effusion and mild adjacent atelectasis is identified on the lateral view - not well visualized on the frontal view. There is no evidence of airspace disease, pneumothorax or acute bony abnormality. IMPRESSION: Small pleural effusion and mild adjacent posterior lower lobe atelectasis on the lateral view -not well visualized on the frontal view to determine side. Chronic left upper lobe collapse. Electronically Signed   By: Margarette Canada M.D.   On: 01/02/2017 14:09    Procedures Procedures (including critical care time)  Medications Ordered in ED Medications  carvedilol (COREG) tablet 12.5 mg (12.5 mg Oral  Given 01/02/17 1826)  albuterol (PROVENTIL,VENTOLIN) solution continuous neb (10 mg/hr Nebulization Given 01/02/17 1714)  ipratropium (ATROVENT) nebulizer solution 0.5 mg (0.5 mg Nebulization Given 01/02/17 1653)  predniSONE (DELTASONE) tablet 60 mg (60 mg Oral Given 01/02/17 1649)  sodium  chloride 0.9 % bolus 1,000 mL (0 mLs Intravenous Stopped 01/02/17 1810)  ibuprofen (ADVIL,MOTRIN) tablet 600 mg (600 mg Oral Given 01/02/17 1649)  albuterol (PROVENTIL HFA;VENTOLIN HFA) 108 (90 Base) MCG/ACT inhaler 6 puff (6 puffs Inhalation Given 01/02/17 1826)     Initial Impression / Assessment and Plan / ED Course  I have reviewed the triage vital signs and the nursing notes.  Pertinent labs & imaging results that were available during my care of the patient were reviewed by me and considered in my medical decision making (see chart for details).     On initial arrival here, patient did appear uncomfortable. Nearly febrile, tachycardic. Diffusely wheezing with rhonchorous breath sounds on exam. Chest x-ray without evidence of pneumothorax or pneumonia. Gave the patient some bronchodilators and steroids with improvement in symptoms. Gave him some IV fluids as well. Of note, the patient was initially tachycardic into the 120s. Gave him a dose of his home Coreg with improvement of heart rate into the 90s.  He reports that this is consistent with his baseline heart rate, and this seems to be consistent with his baseline heart rate on chart review. Lactate negative. Anemia is consistent with his baseline. No leukocytosis. Likely other viral syndrome. He did have elevation in his LFTs into the 400s. Tylenol level negative. Sent off an acute hepatitis viral panel. Told the patient should there be any acute concerning findings, he will be contacted. Gave him a prescription for albuterol, steroids, Tessalon Perles. Encouraged him to take Motrin and Tylenol at home for symptomatic control. Told him to follow up with his  primary care doctor soon as possible.  Final Clinical Impressions(s) / ED Diagnoses   Final diagnoses:  Upper respiratory tract infection, unspecified type    New Prescriptions Discharge Medication List as of 01/02/2017  8:27 PM    START taking these medications   Details  !! albuterol (PROVENTIL HFA;VENTOLIN HFA) 108 (90 Base) MCG/ACT inhaler Inhale 1-2 puffs into the lungs every 4 (four) hours as needed for wheezing or shortness of breath., Starting Sat 01/02/2017, Print    benzonatate (TESSALON) 100 MG capsule Take 1 capsule (100 mg total) by mouth every 8 (eight) hours., Starting Sat 01/02/2017, Print    predniSONE (DELTASONE) 10 MG tablet Take 5 tablets (50 mg total) by mouth daily., Starting Sat 01/02/2017, Until Thu 01/07/2017, Print     !! - Potential duplicate medications found. Please discuss with provider.       Maryan Puls, MD 01/02/17 2119    Carmin Muskrat, MD 01/03/17 [phone]

## 2017-01-02 NOTE — ED Triage Notes (Signed)
Pt c/o flu like sx- body aches, sore throat, cough, no energy- chills and sweats. Has hx of Ca--  In remission at present.

## 2017-01-03 LAB — HEPATITIS PANEL, ACUTE
HCV Ab: <0.1 s/co ratio (ref 0.0–0.9)
HEP A IGM: NEGATIVE
HEP B C IGM: NEGATIVE
Hepatitis B Surface Ag: NEGATIVE

## 2017-01-06 NOTE — Telephone Encounter (Signed)
Contacted pt again this week for visit and it went to vm. He reported last to zack that he has been working and he is only available a few days during the week and his wife does all the scheduling of his appointments. Will attempt another call.   01/06/17 Marylouise Stacks, EMT-paramedic

## 2017-01-13 ENCOUNTER — Encounter (HOSPITAL_COMMUNITY): Payer: Self-pay | Admitting: Emergency Medicine

## 2017-01-13 ENCOUNTER — Emergency Department (HOSPITAL_COMMUNITY): Payer: Medicaid Other

## 2017-01-13 ENCOUNTER — Emergency Department (HOSPITAL_COMMUNITY)
Admission: EM | Admit: 2017-01-13 | Discharge: 2017-01-13 | Disposition: A | Discharge status: Home / Self Care | Payer: Medicaid Other | Attending: Emergency Medicine | Admitting: Emergency Medicine

## 2017-01-13 DIAGNOSIS — R42 Dizziness and giddiness: Secondary | ICD-10-CM | POA: Diagnosis present

## 2017-01-13 DIAGNOSIS — I110 Hypertensive heart disease with heart failure: Secondary | ICD-10-CM | POA: Diagnosis not present

## 2017-01-13 DIAGNOSIS — Z79899 Other long term (current) drug therapy: Secondary | ICD-10-CM | POA: Diagnosis not present

## 2017-01-13 DIAGNOSIS — I5042 Chronic combined systolic (congestive) and diastolic (congestive) heart failure: Secondary | ICD-10-CM | POA: Diagnosis not present

## 2017-01-13 DIAGNOSIS — J449 Chronic obstructive pulmonary disease, unspecified: Secondary | ICD-10-CM | POA: Diagnosis not present

## 2017-01-13 DIAGNOSIS — Z87891 Personal history of nicotine dependence: Secondary | ICD-10-CM | POA: Insufficient documentation

## 2017-01-13 LAB — COMPREHENSIVE METABOLIC PANEL
ALBUMIN: 3.4 g/dL — AB (ref 3.5–5.0)
ALT: 35 U/L (ref 17–63)
ANION GAP: 9 (ref 5–15)
AST: 24 U/L (ref 15–41)
Alkaline Phosphatase: 76 U/L (ref 38–126)
BUN: 15 mg/dL (ref 6–20)
CO2: 23 mmol/L (ref 22–32)
Calcium: 8.8 mg/dL — ABNORMAL LOW (ref 8.9–10.3)
Chloride: 106 mmol/L (ref 101–111)
Creatinine, Ser: 1.25 mg/dL — ABNORMAL HIGH (ref 0.61–1.24)
Glucose, Bld: 100 mg/dL — ABNORMAL HIGH (ref 65–99)
POTASSIUM: 3.6 mmol/L (ref 3.5–5.1)
Sodium: 138 mmol/L (ref 135–145)
TOTAL PROTEIN: 7.4 g/dL (ref 6.5–8.1)
Total Bilirubin: 0.4 mg/dL (ref 0.3–1.2)

## 2017-01-13 LAB — I-STAT CHEM 8, ED
BUN: 17 mg/dL (ref 6–20)
CHLORIDE: 103 mmol/L (ref 101–111)
Calcium, Ion: 1.13 mmol/L — ABNORMAL LOW (ref 1.15–1.40)
Creatinine, Ser: 1.3 mg/dL — ABNORMAL HIGH (ref 0.61–1.24)
Glucose, Bld: 95 mg/dL (ref 65–99)
HEMATOCRIT: 37 % — AB (ref 39.0–52.0)
HEMOGLOBIN: 12.6 g/dL — AB (ref 13.0–17.0)
POTASSIUM: 3.5 mmol/L (ref 3.5–5.1)
Sodium: 141 mmol/L (ref 135–145)
TCO2: 27 mmol/L (ref 0–100)

## 2017-01-13 LAB — CBC
HEMATOCRIT: 36.8 % — AB (ref 39.0–52.0)
Hemoglobin: 10.6 g/dL — ABNORMAL LOW (ref 13.0–17.0)
MCH: 21.4 pg — ABNORMAL LOW (ref 26.0–34.0)
MCHC: 28.8 g/dL — AB (ref 30.0–36.0)
MCV: 74.2 fL — AB (ref 78.0–100.0)
Platelets: 575 10*3/uL — ABNORMAL HIGH (ref 150–400)
RBC: 4.96 MIL/uL (ref 4.22–5.81)
RDW: 18.7 % — AB (ref 11.5–15.5)
WBC: 9.8 10*3/uL (ref 4.0–10.5)

## 2017-01-13 LAB — I-STAT CG4 LACTIC ACID, ED: Lactic Acid, Venous: 1.32 mmol/L (ref 0.5–1.9)

## 2017-01-13 LAB — I-STAT TROPONIN, ED: TROPONIN I, POC: 0.01 ng/mL (ref 0.00–0.08)

## 2017-01-13 NOTE — ED Triage Notes (Signed)
Arrived via EMS. Patient states onset one day ago developed intermittent dizziness and worsening today. States out of home medication for a couple of days and continued them one day ago. Alert answering and following commands appropriate. States used cocaine 4 days ago.

## 2017-01-13 NOTE — ED Notes (Signed)
Pt ambulates up to nursing desk and reports that his PCP is going to adjust his medication and he is requesting to sign out AMA.

## 2017-01-13 NOTE — ED Provider Notes (Signed)
[facility] DEPT Provider Note   CSN: [id number] Arrival date & time: 01/13/17  1003     History   Chief Complaint Chief Complaint  Patient presents with  . Dizziness    HPI Leonard [name] is a 41 y.o. male.  The history is provided by the patient. No language interpreter was used.  Dizziness  Quality:  Lightheadedness Severity:  Moderate Onset quality:  Gradual Timing:  Constant Progression:  Worsening Chronicity:  New Context: not with inactivity   Relieved by:  Nothing Worsened by:  Nothing Ineffective treatments:  None tried Associated symptoms: no vomiting   Risk factors: no anemia     Past Medical History:  Diagnosis Date  . AKI (acute kidney injury) ([place]) 11/09/2016  . Asthma   . Blood transfusion without reported diagnosis   . CHF (congestive heart failure) ([place]) 04/05/2016  . COPD (chronic obstructive pulmonary disease) ([place])   . Hypertension   . Lymphoma ([place])    x 2  . Sleep apnea   . Thyroid disease     Patient Active Problem List   Diagnosis Date Noted  . AKI (acute kidney injury) ([place]) 11/09/2016  . Abdominal wall abscess at site of surgical wound   . Fever   . Cellulitis 10/22/2016  . Postoperative ileus ([place]) 10/21/2016  . Incarcerated hernia 10/18/2016  . Right lower lobe lung mass 10/18/2016  . Acute on chronic congestive heart failure ([place])   . Incarcerated umbilical hernia   . Asthma 10/06/2016  . History of substance abuse 10/06/2016  . TSH elevation 10/06/2016  . Bronchitis 10/06/2016  . Chronic pain syndrome 10/06/2016  . History of non-Hodgkin's lymphoma 04/30/2016  . Snoring 04/30/2016  . Chronic combined systolic and diastolic congestive heart failure ([place]) 04/05/2016  . Essential hypertension 10/09/2015    Past Surgical History:  Procedure Laterality Date  . CARDIAC CATHETERIZATION N/A 04/15/2016   Procedure: Right/Left Heart Cath and Coronary Angiography;  Surgeon: Larey Dresser, MD;  Location: [facility];   Service: Cardiovascular;  Laterality: N/A;  . LAPAROSCOPIC ASSISTED SPIGELIAN HERNIA REPAIR N/A 10/18/2016   Procedure: LAPAROSCOPIC REPAIR OF UMBILICAL HERNIA  WITH MESH AND LYSIS OF ADHESIONS.;  Surgeon: [name], MD;  Location: [facility];  Service: General;  Laterality: N/A;  . tumor biopsy    . WISDOM TOOTH EXTRACTION         Home Medications    Prior to Admission medications   Medication Sig Start Date End Date Taking? Authorizing Provider  acetaminophen (TYLENOL) 500 MG tablet Take 1 tablet (500 mg total) by mouth every 6 (six) hours as needed. Patient not taking: Reported on 10/21/2016 01/01/16   Marella Chimes, PA-C  albuterol (PROVENTIL HFA;VENTOLIN HFA) 108 [phone] Base) MCG/ACT inhaler Inhale 2 puffs into the lungs every 6 (six) hours as needed for wheezing or shortness of breath. 10/06/16   Burns, Arloa Koh, MD  albuterol (PROVENTIL HFA;VENTOLIN HFA) 108 (90 Base) MCG/ACT inhaler Inhale 1-2 puffs into the lungs every 4 (four) hours as needed for wheezing or shortness of breath. 01/02/17   Maryan Puls, MD  albuterol (PROVENTIL) (2.5 MG/3ML) 0.083% nebulizer solution Take 3 mLs (2.5 mg total) by nebulization every 6 (six) hours as needed for wheezing or shortness of breath. 10/06/16   Burns, Arloa Koh, MD  benzonatate (TESSALON) 100 MG capsule Take 1 capsule (100 mg total) by mouth every 8 (eight) hours. 01/02/17   Maryan Puls, MD  carvedilol (COREG) 12.5 MG tablet Take 1 tablet (12.5  mg total) by mouth 2 (two) times daily with a meal. 09/28/16   Shirley Friar, PA-C  carvedilol (COREG) 6.25 MG tablet  09/03/16   [provider]  fluticasone (FLONASE) 50 MCG/ACT nasal spray Place 2 sprays into both nostrils daily. 10/06/16   Burns, Arloa Koh, MD  Fluticasone-Salmeterol (ADVAIR) 250-50 MCG/DOSE AEPB Inhale 2 puffs into the lungs 2 (two) times daily. 10/06/16   Burns, Arloa Koh, MD  furosemide (LASIX) 40 MG tablet Take 1 tablet (40 mg total) by mouth 2 (two) times  daily. 11/09/16   Velna Ochs, MD  gabapentin (NEURONTIN) 800 MG tablet Take 1 tablet (800 mg total) by mouth 3 (three) times daily. 11/09/16   Velna Ochs, MD  guaiFENesin-dextromethorphan (ROBITUSSIN DM) 100-10 MG/5ML syrup Take 5 mLs by mouth every 4 (four) hours as needed for cough. Patient not taking: Reported on 11/04/2016 10/06/16   Florinda Marker, MD  Multiple Vitamins-Minerals (MULTIVITAMIN WITH MINERALS) tablet Take 1 tablet by mouth daily.    [provider]  Oxycodone HCl 10 MG TABS Take 1 tablet (10 mg total) by mouth every 6 (six) hours as needed. 11/09/16   Velna Ochs, MD  pantoprazole (PROTONIX) 40 MG tablet Take 1 tablet (40 mg total) by mouth daily. 10/06/16   Burns, Arloa Koh, MD  potassium chloride SA (K-DUR,KLOR-CON) 20 MEQ tablet Take 40 mEq by mouth 2 (two) times daily.     [provider]  psyllium (METAMUCIL) 58.6 % packet Take 1 packet by mouth daily as needed (for constipation).    [provider]  sacubitril-valsartan (ENTRESTO) 49-51 MG Take 1 tablet by mouth 2 (two) times daily. 09/14/16   Larey Dresser, MD  simethicone (MYLICON) 80 MG chewable tablet Chew 1 tablet (80 mg total) by mouth every 6 (six) hours as needed for flatulence (gas pains, distended belly). 11/16/16   Alphonzo Grieve, MD  spironolactone (ALDACTONE) 25 MG tablet TAKE 1/2 TABLET BY MOUTH EACH DAY 09/03/16   Clegg, Amy D, NP    Family History Family History  Problem Relation Age of Onset  . Cancer Mother   . Emphysema Mother   . Bronchitis Mother     Social History Social History  Substance Use Topics  . Smoking status: Former Smoker    Packs/day: 1.25    Years: 10.00    Types: Cigarettes    Quit date: 04/08/2007  . Smokeless tobacco: Current User    Types: Snuff  . Alcohol use No     Allergies   Patient has no known allergies.   Review of Systems Review of Systems  Gastrointestinal: Negative for vomiting.  Neurological: Positive for  dizziness.  All other systems reviewed and are negative.    Physical Exam Updated Vital Signs BP 110/76   Pulse 81   Temp 97.8 F (36.6 C)   Resp 16   Ht 6' (1.829 m)   Wt 110.7 kg (244 lb)   SpO2 95%   BMI 33.09 kg/m   Physical Exam  Constitutional: He appears well-developed and well-nourished.  HENT:  Head: Normocephalic and atraumatic.  Right Ear: External ear normal.  Left Ear: External ear normal.  Nose: Nose normal.  Mouth/Throat: Oropharynx is clear and moist.  Eyes: Conjunctivae are normal.  Neck: Normal range of motion. Neck supple.  Cardiovascular: Normal rate and regular rhythm.   No murmur heard. Pulmonary/Chest: Effort normal and breath sounds normal. No respiratory distress.  Abdominal: Soft. There is no tenderness.  Musculoskeletal: Normal range of  motion. He exhibits no edema.  Neurological: He is alert.  Skin: Skin is warm and dry.  Psychiatric: He has a normal mood and affect.  Nursing note and vitals reviewed.    ED Treatments / Results  Labs (all labs ordered are listed, but only abnormal results are displayed) Labs Reviewed  CBC - Abnormal; Notable for the following:       Result Value   Hemoglobin 10.6 (*)    HCT 36.8 (*)    MCV 74.2 (*)    MCH 21.4 (*)    MCHC 28.8 (*)    RDW 18.7 (*)    Platelets 575 (*)    All other components within normal limits  COMPREHENSIVE METABOLIC PANEL - Abnormal; Notable for the following:    Glucose, Bld 100 (*)    Creatinine, Ser 1.25 (*)    Calcium 8.8 (*)    Albumin 3.4 (*)    All other components within normal limits  I-STAT CHEM 8, ED - Abnormal; Notable for the following:    Creatinine, Ser 1.30 (*)    Calcium, Ion 1.13 (*)    Hemoglobin 12.6 (*)    HCT 37.0 (*)    All other components within normal limits  URINALYSIS, ROUTINE W REFLEX MICROSCOPIC  I-STAT TROPOININ, ED  I-STAT CG4 LACTIC ACID, ED    EKG  EKG Interpretation None       Radiology Dg Chest Portable 1 View  Result  Date: 01/13/2017 CLINICAL DATA:  Dizziness EXAM: PORTABLE CHEST 1 VIEW COMPARISON:  01/02/2017 FINDINGS: Cardiac shadow is stable. Chronic left upper lobe collapse is again identified and stable. This is been seen on multiple previous exams dating back to February 2017. No for new focal infiltrate or sizable effusion is seen. No acute bony abnormality is noted. IMPRESSION: Chronic left upper lobe collapse. No acute abnormality is seen. Electronically Signed   By: Inez Catalina M.D.   On: 01/13/2017 11:25    Procedures Procedures (including critical care time)  Medications Ordered in ED Medications - No data to display   Initial Impression / Assessment and Plan / ED Course  I have reviewed the triage vital signs and the nursing notes.  Pertinent labs & imaging results that were available during my care of the patient were reviewed by me and considered in my medical decision making (see chart for details).     Pt admits to recently using cocaine.  Pt admits he thinks it interfers with his medications.  Final Clinical Impressions(s) / ED Diagnoses   Final diagnoses:  Dizziness    New Prescriptions New Prescriptions   No medications on file   Pt advised his MD needs to review his medications.  Pt advised he needs to avoid cocaine use.  Pt left ama before discharge    Sidney Ace 01/13/17 1216    Milton Ferguson, MD 01/15/17 [phone]

## 2017-01-13 NOTE — Discharge Instructions (Signed)
See your Physician for recheck of your medications.  I suspect carvedilol may cause your sensation of dizziness.  Ask your Physician to review your medications

## 2017-01-15 ENCOUNTER — Encounter (HOSPITAL_COMMUNITY): Payer: Self-pay | Admitting: Emergency Medicine

## 2017-01-15 ENCOUNTER — Emergency Department (HOSPITAL_COMMUNITY): Payer: Medicaid Other

## 2017-01-15 DIAGNOSIS — M79674 Pain in right toe(s): Secondary | ICD-10-CM | POA: Diagnosis not present

## 2017-01-15 DIAGNOSIS — F17210 Nicotine dependence, cigarettes, uncomplicated: Secondary | ICD-10-CM | POA: Insufficient documentation

## 2017-01-15 DIAGNOSIS — Z8572 Personal history of non-Hodgkin lymphomas: Secondary | ICD-10-CM | POA: Diagnosis not present

## 2017-01-15 DIAGNOSIS — I5042 Chronic combined systolic (congestive) and diastolic (congestive) heart failure: Secondary | ICD-10-CM | POA: Diagnosis not present

## 2017-01-15 DIAGNOSIS — J45909 Unspecified asthma, uncomplicated: Secondary | ICD-10-CM | POA: Insufficient documentation

## 2017-01-15 DIAGNOSIS — I110 Hypertensive heart disease with heart failure: Secondary | ICD-10-CM | POA: Diagnosis not present

## 2017-01-15 DIAGNOSIS — R55 Syncope and collapse: Secondary | ICD-10-CM | POA: Diagnosis present

## 2017-01-15 DIAGNOSIS — Z79899 Other long term (current) drug therapy: Secondary | ICD-10-CM | POA: Diagnosis not present

## 2017-01-15 DIAGNOSIS — M25511 Pain in right shoulder: Secondary | ICD-10-CM | POA: Diagnosis not present

## 2017-01-15 LAB — CBC
HCT: 34 % — ABNORMAL LOW (ref 39.0–52.0)
Hemoglobin: 9.7 g/dL — ABNORMAL LOW (ref 13.0–17.0)
MCH: 21.2 pg — AB (ref 26.0–34.0)
MCHC: 28.5 g/dL — ABNORMAL LOW (ref 30.0–36.0)
MCV: 74.4 fL — ABNORMAL LOW (ref 78.0–100.0)
PLATELETS: 567 10*3/uL — AB (ref 150–400)
RBC: 4.57 MIL/uL (ref 4.22–5.81)
RDW: 18.6 % — AB (ref 11.5–15.5)
WBC: 9.5 10*3/uL (ref 4.0–10.5)

## 2017-01-15 LAB — BASIC METABOLIC PANEL
Anion gap: 8 (ref 5–15)
BUN: 10 mg/dL (ref 6–20)
CALCIUM: 8.9 mg/dL (ref 8.9–10.3)
CO2: 26 mmol/L (ref 22–32)
Chloride: 102 mmol/L (ref 101–111)
Creatinine, Ser: 1.09 mg/dL (ref 0.61–1.24)
GFR calc Af Amer: >60 mL/min (ref >60)
GFR calc non Af Amer: >60 mL/min (ref >60)
GLUCOSE: 98 mg/dL (ref 65–99)
Potassium: 4.1 mmol/L (ref 3.5–5.1)
Sodium: 136 mmol/L (ref 135–145)

## 2017-01-15 NOTE — ED Triage Notes (Signed)
Pt has syncopal episode last night after being seen for dizziness related to his new medications yeserday.  States he bent over to pick something up, stood up and became dizzy, and then passed out.  When he woke up he was alert and oriented, but states he has been having right shoulder pain, right head pain, and right great toe pain (at the base of the toe).  Redness and some bruising noted.

## 2017-01-16 ENCOUNTER — Emergency Department (HOSPITAL_COMMUNITY)
Admission: EM | Admit: 2017-01-16 | Discharge: 2017-01-16 | Disposition: A | Discharge status: Home / Self Care | Payer: Medicaid Other | Attending: Emergency Medicine | Admitting: Emergency Medicine

## 2017-01-16 DIAGNOSIS — M79674 Pain in right toe(s): Secondary | ICD-10-CM

## 2017-01-16 DIAGNOSIS — M25511 Pain in right shoulder: Secondary | ICD-10-CM

## 2017-01-16 NOTE — ED Notes (Signed)
Pt and all belongings are gone from the room. Pt did not receive discharge paperwork or post op shoe prior to discharge

## 2017-01-16 NOTE — ED Provider Notes (Signed)
[facility] DEPT Provider Note   CSN: [id number] Arrival date & time: 01/15/17  1943     History   Chief Complaint Chief Complaint  Patient presents with  . Loss of Consciousness  . Toe Pain  . Shoulder Pain    HPI Leonard [name] is a 41 y.o. male.  Patient presents to the emergency department with chief complaint of right shoulder and right toe pain. He states that yesterday he was seen here after he passed out. He states that he was bending over to catch the dog, and when he stood up quickly he passed out. He states that he is experiences episodes before, and is currently seeing a cardiologist. He states that he notified his cardiologist of the episode, and was advised to hold his recently adjusted blood pressure medications. Patient states that he has not felt dizzy since then. He denies any chest pain or shortness of breath. Denies any numbness, weakness, or tingling. He states that he is mainly here for his right great toe pain. He also reports having right shoulder pain from the fall. He has tried taking ibuprofen with some relief. There are no other associated symptoms.   The history is provided by the patient. No language interpreter was used.    Past Medical History:  Diagnosis Date  . AKI (acute kidney injury) ([place]) 11/09/2016  . Asthma   . Blood transfusion without reported diagnosis   . CHF (congestive heart failure) ([place]) 04/05/2016  . COPD (chronic obstructive pulmonary disease) ([place])   . Hypertension   . Lymphoma ([place])    x 2  . Sleep apnea   . Thyroid disease     Patient Active Problem List   Diagnosis Date Noted  . AKI (acute kidney injury) ([place]) 11/09/2016  . Abdominal wall abscess at site of surgical wound   . Fever   . Cellulitis 10/22/2016  . Postoperative ileus ([place]) 10/21/2016  . Incarcerated hernia 10/18/2016  . Right lower lobe lung mass 10/18/2016  . Acute on chronic congestive heart failure ([place])   . Incarcerated umbilical hernia   .  Asthma 10/06/2016  . History of substance abuse 10/06/2016  . TSH elevation 10/06/2016  . Bronchitis 10/06/2016  . Chronic pain syndrome 10/06/2016  . History of non-Hodgkin's lymphoma 04/30/2016  . Snoring 04/30/2016  . Chronic combined systolic and diastolic congestive heart failure ([place]) 04/05/2016  . Essential hypertension 10/09/2015    Past Surgical History:  Procedure Laterality Date  . CARDIAC CATHETERIZATION N/A 04/15/2016   Procedure: Right/Left Heart Cath and Coronary Angiography;  Surgeon: Larey Dresser, MD;  Location: [facility];  Service: Cardiovascular;  Laterality: N/A;  . LAPAROSCOPIC ASSISTED SPIGELIAN HERNIA REPAIR N/A 10/18/2016   Procedure: LAPAROSCOPIC REPAIR OF UMBILICAL HERNIA  WITH MESH AND LYSIS OF ADHESIONS.;  Surgeon: [name], MD;  Location: [facility];  Service: General;  Laterality: N/A;  . tumor biopsy    . WISDOM TOOTH EXTRACTION         Home Medications    Prior to Admission medications   Medication Sig Start Date End Date Taking? Authorizing Provider  acetaminophen (TYLENOL) 500 MG tablet Take 1 tablet (500 mg total) by mouth every 6 (six) hours as needed. Patient not taking: Reported on 10/21/2016 01/01/16   Marella Chimes, PA-C  albuterol (PROVENTIL HFA;VENTOLIN HFA) 108 [phone] Base) MCG/ACT inhaler Inhale 2 puffs into the lungs every 6 (six) hours as needed for wheezing or shortness of breath. 10/06/16   Burns, Alexa  R, MD  albuterol (PROVENTIL HFA;VENTOLIN HFA) 108 (90 Base) MCG/ACT inhaler Inhale 1-2 puffs into the lungs every 4 (four) hours as needed for wheezing or shortness of breath. 01/02/17   Maryan Puls, MD  albuterol (PROVENTIL) (2.5 MG/3ML) 0.083% nebulizer solution Take 3 mLs (2.5 mg total) by nebulization every 6 (six) hours as needed for wheezing or shortness of breath. 10/06/16   Burns, Arloa Koh, MD  benzonatate (TESSALON) 100 MG capsule Take 1 capsule (100 mg total) by mouth every 8 (eight) hours. 01/02/17    Maryan Puls, MD  carvedilol (COREG) 12.5 MG tablet Take 1 tablet (12.5 mg total) by mouth 2 (two) times daily with a meal. 09/28/16   Shirley Friar, PA-C  carvedilol (COREG) 6.25 MG tablet  09/03/16   [provider]  fluticasone (FLONASE) 50 MCG/ACT nasal spray Place 2 sprays into both nostrils daily. 10/06/16   Burns, Arloa Koh, MD  Fluticasone-Salmeterol (ADVAIR) 250-50 MCG/DOSE AEPB Inhale 2 puffs into the lungs 2 (two) times daily. 10/06/16   Burns, Arloa Koh, MD  furosemide (LASIX) 40 MG tablet Take 1 tablet (40 mg total) by mouth 2 (two) times daily. 11/09/16   Velna Ochs, MD  gabapentin (NEURONTIN) 800 MG tablet Take 1 tablet (800 mg total) by mouth 3 (three) times daily. 11/09/16   Velna Ochs, MD  guaiFENesin-dextromethorphan (ROBITUSSIN DM) 100-10 MG/5ML syrup Take 5 mLs by mouth every 4 (four) hours as needed for cough. Patient not taking: Reported on 11/04/2016 10/06/16   Florinda Marker, MD  Multiple Vitamins-Minerals (MULTIVITAMIN WITH MINERALS) tablet Take 1 tablet by mouth daily.    [provider]  Oxycodone HCl 10 MG TABS Take 1 tablet (10 mg total) by mouth every 6 (six) hours as needed. 11/09/16   Velna Ochs, MD  pantoprazole (PROTONIX) 40 MG tablet Take 1 tablet (40 mg total) by mouth daily. 10/06/16   Burns, Arloa Koh, MD  potassium chloride SA (K-DUR,KLOR-CON) 20 MEQ tablet Take 40 mEq by mouth 2 (two) times daily.     [provider]  psyllium (METAMUCIL) 58.6 % packet Take 1 packet by mouth daily as needed (for constipation).    [provider]  sacubitril-valsartan (ENTRESTO) 49-51 MG Take 1 tablet by mouth 2 (two) times daily. 09/14/16   Larey Dresser, MD  simethicone (MYLICON) 80 MG chewable tablet Chew 1 tablet (80 mg total) by mouth every 6 (six) hours as needed for flatulence (gas pains, distended belly). 11/16/16   Alphonzo Grieve, MD  spironolactone (ALDACTONE) 25 MG tablet TAKE 1/2 TABLET BY MOUTH EACH DAY 09/03/16    Clegg, Amy D, NP    Family History Family History  Problem Relation Age of Onset  . Cancer Mother   . Emphysema Mother   . Bronchitis Mother     Social History Social History  Substance Use Topics  . Smoking status: Former Smoker    Packs/day: 1.25    Years: 10.00    Types: Cigarettes    Quit date: 04/08/2007  . Smokeless tobacco: Current User    Types: Snuff  . Alcohol use No     Allergies   Patient has no known allergies.   Review of Systems Review of Systems  All other systems reviewed and are negative.    Physical Exam Updated Vital Signs BP (!) 141/75   Pulse (!) 116   Temp 97.9 F (36.6 C) (Oral)   Resp 20   Ht 6' (1.829 m)   Wt 110.7 kg (244  lb)   SpO2 100%   BMI 33.09 kg/m   Physical Exam  Constitutional: He is oriented to person, place, and time. He appears well-developed and well-nourished.  HENT:  Head: Normocephalic and atraumatic.  Eyes: Conjunctivae and EOM are normal. Pupils are equal, round, and reactive to light. Right eye exhibits no discharge. Left eye exhibits no discharge. No scleral icterus.  Neck: Normal range of motion. Neck supple. No JVD present.  Cardiovascular: Normal rate, regular rhythm and normal heart sounds.  Exam reveals no gallop and no friction rub.   No murmur heard. Pulmonary/Chest: Effort normal and breath sounds normal. No respiratory distress. He has no wheezes. He has no rales. He exhibits no tenderness.  Abdominal: Soft. He exhibits no distension and no mass. There is no tenderness. There is no rebound and no guarding.  Musculoskeletal: Normal range of motion. He exhibits no edema or tenderness.  Right great toe is tender to palpation, but without bony abnormality or deformity, range of motion is limited secondary to pain  Right shoulder no bony abnormality or deformity, normal range of motion and strength   Neurological: He is alert and oriented to person, place, and time.  Skin: Skin is warm and dry.    Psychiatric: He has a normal mood and affect. His behavior is normal. Judgment and thought content normal.  Nursing note and vitals reviewed.    ED Treatments / Results  Labs (all labs ordered are listed, but only abnormal results are displayed) Labs Reviewed  CBC - Abnormal; Notable for the following:       Result Value   Hemoglobin 9.7 (*)    HCT 34.0 (*)    MCV 74.4 (*)    MCH 21.2 (*)    MCHC 28.5 (*)    RDW 18.6 (*)    Platelets 567 (*)    All other components within normal limits  BASIC METABOLIC PANEL    EKG  EKG Interpretation  Date/Time:  Friday Jan 15 2017 19:57:06 EDT Ventricular Rate:  120 PR Interval:  148 QRS Duration: 98 QT Interval:  404 QTC Calculation: 570 R Axis:   65 Text Interpretation:  Sinus tachycardia Minimal voltage criteria for LVH, may be normal variant Prolonged QT Confirmed by Dory Horn) on 01/16/2017 12:08:45 AM       Radiology Dg Shoulder Right  Result Date: 01/15/2017 CLINICAL DATA:  Syncopal episode shoulder pain EXAM: RIGHT SHOULDER - 2+ VIEW COMPARISON:  None. FINDINGS: There is no evidence of fracture or dislocation. There is no evidence of arthropathy or other focal bone abnormality. Soft tissues are unremarkable. IMPRESSION: Negative. Electronically Signed   By: Donavan Foil M.D.   On: 01/15/2017 20:28   Dg Foot Complete Right  Result Date: 01/15/2017 CLINICAL DATA:  Syncopal episode, pain at the great toe EXAM: RIGHT FOOT COMPLETE - 3+ VIEW COMPARISON:  None. FINDINGS: Mild degenerative changes at the first MTP joint. No acute fracture or malalignment. Tiny plantar calcaneal spur. IMPRESSION: No acute osseous abnormality. Electronically Signed   By: Donavan Foil M.D.   On: 01/15/2017 20:29    Procedures Procedures (including critical care time)  Medications Ordered in ED Medications - No data to display   Initial Impression / Assessment and Plan / ED Course  I have reviewed the triage vital signs and the  nursing notes.  Pertinent labs & imaging results that were available during my care of the patient were reviewed by me and considered in my medical decision making (see  chart for details).     Patient is here for right toe pain which he injured 2 days ago after having passed out. He denies any further dizziness. He discussed his syncopal episode with his cardiologist, who has advised him to hold his beta blocker. Denies any chest pain or shortness of breath. He states that he remains tachycardic between 112 and 120, and that this is not unusual for him. His laboratory workup is reassuring. Plan for treatment of right toe pain with postop shoe.    Final Clinical Impressions(s) / ED Diagnoses   Final diagnoses:  Pain of toe of right foot  Acute pain of right shoulder    New Prescriptions New Prescriptions   No medications on file     Delaine Lame 01/16/17 0116    Palumbo, April, MD 01/16/17 0302

## 2017-01-22 ENCOUNTER — Encounter (HOSPITAL_COMMUNITY): Payer: Self-pay

## 2017-01-22 ENCOUNTER — Ambulatory Visit (HOSPITAL_COMMUNITY)
Admission: RE | Admit: 2017-01-22 | Discharge: 2017-01-22 | Disposition: A | Discharge status: Home / Self Care | Payer: Medicaid Other | Source: Ambulatory Visit | Attending: Internal Medicine | Admitting: Internal Medicine

## 2017-01-22 VITALS — Wt 229.2 lb

## 2017-01-22 DIAGNOSIS — Z87898 Personal history of other specified conditions: Secondary | ICD-10-CM | POA: Diagnosis not present

## 2017-01-22 DIAGNOSIS — I110 Hypertensive heart disease with heart failure: Secondary | ICD-10-CM | POA: Diagnosis not present

## 2017-01-22 DIAGNOSIS — Z87891 Personal history of nicotine dependence: Secondary | ICD-10-CM | POA: Diagnosis not present

## 2017-01-22 DIAGNOSIS — R0683 Snoring: Secondary | ICD-10-CM | POA: Diagnosis not present

## 2017-01-22 DIAGNOSIS — I5042 Chronic combined systolic (congestive) and diastolic (congestive) heart failure: Secondary | ICD-10-CM | POA: Diagnosis present

## 2017-01-22 DIAGNOSIS — K219 Gastro-esophageal reflux disease without esophagitis: Secondary | ICD-10-CM | POA: Insufficient documentation

## 2017-01-22 DIAGNOSIS — G8929 Other chronic pain: Secondary | ICD-10-CM | POA: Diagnosis not present

## 2017-01-22 DIAGNOSIS — Z8572 Personal history of non-Hodgkin lymphomas: Secondary | ICD-10-CM | POA: Insufficient documentation

## 2017-01-22 DIAGNOSIS — R42 Dizziness and giddiness: Secondary | ICD-10-CM | POA: Diagnosis not present

## 2017-01-22 DIAGNOSIS — E079 Disorder of thyroid, unspecified: Secondary | ICD-10-CM | POA: Insufficient documentation

## 2017-01-22 DIAGNOSIS — I10 Essential (primary) hypertension: Secondary | ICD-10-CM | POA: Diagnosis not present

## 2017-01-22 DIAGNOSIS — I4581 Long QT syndrome: Secondary | ICD-10-CM | POA: Diagnosis not present

## 2017-01-22 DIAGNOSIS — J449 Chronic obstructive pulmonary disease, unspecified: Secondary | ICD-10-CM | POA: Diagnosis not present

## 2017-01-22 DIAGNOSIS — R000 Tachycardia, unspecified: Secondary | ICD-10-CM | POA: Insufficient documentation

## 2017-01-22 DIAGNOSIS — Z79899 Other long term (current) drug therapy: Secondary | ICD-10-CM | POA: Diagnosis not present

## 2017-01-22 DIAGNOSIS — F1911 Other psychoactive substance abuse, in remission: Secondary | ICD-10-CM

## 2017-01-22 LAB — BASIC METABOLIC PANEL
ANION GAP: 7 (ref 5–15)
BUN: 11 mg/dL (ref 6–20)
CHLORIDE: 104 mmol/L (ref 101–111)
CO2: 27 mmol/L (ref 22–32)
Calcium: 9 mg/dL (ref 8.9–10.3)
Creatinine, Ser: 1.07 mg/dL (ref 0.61–1.24)
GFR calc Af Amer: >60 mL/min (ref >60)
GFR calc non Af Amer: >60 mL/min (ref >60)
GLUCOSE: 104 mg/dL — AB (ref 65–99)
POTASSIUM: 4 mmol/L (ref 3.5–5.1)
Sodium: 138 mmol/L (ref 135–145)

## 2017-01-22 LAB — BRAIN NATRIURETIC PEPTIDE: B Natriuretic Peptide: 644.7 pg/mL — ABNORMAL HIGH (ref 0.0–100.0)

## 2017-01-22 LAB — CBC
HCT: 31.6 % — ABNORMAL LOW (ref 39.0–52.0)
HEMOGLOBIN: 9.1 g/dL — AB (ref 13.0–17.0)
MCH: 21.5 pg — AB (ref 26.0–34.0)
MCHC: 28.8 g/dL — ABNORMAL LOW (ref 30.0–36.0)
MCV: 74.5 fL — AB (ref 78.0–100.0)
Platelets: 367 10*3/uL (ref 150–400)
RBC: 4.24 MIL/uL (ref 4.22–5.81)
RDW: 18.4 % — ABNORMAL HIGH (ref 11.5–15.5)
WBC: 6.4 10*3/uL (ref 4.0–10.5)

## 2017-01-22 MED ORDER — SACUBITRIL-VALSARTAN 24-26 MG PO TABS: 1.0000 | ORAL_TABLET | Freq: Two times a day (BID) | ORAL | 6 refills | Status: DC

## 2017-01-22 MED ORDER — FUROSEMIDE 40 MG PO TABS: 40.0000 mg | ORAL_TABLET | Freq: Every day | ORAL | 6 refills | Status: DC

## 2017-01-22 MED ORDER — CARVEDILOL 6.25 MG PO TABS: 6.2500 mg | ORAL_TABLET | Freq: Two times a day (BID) | ORAL | 6 refills | Status: DC

## 2017-01-22 NOTE — Progress Notes (Signed)
Advanced Heart Failure Clinic Note   Primary Care: None per patient Primary Cardiologist: Dr. Aundra Dubin   HPI:  Leonard [name] is a 41 y.o. male  with history of non-Hodgkin's lymphoma s/p Chemoradiation at [facility] - currently in remission, HTN, Asthma, GERD, and chronic pain.    He presented to [facility] 04/05/16 with worsening SOB and peripheral edema x 2 months, worse over the past two weeks. Also c/o of abdominal distention and orthopnea.  Pertinent labs on admission include K  4.1, Creatinine 1.05, BNP 722.6, Negative troponin. CXR showed vascular congestion and bibasilar airspace opacities.   Echo 04/07/16 LVEF 25-30%, Grade 2 DD, Mild AR, Mild MR, PA peak pressure 47 mm Hg  He presents today for add on for lightheadedness with standing. Seen in ED 01/13/17 for same. Pt admitted to cocaine use. Still having dizzy spells since holding medications since Tuesday. Does have near syncope at times facial paraesthesias and can't breath very well. Happens when he coughs hard or stands up fast. Denies palpitations or tachypalpitations. Doing cocaine at least twice a week.  States he has been having this since before relapsing into cocaine. Has been coughing as well, with a thick mucous. Cough is much worse at night with mildly orthopnea. Occasional chills at night.   Social History: Currently in between jobs  Lives in [place] with Wife, Mother in [place], Daughter, and step-Daughter.  Has another baby on the way.  (Will be his 9th kid overall). Recently started job in a warehouse.  Will get benefits in 90 days from start.    Family History: No significant cardiovascular history.  Mom passed from Lung CA.   Past Medical History  [facility] 04/15/16  Dominance: Right  Left Main  Short, no significant CAD.  Left Anterior Descending  20-30% proximal LAD stenosis prior to small D1.  Left Circumflex  No significant CAD.  Right Coronary Artery  No significant CAD.   Hemodynamics (mmHg) RA mean 13 RV  52/17 PA 55/23, mean 38 PCWP mean 26 Oxygen saturations: PA 55% AO 98% Cardiac Output (Fick) 4.55  Cardiac Index (Fick) 1.98 PVR 2.6 WU  Past Medical History:  Diagnosis Date  . AKI (acute kidney injury) ([place]) 11/09/2016  . Asthma   . Blood transfusion without reported diagnosis   . CHF (congestive heart failure) ([place]) 04/05/2016  . COPD (chronic obstructive pulmonary disease) ([place])   . Hypertension   . Lymphoma ([place])    x 2  . Sleep apnea   . Thyroid disease    Current Outpatient Prescriptions on File Prior to Encounter  Medication Sig Dispense Refill  . acetaminophen (TYLENOL) 500 MG tablet Take 1 tablet (500 mg total) by mouth every 6 (six) hours as needed. (Patient not taking: Reported on 10/21/2016) 30 tablet 0  . albuterol (PROVENTIL HFA;VENTOLIN HFA) 108 (90 Base) MCG/ACT inhaler Inhale 2 puffs into the lungs every 6 (six) hours as needed for wheezing or shortness of breath. (Patient not taking: Reported on 01/22/2017) 1 Inhaler 3  . albuterol (PROVENTIL HFA;VENTOLIN HFA) 108 (90 Base) MCG/ACT inhaler Inhale 1-2 puffs into the lungs every 4 (four) hours as needed for wheezing or shortness of breath. (Patient not taking: Reported on 01/22/2017) 1 Inhaler 0  . albuterol (PROVENTIL) (2.5 MG/3ML) 0.083% nebulizer solution Take 3 mLs (2.5 mg total) by nebulization every 6 (six) hours as needed for wheezing or shortness of breath. (Patient not taking: Reported on 01/22/2017) 75 mL 12  . benzonatate (TESSALON) 100 MG capsule Take 1 capsule (100  mg total) by mouth every 8 (eight) hours. (Patient not taking: Reported on 01/22/2017) 21 capsule 0  . carvedilol (COREG) 12.5 MG tablet Take 1 tablet (12.5 mg total) by mouth 2 (two) times daily with a meal. (Patient not taking: Reported on 01/22/2017) 60 tablet 3  . fluticasone (FLONASE) 50 MCG/ACT nasal spray Place 2 sprays into both nostrils daily. (Patient not taking: Reported on 01/22/2017) 16 g 2  . Fluticasone-Salmeterol (ADVAIR) 250-50 MCG/DOSE AEPB  Inhale 2 puffs into the lungs 2 (two) times daily. (Patient not taking: Reported on 01/22/2017) 60 each 3  . furosemide (LASIX) 40 MG tablet Take 1 tablet (40 mg total) by mouth 2 (two) times daily. (Patient not taking: Reported on 01/22/2017) 60 tablet 5  . gabapentin (NEURONTIN) 800 MG tablet Take 1 tablet (800 mg total) by mouth 3 (three) times daily. (Patient not taking: Reported on 01/22/2017) 90 tablet 2  . guaiFENesin-dextromethorphan (ROBITUSSIN DM) 100-10 MG/5ML syrup Take 5 mLs by mouth every 4 (four) hours as needed for cough. (Patient not taking: Reported on 11/04/2016) 118 mL 0  . Multiple Vitamins-Minerals (MULTIVITAMIN WITH MINERALS) tablet Take 1 tablet by mouth daily.    . Oxycodone HCl 10 MG TABS Take 1 tablet (10 mg total) by mouth every 6 (six) hours as needed. (Patient not taking: Reported on 01/22/2017) 20 tablet 0  . pantoprazole (PROTONIX) 40 MG tablet Take 1 tablet (40 mg total) by mouth daily. (Patient not taking: Reported on 01/22/2017) 30 tablet 11  . potassium chloride SA (K-DUR,KLOR-CON) 20 MEQ tablet Take 40 mEq by mouth 2 (two) times daily.     . psyllium (METAMUCIL) 58.6 % packet Take 1 packet by mouth daily as needed (for constipation).    . sacubitril-valsartan (ENTRESTO) 49-51 MG Take 1 tablet by mouth 2 (two) times daily. (Patient not taking: Reported on 01/22/2017) 60 tablet 5  . simethicone (MYLICON) 80 MG chewable tablet Chew 1 tablet (80 mg total) by mouth every 6 (six) hours as needed for flatulence (gas pains, distended belly). (Patient not taking: Reported on 01/22/2017) 30 tablet 2  . spironolactone (ALDACTONE) 25 MG tablet TAKE 1/2 TABLET BY MOUTH EACH DAY (Patient not taking: Reported on 01/22/2017) 17 tablet 2   No current facility-administered medications on file prior to encounter.    NKDA  Social History   Social History  . Marital status: Single    Spouse name: N/A  . Number of children: N/A  . Years of education: N/A   Occupational History  . Not on file.    Social History Main Topics  . Smoking status: Former Smoker    Packs/day: 1.25    Years: 10.00    Types: Cigarettes    Quit date: 04/08/2007  . Smokeless tobacco: Current User    Types: Snuff  . Alcohol use No  . Drug use: Yes    Types: Cocaine  . Sexual activity: Yes   Other Topics Concern  . Not on file   Social History Narrative  . No narrative on file   Family History  Problem Relation Age of Onset  . Cancer Mother   . Emphysema Mother   . Bronchitis Mother    Vitals:   01/22/17 1150 01/22/17 1153 01/22/17 1154  SpO2: 99% 99% 99%  Weight: 229 lb 4 oz (104 kg)     BP 162/108 Supine BP 161/109 Sitting BP 165/114 Standing  Wt Readings from Last 3 Encounters:  01/22/17 229 lb 4 oz (104 kg)  01/15/17 244  lb (110.7 kg)  01/13/17 244 lb (110.7 kg)   PHYSICAL EXAM: General: Fatigued appearing. NAD.  HEENT: Normal Neck: supple. JVP 6-7 cm. Carotids 2+ bilat; no bruits. No thyromegaly or nodule noted. Cor: PMI nondisplaced. RRR, No M/G/R noted Lungs: CTAB, normal effort. Abdomen: soft, non-tender, distended, no HSM. No bruits or masses. +BS  Extremities: no cyanosis, clubbing, or rash. Trace ankle edema.  Neuro: alert & orientedx3, cranial nerves grossly intact. moves all 4 extremities w/o difficulty. Affect pleasant   EKG Personally reviewed, Sinus tachycardia 102 bpm, moderate criteria for LVH.   ASSESSMENT & PLAN:  1. Chronic combined CHF - Diagnosed 03/2016- Echo 04/07/16 LVEF 25-30%, Grade 2 DD, Peak PA pressure 47 mm Hg.  - Echo 10/19/16 LVEF 40-45%, Trivial AI - RHC 04/15/16 with marginal cardiac output and RA 13.  - Volume status stable to dry, but not overloaded.  - looks relatively stable. Continue Lasix 40 mg po BID with KCL 40 meq. BMET/BNP - Decrease coreg to 6.25 mg BID for tolerability. Explained risks of concurrent cocaine use with BBs.    - Decrease Entresto to 24/26 mg BID for tolerability.   - Negative Hepatitis and HIV panels. Negative ANA  and RF.  Normal UPEP, No M spike on SPEP.  - Reinforced fluid restriction to < 2 L daily, sodium restriction to less than 2000 mg daily, and the importance of daily weights.   2. HTN - Meds as above. Has not been taking medications since Tuesday with lightheadedness. Now mostly resolved.   3. H/o Non-Hodgkins Lymphoma - h/o of rx with adriamycin. Likely contributing factor to his cardiomyopathy. No change.   4. Snoring - Snores badly with + apneic episodes.  - Needs to reschedule sleep study.  5. Lightheadedness - Somewhat improved with holding medicines.  Tachycardiac - Strongly suspect this is related to cocaine use and tachycardia, but also sounds vaso-vagal at time.  - Will add back Entresto and cut back lasix.  Suspect non-compliance also an issue. Has previously stated he does not stay compliant with medicines when he relapses.   Labs today. Meds as above. Repeat labs in 2 weeks.   Shirley Friar, PA-C  01/22/17   Greater than 50% of the 25 minute visit was spent in counseling/coordination of care regarding disease state education, substance abuse counseling, medication reconciliation, importance of medication compliance, and salt/fluid restriction.

## 2017-01-22 NOTE — Patient Instructions (Addendum)
Routine lab work today. Will notify you of abnormal results, otherwise no news is good news!  RESTART Entresto at lower dose of 24/46 mg twice daily. Do NOT half your current tablets. New Rx has been sent to your pharmacy for correct tabs.  RESTART Lasix at reduced dose of 40 mg once daily.  RESTART Coreg at reduced dose of 6.25 mg twice daily. You may half your current 12.5 mg tablets at home (Take 1/2 tab twice daily). New Rx has been sent to your pharmacy for 6.25 mg tablets (Take 1 tab twice daily).  Follow up 2 weeks for labs.  Follow up 4 weeks for appointment with Oda Kilts PA-C.  Do the following things EVERYDAY: 1) Weigh yourself in the morning before breakfast. Write it down and keep it in a log. 2) Take your medicines as prescribed 3) Eat low salt foods-Limit salt (sodium) to 2000 mg per day.  4) Stay as active as you can everyday 5) Limit all fluids for the day to less than 2 liters

## 2017-02-05 ENCOUNTER — Encounter: Payer: Self-pay | Admitting: Interventional Radiology

## 2017-02-05 ENCOUNTER — Other Ambulatory Visit (HOSPITAL_COMMUNITY): Payer: Medicaid Other

## 2017-02-08 ENCOUNTER — Encounter: Payer: Self-pay | Admitting: Radiology

## 2017-02-17 ENCOUNTER — Encounter (HOSPITAL_COMMUNITY): Payer: Medicaid Other

## 2017-03-09 ENCOUNTER — Other Ambulatory Visit (HOSPITAL_COMMUNITY): Payer: Self-pay | Admitting: *Deleted

## 2017-03-10 ENCOUNTER — Telehealth (HOSPITAL_COMMUNITY): Payer: Self-pay | Admitting: *Deleted

## 2017-03-10 ENCOUNTER — Encounter (HOSPITAL_COMMUNITY): Payer: Self-pay

## 2017-03-10 DIAGNOSIS — I5042 Chronic combined systolic (congestive) and diastolic (congestive) heart failure: Secondary | ICD-10-CM

## 2017-03-10 MED ORDER — FUROSEMIDE 40 MG PO TABS: 40.0000 mg | ORAL_TABLET | Freq: Every day | ORAL | 0 refills | Status: DC

## 2017-03-10 NOTE — Telephone Encounter (Signed)
Called pt regarding email he sent via mychart, advised will send Furosemide to [facility] for cash pay, 2 week supply advised pt to take 2 tabs tonight when he picks it up, if any further issues or he is not feeling better tomorrow advised him to call us back, he is agreeable

## 2017-03-31 ENCOUNTER — Inpatient Hospital Stay (HOSPITAL_COMMUNITY): Admission: RE | Admit: 2017-03-31 | Payer: Medicaid Other | Source: Ambulatory Visit

## 2017-04-05 ENCOUNTER — Encounter (HOSPITAL_COMMUNITY): Payer: Medicaid Other

## 2017-04-28 ENCOUNTER — Encounter: Payer: Self-pay | Admitting: Internal Medicine

## 2017-07-20 ENCOUNTER — Emergency Department (HOSPITAL_COMMUNITY)
Admission: EM | Admit: 2017-07-20 | Discharge: 2017-07-20 | Disposition: A | Discharge status: Home / Self Care | Payer: Medicaid Other | Attending: Emergency Medicine | Admitting: Emergency Medicine

## 2017-07-20 ENCOUNTER — Other Ambulatory Visit: Payer: Self-pay

## 2017-07-20 ENCOUNTER — Encounter (HOSPITAL_COMMUNITY): Payer: Self-pay

## 2017-07-20 DIAGNOSIS — R369 Urethral discharge, unspecified: Secondary | ICD-10-CM | POA: Diagnosis present

## 2017-07-20 DIAGNOSIS — I10 Essential (primary) hypertension: Secondary | ICD-10-CM

## 2017-07-20 DIAGNOSIS — Z79899 Other long term (current) drug therapy: Secondary | ICD-10-CM | POA: Diagnosis not present

## 2017-07-20 DIAGNOSIS — F17210 Nicotine dependence, cigarettes, uncomplicated: Secondary | ICD-10-CM | POA: Insufficient documentation

## 2017-07-20 DIAGNOSIS — N390 Urinary tract infection, site not specified: Secondary | ICD-10-CM | POA: Diagnosis not present

## 2017-07-20 DIAGNOSIS — J449 Chronic obstructive pulmonary disease, unspecified: Secondary | ICD-10-CM | POA: Diagnosis not present

## 2017-07-20 DIAGNOSIS — I5042 Chronic combined systolic (congestive) and diastolic (congestive) heart failure: Secondary | ICD-10-CM | POA: Insufficient documentation

## 2017-07-20 DIAGNOSIS — N342 Other urethritis: Secondary | ICD-10-CM

## 2017-07-20 DIAGNOSIS — I110 Hypertensive heart disease with heart failure: Secondary | ICD-10-CM | POA: Insufficient documentation

## 2017-07-20 LAB — URINALYSIS, ROUTINE W REFLEX MICROSCOPIC
BACTERIA UA: NONE SEEN
BILIRUBIN URINE: NEGATIVE
GLUCOSE, UA: NEGATIVE mg/dL
Ketones, ur: NEGATIVE mg/dL
NITRITE: NEGATIVE
Protein, ur: NEGATIVE mg/dL
SPECIFIC GRAVITY, URINE: 1.005 (ref 1.005–1.030)
Squamous Epithelial / LPF: NONE SEEN
pH: 7 (ref 5.0–8.0)

## 2017-07-20 MED ORDER — CEFTRIAXONE SODIUM 250 MG IJ SOLR
250.0000 mg | Freq: Once | INTRAMUSCULAR | Status: AC
  Administered 2017-07-20: 250 mg via INTRAMUSCULAR
  Filled 2017-07-20: qty 250

## 2017-07-20 MED ORDER — CEPHALEXIN 500 MG PO CAPS: 500.0000 mg | ORAL_CAPSULE | Freq: Four times a day (QID) | ORAL | 0 refills | Status: DC

## 2017-07-20 MED ORDER — AZITHROMYCIN 250 MG PO TABS
1000.0000 mg | ORAL_TABLET | Freq: Once | ORAL | Status: AC
  Administered 2017-07-20: 1000 mg via ORAL
  Filled 2017-07-20: qty 4

## 2017-07-20 MED ORDER — LIDOCAINE HCL (PF) 1 % IJ SOLN
INTRAMUSCULAR | Status: AC
  Administered 2017-07-20: 5 mL
  Filled 2017-07-20: qty 5

## 2017-07-20 NOTE — ED Triage Notes (Signed)
Pt seen recently and treated for STD, reports ongoing yellowish green discharge

## 2017-07-20 NOTE — Discharge Instructions (Signed)
It was our pleasure to provide your ER care today - we hope that you feel better.  Keep area very clean.   No sex until after symptoms have completely resolved;  then use condoms to help decrease the risk of std.   Take keflex (antibiotic) as prescribed.  Your blood pressure is high today - take your blood pressure medication as prescribed, limit salt intake, and follow up with primary care doctor in the coming week for recheck.  Return to ER if worse, new symptoms, fevers, other concern.

## 2017-07-20 NOTE — ED Provider Notes (Signed)
[facility] EMERGENCY DEPARTMENT Provider Note   CSN: [id number] Arrival date & time: 07/20/17  0559     History   Chief Complaint Chief Complaint  Patient presents with  . SEXUALLY TRANSMITTED DISEASE    HPI Leonard [name] is a 41 y.o. male.  Patient c/o urethral discharge for the past 1-2 weeks. Discharge is yellowish, moderate, persistent. States a week ago was given a po antibiotic at an outpatient clinic - symptoms improved, but did not resolve.  +dysuria. No hematuria. No abd pain or nv. No fever or chills.    The history is provided by the patient.    Past Medical History:  Diagnosis Date  . AKI (acute kidney injury) ([place]) 11/09/2016  . Asthma   . Blood transfusion without reported diagnosis   . CHF (congestive heart failure) ([place]) 04/05/2016  . COPD (chronic obstructive pulmonary disease) ([place])   . Hypertension   . Lymphoma ([place])    x 2  . Sleep apnea   . Thyroid disease     Patient Active Problem List   Diagnosis Date Noted  . Lightheaded 01/22/2017  . AKI (acute kidney injury) ([place]) 11/09/2016  . Abdominal wall abscess at site of surgical wound   . Fever   . Cellulitis 10/22/2016  . Postoperative ileus ([place]) 10/21/2016  . Incarcerated hernia 10/18/2016  . Right lower lobe lung mass 10/18/2016  . Incarcerated umbilical hernia   . Asthma 10/06/2016  . History of substance abuse 10/06/2016  . TSH elevation 10/06/2016  . Bronchitis 10/06/2016  . Chronic pain syndrome 10/06/2016  . History of non-Hodgkin's lymphoma 04/30/2016  . Snoring 04/30/2016  . Chronic combined systolic and diastolic congestive heart failure ([place]) 04/05/2016  . Essential hypertension 10/09/2015    Past Surgical History:  Procedure Laterality Date  . CARDIAC CATHETERIZATION N/A 04/15/2016   Procedure: Right/Left Heart Cath and Coronary Angiography;  Surgeon: Larey Dresser, MD;  Location: [facility];  Service: Cardiovascular;  Laterality: N/A;  . IR  RADIOLOGIST EVAL & MGMT  11/19/2016  . IR RADIOLOGIST EVAL & MGMT  11/05/2016  . LAPAROSCOPIC ASSISTED SPIGELIAN HERNIA REPAIR N/A 10/18/2016   Procedure: LAPAROSCOPIC REPAIR OF UMBILICAL HERNIA  WITH MESH AND LYSIS OF ADHESIONS.;  Surgeon: [name], MD;  Location: [facility];  Service: General;  Laterality: N/A;  . tumor biopsy    . WISDOM TOOTH EXTRACTION         Home Medications    Prior to Admission medications   Medication Sig Start Date End Date Taking? Authorizing Provider  acetaminophen (TYLENOL) 500 MG tablet Take 1 tablet (500 mg total) by mouth every 6 (six) hours as needed. Patient not taking: Reported on 10/21/2016 01/01/16   Marella Chimes, PA-C  albuterol (PROVENTIL HFA;VENTOLIN HFA) 108 [phone] Base) MCG/ACT inhaler Inhale 2 puffs into the lungs every 6 (six) hours as needed for wheezing or shortness of breath. Patient not taking: Reported on 01/22/2017 10/06/16   Florinda Marker, MD  albuterol (PROVENTIL HFA;VENTOLIN HFA) 108 (90 Base) MCG/ACT inhaler Inhale 1-2 puffs into the lungs every 4 (four) hours as needed for wheezing or shortness of breath. Patient not taking: Reported on 01/22/2017 01/02/17   Maryan Puls, MD  albuterol (PROVENTIL) (2.5 MG/3ML) 0.083% nebulizer solution Take 3 mLs (2.5 mg total) by nebulization every 6 (six) hours as needed for wheezing or shortness of breath. Patient not taking: Reported on 01/22/2017 10/06/16   Florinda Marker, MD  benzonatate (TESSALON) 100 MG  capsule Take 1 capsule (100 mg total) by mouth every 8 (eight) hours. Patient not taking: Reported on 01/22/2017 01/02/17   Maryan Puls, MD  carvedilol (COREG) 6.25 MG tablet Take 1 tablet (6.25 mg total) by mouth 2 (two) times daily with a meal. 01/22/17   Tillery, Satira Mccallum, PA-C  fluticasone [facility]) 50 MCG/ACT nasal spray Place 2 sprays into both nostrils daily. Patient not taking: Reported on 01/22/2017 10/06/16   Florinda Marker, MD  Fluticasone-Salmeterol (ADVAIR) 250-50 MCG/DOSE  AEPB Inhale 2 puffs into the lungs 2 (two) times daily. Patient not taking: Reported on 01/22/2017 10/06/16   Florinda Marker, MD  furosemide (LASIX) 40 MG tablet Take 1 tablet (40 mg total) by mouth daily. 03/10/17   Bensimhon, Shaune Pascal, MD  gabapentin (NEURONTIN) 800 MG tablet Take 1 tablet (800 mg total) by mouth 3 (three) times daily. Patient not taking: Reported on 01/22/2017 11/09/16   Velna Ochs, MD  guaiFENesin-dextromethorphan [facility] DM) 100-10 MG/5ML syrup Take 5 mLs by mouth every 4 (four) hours as needed for cough. Patient not taking: Reported on 11/04/2016 10/06/16   Florinda Marker, MD  Multiple Vitamins-Minerals (MULTIVITAMIN WITH MINERALS) tablet Take 1 tablet by mouth daily.    [provider]  Oxycodone HCl 10 MG TABS Take 1 tablet (10 mg total) by mouth every 6 (six) hours as needed. Patient not taking: Reported on 01/22/2017 11/09/16   Velna Ochs, MD  pantoprazole (PROTONIX) 40 MG tablet Take 1 tablet (40 mg total) by mouth daily. Patient not taking: Reported on 01/22/2017 10/06/16   Florinda Marker, MD  potassium chloride SA (K-DUR,KLOR-CON) 20 MEQ tablet Take 40 mEq by mouth 2 (two) times daily.     [provider]  psyllium (METAMUCIL) 58.6 % packet Take 1 packet by mouth daily as needed (for constipation).    [provider]  sacubitril-valsartan (ENTRESTO) 24-26 MG Take 1 tablet by mouth 2 (two) times daily. 01/22/17   Shirley Friar, PA-C  simethicone (MYLICON) 80 MG chewable tablet Chew 1 tablet (80 mg total) by mouth every 6 (six) hours as needed for flatulence (gas pains, distended belly). Patient not taking: Reported on 01/22/2017 11/16/16   Alphonzo Grieve, MD  spironolactone (ALDACTONE) 25 MG tablet TAKE 1/2 TABLET BY MOUTH EACH DAY Patient not taking: Reported on 01/22/2017 09/03/16   Conrad Julian, NP    Family History Family History  Problem Relation Age of Onset  . Cancer Mother   . Emphysema Mother   . Bronchitis Mother      Social History Social History   Tobacco Use  . Smoking status: Former Smoker    Packs/day: 1.25    Years: 10.00    Pack years: 12.50    Types: Cigarettes    Last attempt to quit: 04/08/2007    Years since quitting: 10.2  . Smokeless tobacco: Current User    Types: Snuff  Substance Use Topics  . Alcohol use: No    Alcohol/week: 0.0 oz  . Drug use: Yes    Types: Cocaine     Allergies   Patient has no known allergies.   Review of Systems Review of Systems  Constitutional: Negative for fever.  HENT: Negative for sore throat.   Respiratory: Negative for cough.   Gastrointestinal: Negative for abdominal pain and vomiting.  Genitourinary: Positive for discharge.  Skin: Negative for rash.     Physical Exam Updated Vital Signs BP (!) 162/119 (BP Location: Right Arm)   Pulse [facility] Kitchen)  110   Temp 98.1 F (36.7 C) (Oral)   Resp 17   Ht 1.829 m (6')   Wt 104.3 kg (230 lb)   SpO2 97%   BMI 31.19 kg/m   Physical Exam  Constitutional: He appears well-developed and well-nourished. No distress.  HENT:  Head: Atraumatic.  Eyes: Conjunctivae are normal.  Neck: Neck supple. No tracheal deviation present.  Cardiovascular: Normal rate.  Pulmonary/Chest: Effort normal. No accessory muscle usage. No respiratory distress.  Abdominal: He exhibits no distension. There is no tenderness.  Genitourinary:  Genitourinary Comments: +yellowish penile discharge. Mild erythema to head of penis, about meatus.   Musculoskeletal: He exhibits no edema.  Neurological: He is alert.  Skin: Skin is warm and dry. No rash noted. He is not diaphoretic.  Psychiatric: He has a normal mood and affect.  Nursing note and vitals reviewed.    ED Treatments / Results  Labs (all labs ordered are listed, but only abnormal results are displayed) Results for orders placed or performed during the hospital encounter of 07/20/17  Urinalysis, Routine w reflex microscopic  Result Value Ref Range   Color,  Urine STRAW (A) YELLOW   APPearance HAZY (A) CLEAR   Specific Gravity, Urine 1.005 1.005 - 1.030   pH 7.0 5.0 - 8.0   Glucose, UA NEGATIVE NEGATIVE mg/dL   Hgb urine dipstick SMALL (A) NEGATIVE   Bilirubin Urine NEGATIVE NEGATIVE   Ketones, ur NEGATIVE NEGATIVE mg/dL   Protein, ur NEGATIVE NEGATIVE mg/dL   Nitrite NEGATIVE NEGATIVE   Leukocytes, UA LARGE (A) NEGATIVE   RBC / HPF 6-30 0 - 5 RBC/hpf   WBC, UA TOO NUMEROUS TO COUNT 0 - 5 WBC/hpf   Bacteria, UA NONE SEEN NONE SEEN   Squamous Epithelial / LPF NONE SEEN NONE SEEN   Mucus PRESENT     EKG  EKG Interpretation None       Radiology No results found.  Procedures Procedures (including critical care time)  Medications Ordered in ED Medications  cefTRIAXone (ROCEPHIN) injection 250 mg (not administered)  azithromycin (ZITHROMAX) tablet 1,000 mg (not administered)     Initial Impression / Assessment and Plan / ED Course  I have reviewed the triage vital signs and the nursing notes.  Pertinent labs & imaging results that were available during my care of the patient were reviewed by me and considered in my medical decision making (see chart for details).  Rocephin im. zithromax po.  Given erythema about head/tip of penis, persistent dysuria, will also give rx keflex for home.   Final Clinical Impressions(s) / ED Diagnoses   Final diagnoses:  None    ED Discharge Orders    None       Lajean Saver, MD 07/20/17 [phone]

## 2017-07-21 LAB — GC/CHLAMYDIA PROBE AMP (~~LOC~~) NOT AT ARMC
CHLAMYDIA, DNA PROBE: NEGATIVE
Neisseria Gonorrhea: POSITIVE — AB

## 2017-07-27 DIAGNOSIS — R079 Chest pain, unspecified: Secondary | ICD-10-CM | POA: Diagnosis not present

## 2017-07-27 DIAGNOSIS — Z5321 Procedure and treatment not carried out due to patient leaving prior to being seen by health care provider: Secondary | ICD-10-CM | POA: Insufficient documentation

## 2017-07-28 ENCOUNTER — Emergency Department (HOSPITAL_COMMUNITY): Payer: Medicaid Other

## 2017-07-28 ENCOUNTER — Encounter (HOSPITAL_COMMUNITY): Payer: Self-pay | Admitting: Emergency Medicine

## 2017-07-28 ENCOUNTER — Other Ambulatory Visit: Payer: Self-pay

## 2017-07-28 ENCOUNTER — Emergency Department (HOSPITAL_COMMUNITY)
Admission: EM | Admit: 2017-07-28 | Discharge: 2017-07-28 | Disposition: A | Discharge status: Home / Self Care | Payer: Medicaid Other | Attending: Emergency Medicine | Admitting: Emergency Medicine

## 2017-07-28 LAB — BASIC METABOLIC PANEL
Anion gap: 10 (ref 5–15)
BUN: 15 mg/dL (ref 6–20)
CHLORIDE: 102 mmol/L (ref 101–111)
CO2: 25 mmol/L (ref 22–32)
CREATININE: 1.18 mg/dL (ref 0.61–1.24)
Calcium: 8.6 mg/dL — ABNORMAL LOW (ref 8.9–10.3)
GFR calc Af Amer: >60 mL/min (ref >60)
GLUCOSE: 109 mg/dL — AB (ref 65–99)
POTASSIUM: 3.2 mmol/L — AB (ref 3.5–5.1)
SODIUM: 137 mmol/L (ref 135–145)

## 2017-07-28 LAB — BRAIN NATRIURETIC PEPTIDE: B Natriuretic Peptide: 1086.5 pg/mL — ABNORMAL HIGH (ref 0.0–100.0)

## 2017-07-28 LAB — I-STAT TROPONIN, ED: Troponin i, poc: 0 ng/mL (ref 0.00–0.08)

## 2017-07-28 LAB — CBC
HCT: 32.7 % — ABNORMAL LOW (ref 39.0–52.0)
Hemoglobin: 9.3 g/dL — ABNORMAL LOW (ref 13.0–17.0)
MCH: 19.8 pg — ABNORMAL LOW (ref 26.0–34.0)
MCHC: 28.4 g/dL — AB (ref 30.0–36.0)
MCV: 69.7 fL — AB (ref 78.0–100.0)
PLATELETS: 457 10*3/uL — AB (ref 150–400)
RBC: 4.69 MIL/uL (ref 4.22–5.81)
RDW: 20.2 % — AB (ref 11.5–15.5)
WBC: 7.2 10*3/uL (ref 4.0–10.5)

## 2017-07-28 NOTE — ED Triage Notes (Signed)
Pt with multiple c/o including left side 7/10 cp, swollen ankles and SOB for the past few days getting worse today. Elevated BP 156/107.

## 2017-07-28 NOTE — ED Notes (Signed)
Called for in waiting room, no replies

## 2017-08-02 ENCOUNTER — Other Ambulatory Visit: Payer: Self-pay

## 2017-08-02 ENCOUNTER — Encounter (HOSPITAL_COMMUNITY): Payer: Self-pay

## 2017-08-02 ENCOUNTER — Emergency Department (HOSPITAL_COMMUNITY)
Admission: EM | Admit: 2017-08-02 | Discharge: 2017-08-02 | Disposition: A | Discharge status: Home / Self Care | Payer: Medicaid Other | Attending: Emergency Medicine | Admitting: Emergency Medicine

## 2017-08-02 ENCOUNTER — Emergency Department (HOSPITAL_COMMUNITY): Payer: Medicaid Other

## 2017-08-02 DIAGNOSIS — R0600 Dyspnea, unspecified: Secondary | ICD-10-CM | POA: Diagnosis present

## 2017-08-02 DIAGNOSIS — J45909 Unspecified asthma, uncomplicated: Secondary | ICD-10-CM | POA: Insufficient documentation

## 2017-08-02 DIAGNOSIS — I110 Hypertensive heart disease with heart failure: Secondary | ICD-10-CM | POA: Insufficient documentation

## 2017-08-02 DIAGNOSIS — J449 Chronic obstructive pulmonary disease, unspecified: Secondary | ICD-10-CM | POA: Diagnosis not present

## 2017-08-02 DIAGNOSIS — Z79899 Other long term (current) drug therapy: Secondary | ICD-10-CM | POA: Insufficient documentation

## 2017-08-02 DIAGNOSIS — I5042 Chronic combined systolic (congestive) and diastolic (congestive) heart failure: Secondary | ICD-10-CM | POA: Diagnosis not present

## 2017-08-02 DIAGNOSIS — F17290 Nicotine dependence, other tobacco product, uncomplicated: Secondary | ICD-10-CM | POA: Insufficient documentation

## 2017-08-02 DIAGNOSIS — I509 Heart failure, unspecified: Secondary | ICD-10-CM

## 2017-08-02 LAB — I-STAT TROPONIN, ED: Troponin i, poc: 0.01 ng/mL (ref 0.00–0.08)

## 2017-08-02 LAB — CBC
HCT: 35.7 % — ABNORMAL LOW (ref 39.0–52.0)
Hemoglobin: 10.3 g/dL — ABNORMAL LOW (ref 13.0–17.0)
MCH: 20.3 pg — ABNORMAL LOW (ref 26.0–34.0)
MCHC: 28.9 g/dL — ABNORMAL LOW (ref 30.0–36.0)
MCV: 70.3 fL — ABNORMAL LOW (ref 78.0–100.0)
Platelets: 414 10*3/uL — ABNORMAL HIGH (ref 150–400)
RBC: 5.08 MIL/uL (ref 4.22–5.81)
RDW: 20.5 % — ABNORMAL HIGH (ref 11.5–15.5)
WBC: 8 10*3/uL (ref 4.0–10.5)

## 2017-08-02 LAB — I-STAT CG4 LACTIC ACID, ED
Lactic Acid, Venous: 2.81 mmol/L (ref 0.5–1.9)
Lactic Acid, Venous: 2.09 mmol/L (ref 0.5–1.9)

## 2017-08-02 LAB — BASIC METABOLIC PANEL
Anion gap: 8 (ref 5–15)
BUN: 8 mg/dL (ref 6–20)
CO2: 24 mmol/L (ref 22–32)
Calcium: 8.7 mg/dL — ABNORMAL LOW (ref 8.9–10.3)
Chloride: 105 mmol/L (ref 101–111)
Creatinine, Ser: 1.01 mg/dL (ref 0.61–1.24)
GFR calc Af Amer: >60 mL/min (ref >60)
GFR calc non Af Amer: >60 mL/min (ref >60)
Glucose, Bld: 100 mg/dL — ABNORMAL HIGH (ref 65–99)
Potassium: 3.2 mmol/L — ABNORMAL LOW (ref 3.5–5.1)
Sodium: 137 mmol/L (ref 135–145)

## 2017-08-02 LAB — BRAIN NATRIURETIC PEPTIDE: B Natriuretic Peptide: 1111.6 pg/mL — ABNORMAL HIGH (ref 0.0–100.0)

## 2017-08-02 MED ORDER — SPIRONOLACTONE 25 MG PO TABS
25.0000 mg | ORAL_TABLET | Freq: Once | ORAL | Status: DC
  Filled 2017-08-02: qty 1

## 2017-08-02 MED ORDER — SACUBITRIL-VALSARTAN 24-26 MG PO TABS
1.0000 | ORAL_TABLET | Freq: Two times a day (BID) | ORAL | Status: DC
  Administered 2017-08-02: 1 via ORAL
  Filled 2017-08-02: qty 1

## 2017-08-02 MED ORDER — FUROSEMIDE 40 MG PO TABS: 40.0000 mg | ORAL_TABLET | Freq: Two times a day (BID) | ORAL | 0 refills | Status: DC

## 2017-08-02 MED ORDER — CARVEDILOL 6.25 MG PO TABS: 6.2500 mg | ORAL_TABLET | Freq: Two times a day (BID) | ORAL | 0 refills | Status: DC

## 2017-08-02 MED ORDER — SPIRONOLACTONE 12.5 MG HALF TABLET
12.5000 mg | ORAL_TABLET | Freq: Every day | ORAL | Status: DC
  Administered 2017-08-02: 12.5 mg via ORAL
  Filled 2017-08-02: qty 1

## 2017-08-02 MED ORDER — ALBUTEROL SULFATE (2.5 MG/3ML) 0.083% IN NEBU
5.0000 mg | INHALATION_SOLUTION | Freq: Once | RESPIRATORY_TRACT | Status: AC
  Administered 2017-08-02: 5 mg via RESPIRATORY_TRACT
  Filled 2017-08-02: qty 6

## 2017-08-02 MED ORDER — SACUBITRIL-VALSARTAN 49-51 MG PO TABS: 1.0000 | ORAL_TABLET | Freq: Two times a day (BID) | ORAL | 0 refills | Status: DC

## 2017-08-02 MED ORDER — CARVEDILOL 12.5 MG PO TABS: 6.2500 mg | ORAL_TABLET | Freq: Two times a day (BID) | ORAL | Status: DC

## 2017-08-02 NOTE — ED Notes (Addendum)
Dr Wilson Singer discussed admission with pt but pt wants to go home. Pt states "my hr is always in the 120s" pt appears to be in NAD. Dr Wilson Singer notified that Pt stated "I never said I didn't want to be admitted" Dr Wilson Singer in room to speak with pt. Pt again offered admission but pt stated "I can piss at home just as easy as I can here" Pt informed that if he does not improve after increasing lasix dosage then to come back. Pt agreed to this.

## 2017-08-02 NOTE — ED Provider Notes (Signed)
[facility] EMERGENCY DEPARTMENT Provider Note   CSN: [id number] Arrival date & time: 08/02/17  1743     History   Chief Complaint Chief Complaint  Patient presents with  . Shortness of Breath    HPI Leonard [name] is a 40 y.o. male.  HPI   41 year old male with dyspnea and increasing lower extremity edema.  Progressively worsened over the past week or so.  Patient has a past history including CHF, COPD, hypertension, thyroid disease, lymphoma, sleep apnea and asthma.  He has been without his medications for several months aside from his Lasix which he reports compliance with.  His breathing feels relatively okay at rest but worsens with pretty minimal exertion.  Denies orthopnea.  No cough.  Denies any chest pain.  Past Medical History:  Diagnosis Date  . AKI (acute kidney injury) ([place]) 11/09/2016  . Asthma   . Blood transfusion without reported diagnosis   . CHF (congestive heart failure) ([place]) 04/05/2016  . COPD (chronic obstructive pulmonary disease) ([place])   . Hypertension   . Lymphoma ([place])    x 2  . Sleep apnea   . Thyroid disease     Patient Active Problem List   Diagnosis Date Noted  . Lightheaded 01/22/2017  . AKI (acute kidney injury) ([place]) 11/09/2016  . Abdominal wall abscess at site of surgical wound   . Fever   . Cellulitis 10/22/2016  . Postoperative ileus ([place]) 10/21/2016  . Incarcerated hernia 10/18/2016  . Right lower lobe lung mass 10/18/2016  . Incarcerated umbilical hernia   . Asthma 10/06/2016  . History of substance abuse 10/06/2016  . TSH elevation 10/06/2016  . Bronchitis 10/06/2016  . Chronic pain syndrome 10/06/2016  . History of non-Hodgkin's lymphoma 04/30/2016  . Snoring 04/30/2016  . Chronic combined systolic and diastolic congestive heart failure ([place]) 04/05/2016  . Essential hypertension 10/09/2015    Past Surgical History:  Procedure Laterality Date  . CARDIAC CATHETERIZATION N/A 04/15/2016   Procedure:  Right/Left Heart Cath and Coronary Angiography;  Surgeon: Larey Dresser, MD;  Location: [facility];  Service: Cardiovascular;  Laterality: N/A;  . IR RADIOLOGIST EVAL & MGMT  11/19/2016  . IR RADIOLOGIST EVAL & MGMT  11/05/2016  . LAPAROSCOPIC ASSISTED SPIGELIAN HERNIA REPAIR N/A 10/18/2016   Procedure: LAPAROSCOPIC REPAIR OF UMBILICAL HERNIA  WITH MESH AND LYSIS OF ADHESIONS.;  Surgeon: [name], MD;  Location: [facility];  Service: General;  Laterality: N/A;  . tumor biopsy    . WISDOM TOOTH EXTRACTION         Home Medications    Prior to Admission medications   Medication Sig Start Date End Date Taking? Authorizing Provider  acetaminophen (TYLENOL) 500 MG tablet Take 1 tablet (500 mg total) by mouth every 6 (six) hours as needed. Patient not taking: Reported on 10/21/2016 01/01/16   Marella Chimes, PA-C  albuterol (PROVENTIL HFA;VENTOLIN HFA) 108 [phone] Base) MCG/ACT inhaler Inhale 2 puffs into the lungs every 6 (six) hours as needed for wheezing or shortness of breath. Patient not taking: Reported on 01/22/2017 10/06/16   Florinda Marker, MD  albuterol (PROVENTIL HFA;VENTOLIN HFA) 108 (90 Base) MCG/ACT inhaler Inhale 1-2 puffs into the lungs every 4 (four) hours as needed for wheezing or shortness of breath. Patient not taking: Reported on 01/22/2017 01/02/17   Maryan Puls, MD  albuterol (PROVENTIL) (2.5 MG/3ML) 0.083% nebulizer solution Take 3 mLs (2.5 mg total) by nebulization every 6 (six) hours as needed for  wheezing or shortness of breath. Patient not taking: Reported on 01/22/2017 10/06/16   Florinda Marker, MD  benzonatate (TESSALON) 100 MG capsule Take 1 capsule (100 mg total) by mouth every 8 (eight) hours. Patient not taking: Reported on 01/22/2017 01/02/17   Maryan Puls, MD  carvedilol (COREG) 6.25 MG tablet Take 1 tablet (6.25 mg total) by mouth 2 (two) times daily with a meal. 01/22/17   Tillery, Satira Mccallum, PA-C  cephALEXin (KEFLEX) 500 MG capsule Take 1 capsule  (500 mg total) by mouth 4 (four) times daily. 07/20/17   Lajean Saver, MD  fluticasone (FLONASE) 50 MCG/ACT nasal spray Place 2 sprays into both nostrils daily. Patient not taking: Reported on 01/22/2017 10/06/16   Florinda Marker, MD  Fluticasone-Salmeterol (ADVAIR) 250-50 MCG/DOSE AEPB Inhale 2 puffs into the lungs 2 (two) times daily. Patient not taking: Reported on 01/22/2017 10/06/16   Florinda Marker, MD  furosemide (LASIX) 40 MG tablet Take 1 tablet (40 mg total) by mouth daily. 03/10/17   Bensimhon, Shaune Pascal, MD  gabapentin (NEURONTIN) 800 MG tablet Take 1 tablet (800 mg total) by mouth 3 (three) times daily. Patient not taking: Reported on 01/22/2017 11/09/16   Velna Ochs, MD  guaiFENesin-dextromethorphan [facility] DM) 100-10 MG/5ML syrup Take 5 mLs by mouth every 4 (four) hours as needed for cough. Patient not taking: Reported on 11/04/2016 10/06/16   Florinda Marker, MD  Multiple Vitamins-Minerals (MULTIVITAMIN WITH MINERALS) tablet Take 1 tablet by mouth daily.    [provider]  Oxycodone HCl 10 MG TABS Take 1 tablet (10 mg total) by mouth every 6 (six) hours as needed. Patient not taking: Reported on 01/22/2017 11/09/16   Velna Ochs, MD  pantoprazole (PROTONIX) 40 MG tablet Take 1 tablet (40 mg total) by mouth daily. Patient not taking: Reported on 01/22/2017 10/06/16   Florinda Marker, MD  potassium chloride SA (K-DUR,KLOR-CON) 20 MEQ tablet Take 40 mEq by mouth 2 (two) times daily.     [provider]  psyllium (METAMUCIL) 58.6 % packet Take 1 packet by mouth daily as needed (for constipation).    [provider]  sacubitril-valsartan (ENTRESTO) 24-26 MG Take 1 tablet by mouth 2 (two) times daily. 01/22/17   Shirley Friar, PA-C  simethicone (MYLICON) 80 MG chewable tablet Chew 1 tablet (80 mg total) by mouth every 6 (six) hours as needed for flatulence (gas pains, distended belly). Patient not taking: Reported on 01/22/2017 11/16/16   Alphonzo Grieve, MD   spironolactone (ALDACTONE) 25 MG tablet TAKE 1/2 TABLET BY MOUTH EACH DAY Patient not taking: Reported on 01/22/2017 09/03/16   Conrad Matlacha, NP    Family History Family History  Problem Relation Age of Onset  . Cancer Mother   . Emphysema Mother   . Bronchitis Mother     Social History Social History   Tobacco Use  . Smoking status: Former Smoker    Packs/day: 1.25    Years: 10.00    Pack years: 12.50    Types: Cigarettes    Last attempt to quit: 04/08/2007    Years since quitting: 10.3  . Smokeless tobacco: Current User    Types: Snuff  Substance Use Topics  . Alcohol use: No    Alcohol/week: 0.0 oz  . Drug use: Yes    Types: Cocaine     Allergies   Patient has no known allergies.   Review of Systems Review of Systems All systems reviewed and negative, other than as noted  in HPI.   Physical Exam Updated Vital Signs BP (!) 153/105   Pulse (!) 120   Temp 98 F (36.7 C) (Oral)   Resp (!) 21   Ht 6' (1.829 m)   Wt 106.6 kg (235 lb)   SpO2 100%   BMI 31.87 kg/m   Physical Exam  Constitutional: He appears well-developed and well-nourished. No distress.  HENT:  Head: Normocephalic and atraumatic.  Eyes: Conjunctivae are normal. Right eye exhibits no discharge. Left eye exhibits no discharge.  Neck: Neck supple.  Cardiovascular: Regular rhythm and normal heart sounds. Exam reveals no gallop and no friction rub.  No murmur heard. tachycardic  Pulmonary/Chest: Effort normal and breath sounds normal. No respiratory distress.  Abdominal: Soft. He exhibits no distension. There is no tenderness.  Musculoskeletal: He exhibits edema. He exhibits no tenderness.  Severe symmetric pitting LE edema  Neurological: He is alert.  Skin: Skin is warm and dry.  Psychiatric: He has a normal mood and affect. His behavior is normal. Thought content normal.  Nursing note and vitals reviewed.    ED Treatments / Results  Labs (all labs ordered are listed, but only  abnormal results are displayed) Labs Reviewed  BASIC METABOLIC PANEL - Abnormal; Notable for the following components:      Result Value   Potassium 3.2 (*)    Glucose, Bld 100 (*)    Calcium 8.7 (*)    All other components within normal limits  CBC - Abnormal; Notable for the following components:   Hemoglobin 10.3 (*)    HCT 35.7 (*)    MCV 70.3 (*)    MCH 20.3 (*)    MCHC 28.9 (*)    RDW 20.5 (*)    Platelets 414 (*)    All other components within normal limits  BRAIN NATRIURETIC PEPTIDE - Abnormal; Notable for the following components:   B Natriuretic Peptide 1,111.6 (*)    All other components within normal limits  I-STAT CG4 LACTIC ACID, ED - Abnormal; Notable for the following components:   Lactic Acid, Venous 2.81 (*)    All other components within normal limits  I-STAT TROPONIN, ED  I-STAT CG4 LACTIC ACID, ED    EKG  EKG Interpretation None       Radiology Dg Chest 2 View  Result Date: 08/02/2017 CLINICAL DATA:  Worsening dyspnea and lower extremity swelling for 1 week. EXAM: CHEST  2 VIEW COMPARISON:  07/28/17 FINDINGS: Chronic left upper lobe collapse, unchanged. Mild pleural thickening versus small effusion on the right. No airspace consolidation. Normal pulmonary vasculature. Hilar, mediastinal and cardiac contours are unchanged over the past several examinations dating back to at least 10/08/2015. IMPRESSION: 1. Chronic left upper lobe collapse, stable. 2. No airspace consolidation.  Normal vasculature. 3. Possible small right effusion versus pleural thickening. Electronically Signed   By: Andreas Newport M.D.   On: 08/02/2017 18:39    Procedures Procedures (including critical care time)  Medications Ordered in ED Medications  carvedilol (COREG) tablet 6.25 mg (not administered)  sacubitril-valsartan (ENTRESTO) 24-26 mg per tablet (1 tablet Oral Given 08/02/17 2029)  spironolactone (ALDACTONE) tablet 12.5 mg (12.5 mg Oral Given 08/02/17 2029)    albuterol (PROVENTIL) (2.5 MG/3ML) 0.083% nebulizer solution 5 mg (5 mg Nebulization Given 08/02/17 1756)     Initial Impression / Assessment and Plan / ED Course  I have reviewed the triage vital signs and the nursing notes.  Pertinent labs & imaging results that were available during my care of  the patient were reviewed by me and considered in my medical decision making (see chart for details).     41yM with volume overload. Tachycardic but reports chronic for him. Hypertensive. Severe LE edema. o2 sats normal on RA and can carry on a conversation with apparent ease.  Noncompliant with meds. Also diet very poor because of financial problems. Eats Ramen several times a day, crackers and 42oz cups of soda that he can refill for $0.27 at a gas station. He is actually quit knowledgable in terms of assistance he can get including his meds. I think admission would be more prudent. He declines. Prescriptions provided. Advised FU with cards. Return precautions discused.   Final Clinical Impressions(s) / ED Diagnoses   Final diagnoses:  Congestive heart failure, unspecified HF chronicity, unspecified heart failure type [facility])    ED Discharge Orders        Ordered    furosemide (LASIX) 40 MG tablet  2 times daily     08/02/17 2107    sacubitril-valsartan (ENTRESTO) 49-51 MG  2 times daily     08/02/17 2107    carvedilol (COREG) 6.25 MG tablet  2 times daily with meals     08/02/17 2107       Virgel Manifold, MD 08/10/17 1019

## 2017-08-02 NOTE — ED Triage Notes (Signed)
Per Pt, Pt is coming from home with complaints of increased SOB and swollen ankles x 1 week. Pt has hx of fluid overload and takes Lasix. Reports taking medication as prescribed with no relief. Expiratory wheeze noted in lungs upon arrival. Denies Chest pain at this time, but reports some in the past week along with a couple episodes of vomiting.

## 2017-08-28 IMAGING — CR DG CHEST 2V
2 series · 2 of 2 positions shown · non-contrast
Comparison: None.

CLINICAL DATA: Body ache, shortness of breath, fever and cough for
1 week. History of lymphoma, asthma and hypertension.

EXAM:
CHEST  2 VIEW

[w chest pa]
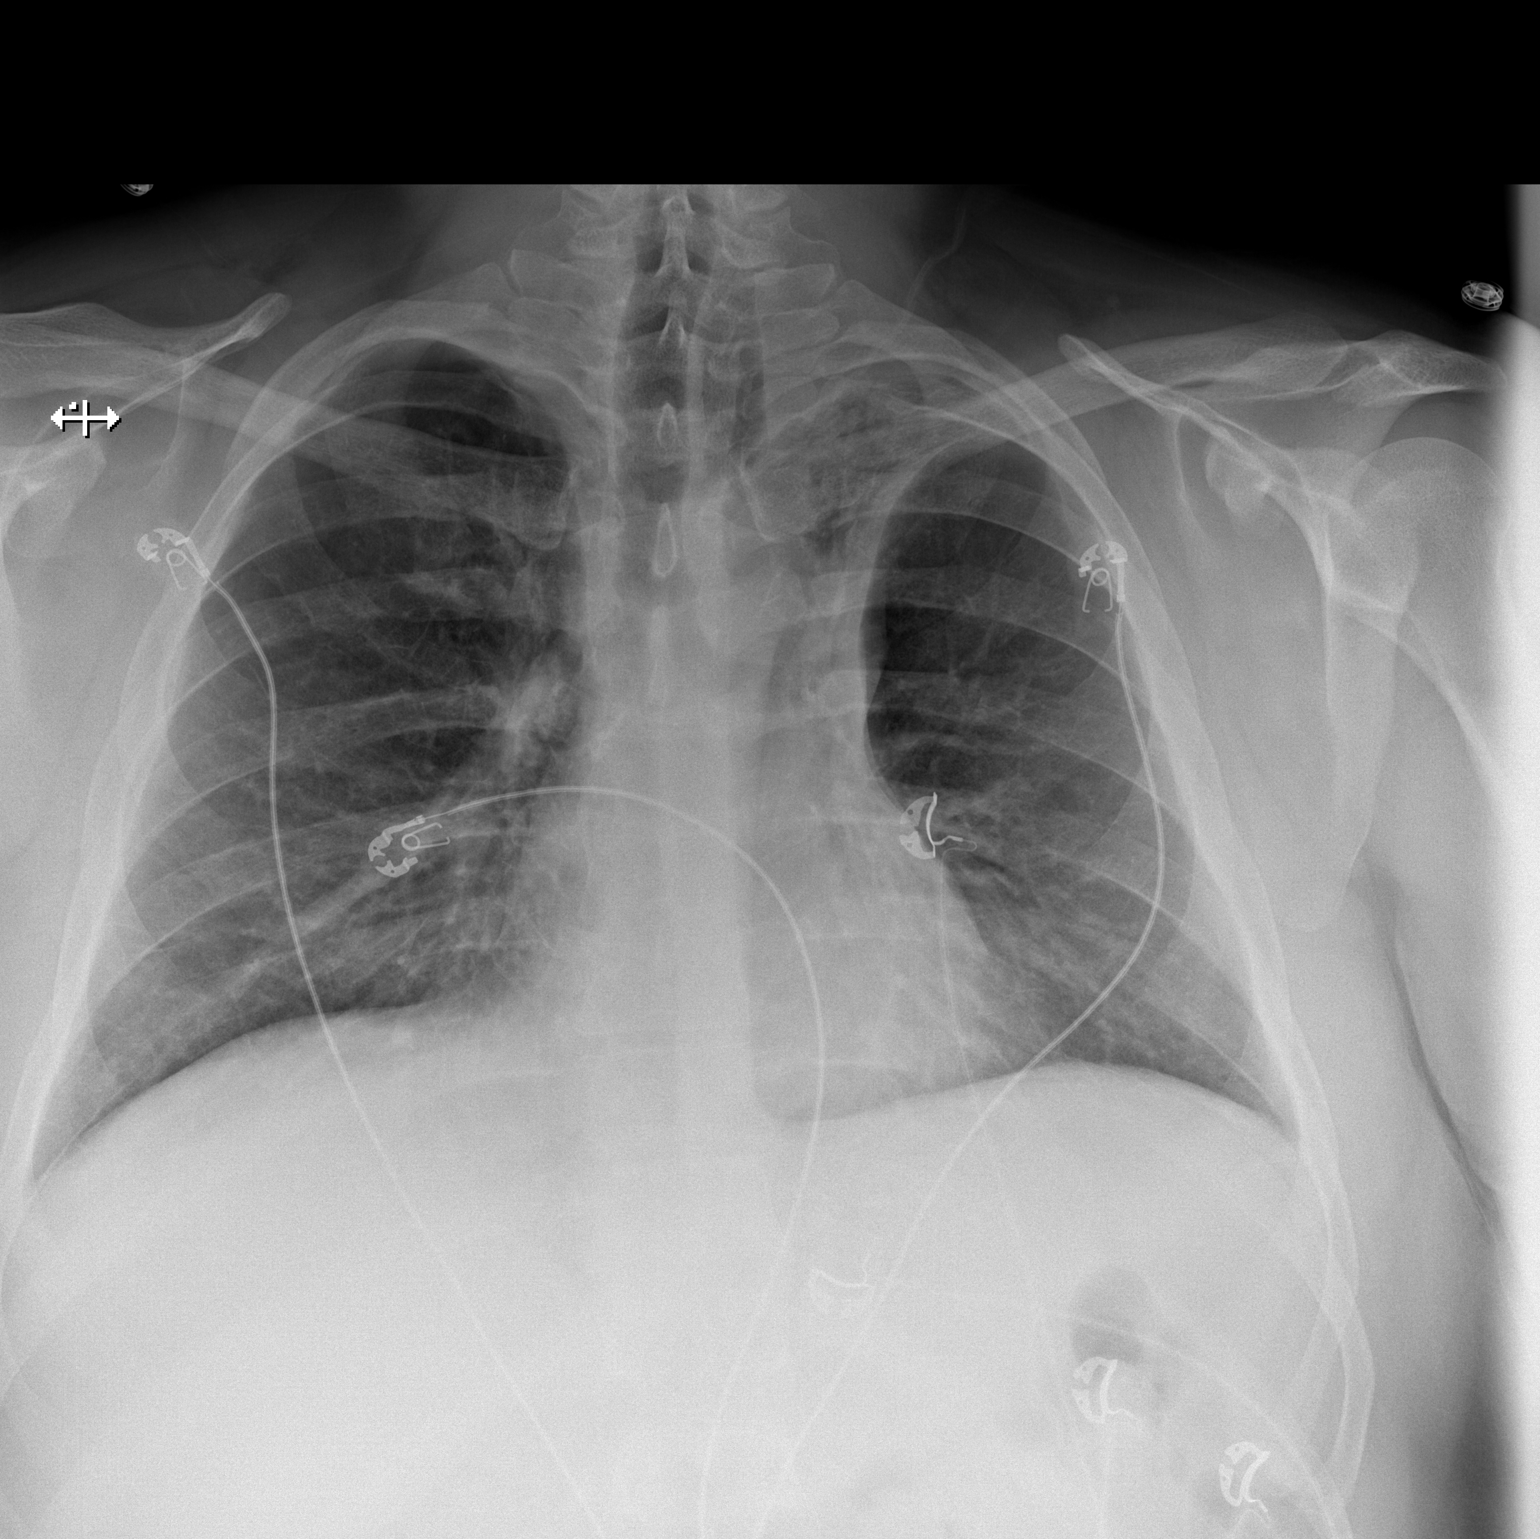

[w chest lat]
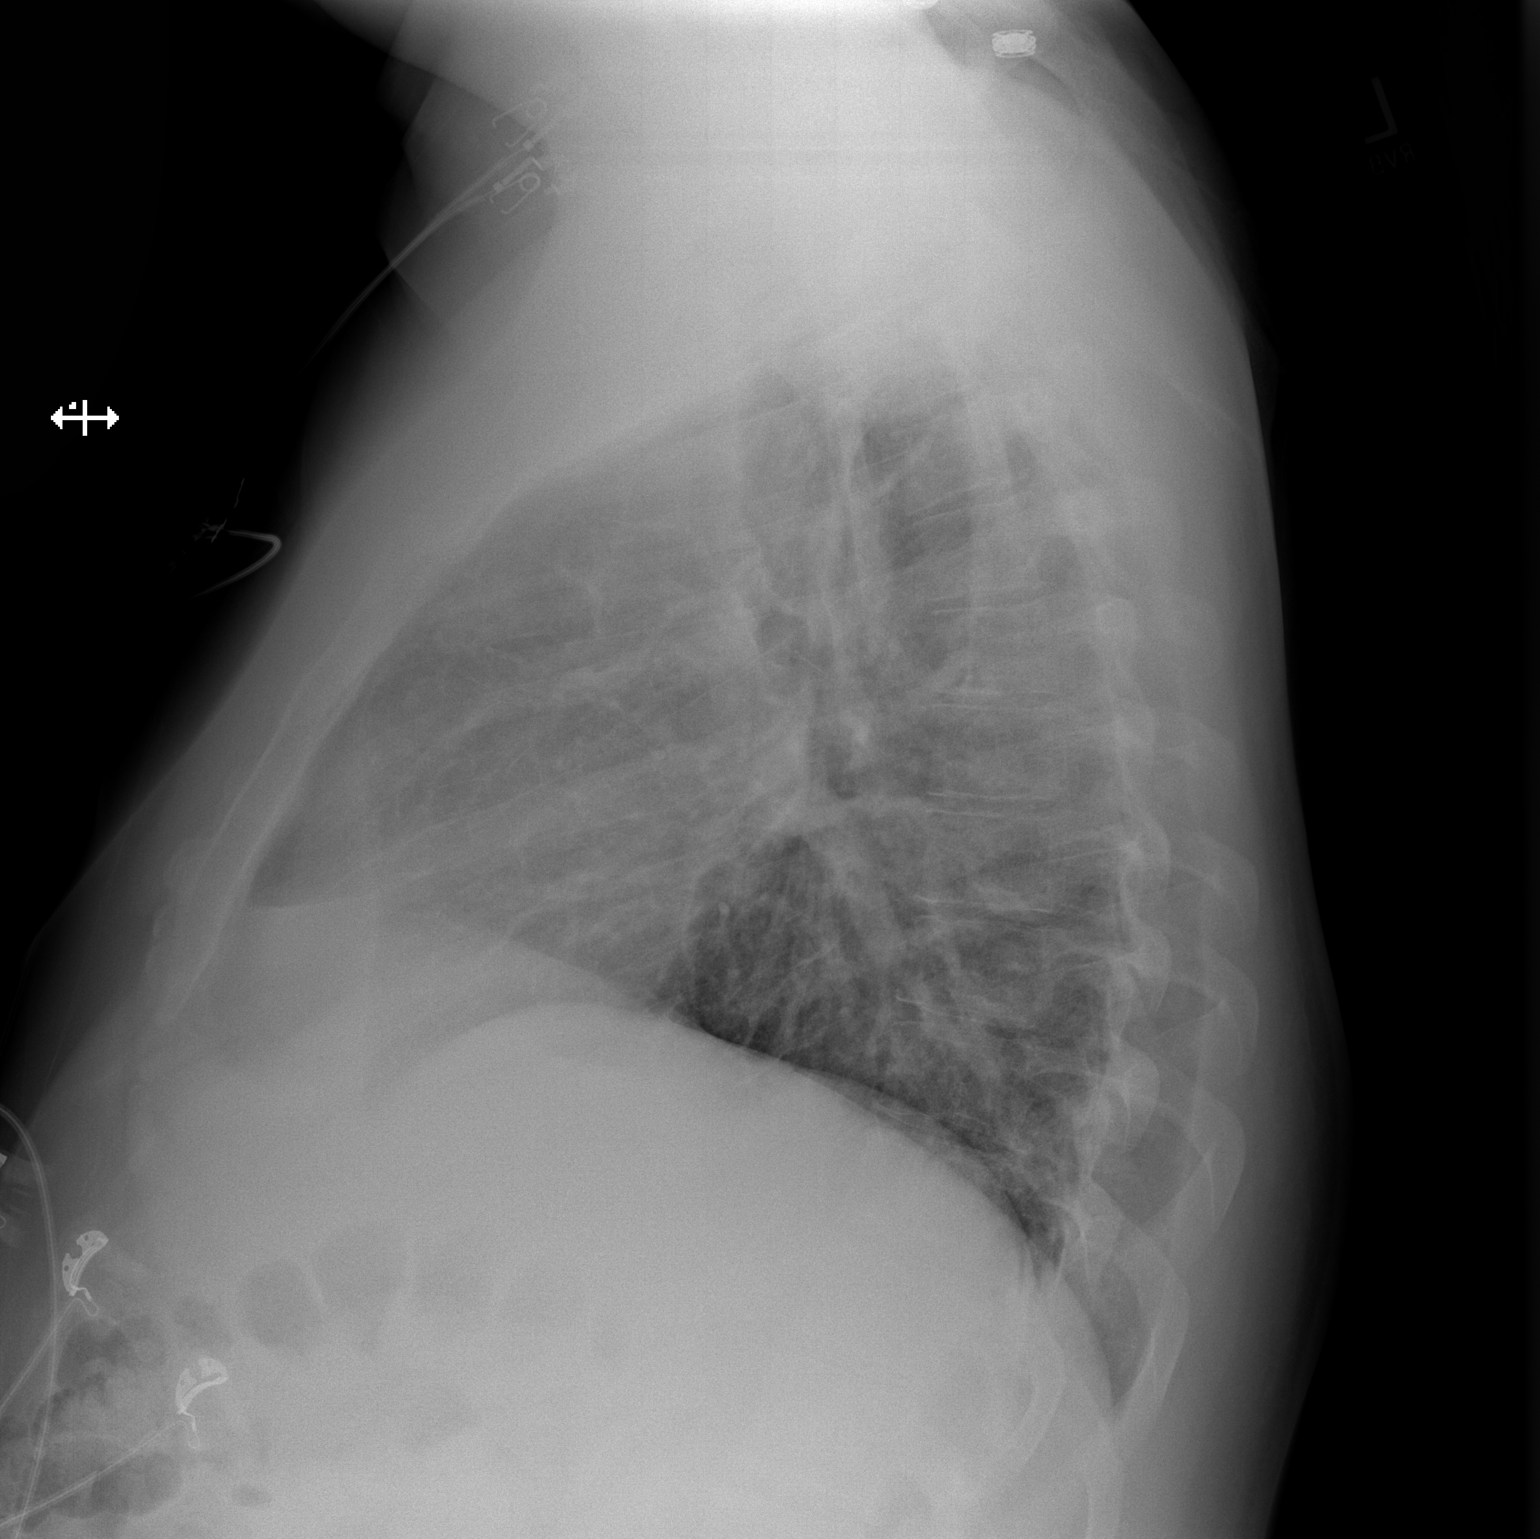

[2 of 2 positions shown; findings below may reference images not displayed]

FINDINGS: LEFT upper lobe atelectasis with air bronchograms. Strandy densities
LEFT lung base. RIGHT lung is clear. No pleural effusion. No
pneumothorax. Air distended proximal esophagus. Cardiac silhouette
is normal. Soft tissue planes and included osseous structures are
nonsuspicious.
IMPRESSION: Apparent LEFT upper lobe atelectasis versus scarring, with
superimposed possible pneumonia. Recommend CT chest to assess for
endobronchial lesion. LEFT lung base atelectasis/scarring.

## 2017-09-02 ENCOUNTER — Encounter (HOSPITAL_COMMUNITY): Payer: Self-pay | Admitting: *Deleted

## 2017-09-02 ENCOUNTER — Telehealth (HOSPITAL_COMMUNITY): Payer: Self-pay | Admitting: *Deleted

## 2017-09-02 DIAGNOSIS — Z8572 Personal history of non-Hodgkin lymphomas: Secondary | ICD-10-CM | POA: Diagnosis not present

## 2017-09-02 DIAGNOSIS — I5043 Acute on chronic combined systolic (congestive) and diastolic (congestive) heart failure: Secondary | ICD-10-CM | POA: Insufficient documentation

## 2017-09-02 DIAGNOSIS — G4730 Sleep apnea, unspecified: Secondary | ICD-10-CM | POA: Diagnosis not present

## 2017-09-02 DIAGNOSIS — R000 Tachycardia, unspecified: Secondary | ICD-10-CM | POA: Insufficient documentation

## 2017-09-02 DIAGNOSIS — E876 Hypokalemia: Secondary | ICD-10-CM | POA: Diagnosis not present

## 2017-09-02 DIAGNOSIS — F1410 Cocaine abuse, uncomplicated: Secondary | ICD-10-CM | POA: Diagnosis not present

## 2017-09-02 DIAGNOSIS — I427 Cardiomyopathy due to drug and external agent: Secondary | ICD-10-CM | POA: Diagnosis not present

## 2017-09-02 DIAGNOSIS — L600 Ingrowing nail: Secondary | ICD-10-CM | POA: Insufficient documentation

## 2017-09-02 DIAGNOSIS — T508X5A Adverse effect of diagnostic agents, initial encounter: Secondary | ICD-10-CM | POA: Diagnosis not present

## 2017-09-02 DIAGNOSIS — J449 Chronic obstructive pulmonary disease, unspecified: Secondary | ICD-10-CM | POA: Diagnosis not present

## 2017-09-02 DIAGNOSIS — Z9221 Personal history of antineoplastic chemotherapy: Secondary | ICD-10-CM | POA: Insufficient documentation

## 2017-09-02 DIAGNOSIS — Z87891 Personal history of nicotine dependence: Secondary | ICD-10-CM | POA: Diagnosis not present

## 2017-09-02 DIAGNOSIS — Z79899 Other long term (current) drug therapy: Secondary | ICD-10-CM | POA: Insufficient documentation

## 2017-09-02 DIAGNOSIS — I110 Hypertensive heart disease with heart failure: Principal | ICD-10-CM | POA: Insufficient documentation

## 2017-09-02 DIAGNOSIS — I509 Heart failure, unspecified: Secondary | ICD-10-CM | POA: Diagnosis present

## 2017-09-02 LAB — CBC
HCT: 32.1 % — ABNORMAL LOW (ref 39.0–52.0)
Hemoglobin: 8.9 g/dL — ABNORMAL LOW (ref 13.0–17.0)
MCH: 19.6 pg — AB (ref 26.0–34.0)
MCHC: 27.7 g/dL — ABNORMAL LOW (ref 30.0–36.0)
MCV: 70.7 fL — ABNORMAL LOW (ref 78.0–100.0)
Platelets: 380 10*3/uL (ref 150–400)
RBC: 4.54 MIL/uL (ref 4.22–5.81)
RDW: 21.7 % — ABNORMAL HIGH (ref 11.5–15.5)
WBC: 5.7 10*3/uL (ref 4.0–10.5)

## 2017-09-02 NOTE — ED Triage Notes (Signed)
Pt c/o bilateral ankle edema spreading up to knees for the past few days. Reports hx of same in the past in which lasix has helped decrease the swelling. Pt was seen two weeks ago and had his lasix increased to 80mg  bid for a week; reports swelling increased after he stopped taking the extra dose

## 2017-09-02 NOTE — Telephone Encounter (Signed)
Patient called back requesting for an appointment to be seen.  He is having active chest pain with fluid in his ankles and abdomin.  Patient stated he had to move away for awhile and has gotten off track.  I advised him with the active chest pain that he needed to immediately go to the ER to be seen.  Patient agrees to go to the ER and will call us back for follow up appointment.

## 2017-09-02 NOTE — Telephone Encounter (Signed)
Patient left VM on triage line asking for [place] to call him back.  I called patient back but had to leave VM.

## 2017-09-03 ENCOUNTER — Other Ambulatory Visit: Payer: Self-pay

## 2017-09-03 ENCOUNTER — Observation Stay (HOSPITAL_COMMUNITY)
Admission: EM | Admit: 2017-09-03 | Discharge: 2017-09-04 | Disposition: A | Discharge status: Home / Self Care | Payer: Medicaid Other | Attending: Internal Medicine | Admitting: Internal Medicine

## 2017-09-03 ENCOUNTER — Encounter (HOSPITAL_COMMUNITY): Payer: Self-pay | Admitting: Internal Medicine

## 2017-09-03 DIAGNOSIS — Z923 Personal history of irradiation: Secondary | ICD-10-CM | POA: Diagnosis not present

## 2017-09-03 DIAGNOSIS — Z630 Problems in relationship with spouse or partner: Secondary | ICD-10-CM

## 2017-09-03 DIAGNOSIS — I10 Essential (primary) hypertension: Secondary | ICD-10-CM | POA: Diagnosis present

## 2017-09-03 DIAGNOSIS — F17220 Nicotine dependence, chewing tobacco, uncomplicated: Secondary | ICD-10-CM

## 2017-09-03 DIAGNOSIS — I509 Heart failure, unspecified: Secondary | ICD-10-CM

## 2017-09-03 DIAGNOSIS — Z8572 Personal history of non-Hodgkin lymphomas: Secondary | ICD-10-CM

## 2017-09-03 DIAGNOSIS — F1410 Cocaine abuse, uncomplicated: Secondary | ICD-10-CM

## 2017-09-03 DIAGNOSIS — E876 Hypokalemia: Secondary | ICD-10-CM | POA: Diagnosis not present

## 2017-09-03 DIAGNOSIS — L0390 Cellulitis, unspecified: Secondary | ICD-10-CM

## 2017-09-03 DIAGNOSIS — Z9114 Patient's other noncompliance with medication regimen: Secondary | ICD-10-CM

## 2017-09-03 DIAGNOSIS — J45909 Unspecified asthma, uncomplicated: Secondary | ICD-10-CM

## 2017-09-03 DIAGNOSIS — I5023 Acute on chronic systolic (congestive) heart failure: Secondary | ICD-10-CM | POA: Diagnosis not present

## 2017-09-03 DIAGNOSIS — I5042 Chronic combined systolic (congestive) and diastolic (congestive) heart failure: Secondary | ICD-10-CM

## 2017-09-03 DIAGNOSIS — I428 Other cardiomyopathies: Secondary | ICD-10-CM

## 2017-09-03 DIAGNOSIS — R000 Tachycardia, unspecified: Secondary | ICD-10-CM | POA: Diagnosis not present

## 2017-09-03 HISTORY — DX: Unspecified chronic bronchitis: J42

## 2017-09-03 HISTORY — DX: Cocaine abuse, uncomplicated: F14.10

## 2017-09-03 HISTORY — DX: Headache, unspecified: R51.9

## 2017-09-03 HISTORY — DX: Headache: R51

## 2017-09-03 HISTORY — DX: Low back pain: M54.5

## 2017-09-03 HISTORY — DX: Non-Hodgkin lymphoma, unspecified, unspecified site: C85.90

## 2017-09-03 HISTORY — DX: Gastro-esophageal reflux disease without esophagitis: K21.9

## 2017-09-03 HISTORY — DX: Low back pain, unspecified: M54.50

## 2017-09-03 HISTORY — DX: Hypothyroidism, unspecified: E03.9

## 2017-09-03 HISTORY — DX: Pneumonia, unspecified organism: J18.9

## 2017-09-03 HISTORY — DX: Other chronic pain: G89.29

## 2017-09-03 HISTORY — DX: Personal history of other medical treatment: Z92.89

## 2017-09-03 LAB — BASIC METABOLIC PANEL
ANION GAP: 8 (ref 5–15)
Anion gap: 7 (ref 5–15)
BUN: 14 mg/dL (ref 6–20)
BUN: 14 mg/dL (ref 6–20)
CO2: 26 mmol/L (ref 22–32)
CO2: 28 mmol/L (ref 22–32)
Calcium: 8.4 mg/dL — ABNORMAL LOW (ref 8.9–10.3)
Calcium: 8.9 mg/dL (ref 8.9–10.3)
Chloride: 100 mmol/L — ABNORMAL LOW (ref 101–111)
Chloride: 102 mmol/L (ref 101–111)
Creatinine, Ser: 1.04 mg/dL (ref 0.61–1.24)
Creatinine, Ser: 0.96 mg/dL (ref 0.61–1.24)
GFR calc Af Amer: >60 mL/min (ref >60)
GFR calc Af Amer: >60 mL/min (ref >60)
GFR calc non Af Amer: >60 mL/min (ref >60)
GFR calc non Af Amer: >60 mL/min (ref >60)
GLUCOSE: 121 mg/dL — AB (ref 65–99)
Glucose, Bld: 96 mg/dL (ref 65–99)
POTASSIUM: 3 mmol/L — AB (ref 3.5–5.1)
Potassium: 3.7 mmol/L (ref 3.5–5.1)
Sodium: 134 mmol/L — ABNORMAL LOW (ref 135–145)
Sodium: 137 mmol/L (ref 135–145)

## 2017-09-03 LAB — RAPID URINE DRUG SCREEN, HOSP PERFORMED
Amphetamines: NOT DETECTED
Barbiturates: NOT DETECTED
Benzodiazepines: NOT DETECTED
Cocaine: POSITIVE — AB
Opiates: NOT DETECTED
Tetrahydrocannabinol: NOT DETECTED

## 2017-09-03 LAB — URINALYSIS, ROUTINE W REFLEX MICROSCOPIC
BILIRUBIN URINE: NEGATIVE
Bacteria, UA: NONE SEEN
Glucose, UA: NEGATIVE mg/dL
Hgb urine dipstick: NEGATIVE
KETONES UR: NEGATIVE mg/dL
LEUKOCYTES UA: NEGATIVE
Nitrite: NEGATIVE
PH: 6 (ref 5.0–8.0)
Protein, ur: 100 mg/dL — AB
SQUAMOUS EPITHELIAL / LPF: NONE SEEN
Specific Gravity, Urine: 1.017 (ref 1.005–1.030)

## 2017-09-03 LAB — BRAIN NATRIURETIC PEPTIDE: B Natriuretic Peptide: 522.1 pg/mL — ABNORMAL HIGH (ref 0.0–100.0)

## 2017-09-03 LAB — MAGNESIUM: Magnesium: 2.2 mg/dL (ref 1.7–2.4)

## 2017-09-03 MED ORDER — POTASSIUM CHLORIDE CRYS ER 20 MEQ PO TBCR
40.0000 meq | EXTENDED_RELEASE_TABLET | Freq: Two times a day (BID) | ORAL | Status: DC
  Administered 2017-09-03 – 2017-09-04 (×3): 40 meq via ORAL
  Filled 2017-09-03 (×3): qty 2

## 2017-09-03 MED ORDER — SODIUM CHLORIDE 0.9% FLUSH
3.0000 mL | Freq: Two times a day (BID) | INTRAVENOUS | Status: DC
  Administered 2017-09-03: 3 mL via INTRAVENOUS

## 2017-09-03 MED ORDER — SACUBITRIL-VALSARTAN 49-51 MG PO TABS
1.0000 | ORAL_TABLET | Freq: Two times a day (BID) | ORAL | Status: DC
  Administered 2017-09-03 – 2017-09-04 (×3): 1 via ORAL
  Filled 2017-09-03 (×5): qty 1

## 2017-09-03 MED ORDER — SODIUM CHLORIDE 0.9 % IV SOLN: 250.0000 mL | INTRAVENOUS | Status: DC | PRN

## 2017-09-03 MED ORDER — SODIUM CHLORIDE 0.9% FLUSH
3.0000 mL | Freq: Two times a day (BID) | INTRAVENOUS | Status: DC
  Administered 2017-09-03 – 2017-09-04 (×2): 3 mL via INTRAVENOUS

## 2017-09-03 MED ORDER — MAGNESIUM SULFATE 2 GM/50ML IV SOLN
2.0000 g | Freq: Once | INTRAVENOUS | Status: AC
  Administered 2017-09-03: 2 g via INTRAVENOUS
  Filled 2017-09-03: qty 50

## 2017-09-03 MED ORDER — SODIUM CHLORIDE 0.9% FLUSH: 3.0000 mL | INTRAVENOUS | Status: DC | PRN

## 2017-09-03 MED ORDER — ENOXAPARIN SODIUM 40 MG/0.4ML ~~LOC~~ SOLN
40.0000 mg | Freq: Every day | SUBCUTANEOUS | Status: DC
  Administered 2017-09-03 – 2017-09-04 (×2): 40 mg via SUBCUTANEOUS
  Filled 2017-09-03 (×2): qty 0.4

## 2017-09-03 MED ORDER — FUROSEMIDE 10 MG/ML IJ SOLN
40.0000 mg | Freq: Two times a day (BID) | INTRAMUSCULAR | Status: DC
  Administered 2017-09-03 – 2017-09-04 (×2): 40 mg via INTRAVENOUS
  Filled 2017-09-03 (×2): qty 4

## 2017-09-03 MED ORDER — FUROSEMIDE 10 MG/ML IJ SOLN
40.0000 mg | INTRAMUSCULAR | Status: AC
  Administered 2017-09-03: 40 mg via INTRAVENOUS
  Filled 2017-09-03: qty 4

## 2017-09-03 MED ORDER — SPIRONOLACTONE 12.5 MG HALF TABLET
12.5000 mg | ORAL_TABLET | Freq: Every day | ORAL | Status: DC
Start: 2017-09-03 — End: 2017-09-04
  Administered 2017-09-03 – 2017-09-04 (×2): 12.5 mg via ORAL
  Filled 2017-09-03 (×3): qty 1

## 2017-09-03 NOTE — ED Notes (Signed)
Provided pt with a lunch tray.  

## 2017-09-03 NOTE — ED Provider Notes (Signed)
[facility] EMERGENCY DEPARTMENT Provider Note   CSN: [id number] Arrival date & time: 09/02/17  2249     History   Chief Complaint Chief Complaint  Patient presents with  . Leg Swelling    HPI Leonard [name] is a 42 y.o. male.  HPI  The patient is a 42 year old male, he has a known history of non-Hodgkin's lymphoma for which he underwent chemo and radiation, he then developed congestive heart failure which was thought to be in relation to the history of chemotherapy use.  The patient also has a history of cocaine abuse and last used 48 hours ago.  Recently the patient states that he has been having some difficulty taking his diuretics because of some lightheadedness, he has not been taking them regularly, he has noted increased amounts of swelling in his bilateral legs and is now having significant difficulty walking because of his swollen legs.  He does not feel short of breath but has had a mild tachycardia, he does not have fevers or chills nausea vomiting or diarrhea.  He has had multiple visits for the emergency department for shortness of breath and swelling in the past and recently was told he need to be admitted but refused.  He states he last went on a cocaine binge 48 hours ago and since that time is become significantly more swollen.  Nothing seems to make this better, it gets worse when he does not take his Lasix, it is not associated with fevers though he does note some redness to his right leg.  Past Medical History:  Diagnosis Date  . AKI (acute kidney injury) ([place]) 11/09/2016  . Asthma   . Blood transfusion without reported diagnosis   . CHF (congestive heart failure) ([place]) 04/05/2016  . COPD (chronic obstructive pulmonary disease) ([place])   . Hypertension   . Lymphoma ([place])    x 2  . Sleep apnea   . Thyroid disease     Patient Active Problem List   Diagnosis Date Noted  . Acute on chronic HFrEF (heart failure with reduced ejection fraction) ([place])  09/03/2017  . Lightheaded 01/22/2017  . AKI (acute kidney injury) ([place]) 11/09/2016  . Abdominal wall abscess at site of surgical wound   . Fever   . Cellulitis 10/22/2016  . Postoperative ileus ([place]) 10/21/2016  . Incarcerated hernia 10/18/2016  . Right lower lobe lung mass 10/18/2016  . Incarcerated umbilical hernia   . Asthma 10/06/2016  . History of substance abuse 10/06/2016  . TSH elevation 10/06/2016  . Bronchitis 10/06/2016  . Chronic pain syndrome 10/06/2016  . History of non-Hodgkin's lymphoma 04/30/2016  . Snoring 04/30/2016  . Chronic combined systolic and diastolic congestive heart failure ([place]) 04/05/2016  . Essential hypertension 10/09/2015    Past Surgical History:  Procedure Laterality Date  . CARDIAC CATHETERIZATION N/A 04/15/2016   Procedure: Right/Left Heart Cath and Coronary Angiography;  Surgeon: Larey Dresser, MD;  Location: [facility];  Service: Cardiovascular;  Laterality: N/A;  . IR RADIOLOGIST EVAL & MGMT  11/19/2016  . IR RADIOLOGIST EVAL & MGMT  11/05/2016  . LAPAROSCOPIC ASSISTED SPIGELIAN HERNIA REPAIR N/A 10/18/2016   Procedure: LAPAROSCOPIC REPAIR OF UMBILICAL HERNIA  WITH MESH AND LYSIS OF ADHESIONS.;  Surgeon: [name], MD;  Location: [facility];  Service: General;  Laterality: N/A;  . tumor biopsy    . WISDOM TOOTH EXTRACTION         Home Medications    Prior to Admission medications  Medication Sig Start Date End Date Taking? Authorizing Provider  acetaminophen (TYLENOL) 500 MG tablet Take 1 tablet (500 mg total) by mouth every 6 (six) hours as needed. Patient not taking: Reported on 10/21/2016 01/01/16   Marella Chimes, PA-C  albuterol (PROVENTIL HFA;VENTOLIN HFA) 108 [phone] Base) MCG/ACT inhaler Inhale 2 puffs into the lungs every 6 (six) hours as needed for wheezing or shortness of breath. 10/06/16   Burns, Arloa Koh, MD  albuterol (PROVENTIL HFA;VENTOLIN HFA) 108 (90 Base) MCG/ACT inhaler Inhale 1-2 puffs into the lungs  every 4 (four) hours as needed for wheezing or shortness of breath. Patient not taking: Reported on 01/22/2017 01/02/17   Maryan Puls, MD  albuterol (PROVENTIL) (2.5 MG/3ML) 0.083% nebulizer solution Take 3 mLs (2.5 mg total) by nebulization every 6 (six) hours as needed for wheezing or shortness of breath. Patient not taking: Reported on 01/22/2017 10/06/16   Florinda Marker, MD  benzonatate (TESSALON) 100 MG capsule Take 1 capsule (100 mg total) by mouth every 8 (eight) hours. Patient not taking: Reported on 01/22/2017 01/02/17   Maryan Puls, MD  carvedilol (COREG) 6.25 MG tablet Take 1 tablet (6.25 mg total) by mouth 2 (two) times daily with a meal. Patient not taking: Reported on 08/02/2017 01/22/17   Shirley Friar, PA-C  carvedilol (COREG) 6.25 MG tablet Take 1 tablet (6.25 mg total) by mouth 2 (two) times daily with a meal. 08/02/17   Virgel Manifold, MD  fluticasone [facility]) 50 MCG/ACT nasal spray Place 2 sprays into both nostrils daily. Patient not taking: Reported on 01/22/2017 10/06/16   Florinda Marker, MD  Fluticasone-Salmeterol (ADVAIR) 250-50 MCG/DOSE AEPB Inhale 2 puffs into the lungs 2 (two) times daily. Patient not taking: Reported on 01/22/2017 10/06/16   Florinda Marker, MD  furosemide (LASIX) 40 MG tablet Take 1 tablet (40 mg total) by mouth daily. 03/10/17   Bensimhon, Shaune Pascal, MD  furosemide (LASIX) 40 MG tablet Take 1 tablet (40 mg total) by mouth 2 (two) times daily. 08/02/17   Virgel Manifold, MD  gabapentin (NEURONTIN) 800 MG tablet Take 1 tablet (800 mg total) by mouth 3 (three) times daily. Patient not taking: Reported on 01/22/2017 11/09/16   Velna Ochs, MD  guaiFENesin-dextromethorphan [facility] DM) 100-10 MG/5ML syrup Take 5 mLs by mouth every 4 (four) hours as needed for cough. Patient not taking: Reported on 11/04/2016 10/06/16   Florinda Marker, MD  ibuprofen (ADVIL,MOTRIN) 200 MG tablet Take 400-600 mg by mouth every 6 (six) hours as needed for headache (pain).     [provider]  Oxycodone HCl 10 MG TABS Take 1 tablet (10 mg total) by mouth every 6 (six) hours as needed. Patient not taking: Reported on 01/22/2017 11/09/16   Velna Ochs, MD  pantoprazole (PROTONIX) 40 MG tablet Take 1 tablet (40 mg total) by mouth daily. Patient not taking: Reported on 01/22/2017 10/06/16   Florinda Marker, MD  sacubitril-valsartan (ENTRESTO) 24-26 MG Take 1 tablet by mouth 2 (two) times daily. Patient not taking: Reported on 08/02/2017 01/22/17   Shirley Friar, PA-C  sacubitril-valsartan (ENTRESTO) 49-51 MG Take 1 tablet by mouth 2 (two) times daily. 08/02/17   Virgel Manifold, MD  simethicone (MYLICON) 80 MG chewable tablet Chew 1 tablet (80 mg total) by mouth every 6 (six) hours as needed for flatulence (gas pains, distended belly). Patient not taking: Reported on 01/22/2017 11/16/16   Alphonzo Grieve, MD  spironolactone (ALDACTONE) 25 MG tablet TAKE 1/2 TABLET BY MOUTH EACH  DAY Patient not taking: Reported on 01/22/2017 09/03/16   Conrad [place], NP    Family History Family History  Problem Relation Age of Onset  . Cancer Mother   . Emphysema Mother   . Bronchitis Mother     Social History Social History   Tobacco Use  . Smoking status: Former Smoker    Packs/day: 1.25    Years: 10.00    Pack years: 12.50    Types: Cigarettes    Last attempt to quit: 04/08/2007    Years since quitting: 10.4  . Smokeless tobacco: Current User    Types: Snuff  Substance Use Topics  . Alcohol use: No    Alcohol/week: 0.0 oz  . Drug use: Yes    Types: Cocaine     Allergies   Patient has no known allergies.   Review of Systems Review of Systems  All other systems reviewed and are negative.    Physical Exam Updated Vital Signs BP (!) 132/99   Pulse (!) 107   Temp 98.3 F (36.8 C) (Oral)   Resp (!) 22   SpO2 99%   Physical Exam  Constitutional: He appears well-developed and well-nourished. No distress.  HENT:  Head: Normocephalic and  atraumatic.  Mouth/Throat: Oropharynx is clear and moist. No oropharyngeal exudate.  Eyes: Conjunctivae and EOM are normal. Pupils are equal, round, and reactive to light. Right eye exhibits no discharge. Left eye exhibits no discharge. No scleral icterus.  Neck: Normal range of motion. Neck supple. No JVD present. No thyromegaly present.  Cardiovascular: Regular rhythm, normal heart sounds and intact distal pulses. Exam reveals no gallop and no friction rub.  No murmur heard. Tachycardic to 110  Pulmonary/Chest: Effort normal and breath sounds normal. No respiratory distress. He has no wheezes. He has no rales.  Abdominal: Soft. Bowel sounds are normal. He exhibits no distension and no mass. There is no tenderness.  Musculoskeletal: Normal range of motion. He exhibits edema ( 3+ pitting edema symmetrical below the knees). He exhibits no tenderness.  Lymphadenopathy:    He has no cervical adenopathy.  Neurological: He is alert. Coordination normal.  Skin: Skin is warm and dry. No rash noted. There is erythema ( Redness of the right leg on the medial surface, warm to the touch, no induration).  Psychiatric: He has a normal mood and affect. His behavior is normal.  Nursing note and vitals reviewed.    ED Treatments / Results  Labs (all labs ordered are listed, but only abnormal results are displayed) Labs Reviewed  BASIC METABOLIC PANEL - Abnormal; Notable for the following components:      Result Value   Sodium 134 (*)    Potassium 3.0 (*)    Chloride 100 (*)    Glucose, Bld 121 (*)    Calcium 8.4 (*)    All other components within normal limits  CBC - Abnormal; Notable for the following components:   Hemoglobin 8.9 (*)    HCT 32.1 (*)    MCV 70.7 (*)    MCH 19.6 (*)    MCHC 27.7 (*)    RDW 21.7 (*)    All other components within normal limits  URINALYSIS, ROUTINE W REFLEX MICROSCOPIC - Abnormal; Notable for the following components:   Protein, ur 100 (*)    All other  components within normal limits  BRAIN NATRIURETIC PEPTIDE    EKG  EKG Interpretation  Date/Time:  Thursday September 02 2017 23:09:07 EST Ventricular Rate:  110 PR  Interval:  160 QRS Duration: 98 QT Interval:  370 QTC Calculation: 500 R Axis:   75 Text Interpretation:  Sinus tachycardia Nonspecific T wave abnormality Abnormal ekg Confirmed by Noemi Chapel [phone]) on 09/03/2017 8:14:35 AM       Radiology No results found.  Procedures Procedures (including critical care time)  Medications Ordered in ED Medications  furosemide (LASIX) injection 40 mg (not administered)     Initial Impression / Assessment and Plan / ED Course  I have reviewed the triage vital signs and the nursing notes.  Pertinent labs & imaging results that were available during my care of the patient were reviewed by me and considered in my medical decision making (see chart for details).    There is concerned that the patient has a cellulitis on top of his bilateral significant lower extremity edema.  Last echocardiogram was aborted of 2018 showing an ejection fraction of approximately 40%, heart catheterization from August 2017 showed no obstructive cardiac disease.  I will give the patient some IV Lasix as well as some antibiotics and likely need to admit for diuresis.  Labs are otherwise unremarkable with a chronic anemia, no leukocytosis.  He does not have any shortness of breath or hypoxia to suggest the need for an x-ray but clearly has a fluid overloaded state and will need diuresis.  He has no family doctor, a consult with unassigned medical admission service.  Labs reviewed, discussed with the consultants, internal medicine resident, the patient will be admitted for ongoing diuresis  Final Clinical Impressions(s) / ED Diagnoses   Final diagnoses:  Acute on chronic congestive heart failure, unspecified heart failure type [facility])    ED Discharge Orders    None       Noemi Chapel, MD 09/03/17  [phone]

## 2017-09-03 NOTE — ED Notes (Signed)
Report was given and pt transported to [facility]
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
## 2017-09-03 NOTE — ED Notes (Signed)
Heart healthy lunch tray ordered @ 1100

## 2017-09-04 DIAGNOSIS — Z79899 Other long term (current) drug therapy: Secondary | ICD-10-CM

## 2017-09-04 DIAGNOSIS — I5023 Acute on chronic systolic (congestive) heart failure: Secondary | ICD-10-CM | POA: Diagnosis not present

## 2017-09-04 DIAGNOSIS — F1410 Cocaine abuse, uncomplicated: Secondary | ICD-10-CM

## 2017-09-04 DIAGNOSIS — L818 Other specified disorders of pigmentation: Secondary | ICD-10-CM | POA: Diagnosis not present

## 2017-09-04 DIAGNOSIS — R000 Tachycardia, unspecified: Secondary | ICD-10-CM | POA: Diagnosis not present

## 2017-09-04 DIAGNOSIS — I110 Hypertensive heart disease with heart failure: Secondary | ICD-10-CM | POA: Diagnosis not present

## 2017-09-04 DIAGNOSIS — E876 Hypokalemia: Secondary | ICD-10-CM

## 2017-09-04 LAB — BASIC METABOLIC PANEL
Anion gap: 7 (ref 5–15)
BUN: 15 mg/dL (ref 6–20)
CO2: 22 mmol/L (ref 22–32)
Calcium: 8.5 mg/dL — ABNORMAL LOW (ref 8.9–10.3)
Chloride: 106 mmol/L (ref 101–111)
Creatinine, Ser: 0.93 mg/dL (ref 0.61–1.24)
GFR calc Af Amer: >60 mL/min (ref >60)
GFR calc non Af Amer: >60 mL/min (ref >60)
Glucose, Bld: 102 mg/dL — ABNORMAL HIGH (ref 65–99)
Potassium: 4.3 mmol/L (ref 3.5–5.1)
Sodium: 135 mmol/L (ref 135–145)

## 2017-09-04 MED ORDER — FUROSEMIDE 40 MG PO TABS: 40.0000 mg | ORAL_TABLET | Freq: Two times a day (BID) | ORAL | 0 refills | Status: DC

## 2017-09-04 MED ORDER — ALBUTEROL SULFATE HFA 108 (90 BASE) MCG/ACT IN AERS: 2.0000 | INHALATION_SPRAY | Freq: Four times a day (QID) | RESPIRATORY_TRACT | 3 refills | Status: DC | PRN

## 2017-09-04 MED ORDER — POTASSIUM CHLORIDE CRYS ER 20 MEQ PO TBCR: 20.0000 meq | EXTENDED_RELEASE_TABLET | Freq: Every day | ORAL | 0 refills | Status: DC

## 2017-09-04 MED ORDER — FLUTICASONE-SALMETEROL 250-50 MCG/DOSE IN AEPB: 2.0000 | INHALATION_SPRAY | Freq: Two times a day (BID) | RESPIRATORY_TRACT | 3 refills | Status: DC

## 2017-09-04 MED ORDER — SPIRONOLACTONE 25 MG PO TABS: 12.5000 mg | ORAL_TABLET | Freq: Every day | ORAL | 2 refills | Status: DC

## 2017-09-04 MED ORDER — CARVEDILOL 6.25 MG PO TABS: 6.2500 mg | ORAL_TABLET | Freq: Two times a day (BID) | ORAL | 6 refills | Status: DC

## 2017-09-04 MED ORDER — SPIRONOLACTONE 25 MG PO TABS: 12.5000 mg | ORAL_TABLET | Freq: Once | ORAL | 2 refills | Status: DC

## 2017-09-04 NOTE — Discharge Instructions (Signed)
Leonard [name], you were admitted due to exacerbation of your heart failure. You were given Lasix through the IV and responded very well to the remainder of your medications being re-started. In order to prevent this from happening again it is important that you take your prescription medications every day as instructed! You still have a little extra fluid on you. Please weigh yourself when you get home. You will take double your normal dose of lasix for 1 week when you leave the hospital. Starting Sunday 09/05/17, please take 80mg  Lasix twice daily for 7 days, then resume your normal dose of 40mg  twice daily. I will have the Internal Medicine Clinic call you with your follow-up appointment. If you do not receive a call from them by Wednesday, please call the clinic at the number listed above.

## 2017-09-04 NOTE — Discharge Summary (Signed)
Name: Leonard [name] MRN: [id number] DOB: [birth year] 42 y.o. PCP: Alphonzo Grieve, MD  Date of Admission: 09/03/2017  6:25 AM Date of Discharge: 09/04/17 Attending Physician: Oda Kilts, MD  Discharge Diagnosis: 1. Acute on chronic HFrEF 2. Essential hypertension 3. Cocaine abuse  Discharge Medications: Allergies as of 09/04/2017   No Known Allergies     Medication List    STOP taking these medications   benzonatate 100 MG capsule Commonly known as:  TESSALON   gabapentin 800 MG tablet Commonly known as:  NEURONTIN   guaiFENesin-dextromethorphan 100-10 MG/5ML syrup Commonly known as:  ROBITUSSIN DM   Oxycodone HCl 10 MG Tabs   pantoprazole 40 MG tablet Commonly known as:  PROTONIX   simethicone 80 MG chewable tablet Commonly known as:  MYLICON     TAKE these medications   acetaminophen 500 MG tablet Commonly known as:  TYLENOL Take 1 tablet (500 mg total) by mouth every 6 (six) hours as needed.   albuterol 108 (90 Base) MCG/ACT inhaler Commonly known as:  PROVENTIL HFA;VENTOLIN HFA Inhale 2 puffs into the lungs every 6 (six) hours as needed for wheezing or shortness of breath. What changed:  Another medication with the same name was removed. Continue taking this medication, and follow the directions you see here.   carvedilol 6.25 MG tablet Commonly known as:  COREG Take 1 tablet (6.25 mg total) by mouth 2 (two) times daily with a meal. What changed:  Another medication with the same name was removed. Continue taking this medication, and follow the directions you see here.   fluticasone 50 MCG/ACT nasal spray Commonly known as:  FLONASE Place 2 sprays into both nostrils daily.   Fluticasone-Salmeterol 250-50 MCG/DOSE Aepb Commonly known as:  ADVAIR Inhale 2 puffs into the lungs 2 (two) times daily.   furosemide 40 MG tablet Commonly known as:  LASIX Take 1 tablet (40 mg total) by mouth daily.   furosemide 40 MG tablet Commonly known as:   LASIX Take 1 tablet (40 mg total) by mouth 2 (two) times daily.   potassium chloride SA 20 MEQ tablet Commonly known as:  K-DUR,KLOR-CON Take 1 tablet (20 mEq total) by mouth daily. What changed:  when to take this   sacubitril-valsartan 24-26 MG Commonly known as:  ENTRESTO Take 1 tablet by mouth 2 (two) times daily. What changed:  Another medication with the same name was removed. Continue taking this medication, and follow the directions you see here.   spironolactone 25 MG tablet Commonly known as:  ALDACTONE Take 0.5 tablets (12.5 mg total) by mouth once for 1 dose. What changed:  See the new instructions.       Disposition and follow-up:   LeonardLeonard [name] was discharged from [facility] in Good condition.  At the hospital follow up visit please address:  1.  His current weight and fluid status. Weight at time of discharge was 240 lbs and dry weight is ~230 lbs. Pt was instructed to double his dosing of Lasix for 1 week before resuming at 40 mg BID.   2.  Follow-up with an outpatient oncologist. Referral has been made.   3.  Follow-up with an outpatient podiatrist for ingrown toenails. Referral has been made.   Follow-up Appointments:   Alphonzo Grieve, MD. Schedule an appointment as soon as possible for a visit in 1 week(s).   Specialty:  Internal Medicine Contact information: [place] [address] [address] Hospital Course by  problem list:   1. Acute on chronic HFrEFfrom NICM  Pt was admitted on 1/11 with 3+ pitting leg edema to the knees and exertional SOB after he had not been taking his heart failure medications for several days. His Carvedilol was held, but his Entresto, Spironolactone, and Lasix were resumed with the Lasix at a higher dose of 40 mg IV BID. His exacerbation resolved after 24 hours of hospitalization with a net 7L of diuresis and a 13 lb weight loss to 240 lbs at discharge. Will double his dose of his home PO  Lasix at  80 mg BID x 1 wk to approach his dry weight of ~230 lbs.   2. Hypokalemia: - Initially 3.0 upon presentation but resolved to 4.3 with PO supplementation. Will continue supplementation at home.    3. Cocaine abuse: - Discussed and encouraged cessation at admission    Discharge Physical Exam:   BP (!) 142/96 (BP Location: Left Arm)   Pulse (!) 111   Temp 97.6 F (36.4 C) (Oral)   Resp 18   Ht 6' (1.829 m)   Wt 109.2 kg (240 lb 12.8 oz) Comment: c scale  SpO2 100%   BMI 32.66 kg/m   General Appearance:    Alert, cooperative, sitting up in chair, in no acute distress, appears stated age.  Lungs:     Clear to auscultation bilaterally, respirations unlabored  Heart:    Tachycardic, regular rhythm, S1 and S2 normal, no murmur, rub or gallop  Abdomen:     Soft, non-tender  Extremities:   1+ pitting edema in lower extremities bilaterally, Erythematous but not warm or tender over L lower leg  Skin:   Skin color appropriate for ethnicity. Tattoos diffusely over body    Pertinent Labs, Studies, and Procedures:  None  Discharge Instructions: Discharge Instructions    Ambulatory referral to Oncology   Complete by:  As directed    Hx of NHL, recurrent lung mass   Ambulatory referral to Podiatry   Complete by:  As directed      "Leonard [name], you were admitted due to exacerbation of your heart failure. You were given Lasix through the IV and responded very well to the remainder of your medications being re-started. In order to prevent this from happening again it is important that you take your prescription medications every day as instructed! You still have a little extra fluid on you. Please weigh yourself when you get home. You will take double your normal dose of lasix for 1 week when you leave the hospital. Starting Sunday 09/05/17, please take 80mg  Lasix twice daily for 7 days, then resume your normal dose of 40mg  twice daily. I will have the Internal Medicine Clinic  call you with your follow-up appointment. If you do not receive a call from them by Wednesday, please call the clinic at the number listed above."  Signed: Annye Asa, Medical Student 09/04/2017, 12:27 PM   [phone]

## 2017-09-04 NOTE — Progress Notes (Signed)
Message left at number on chart to notify of belonging Media planner) left in room when patient departed today. Left at [address] nurses station with patient's name, ready for pick up.

## 2017-09-04 NOTE — Progress Notes (Signed)
Call placed to CCMD to notify of telemetry monitoring d/c.   

## 2017-09-04 NOTE — Care Management Note (Signed)
Case Management Note  Patient Details  Name: Leonard [name] MRN: [id number] Date of Birth: 12-03-1975  Subjective/Objective:  CHF, NHL               Action/Plan: PCP is Dr Alphonzo Grieve; has private insurance with Medicaid with prescription drug coverage; CM following for progression of care; Nutritional Consult placed for making wise food choices and SW consulted for substance abuse;     Expected Discharge Date:   possibly 09/08/2017               Expected Discharge Plan:  [place]  In-House Referral:    SW, Nutritional  Discharge planning Services  CM Consult  Status of Service:  In process, will continue to follow  Sherrilyn Rist [phone] 09/04/2017, 8:29 AM

## 2017-09-04 NOTE — Progress Notes (Signed)
Nutrition Education Brief Note  RD consulted for diet education for CHF and "making wise food choices. Per chart review, pt has been non-compliant with medications and eating Ramen noodles.   Attempted to visit pt x 2. At both times, pt was sound asleep and lights in room were fully turned off.   Per discussion with nursing secretary, plan to d/c home today. Pt with active discharge orders.   Pallas Wahlert A. Jimmye Norman, RD, LDN, CDE Pager: [phone] After hours Pager: [phone]

## 2017-09-04 NOTE — Progress Notes (Signed)
Subjective: Pt reports feeling much better today after having significant urine output yesterday. He was able to walk down the hall without getting significantly winded. His legs and feet feel less tight, and he was able to regain his ROM around his ankle.   Objective: Vital signs in last 24 hours: Vitals:   09/03/17 1635 09/03/17 2057 09/04/17 0045 09/04/17 0643  BP: 128/84 111/81 (!) 132/93 (!) 142/96  Pulse:  (!) 106 (!) 119 (!) 111  Resp: 20 20 18    Temp: (!) 97.4 F (36.3 C) 98 F (36.7 C) 98.1 F (36.7 C) 97.6 F (36.4 C)  TempSrc: Oral Oral Oral Oral  SpO2: 100% 99% 98% 100%  Weight: 112 kg (247 lb)   109.2 kg (240 lb 12.8 oz)  Height: 6' (1.829 m)      Weight change: 13 lb weight loss  Intake/Output Summary (Last 24 hours) at 09/04/2017 1205 Last data filed at 09/04/2017 1100 Gross per 24 hour  Intake 1300 ml  Output 8175 ml  Net -6875 ml   BP (!) 142/96 (BP Location: Left Arm)   Pulse (!) 111   Temp 97.6 F (36.4 C) (Oral)   Resp 18   Ht 6' (1.829 m)   Wt 109.2 kg (240 lb 12.8 oz) Comment: c scale  SpO2 100%   BMI 32.66 kg/m   General Appearance:    Alert, cooperative, sitting up in chair, in no acute distress, appears stated age.  Lungs:     Clear to auscultation bilaterally, respirations unlabored  Heart:    Tachycardic, regular rhythm, S1 and S2 normal, no murmur, rub or gallop  Abdomen:     Soft, non-tender  Extremities:   1+ pitting edema in lower extremities bilaterally, Erythematous but not warm or tender over L lower leg  Skin:   Skin color appropriate for ethnicity. Tattoos diffusely over body   Lab Results: Na - 135, K - 4.3, Cl - 106, CO2 - 22, Cr - 0.93, Ca - 8.5 (L)  Micro Results: No results found for this hospitalization  Studies/Results: No results found.   Medications:  Scheduled Meds: . enoxaparin (LOVENOX) injection  40 mg Subcutaneous Daily  . furosemide  40 mg Intravenous BID  . potassium chloride  40 mEq Oral BID  .  sacubitril-valsartan  1 tablet Oral BID  . sodium chloride flush  3 mL Intravenous Q12H  . spironolactone  12.5 mg Oral Daily   PRN Meds:.sodium chloride, sodium chloride flush Assessment/Plan: Pt is a 41yoM with a hx of HFrEF who presented yesterday to the ED with leg edema and exertional SOB secondary to overload. Due to significant diuresis in the last 24 hours, his symptoms have largely resolved, and he may be discharged today.   1. Acute on chronic HFrEF from NICM - Exacerbation largely resolved with diuresis with a net 7 L output and 13 lb weight loss. Will double his dose of his home PO Lasix at  80 mg BID until he has reached his dry weight of ~230 lbs.  - Continue his Entresto and spironolactone at home - Resume his Carvedilol at home   2. Hypokalemia: - Resolved to 4.3 with PO supplementation  - Continue supplementation at home     3. Cocaine abuse: - Discussed and encouraged cessation at admission   4. Non-Hodgkins Lymphoma in remission - Treated with adriamycin in the past. No evidence of recurrence.  - Will make a referral to outpatient oncologist    5. Ingrown Toenails -  Will make a referral to outpatient podiatrist   Dispo: Discharge today  This is a Careers information officer Note.  The care of the patient was discussed with Dr. Danford Bad and Dr. Rebeca Alert, and the assessment and plan were formulated with their assistance.  Please see their attached note for official documentation of the daily encounter.   LOS: 0 days   Annye Asa, Medical Student 09/04/2017, 12:05 PM

## 2017-09-22 ENCOUNTER — Encounter (HOSPITAL_COMMUNITY): Payer: Medicaid Other

## 2017-10-07 ENCOUNTER — Ambulatory Visit: Payer: Medicaid Other | Admitting: Family Medicine

## 2017-10-14 ENCOUNTER — Emergency Department (HOSPITAL_COMMUNITY): Payer: Medicaid Other

## 2017-10-14 ENCOUNTER — Emergency Department (HOSPITAL_COMMUNITY)
Admission: EM | Admit: 2017-10-14 | Discharge: 2017-10-14 | Disposition: A | Discharge status: Home / Self Care | Payer: Medicaid Other | Attending: Emergency Medicine | Admitting: Emergency Medicine

## 2017-10-14 ENCOUNTER — Other Ambulatory Visit: Payer: Self-pay

## 2017-10-14 DIAGNOSIS — Z8572 Personal history of non-Hodgkin lymphomas: Secondary | ICD-10-CM | POA: Insufficient documentation

## 2017-10-14 DIAGNOSIS — F1410 Cocaine abuse, uncomplicated: Secondary | ICD-10-CM | POA: Diagnosis present

## 2017-10-14 DIAGNOSIS — F17228 Nicotine dependence, chewing tobacco, with other nicotine-induced disorders: Secondary | ICD-10-CM | POA: Diagnosis not present

## 2017-10-14 DIAGNOSIS — I110 Hypertensive heart disease with heart failure: Secondary | ICD-10-CM | POA: Diagnosis not present

## 2017-10-14 DIAGNOSIS — I5042 Chronic combined systolic (congestive) and diastolic (congestive) heart failure: Secondary | ICD-10-CM | POA: Insufficient documentation

## 2017-10-14 DIAGNOSIS — J45909 Unspecified asthma, uncomplicated: Secondary | ICD-10-CM | POA: Diagnosis not present

## 2017-10-14 DIAGNOSIS — Z79899 Other long term (current) drug therapy: Secondary | ICD-10-CM | POA: Diagnosis not present

## 2017-10-14 DIAGNOSIS — E039 Hypothyroidism, unspecified: Secondary | ICD-10-CM | POA: Diagnosis not present

## 2017-10-14 LAB — COMPREHENSIVE METABOLIC PANEL
ALT: 19 U/L (ref 17–63)
AST: 27 U/L (ref 15–41)
Albumin: 3.2 g/dL — ABNORMAL LOW (ref 3.5–5.0)
Alkaline Phosphatase: 95 U/L (ref 38–126)
Anion gap: 10 (ref 5–15)
BUN: 11 mg/dL (ref 6–20)
CO2: 22 mmol/L (ref 22–32)
Calcium: 8.8 mg/dL — ABNORMAL LOW (ref 8.9–10.3)
Chloride: 104 mmol/L (ref 101–111)
Creatinine, Ser: 0.93 mg/dL (ref 0.61–1.24)
GFR calc Af Amer: >60 mL/min (ref >60)
GFR calc non Af Amer: >60 mL/min (ref >60)
Glucose, Bld: 94 mg/dL (ref 65–99)
Potassium: 4.1 mmol/L (ref 3.5–5.1)
Sodium: 136 mmol/L (ref 135–145)
Total Bilirubin: 1.4 mg/dL — ABNORMAL HIGH (ref 0.3–1.2)
Total Protein: 6.9 g/dL (ref 6.5–8.1)

## 2017-10-14 LAB — RAPID URINE DRUG SCREEN, HOSP PERFORMED
Amphetamines: NOT DETECTED
Barbiturates: NOT DETECTED
Benzodiazepines: NOT DETECTED
Cocaine: POSITIVE — AB
Opiates: NOT DETECTED
Tetrahydrocannabinol: NOT DETECTED

## 2017-10-14 LAB — CBC
HCT: 33.8 % — ABNORMAL LOW (ref 39.0–52.0)
Hemoglobin: 9.7 g/dL — ABNORMAL LOW (ref 13.0–17.0)
MCH: 20.3 pg — ABNORMAL LOW (ref 26.0–34.0)
MCHC: 28.7 g/dL — ABNORMAL LOW (ref 30.0–36.0)
MCV: 70.6 fL — ABNORMAL LOW (ref 78.0–100.0)
Platelets: 377 10*3/uL (ref 150–400)
RBC: 4.79 MIL/uL (ref 4.22–5.81)
RDW: 21.2 % — ABNORMAL HIGH (ref 11.5–15.5)
WBC: 7.1 10*3/uL (ref 4.0–10.5)

## 2017-10-14 LAB — I-STAT TROPONIN, ED: Troponin i, poc: 0 ng/mL (ref 0.00–0.08)

## 2017-10-14 LAB — BRAIN NATRIURETIC PEPTIDE: B Natriuretic Peptide: 987.6 pg/mL — ABNORMAL HIGH (ref 0.0–100.0)

## 2017-10-14 LAB — ETHANOL: Alcohol, Ethyl (B): <10 mg/dL (ref <10)

## 2017-10-14 MED ORDER — FUROSEMIDE 40 MG PO TABS: 40.0000 mg | ORAL_TABLET | Freq: Two times a day (BID) | ORAL | 0 refills | Status: DC

## 2017-10-14 MED ORDER — SPIRONOLACTONE 25 MG PO TABS: 12.5000 mg | ORAL_TABLET | Freq: Two times a day (BID) | ORAL | 0 refills | Status: DC

## 2017-10-14 MED ORDER — LORAZEPAM 1 MG PO TABS
1.0000 mg | ORAL_TABLET | Freq: Once | ORAL | Status: AC
  Administered 2017-10-14: 1 mg via ORAL
  Filled 2017-10-14: qty 1

## 2017-10-14 MED ORDER — SPIRONOLACTONE 12.5 MG HALF TABLET
12.5000 mg | ORAL_TABLET | Freq: Every day | ORAL | Status: DC
  Administered 2017-10-14: 12.5 mg via ORAL
  Filled 2017-10-14: qty 1

## 2017-10-14 MED ORDER — FUROSEMIDE 20 MG PO TABS
40.0000 mg | ORAL_TABLET | Freq: Once | ORAL | Status: AC
  Administered 2017-10-14: 40 mg via ORAL
  Filled 2017-10-14: qty 2

## 2017-10-14 MED ORDER — ONDANSETRON 4 MG PO TBDP
4.0000 mg | ORAL_TABLET | Freq: Once | ORAL | Status: AC
  Administered 2017-10-14: 4 mg via ORAL
  Filled 2017-10-14: qty 1

## 2017-10-14 NOTE — ED Triage Notes (Signed)
Pt arrives via POV from home requesting help with detox from cocaine. States last use was last night. Smokes and snorting cocaine. States last use was last night. Uses 2grams/day. Also states has CHF and no lasix x3 days. Denies SI/HI. States some depression but no plans to harm himself.

## 2017-10-14 NOTE — ED Provider Notes (Signed)
[facility] EMERGENCY DEPARTMENT Provider Note   CSN: [id number] Arrival date & time: 10/14/17  1345     History   Chief Complaint Chief Complaint  Patient presents with  . Medical Clearance    HPI Leonard [name] is a 42 y.o. male.  HPI   41 year old male presenting requesting detox for cocaine.  He says that he uses pretty much daily.  Last used yesterday.  He states that he has not had his medications approximately 3 days.  Denies any acute chest pain or respiratory complaints.  Baseline swelling.  Denies suicidal homicidal ideation.  Past Medical History:  Diagnosis Date  . AKI (acute kidney injury) ([place]) 11/09/2016  . Asthma   . CHF (congestive heart failure) ([place]) 04/05/2016  . Chronic bronchitis ([place])   . Chronic lower back pain   . Cocaine abuse ([place]) 09/03/2017  . COPD (chronic obstructive pulmonary disease) ([place])   . GERD (gastroesophageal reflux disease)   . Headache    "weekly" (09/03/2017)  . History of blood transfusion    "related to CA" (09/03/2017)  . Hypertension   . Hypothyroidism   . Non Hodgkin's lymphoma ([place]) 03/2002   "stage IV"  . Non Hodgkin's lymphoma ([place]) 04/2012   "stage II"  . Pneumonia    "several times" (09/03/2017)  . Sleep apnea    "went to sleep study; didn't have my normal episodes of choking /waking up not able to breathe" (09/03/2017)    Patient Active Problem List   Diagnosis Date Noted  . Acute on chronic HFrEF (heart failure with reduced ejection fraction) ([place]) 09/03/2017  . Cocaine abuse ([place]) 09/03/2017  . Lightheaded 01/22/2017  . AKI (acute kidney injury) ([place]) 11/09/2016  . Abdominal wall abscess at site of surgical wound   . Fever   . Cellulitis 10/22/2016  . Postoperative ileus ([place]) 10/21/2016  . Incarcerated hernia 10/18/2016  . Right lower lobe lung mass 10/18/2016  . Acute on chronic congestive heart failure ([place])   . Incarcerated umbilical hernia   . Asthma 10/06/2016  . History of  substance abuse 10/06/2016  . TSH elevation 10/06/2016  . Bronchitis 10/06/2016  . Chronic pain syndrome 10/06/2016  . History of non-Hodgkin's lymphoma 04/30/2016  . Snoring 04/30/2016  . Chronic combined systolic and diastolic congestive heart failure ([place]) 04/05/2016  . Essential hypertension 10/09/2015    Past Surgical History:  Procedure Laterality Date  . CARDIAC CATHETERIZATION N/A 04/15/2016   Procedure: Right/Left Heart Cath and Coronary Angiography;  Surgeon: Larey Dresser, MD;  Location: [facility];  Service: Cardiovascular;  Laterality: N/A;  . HERNIA REPAIR    . IR RADIOLOGIST EVAL & MGMT  11/19/2016  . IR RADIOLOGIST EVAL & MGMT  11/05/2016  . LAPAROSCOPIC ASSISTED SPIGELIAN HERNIA REPAIR N/A 10/18/2016   Procedure: LAPAROSCOPIC REPAIR OF UMBILICAL HERNIA  WITH MESH AND LYSIS OF ADHESIONS.;  Surgeon: [name], MD;  Location: [facility];  Service: General;  Laterality: N/A;  . tumor biopsy  03/2002   chest  . WISDOM TOOTH EXTRACTION         Home Medications    Prior to Admission medications   Medication Sig Start Date End Date Taking? Authorizing Provider  acetaminophen (TYLENOL) 500 MG tablet Take 1 tablet (500 mg total) by mouth every 6 (six) hours as needed. Patient not taking: Reported on 10/21/2016 01/01/16   Marella Chimes, PA-C  albuterol (PROVENTIL HFA;VENTOLIN HFA) 108 [phone] Base) MCG/ACT inhaler Inhale 2 puffs into the  lungs every 6 (six) hours as needed for wheezing or shortness of breath. 09/04/17   Molt, Bethany, DO  carvedilol (COREG) 6.25 MG tablet Take 1 tablet (6.25 mg total) by mouth 2 (two) times daily with a meal. 09/04/17   Molt, Bethany, DO  fluticasone (FLONASE) 50 MCG/ACT nasal spray Place 2 sprays into both nostrils daily. Patient not taking: Reported on 01/22/2017 10/06/16   Florinda Marker, MD  Fluticasone-Salmeterol (ADVAIR) 250-50 MCG/DOSE AEPB Inhale 2 puffs into the lungs 2 (two) times daily. 09/04/17   Molt, Bethany, DO    furosemide (LASIX) 40 MG tablet Take 1 tablet (40 mg total) by mouth 2 (two) times daily. 09/04/17   Molt, Bethany, DO  potassium chloride SA (K-DUR,KLOR-CON) 20 MEQ tablet Take 1 tablet (20 mEq total) by mouth daily. 09/04/17   Molt, Bethany, DO  sacubitril-valsartan (ENTRESTO) 24-26 MG Take 1 tablet by mouth 2 (two) times daily. Patient not taking: Reported on 08/02/2017 01/22/17   Shirley Friar, PA-C  spironolactone (ALDACTONE) 25 MG tablet Take 0.5 tablets (12.5 mg total) by mouth daily. 09/04/17   Molt, Bethany, DO    Family History Family History  Problem Relation Age of Onset  . Cancer Mother   . Emphysema Mother   . Bronchitis Mother     Social History Social History   Tobacco Use  . Smoking status: Former Smoker    Packs/day: 1.25    Years: 15.00    Pack years: 18.75    Types: Cigarettes    Last attempt to quit: 04/08/2007    Years since quitting: 10.5  . Smokeless tobacco: Current User    Types: Snuff  Substance Use Topics  . Alcohol use: No    Alcohol/week: 0.0 oz  . Drug use: Yes    Types: Cocaine    Comment: 09/03/2017 "none for the last 2 days"     Allergies   Patient has no known allergies.   Review of Systems Review of Systems  All systems reviewed and negative, other than as noted in HPI.  Physical Exam Updated Vital Signs BP (!) 140/96 (BP Location: Right Arm)   Pulse (!) 109   Temp 97.7 F (36.5 C) (Oral)   Resp 18   Ht 6' (1.829 m)   Wt 108.9 kg (240 lb)   SpO2 100%   BMI 32.55 kg/m   Physical Exam  Constitutional: He appears well-developed and well-nourished. No distress.  HENT:  Head: Normocephalic and atraumatic.  Eyes: Conjunctivae are normal. Right eye exhibits no discharge. Left eye exhibits no discharge.  Neck: Neck supple.  Cardiovascular: Normal rate, regular rhythm and normal heart sounds. Exam reveals no gallop and no friction rub.  No murmur heard. Pulmonary/Chest: Effort normal and breath sounds normal. No  respiratory distress.  Abdominal: Soft. He exhibits no distension. There is no tenderness.  Musculoskeletal: He exhibits no edema or tenderness.  Neurological: He is alert.  Skin: Skin is warm and dry.  Psychiatric: He has a normal mood and affect. His behavior is normal. Thought content normal.  Nursing note and vitals reviewed.    ED Treatments / Results  Labs (all labs ordered are listed, but only abnormal results are displayed) Labs Reviewed  COMPREHENSIVE METABOLIC PANEL - Abnormal; Notable for the following components:      Result Value   Calcium 8.8 (*)    Albumin 3.2 (*)    Total Bilirubin 1.4 (*)    All other components within normal limits  CBC - Abnormal;  Notable for the following components:   Hemoglobin 9.7 (*)    HCT 33.8 (*)    MCV 70.6 (*)    MCH 20.3 (*)    MCHC 28.7 (*)    RDW 21.2 (*)    All other components within normal limits  BRAIN NATRIURETIC PEPTIDE - Abnormal; Notable for the following components:   B Natriuretic Peptide 987.6 (*)    All other components within normal limits  ETHANOL  RAPID URINE DRUG SCREEN, HOSP PERFORMED  I-STAT TROPONIN, ED    EKG  EKG Interpretation  Date/Time:  Thursday October 14 2017 14:20:09 EST Ventricular Rate:  98 PR Interval:  152 QRS Duration: 96 QT Interval:  418 QTC Calculation: 533 R Axis:   92 Text Interpretation:  Normal sinus rhythm Rightward axis Prolonged QT Abnormal ECG Confirmed by Virgel Manifold [phone]) on 10/14/2017 7:23:08 PM       Radiology Dg Chest 2 View  Result Date: 10/14/2017 CLINICAL DATA:  Chest pain, productive cough, and shortness of breath for 1 week. EXAM: CHEST  2 VIEW COMPARISON:  08/02/2017 FINDINGS: Cardiomediastinal silhouette is unchanged. The cardiac silhouette is accentuated by AP technique. Chronic left upper lobe collapse is unchanged. Blunting of the right costophrenic angle is unchanged and may reflect pleural thickening or a tiny effusion. No acute airspace  consolidation, edema, or pneumothorax is identified. No acute osseous abnormality is seen. IMPRESSION: Chronic changes as above without evidence of acute abnormality. Electronically Signed   By: Logan Bores M.D.   On: 10/14/2017 15:08    Procedures Procedures (including critical care time)  Medications Ordered in ED Medications - No data to display   Initial Impression / Assessment and Plan / ED Course  I have reviewed the triage vital signs and the nursing notes.  Pertinent labs & imaging results that were available during my care of the patient were reviewed by me and considered in my medical decision making (see chart for details).     41yM with cocaine abuse.  Explained to patient that we do not do detox for this.  He was given outpatient resources.  He is got a significant cardiac history but he has no acute acute respiratory complaints or chest pain.  Chest x-ray without overt edema.  No increased work of breathing.  He is provided a prescription for his Aldactone and Lasix.  Outpatient follow-up as needed for this.  Final Clinical Impressions(s) / ED Diagnoses   Final diagnoses:  Cocaine abuse [facility])    ED Discharge Orders    None       Virgel Manifold, MD 10/14/17 2346

## 2017-10-14 NOTE — ED Notes (Signed)
Pt called for VS. No answer.

## 2017-11-03 ENCOUNTER — Emergency Department (HOSPITAL_COMMUNITY)
Admission: EM | Admit: 2017-11-03 | Discharge: 2017-11-03 | Discharge status: Against Medical Advice | Payer: Medicaid Other

## 2017-11-03 NOTE — ED Notes (Signed)
Called patient to go to be triage and no answer

## 2017-11-03 NOTE — ED Triage Notes (Signed)
CALLED PT FOR TRIAGE X1 NO RESPONSE 

## 2017-11-03 NOTE — ED Notes (Signed)
Called patient to do triage and no answer.

## 2017-11-04 ENCOUNTER — Emergency Department (HOSPITAL_COMMUNITY)
Admission: EM | Admit: 2017-11-04 | Discharge: 2017-11-04 | Discharge status: Against Medical Advice | Payer: Medicaid Other

## 2017-11-04 ENCOUNTER — Encounter (HOSPITAL_COMMUNITY): Payer: Self-pay | Admitting: Emergency Medicine

## 2017-11-04 ENCOUNTER — Other Ambulatory Visit: Payer: Self-pay

## 2017-11-04 DIAGNOSIS — F1410 Cocaine abuse, uncomplicated: Secondary | ICD-10-CM | POA: Diagnosis not present

## 2017-11-04 DIAGNOSIS — Z8572 Personal history of non-Hodgkin lymphomas: Secondary | ICD-10-CM | POA: Insufficient documentation

## 2017-11-04 DIAGNOSIS — Z6833 Body mass index (BMI) 33.0-33.9, adult: Secondary | ICD-10-CM | POA: Diagnosis not present

## 2017-11-04 DIAGNOSIS — Z79899 Other long term (current) drug therapy: Secondary | ICD-10-CM | POA: Diagnosis not present

## 2017-11-04 DIAGNOSIS — J449 Chronic obstructive pulmonary disease, unspecified: Secondary | ICD-10-CM | POA: Insufficient documentation

## 2017-11-04 DIAGNOSIS — R609 Edema, unspecified: Secondary | ICD-10-CM | POA: Diagnosis present

## 2017-11-04 DIAGNOSIS — I5023 Acute on chronic systolic (congestive) heart failure: Secondary | ICD-10-CM | POA: Diagnosis not present

## 2017-11-04 DIAGNOSIS — E669 Obesity, unspecified: Secondary | ICD-10-CM | POA: Diagnosis not present

## 2017-11-04 DIAGNOSIS — F17290 Nicotine dependence, other tobacco product, uncomplicated: Secondary | ICD-10-CM | POA: Diagnosis not present

## 2017-11-04 NOTE — ED Triage Notes (Signed)
Patient here from home with complaints of abdominal and bilateral leg swelling. Hx of heart failure. Reports "I am really here to get help with detox from cocaine".

## 2017-11-04 NOTE — ED Notes (Signed)
Called Pt in lobby for vital recheck, no response x1. 

## 2017-11-04 NOTE — ED Notes (Signed)
Called Pt in lobby no response x2. 

## 2017-11-04 NOTE — ED Notes (Signed)
NO ANSWER WHEN CALLED 

## 2017-11-04 NOTE — ED Triage Notes (Signed)
Pt states he is retaining fluid in his lower extremities and in his right flank area  Right flank is red and hard to touch  Pt states he also is needing detox from cocaine  Last used yesterday

## 2017-11-04 NOTE — ED Notes (Signed)
Bed: WTR5 Expected date:  Expected time:  Means of arrival:  Comments: 

## 2017-11-05 ENCOUNTER — Emergency Department (HOSPITAL_COMMUNITY)
Admission: EM | Admit: 2017-11-05 | Discharge: 2017-11-05 | Disposition: A | Discharge status: Home / Self Care | Payer: Medicaid Other | Attending: Emergency Medicine | Admitting: Emergency Medicine

## 2017-11-05 ENCOUNTER — Emergency Department (HOSPITAL_COMMUNITY): Payer: Medicaid Other

## 2017-11-05 DIAGNOSIS — F1410 Cocaine abuse, uncomplicated: Secondary | ICD-10-CM

## 2017-11-05 DIAGNOSIS — I5023 Acute on chronic systolic (congestive) heart failure: Secondary | ICD-10-CM

## 2017-11-05 LAB — CBC WITH DIFFERENTIAL/PLATELET
BASOS ABS: 0.1 10*3/uL (ref 0.0–0.1)
BASOS PCT: 1 %
Eosinophils Absolute: 0.1 10*3/uL (ref 0.0–0.7)
Eosinophils Relative: 1 %
HCT: 34.4 % — ABNORMAL LOW (ref 39.0–52.0)
Hemoglobin: 9.8 g/dL — ABNORMAL LOW (ref 13.0–17.0)
LYMPHS ABS: 1.3 10*3/uL (ref 0.7–4.0)
LYMPHS PCT: 22 %
MCH: 20.6 pg — AB (ref 26.0–34.0)
MCHC: 28.5 g/dL — AB (ref 30.0–36.0)
MCV: 72.4 fL — ABNORMAL LOW (ref 78.0–100.0)
MONO ABS: 0.6 10*3/uL (ref 0.1–1.0)
Monocytes Relative: 10 %
NEUTROS ABS: 3.8 10*3/uL (ref 1.7–7.7)
Neutrophils Relative %: 66 %
PLATELETS: 354 10*3/uL (ref 150–400)
RBC: 4.75 MIL/uL (ref 4.22–5.81)
RDW: 21.3 % — AB (ref 11.5–15.5)
WBC: 5.9 10*3/uL (ref 4.0–10.5)

## 2017-11-05 LAB — COMPREHENSIVE METABOLIC PANEL
ALBUMIN: 3.2 g/dL — AB (ref 3.5–5.0)
ALT: 20 U/L (ref 17–63)
ANION GAP: 10 (ref 5–15)
AST: 30 U/L (ref 15–41)
Alkaline Phosphatase: 113 U/L (ref 38–126)
BUN: 11 mg/dL (ref 6–20)
CALCIUM: 8.6 mg/dL — AB (ref 8.9–10.3)
CO2: 27 mmol/L (ref 22–32)
Chloride: 104 mmol/L (ref 101–111)
Creatinine, Ser: 1 mg/dL (ref 0.61–1.24)
GFR calc non Af Amer: >60 mL/min (ref >60)
GLUCOSE: 104 mg/dL — AB (ref 65–99)
POTASSIUM: 3.3 mmol/L — AB (ref 3.5–5.1)
SODIUM: 141 mmol/L (ref 135–145)
TOTAL PROTEIN: 7.4 g/dL (ref 6.5–8.1)
Total Bilirubin: 0.7 mg/dL (ref 0.3–1.2)

## 2017-11-05 LAB — I-STAT TROPONIN, ED: Troponin i, poc: 0 ng/mL (ref 0.00–0.08)

## 2017-11-05 LAB — RAPID URINE DRUG SCREEN, HOSP PERFORMED
AMPHETAMINES: NOT DETECTED
BENZODIAZEPINES: NOT DETECTED
Barbiturates: NOT DETECTED
Cocaine: POSITIVE — AB
OPIATES: NOT DETECTED
Tetrahydrocannabinol: NOT DETECTED

## 2017-11-05 LAB — BRAIN NATRIURETIC PEPTIDE: B Natriuretic Peptide: 649.3 pg/mL — ABNORMAL HIGH (ref 0.0–100.0)

## 2017-11-05 MED ORDER — FUROSEMIDE 10 MG/ML IJ SOLN
80.0000 mg | Freq: Once | INTRAMUSCULAR | Status: AC
  Administered 2017-11-05: 80 mg via INTRAVENOUS
  Filled 2017-11-05: qty 8

## 2017-11-05 MED ORDER — POTASSIUM CHLORIDE CRYS ER 20 MEQ PO TBCR: 20.0000 meq | EXTENDED_RELEASE_TABLET | Freq: Every day | ORAL | 0 refills | Status: DC

## 2017-11-05 MED ORDER — POTASSIUM CHLORIDE CRYS ER 20 MEQ PO TBCR
40.0000 meq | EXTENDED_RELEASE_TABLET | Freq: Once | ORAL | Status: AC
  Administered 2017-11-05: 40 meq via ORAL
  Filled 2017-11-05: qty 2

## 2017-11-05 MED ORDER — FUROSEMIDE 40 MG PO TABS: 40.0000 mg | ORAL_TABLET | Freq: Two times a day (BID) | ORAL | 0 refills | Status: DC

## 2017-11-05 NOTE — ED Provider Notes (Signed)
[facility] DEPT Provider Note   CSN: [id number] Arrival date & time: 11/04/17  1629     History   Chief Complaint Chief Complaint  Patient presents with  . fluid retention  . detox    HPI Leonard [name] is a 42 y.o. male.  HPI   42 year old male with significant history of CHF currently on Lasix, history of cocaine abuse, COPD, hypertension presenting for evaluation of increased fluid retention.  Patient admits that he is addicted to cocaine.  He has been increasing his cocaine use on a daily basis and quantify as 2-3 g of cocaine per day last use was today.  He wants to be detoxed from this.  He also admits to having CHF and is having increasing fluid gain.  States he gained approximately 30 pounds within the past 2 weeks.  He endorsed increased shortness of breath with exertion and with lying flat.  States that the swelling has now traveled up to his abdomen.  Endorsed recurrent chest pain shortness of breath.  Past Medical History:  Diagnosis Date  . AKI (acute kidney injury) ([place]) 11/09/2016  . Asthma   . CHF (congestive heart failure) ([place]) 04/05/2016  . Chronic bronchitis ([place])   . Chronic lower back pain   . Cocaine abuse ([place]) 09/03/2017  . COPD (chronic obstructive pulmonary disease) ([place])   . GERD (gastroesophageal reflux disease)   . Headache    "weekly" (09/03/2017)  . History of blood transfusion    "related to CA" (09/03/2017)  . Hypertension   . Hypothyroidism   . Non Hodgkin's lymphoma ([place]) 03/2002   "stage IV"  . Non Hodgkin's lymphoma ([place]) 04/2012   "stage II"  . Pneumonia    "several times" (09/03/2017)  . Sleep apnea    "went to sleep study; didn't have my normal episodes of choking /waking up not able to breathe" (09/03/2017)    Patient Active Problem List   Diagnosis Date Noted  . Acute on chronic HFrEF (heart failure with reduced ejection fraction) ([place]) 09/03/2017  . Cocaine abuse ([place]) 09/03/2017  . Lightheaded  01/22/2017  . AKI (acute kidney injury) ([place]) 11/09/2016  . Abdominal wall abscess at site of surgical wound   . Fever   . Cellulitis 10/22/2016  . Postoperative ileus ([place]) 10/21/2016  . Incarcerated hernia 10/18/2016  . Right lower lobe lung mass 10/18/2016  . Acute on chronic congestive heart failure ([place])   . Incarcerated umbilical hernia   . Asthma 10/06/2016  . History of substance abuse 10/06/2016  . TSH elevation 10/06/2016  . Bronchitis 10/06/2016  . Chronic pain syndrome 10/06/2016  . History of non-Hodgkin's lymphoma 04/30/2016  . Snoring 04/30/2016  . Chronic combined systolic and diastolic congestive heart failure ([place]) 04/05/2016  . Essential hypertension 10/09/2015    Past Surgical History:  Procedure Laterality Date  . CARDIAC CATHETERIZATION N/A 04/15/2016   Procedure: Right/Left Heart Cath and Coronary Angiography;  Surgeon: Larey Dresser, MD;  Location: [facility];  Service: Cardiovascular;  Laterality: N/A;  . HERNIA REPAIR    . IR RADIOLOGIST EVAL & MGMT  11/19/2016  . IR RADIOLOGIST EVAL & MGMT  11/05/2016  . LAPAROSCOPIC ASSISTED SPIGELIAN HERNIA REPAIR N/A 10/18/2016   Procedure: LAPAROSCOPIC REPAIR OF UMBILICAL HERNIA  WITH MESH AND LYSIS OF ADHESIONS.;  Surgeon: [name], MD;  Location: [facility];  Service: General;  Laterality: N/A;  . tumor biopsy  03/2002   chest  . WISDOM TOOTH EXTRACTION  Home Medications    Prior to Admission medications   Medication Sig Start Date End Date Taking? Authorizing Provider  acetaminophen (TYLENOL) 500 MG tablet Take 1 tablet (500 mg total) by mouth every 6 (six) hours as needed. Patient not taking: Reported on 10/21/2016 01/01/16   Marella Chimes, PA-C  albuterol (PROVENTIL HFA;VENTOLIN HFA) 108 [phone] Base) MCG/ACT inhaler Inhale 2 puffs into the lungs every 6 (six) hours as needed for wheezing or shortness of breath. 09/04/17   Molt, Bethany, DO  carvedilol (COREG) 6.25 MG tablet Take 1  tablet (6.25 mg total) by mouth 2 (two) times daily with a meal. 09/04/17   Molt, Bethany, DO  fluticasone (FLONASE) 50 MCG/ACT nasal spray Place 2 sprays into both nostrils daily. Patient not taking: Reported on 01/22/2017 10/06/16   Florinda Marker, MD  Fluticasone-Salmeterol (ADVAIR) 250-50 MCG/DOSE AEPB Inhale 2 puffs into the lungs 2 (two) times daily. 09/04/17   Molt, Bethany, DO  furosemide (LASIX) 40 MG tablet Take 1 tablet (40 mg total) by mouth 2 (two) times daily. 09/04/17   Molt, Bethany, DO  furosemide (LASIX) 40 MG tablet Take 1 tablet (40 mg total) by mouth 2 (two) times daily. 10/14/17   Virgel Manifold, MD  potassium chloride SA (K-DUR,KLOR-CON) 20 MEQ tablet Take 1 tablet (20 mEq total) by mouth daily. 09/04/17   Molt, Bethany, DO  sacubitril-valsartan (ENTRESTO) 24-26 MG Take 1 tablet by mouth 2 (two) times daily. Patient not taking: Reported on 08/02/2017 01/22/17   Shirley Friar, PA-C  spironolactone (ALDACTONE) 25 MG tablet Take 0.5 tablets (12.5 mg total) by mouth daily. 09/04/17   Molt, Bethany, DO  spironolactone (ALDACTONE) 25 MG tablet Take 0.5 tablets (12.5 mg total) by mouth 2 (two) times daily. 10/14/17   Virgel Manifold, MD    Family History Family History  Problem Relation Age of Onset  . Cancer Mother   . Emphysema Mother   . Bronchitis Mother     Social History Social History   Tobacco Use  . Smoking status: Former Smoker    Packs/day: 1.25    Years: 15.00    Pack years: 18.75    Types: Cigarettes    Last attempt to quit: 04/08/2007    Years since quitting: 10.5  . Smokeless tobacco: Current User    Types: Snuff  Substance Use Topics  . Alcohol use: No    Alcohol/week: 0.0 oz  . Drug use: Yes    Types: Cocaine    Comment: last used yesterday      Allergies   Patient has no known allergies.   Review of Systems Review of Systems  All other systems reviewed and are negative.    Physical Exam Updated Vital Signs BP (!) 164/118 (BP  Location: Right Arm)   Pulse (!) 119   Temp 97.9 F (36.6 C) (Oral)   Resp 20   Ht 6' (1.829 m)   Wt 111.1 kg (245 lb)   SpO2 98%   BMI 33.23 kg/m   Physical Exam  Constitutional: He is oriented to person, place, and time. He appears well-developed and well-nourished. No distress.  HENT:  Head: Atraumatic.  Eyes: Conjunctivae are normal.  Neck: Neck supple. No JVD present.  Cardiovascular:  Tachycardia with faint murmur heard.  Pulmonary/Chest:  Faint crackles heard at lung bases  Abdominal: Soft.  Abdomen is mildly distended at the pannus nontender  Musculoskeletal: He exhibits edema (2+ pitting edema to bilateral lower extremities with intact distal pedal pulses.).  Neurological: He is alert and oriented to person, place, and time.  Skin: No rash noted.  Psychiatric: He has a normal mood and affect.  Nursing note and vitals reviewed.    ED Treatments / Results  Labs (all labs ordered are listed, but only abnormal results are displayed) Labs Reviewed  CBC WITH DIFFERENTIAL/PLATELET - Abnormal; Notable for the following components:      Result Value   Hemoglobin 9.8 (*)    HCT 34.4 (*)    MCV 72.4 (*)    MCH 20.6 (*)    MCHC 28.5 (*)    RDW 21.3 (*)    All other components within normal limits  COMPREHENSIVE METABOLIC PANEL - Abnormal; Notable for the following components:   Potassium 3.3 (*)    Glucose, Bld 104 (*)    Calcium 8.6 (*)    Albumin 3.2 (*)    All other components within normal limits  BRAIN NATRIURETIC PEPTIDE - Abnormal; Notable for the following components:   B Natriuretic Peptide 649.3 (*)    All other components within normal limits  RAPID URINE DRUG SCREEN, HOSP PERFORMED - Abnormal; Notable for the following components:   Cocaine POSITIVE (*)    All other components within normal limits  I-STAT TROPONIN, ED    EKG  EKG Interpretation  Date/Time:  Friday November 05 2017 02:50:41 EDT Ventricular Rate:  114 PR Interval:    QRS  Duration: 104 QT Interval:  377 QTC Calculation: 520 R Axis:   75 Text Interpretation:  Sinus tachycardia Probable left ventricular hypertrophy Prolonged QT interval No significant change since last tracing Confirmed by Pryor Curia [phone]) on 11/05/2017 3:17:55 AM       Radiology Dg Chest 2 View  Result Date: 11/05/2017 CLINICAL DATA:  Acute onset of shortness of breath. Bilateral lower extremity swelling. EXAM: CHEST - 2 VIEW COMPARISON:  Chest radiograph performed 10/14/2017 FINDINGS: The lungs are well-aerated. Mild bibasilar opacities may reflect mild interstitial edema. There is no evidence of pleural effusion or pneumothorax. The heart is borderline normal in size. No acute osseous abnormalities are seen. IMPRESSION: Mild bibasilar airspace opacities may reflect mild interstitial edema. Electronically Signed   By: Garald Balding M.D.   On: 11/05/2017 02:29    Procedures Procedures (including critical care time)  Medications Ordered in ED Medications  potassium chloride SA (K-DUR,KLOR-CON) CR tablet 40 mEq (not administered)  furosemide (LASIX) injection 80 mg (80 mg Intravenous Given 11/05/17 0245)     Initial Impression / Assessment and Plan / ED Course  I have reviewed the triage vital signs and the nursing notes.  Pertinent labs & imaging results that were available during my care of the patient were reviewed by me and considered in my medical decision making (see chart for details).     BP (!) 141/105   Pulse (!) 114   Temp 97.9 F (36.6 C) (Oral)   Resp (!) 25   Ht 6' (1.829 m)   Wt 111.1 kg (245 lb)   SpO2 96%   BMI 33.23 kg/m    Final Clinical Impressions(s) / ED Diagnoses   Final diagnoses:  Acute on chronic systolic congestive heart failure (HCC)  Cocaine abuse [facility])    ED Discharge Orders        Ordered    potassium chloride SA (K-DUR,KLOR-CON) 20 MEQ tablet  Daily     11/05/17 0608    furosemide (LASIX) 40 MG tablet  2 times daily     11/05/17  6283     2:04 AM Patient with significant history of cocaine use, last used today requesting for detox.  Furthermore, history of CHF and report having increased fluid gain approximately 30 pounds within the past 2 weeks.  He does appears to be fluid overloaded without JVD.  He reports been compliant with his Lasix.  At this time, he will benefit from IV Lasix.  Labs ordered.  Workup initiated.  5:47 AM Patient tested positive use.  Mildly hypokalemic with a potassium of 3.3  Mildly anemic with hemoglobin of 9.8, similar to baseline.  Today BNP is 649, improved from prior.  Chest x-ray showing mild bibasilar airspace opacity may reflect mild interstitial edema.  Patient is not hypoxic.  Patient is mildly tachycardic however, I have low suspicion for PE causing his symptoms.  I discussed the latter finding with patient.  I encourage patient to take Lasix 80 mg twice daily for the next 3 days and then resume taking Lasix 40 mg twice daily and follow-up with his PCP for recheck.  I also provide outpatient resources for polysubstance abuse and detox of cocaine.  Return precautions discussed.  Patient agrees with plan   Domenic Moras, PA-C 11/05/17 0616    Ward, Delice Bison, DO 11/05/17 6629

## 2017-11-05 NOTE — Discharge Instructions (Signed)
You have been evaluated for your condition.  For the next 3 days please take 80mg  of Lasix twice daily and then return to taking 40mg  of Lasix twice daily.  Take potassium supplementation as prescribed.  Use resources below to seek outpatient help with cocaine detox.  Return if you have any concerns

## 2017-11-09 ENCOUNTER — Other Ambulatory Visit (HOSPITAL_COMMUNITY): Payer: Self-pay

## 2017-11-09 MED ORDER — POTASSIUM CHLORIDE CRYS ER 20 MEQ PO TBCR: 20.0000 meq | EXTENDED_RELEASE_TABLET | Freq: Every day | ORAL | 0 refills | Status: DC

## 2017-11-09 MED ORDER — FUROSEMIDE 40 MG PO TABS: 40.0000 mg | ORAL_TABLET | Freq: Two times a day (BID) | ORAL | 0 refills | Status: DC

## 2017-11-11 ENCOUNTER — Other Ambulatory Visit (HOSPITAL_COMMUNITY): Payer: Self-pay | Admitting: *Deleted

## 2017-11-11 MED ORDER — OMEPRAZOLE 20 MG PO CPDR: 20.0000 mg | DELAYED_RELEASE_CAPSULE | Freq: Every day | ORAL | 3 refills | Status: DC

## 2017-11-11 MED ORDER — SACUBITRIL-VALSARTAN 49-51 MG PO TABS: 1.0000 | ORAL_TABLET | Freq: Two times a day (BID) | ORAL | 3 refills | Status: DC

## 2017-11-22 ENCOUNTER — Telehealth (HOSPITAL_COMMUNITY): Payer: Self-pay | Admitting: Pharmacist

## 2017-11-22 NOTE — Telephone Encounter (Signed)
Entresto PA approved by [place] [employer] through 11/06/18.   Ruta Hinds. Velva Harman, PharmD, BCPS, CPP Clinical Pharmacist Phone: [phone] 11/22/2017 12:31 PM

## 2017-12-07 ENCOUNTER — Telehealth (HOSPITAL_COMMUNITY): Payer: Self-pay

## 2017-12-07 NOTE — Telephone Encounter (Signed)
Patient left VM on CHF clinic triage requesting records to be sent to [facility] where he is currently admitted. Attempted to return call, went straight to VM.  Left return VM asking to return call to [place] to give [place] the fax number to send record to.  Also gave our fax number if there is certain requested information.  Renee Pain, RN

## 2018-11-08 ENCOUNTER — Emergency Department (HOSPITAL_BASED_OUTPATIENT_CLINIC_OR_DEPARTMENT_OTHER)
Admission: EM | Admit: 2018-11-08 | Discharge: 2018-11-08 | Disposition: A | Discharge status: Home / Self Care | Payer: Medicaid Other | Attending: Emergency Medicine | Admitting: Emergency Medicine

## 2018-11-08 ENCOUNTER — Encounter (HOSPITAL_BASED_OUTPATIENT_CLINIC_OR_DEPARTMENT_OTHER): Payer: Self-pay | Admitting: Emergency Medicine

## 2018-11-08 ENCOUNTER — Other Ambulatory Visit: Payer: Self-pay

## 2018-11-08 DIAGNOSIS — K0381 Cracked tooth: Secondary | ICD-10-CM | POA: Diagnosis not present

## 2018-11-08 DIAGNOSIS — I5042 Chronic combined systolic (congestive) and diastolic (congestive) heart failure: Secondary | ICD-10-CM | POA: Diagnosis not present

## 2018-11-08 DIAGNOSIS — Z8572 Personal history of non-Hodgkin lymphomas: Secondary | ICD-10-CM | POA: Diagnosis not present

## 2018-11-08 DIAGNOSIS — Z79899 Other long term (current) drug therapy: Secondary | ICD-10-CM | POA: Diagnosis not present

## 2018-11-08 DIAGNOSIS — I110 Hypertensive heart disease with heart failure: Secondary | ICD-10-CM | POA: Diagnosis not present

## 2018-11-08 DIAGNOSIS — Z87891 Personal history of nicotine dependence: Secondary | ICD-10-CM | POA: Insufficient documentation

## 2018-11-08 DIAGNOSIS — J45909 Unspecified asthma, uncomplicated: Secondary | ICD-10-CM | POA: Diagnosis not present

## 2018-11-08 DIAGNOSIS — E039 Hypothyroidism, unspecified: Secondary | ICD-10-CM | POA: Diagnosis not present

## 2018-11-08 DIAGNOSIS — K0889 Other specified disorders of teeth and supporting structures: Secondary | ICD-10-CM | POA: Insufficient documentation

## 2018-11-08 MED ORDER — PENICILLIN V POTASSIUM 500 MG PO TABS: 500.0000 mg | ORAL_TABLET | Freq: Four times a day (QID) | ORAL | 0 refills | Status: DC

## 2018-11-08 MED ORDER — IBUPROFEN 600 MG PO TABS: 600.0000 mg | ORAL_TABLET | Freq: Two times a day (BID) | ORAL | 0 refills | Status: AC

## 2018-11-08 MED ORDER — IBUPROFEN 600 MG PO TABS: 600.0000 mg | ORAL_TABLET | Freq: Two times a day (BID) | ORAL | 0 refills | Status: DC

## 2018-11-08 MED ORDER — PENICILLIN V POTASSIUM 500 MG PO TABS: 500.0000 mg | ORAL_TABLET | Freq: Four times a day (QID) | ORAL | 0 refills | Status: AC

## 2018-11-08 NOTE — Discharge Instructions (Addendum)
I have prescribed medication to help with your pain, 600 mg ibuprofen this has been sent to the pharmacy that you requested.  I have also provided antibiotic to prophylactically treat your dental infection, you need to ultimately see a dentist for further evaluation and management of your dental complaints.  Please return to the emergency room if there is pain with eye movement, facial swelling, fevers or worsening symptoms.

## 2018-11-08 NOTE — ED Triage Notes (Addendum)
Reports left upper and right lower dental pain x 2 days.

## 2018-11-08 NOTE — ED Notes (Signed)
Pt had gabapentin and ibuprofen PTA.

## 2018-11-08 NOTE — ED Provider Notes (Signed)
[facility] EMERGENCY DEPARTMENT Provider Note   CSN: [id number] Arrival date & time: 11/08/18  1622    History   Chief Complaint Chief Complaint  Patient presents with  . Dental Pain    HPI Leonard [name] is a 43 y.o. male.     43 y.o male with a PMH of CHF, AKI,GERD, Cocaine abuse, HTN presents to the ED with a chief complaint of dental pain x3 days.  Patient reports pain along the lower part of his gum line, along with upper back gum line. He reports pain is sharp with drinking, eating, speaking. Symptom are exacerbated with cold drinks and cold air. He has taken ibuprofen for his symptoms but reports no improvement.  He denies seeing a dentist the past few months, denies any fevers, difficulty swallowing, pain with eye movement.     Past Medical History:  Diagnosis Date  . AKI (acute kidney injury) ([place]) 11/09/2016  . Asthma   . CHF (congestive heart failure) ([place]) 04/05/2016  . Chronic bronchitis ([place])   . Chronic lower back pain   . Cocaine abuse ([place]) 09/03/2017  . COPD (chronic obstructive pulmonary disease) ([place])   . GERD (gastroesophageal reflux disease)   . Headache    "weekly" (09/03/2017)  . History of blood transfusion    "related to CA" (09/03/2017)  . Hypertension   . Hypothyroidism   . Non Hodgkin's lymphoma ([place]) 03/2002   "stage IV"  . Non Hodgkin's lymphoma ([place]) 04/2012   "stage II"  . Pneumonia    "several times" (09/03/2017)  . Sleep apnea    "went to sleep study; didn't have my normal episodes of choking /waking up not able to breathe" (09/03/2017)    Patient Active Problem List   Diagnosis Date Noted  . Acute on chronic HFrEF (heart failure with reduced ejection fraction) ([place]) 09/03/2017  . Cocaine abuse ([place]) 09/03/2017  . Lightheaded 01/22/2017  . AKI (acute kidney injury) ([place]) 11/09/2016  . Abdominal wall abscess at site of surgical wound   . Fever   . Cellulitis 10/22/2016  . Postoperative ileus ([place]) 10/21/2016  .  Incarcerated hernia 10/18/2016  . Right lower lobe lung mass 10/18/2016  . Acute on chronic congestive heart failure ([place])   . Incarcerated umbilical hernia   . Asthma 10/06/2016  . History of substance abuse ([place]) 10/06/2016  . TSH elevation 10/06/2016  . Bronchitis 10/06/2016  . Chronic pain syndrome 10/06/2016  . History of non-Hodgkin's lymphoma 04/30/2016  . Snoring 04/30/2016  . Chronic combined systolic and diastolic congestive heart failure ([place]) 04/05/2016  . Essential hypertension 10/09/2015    Past Surgical History:  Procedure Laterality Date  . CARDIAC CATHETERIZATION N/A 04/15/2016   Procedure: Right/Left Heart Cath and Coronary Angiography;  Surgeon: Larey Dresser, MD;  Location: [facility];  Service: Cardiovascular;  Laterality: N/A;  . HERNIA REPAIR    . IR RADIOLOGIST EVAL & MGMT  11/19/2016  . IR RADIOLOGIST EVAL & MGMT  11/05/2016  . LAPAROSCOPIC ASSISTED SPIGELIAN HERNIA REPAIR N/A 10/18/2016   Procedure: LAPAROSCOPIC REPAIR OF UMBILICAL HERNIA  WITH MESH AND LYSIS OF ADHESIONS.;  Surgeon: [name], MD;  Location: [facility];  Service: General;  Laterality: N/A;  . tumor biopsy  03/2002   chest  . WISDOM TOOTH EXTRACTION          Home Medications    Prior to Admission medications   Medication Sig Start Date End Date Taking? Authorizing Provider  furosemide (LASIX) 40  MG tablet Take 1 tablet (40 mg total) by mouth 2 (two) times daily. 11/09/17   Larey Dresser, MD  ibuprofen (ADVIL,MOTRIN) 600 MG tablet Take 1 tablet (600 mg total) by mouth 2 (two) times daily for 7 days. 11/08/18 11/15/18  Janeece Fitting, PA-C  omeprazole (PRILOSEC) 20 MG capsule Take 1 capsule (20 mg total) by mouth daily. 11/11/17   Shirley Friar, PA-C  penicillin v potassium (VEETID) 500 MG tablet Take 1 tablet (500 mg total) by mouth 4 (four) times daily for 7 days. 11/08/18 11/15/18  Janeece Fitting, PA-C  potassium chloride SA (K-DUR,KLOR-CON) 20 MEQ tablet Take 1 tablet  (20 mEq total) by mouth daily. 11/09/17   Larey Dresser, MD  sacubitril-valsartan (ENTRESTO) 49-51 MG Take 1 tablet by mouth 2 (two) times daily. 11/11/17   Shirley Friar, PA-C    Family History Family History  Problem Relation Age of Onset  . Cancer Mother   . Emphysema Mother   . Bronchitis Mother     Social History Social History   Tobacco Use  . Smoking status: Former Smoker    Packs/day: 1.25    Years: 15.00    Pack years: 18.75    Types: Cigarettes    Last attempt to quit: 04/08/2007    Years since quitting: 11.5  . Smokeless tobacco: Current User    Types: Snuff  Substance Use Topics  . Alcohol use: No    Alcohol/week: 0.0 standard drinks  . Drug use: Yes    Types: Cocaine    Comment: last used yesterday      Allergies   Patient has no known allergies.   Review of Systems Review of Systems  Constitutional: Negative for fever.  HENT: Positive for dental problem.      Physical Exam Updated Vital Signs BP (!) 150/98 (BP Location: Left Arm)   Pulse (!) 103   Temp 97.6 F (36.4 C) (Oral)   Resp 16   Ht 6' (1.829 m)   Wt 108.9 kg   SpO2 99%   BMI 32.55 kg/m   Physical Exam Vitals signs and nursing note reviewed.  Constitutional:      Appearance: He is well-developed.  HENT:     Head: Normocephalic and atraumatic.     Mouth/Throat:     Mouth: Mucous membranes are moist.     Dentition: Abnormal dentition. Dental abscesses present.     Pharynx: Oropharynx is clear. No pharyngeal swelling.      Comments: Poor dentition throughout his whole mouth. Eyes:     General: No scleral icterus.    Pupils: Pupils are equal, round, and reactive to light.  Neck:     Musculoskeletal: Normal range of motion.  Cardiovascular:     Heart sounds: Normal heart sounds.  Pulmonary:     Effort: Pulmonary effort is normal.     Breath sounds: Normal breath sounds. No wheezing.  Chest:     Chest wall: No tenderness.  Abdominal:     General: Bowel sounds  are normal. There is no distension.     Palpations: Abdomen is soft.     Tenderness: There is no abdominal tenderness.  Musculoskeletal:        General: No tenderness or deformity.  Skin:    General: Skin is warm and dry.  Neurological:     Mental Status: He is alert and oriented to person, place, and time.      ED Treatments / Results  Labs (all labs ordered  are listed, but only abnormal results are displayed) Labs Reviewed - No data to display  EKG None  Radiology No results found.  Procedures Procedures (including critical care time)  Medications Ordered in ED Medications - No data to display   Initial Impression / Assessment and Plan / ED Course  I have reviewed the triage vital signs and the nursing notes.  Pertinent labs & imaging results that were available during my care of the patient were reviewed by me and considered in my medical decision making (see chart for details).   Patient with extensive past medical history presents to the ED with dental pain x3 days, has tried over-the-counter therapy with no relieving symptoms.  Reports last seen a dentist several years ago.  During evaluation there is poor dentition throughout his whole mouth, missing teeth on top and bottom gumline.  1 of the tooth that he reports pain and is currently chipped, will treat with antibiotics prophylactically to help with his infection, he is advised to see a dentist within next few weeks.  Provided with dental resources which he needs to schedule an appointment.  Patient is chosen agrees with management, ibuprofen has been given her worst pain as [facility] is [facility].  Return precautions provided.    Final Clinical Impressions(s) / ED Diagnoses   Final diagnoses:  Pain, dental    ED Discharge Orders         Ordered    ibuprofen (ADVIL,MOTRIN) 600 MG tablet  2 times daily,   Status:  Discontinued     11/08/18 1720    penicillin v potassium (VEETID) 500 MG  tablet  4 times daily,   Status:  Discontinued     11/08/18 1720    ibuprofen (ADVIL,MOTRIN) 600 MG tablet  2 times daily     11/08/18 1722    penicillin v potassium (VEETID) 500 MG tablet  4 times daily     11/08/18 1722           Janeece Fitting, PA-C 11/08/18 1725    Long, Wonda [facility], MD 11/08/18 [phone]

## 2018-12-06 IMAGING — DX DG SHOULDER 2+V*R*
3 series · 3 of 3 positions shown · non-contrast
Comparison: None.

CLINICAL DATA: Syncopal episode shoulder pain

EXAM:
RIGHT SHOULDER - 2+ VIEW

[shoulder grashey]
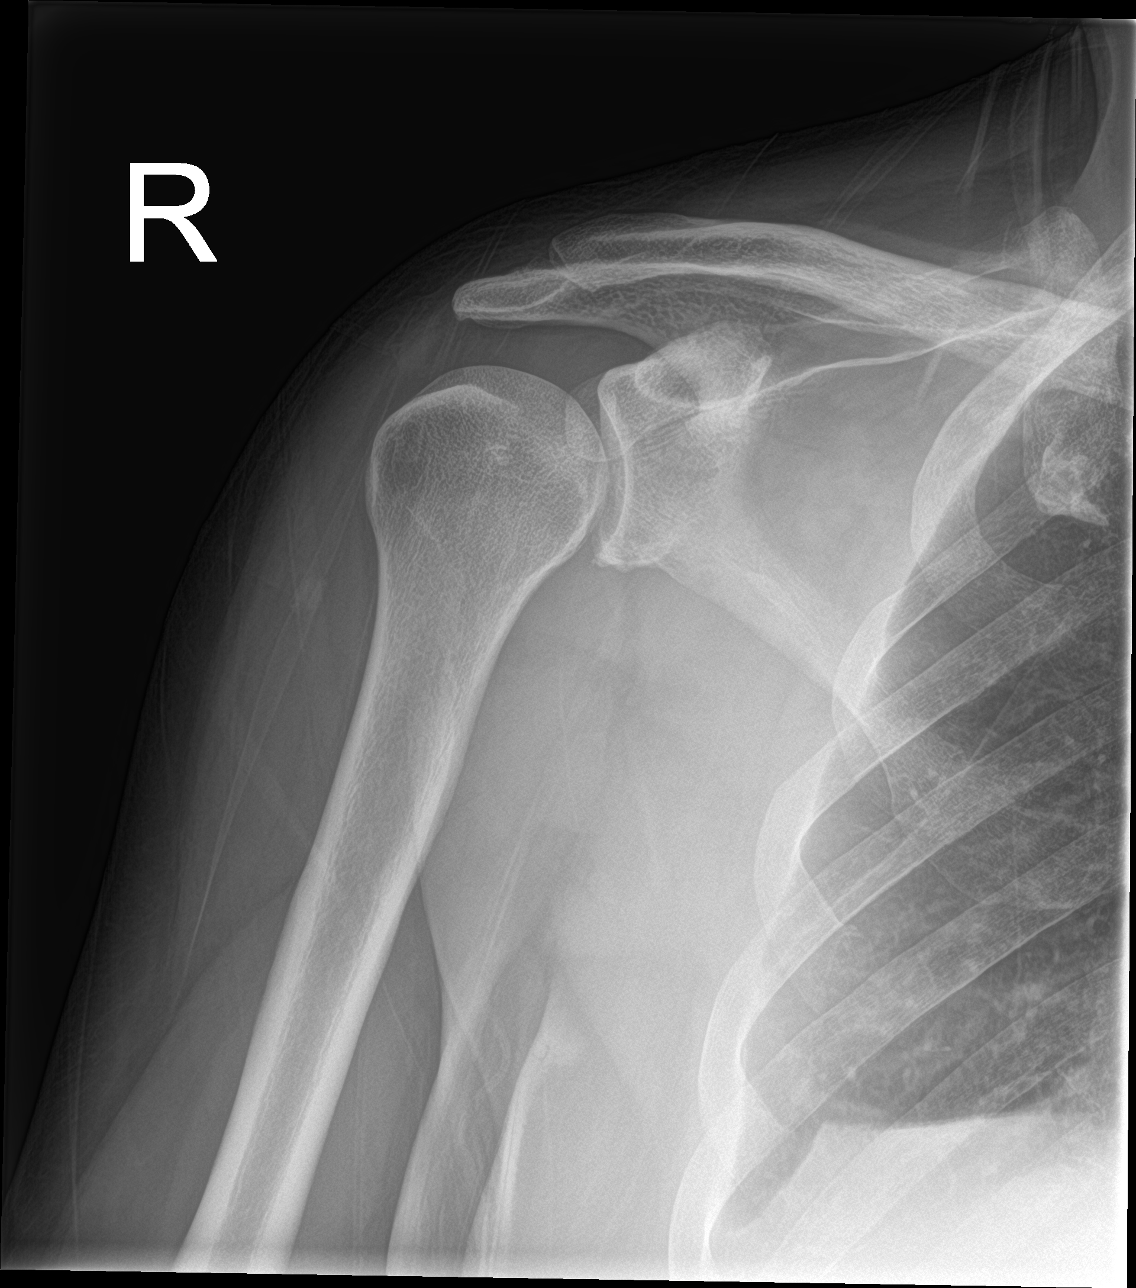

[shoulder y view]
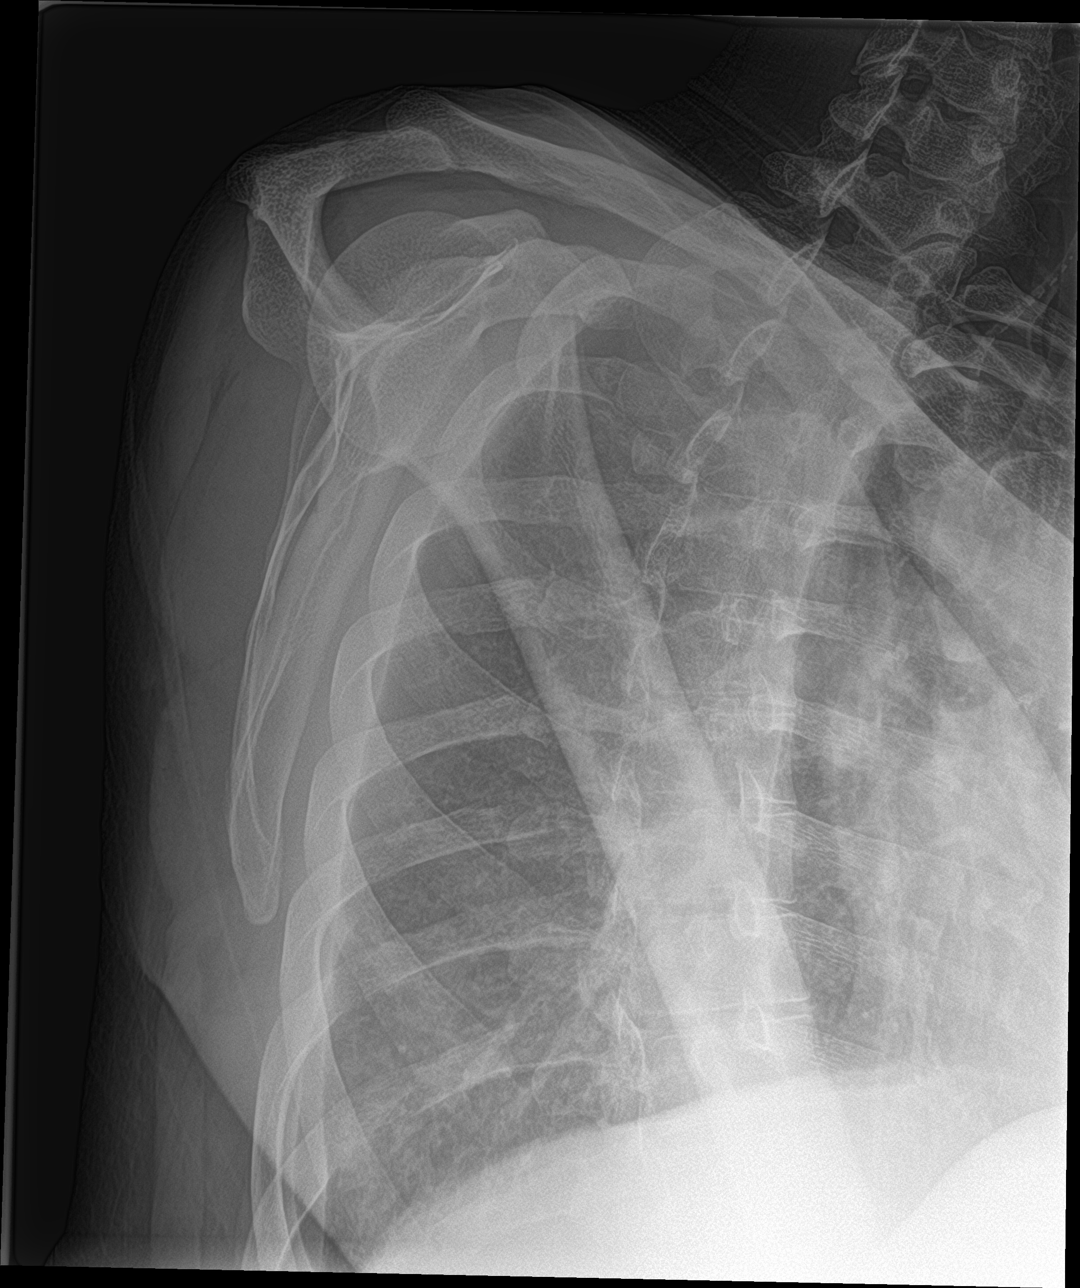

[shoulder axillary]
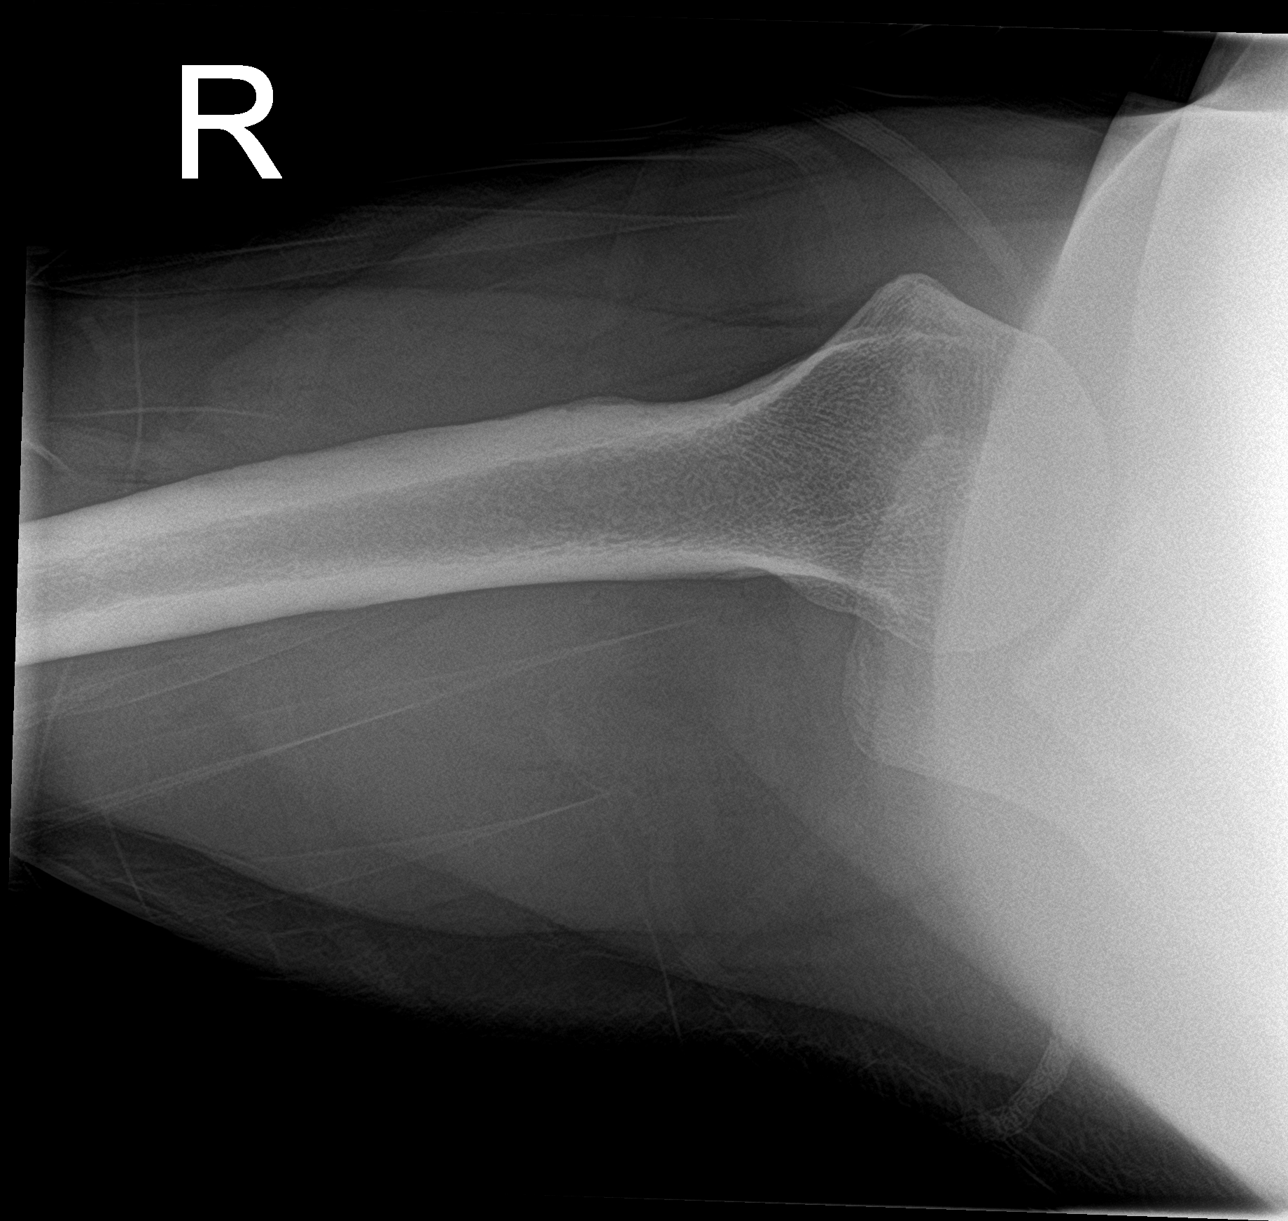

[3 of 3 positions shown; findings below may reference images not displayed]

FINDINGS: There is no evidence of fracture or dislocation. There is no
evidence of arthropathy or other focal bone abnormality. Soft
tissues are unremarkable.
IMPRESSION: Negative.

## 2021-03-15 ENCOUNTER — Other Ambulatory Visit: Payer: Self-pay

## 2021-03-15 ENCOUNTER — Emergency Department (HOSPITAL_COMMUNITY)
Admission: EM | Admit: 2021-03-15 | Discharge: 2021-03-16 | Disposition: A | Discharge status: Home / Self Care | Payer: Medicaid Other | Attending: Emergency Medicine | Admitting: Emergency Medicine

## 2021-03-15 DIAGNOSIS — E039 Hypothyroidism, unspecified: Secondary | ICD-10-CM | POA: Insufficient documentation

## 2021-03-15 DIAGNOSIS — I5042 Chronic combined systolic (congestive) and diastolic (congestive) heart failure: Secondary | ICD-10-CM | POA: Insufficient documentation

## 2021-03-15 DIAGNOSIS — M545 Low back pain, unspecified: Secondary | ICD-10-CM | POA: Diagnosis not present

## 2021-03-15 DIAGNOSIS — I110 Hypertensive heart disease with heart failure: Secondary | ICD-10-CM | POA: Insufficient documentation

## 2021-03-15 DIAGNOSIS — M79605 Pain in left leg: Secondary | ICD-10-CM

## 2021-03-15 DIAGNOSIS — F17200 Nicotine dependence, unspecified, uncomplicated: Secondary | ICD-10-CM | POA: Insufficient documentation

## 2021-03-15 DIAGNOSIS — M79652 Pain in left thigh: Secondary | ICD-10-CM | POA: Diagnosis not present

## 2021-03-15 DIAGNOSIS — J45909 Unspecified asthma, uncomplicated: Secondary | ICD-10-CM | POA: Diagnosis not present

## 2021-03-15 DIAGNOSIS — J449 Chronic obstructive pulmonary disease, unspecified: Secondary | ICD-10-CM | POA: Diagnosis not present

## 2021-03-15 DIAGNOSIS — Z79899 Other long term (current) drug therapy: Secondary | ICD-10-CM | POA: Diagnosis not present
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
## 2021-03-16 MED ORDER — PREDNISONE 20 MG PO TABS: 40.0000 mg | ORAL_TABLET | Freq: Every day | ORAL | 0 refills | Status: DC

## 2021-03-16 MED ORDER — PREDNISONE 20 MG PO TABS
60.0000 mg | ORAL_TABLET | Freq: Once | ORAL | Status: AC
  Administered 2021-03-16: 60 mg via ORAL
  Filled 2021-03-16: qty 3

## 2021-03-16 MED ORDER — KETOROLAC TROMETHAMINE 30 MG/ML IJ SOLN
30.0000 mg | Freq: Once | INTRAMUSCULAR | Status: AC
  Administered 2021-03-16: 30 mg via INTRAMUSCULAR
  Filled 2021-03-16: qty 1

## 2021-03-16 NOTE — Discharge Instructions (Addendum)
Please follow-up with the orthopedic doctor.
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
## 2021-03-20 ENCOUNTER — Ambulatory Visit: Payer: Medicaid Other | Admitting: Orthopedic Surgery

## 2021-04-02 ENCOUNTER — Emergency Department (HOSPITAL_BASED_OUTPATIENT_CLINIC_OR_DEPARTMENT_OTHER)
Admission: EM | Admit: 2021-04-02 | Discharge: 2021-04-02 | Disposition: A | Discharge status: Home / Self Care | Payer: Medicaid Other | Attending: Emergency Medicine | Admitting: Emergency Medicine

## 2021-04-02 ENCOUNTER — Other Ambulatory Visit: Payer: Self-pay

## 2021-04-02 ENCOUNTER — Encounter (HOSPITAL_BASED_OUTPATIENT_CLINIC_OR_DEPARTMENT_OTHER): Payer: Self-pay | Admitting: Obstetrics and Gynecology

## 2021-04-02 ENCOUNTER — Emergency Department (HOSPITAL_BASED_OUTPATIENT_CLINIC_OR_DEPARTMENT_OTHER): Payer: Medicaid Other

## 2021-04-02 ENCOUNTER — Emergency Department (HOSPITAL_BASED_OUTPATIENT_CLINIC_OR_DEPARTMENT_OTHER): Payer: Medicaid Other | Admitting: Radiology

## 2021-04-02 DIAGNOSIS — M25552 Pain in left hip: Secondary | ICD-10-CM | POA: Insufficient documentation

## 2021-04-02 DIAGNOSIS — I110 Hypertensive heart disease with heart failure: Secondary | ICD-10-CM | POA: Insufficient documentation

## 2021-04-02 DIAGNOSIS — M545 Low back pain, unspecified: Secondary | ICD-10-CM | POA: Insufficient documentation

## 2021-04-02 DIAGNOSIS — I5042 Chronic combined systolic (congestive) and diastolic (congestive) heart failure: Secondary | ICD-10-CM | POA: Diagnosis not present

## 2021-04-02 DIAGNOSIS — Z79899 Other long term (current) drug therapy: Secondary | ICD-10-CM | POA: Diagnosis not present

## 2021-04-02 DIAGNOSIS — R000 Tachycardia, unspecified: Secondary | ICD-10-CM | POA: Diagnosis not present

## 2021-04-02 DIAGNOSIS — J449 Chronic obstructive pulmonary disease, unspecified: Secondary | ICD-10-CM | POA: Diagnosis not present

## 2021-04-02 DIAGNOSIS — J45909 Unspecified asthma, uncomplicated: Secondary | ICD-10-CM | POA: Diagnosis not present

## 2021-04-02 DIAGNOSIS — M79605 Pain in left leg: Secondary | ICD-10-CM | POA: Insufficient documentation

## 2021-04-02 DIAGNOSIS — E039 Hypothyroidism, unspecified: Secondary | ICD-10-CM | POA: Diagnosis not present

## 2021-04-02 DIAGNOSIS — F17200 Nicotine dependence, unspecified, uncomplicated: Secondary | ICD-10-CM | POA: Diagnosis not present

## 2021-04-02 MED ORDER — CYCLOBENZAPRINE HCL 10 MG PO TABS: 10.0000 mg | ORAL_TABLET | Freq: Two times a day (BID) | ORAL | 0 refills | Status: DC | PRN

## 2021-04-02 MED ORDER — HYDROMORPHONE HCL 1 MG/ML IJ SOLN
1.0000 mg | Freq: Once | INTRAMUSCULAR | Status: AC
Start: 2021-04-02 — End: 2021-04-02
  Administered 2021-04-02: 1 mg via INTRAMUSCULAR
  Filled 2021-04-02: qty 1

## 2021-04-02 MED ORDER — PREDNISONE 20 MG PO TABS: 40.0000 mg | ORAL_TABLET | Freq: Every day | ORAL | 0 refills | Status: DC

## 2021-04-02 NOTE — Discharge Instructions (Addendum)
Recommend following up with an orthopedic specialist or spine specialist regarding your symptoms.  Recommend taking course of prednisone.  Can try muscle relaxer as needed.  Please follow-up with your cardiologist and your primary care doctor.

## 2021-04-02 NOTE — ED Notes (Signed)
Pt stated that the pain is a little bit better
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
## 2021-04-08 ENCOUNTER — Encounter (HOSPITAL_COMMUNITY): Payer: Self-pay

## 2021-04-08 ENCOUNTER — Emergency Department (HOSPITAL_COMMUNITY)
Admission: EM | Admit: 2021-04-08 | Discharge: 2021-04-08 | Disposition: A | Discharge status: Home / Self Care | Payer: Medicaid Other | Attending: Emergency Medicine | Admitting: Emergency Medicine

## 2021-04-08 DIAGNOSIS — M79652 Pain in left thigh: Secondary | ICD-10-CM | POA: Diagnosis present

## 2021-04-08 DIAGNOSIS — J449 Chronic obstructive pulmonary disease, unspecified: Secondary | ICD-10-CM | POA: Insufficient documentation

## 2021-04-08 DIAGNOSIS — J45909 Unspecified asthma, uncomplicated: Secondary | ICD-10-CM | POA: Diagnosis not present

## 2021-04-08 DIAGNOSIS — G8929 Other chronic pain: Secondary | ICD-10-CM | POA: Diagnosis not present

## 2021-04-08 DIAGNOSIS — Z87891 Personal history of nicotine dependence: Secondary | ICD-10-CM | POA: Insufficient documentation

## 2021-04-08 DIAGNOSIS — E039 Hypothyroidism, unspecified: Secondary | ICD-10-CM | POA: Insufficient documentation

## 2021-04-08 DIAGNOSIS — Z79899 Other long term (current) drug therapy: Secondary | ICD-10-CM | POA: Insufficient documentation

## 2021-04-08 DIAGNOSIS — Z8572 Personal history of non-Hodgkin lymphomas: Secondary | ICD-10-CM | POA: Diagnosis not present

## 2021-04-08 DIAGNOSIS — I110 Hypertensive heart disease with heart failure: Secondary | ICD-10-CM | POA: Diagnosis not present

## 2021-04-08 DIAGNOSIS — I5042 Chronic combined systolic (congestive) and diastolic (congestive) heart failure: Secondary | ICD-10-CM | POA: Insufficient documentation

## 2021-04-08 MED ORDER — PREDNISONE 20 MG PO TABS: ORAL_TABLET | ORAL | 0 refills | Status: DC

## 2021-04-08 MED ORDER — METHYLPREDNISOLONE SODIUM SUCC 125 MG IJ SOLR
125.0000 mg | Freq: Once | INTRAMUSCULAR | Status: AC
  Administered 2021-04-08: 125 mg via INTRAMUSCULAR
  Filled 2021-04-08: qty 2
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
## 2021-04-08 NOTE — Discharge Instructions (Signed)
Please read and follow all provided instructions.  Your diagnoses today include:  1. Chronic pain of left lower extremity     Tests performed today include: Vital signs. See below for your results today.   Medications prescribed:  Prednisone - steroid medicine   It is best to take this medication in the morning to prevent sleeping problems. If you are diabetic, monitor your blood sugar closely and stop taking Prednisone if blood sugar is over 300. Take with food to prevent stomach upset.   Take any prescribed medications only as directed.  Home care instructions:  Follow any educational materials contained in this packet Follow R.I.C.E. Protocol: R - rest your injury  I  - use ice on injury without applying directly to skin C - compress injury with bandage or splint E - elevate the injury as much as possible  Follow-up instructions: Please follow-up with your orthopedic physician (bone specialist) if you continue to have significant pain in 1 week.   Return instructions:  Please return if your toes or feet are numb or tingling, appear gray or blue, or you have severe pain (also elevate the leg and loosen splint or wrap if you were given one) Please return to the Emergency Department if you experience worsening symptoms.  Please return if you have any other emergent concerns.  Additional Information:  Your vital signs today were: BP 127/89 (BP Location: Left Arm)   Pulse (!) 114   Temp 98.2 F (36.8 C) (Oral)   Resp 20   Ht '5\' 11"'$  (1.803 m)   Wt 116.4 kg   SpO2 98%   BMI 35.78 kg/m  If your blood pressure (BP) was elevated above 135/85 this visit, please have this repeated by your doctor within one month. --------------

## 2021-04-13 ENCOUNTER — Other Ambulatory Visit: Payer: Self-pay

## 2021-04-13 ENCOUNTER — Emergency Department (HOSPITAL_COMMUNITY)
Admission: EM | Admit: 2021-04-13 | Discharge: 2021-04-14 | Disposition: A | Discharge status: Home / Self Care | Payer: Medicaid Other | Attending: Emergency Medicine | Admitting: Emergency Medicine

## 2021-04-13 ENCOUNTER — Encounter (HOSPITAL_COMMUNITY): Payer: Self-pay

## 2021-04-13 DIAGNOSIS — M545 Low back pain, unspecified: Secondary | ICD-10-CM | POA: Diagnosis not present

## 2021-04-13 DIAGNOSIS — I110 Hypertensive heart disease with heart failure: Secondary | ICD-10-CM | POA: Insufficient documentation

## 2021-04-13 DIAGNOSIS — R109 Unspecified abdominal pain: Secondary | ICD-10-CM | POA: Diagnosis not present

## 2021-04-13 DIAGNOSIS — J449 Chronic obstructive pulmonary disease, unspecified: Secondary | ICD-10-CM | POA: Insufficient documentation

## 2021-04-13 DIAGNOSIS — M79652 Pain in left thigh: Secondary | ICD-10-CM | POA: Diagnosis present

## 2021-04-13 DIAGNOSIS — E039 Hypothyroidism, unspecified: Secondary | ICD-10-CM | POA: Insufficient documentation

## 2021-04-13 DIAGNOSIS — M5410 Radiculopathy, site unspecified: Secondary | ICD-10-CM | POA: Diagnosis not present

## 2021-04-13 DIAGNOSIS — Z87891 Personal history of nicotine dependence: Secondary | ICD-10-CM | POA: Diagnosis not present

## 2021-04-13 DIAGNOSIS — I5042 Chronic combined systolic (congestive) and diastolic (congestive) heart failure: Secondary | ICD-10-CM | POA: Diagnosis not present

## 2021-04-13 NOTE — ED Triage Notes (Signed)
Pt complains of left leg pain that has been off and on for 1.5 months. Pt states that he was taking steroids for the pain but once he stopped taking them, the pain comes back.

## 2021-04-14 ENCOUNTER — Emergency Department (HOSPITAL_COMMUNITY): Payer: Medicaid Other

## 2021-04-14 ENCOUNTER — Encounter (HOSPITAL_COMMUNITY): Payer: Self-pay

## 2021-04-14 LAB — CBC WITH DIFFERENTIAL/PLATELET
Abs Immature Granulocytes: 0.06 10*3/uL (ref 0.00–0.07)
Basophils Absolute: 0 10*3/uL (ref 0.0–0.1)
Basophils Relative: 0 %
Eosinophils Absolute: 0.1 10*3/uL (ref 0.0–0.5)
Eosinophils Relative: 1 %
HCT: 39.7 % (ref 39.0–52.0)
Hemoglobin: 12.3 g/dL — ABNORMAL LOW (ref 13.0–17.0)
Immature Granulocytes: 1 %
Lymphocytes Relative: 21 %
Lymphs Abs: 1.7 10*3/uL (ref 0.7–4.0)
MCH: 28.4 pg (ref 26.0–34.0)
MCHC: 31 g/dL (ref 30.0–36.0)
MCV: 91.7 fL (ref 80.0–100.0)
Monocytes Absolute: 0.6 10*3/uL (ref 0.1–1.0)
Monocytes Relative: 8 %
Neutro Abs: 5.7 10*3/uL (ref 1.7–7.7)
Neutrophils Relative %: 69 %
Platelets: 281 10*3/uL (ref 150–400)
RBC: 4.33 MIL/uL (ref 4.22–5.81)
RDW: 17.1 % — ABNORMAL HIGH (ref 11.5–15.5)
WBC: 8.2 10*3/uL (ref 4.0–10.5)
nRBC: 0 % (ref 0.0–0.2)

## 2021-04-14 LAB — URINALYSIS, ROUTINE W REFLEX MICROSCOPIC
Bacteria, UA: NONE SEEN
Bilirubin Urine: NEGATIVE
Glucose, UA: NEGATIVE mg/dL
Hgb urine dipstick: NEGATIVE
Ketones, ur: NEGATIVE mg/dL
Leukocytes,Ua: NEGATIVE
Nitrite: NEGATIVE
Protein, ur: 30 mg/dL — AB
Specific Gravity, Urine: 1.023 (ref 1.005–1.030)
pH: 5 (ref 5.0–8.0)

## 2021-04-14 LAB — BASIC METABOLIC PANEL
Anion gap: 11 (ref 5–15)
BUN: 28 mg/dL — ABNORMAL HIGH (ref 6–20)
CO2: 27 mmol/L (ref 22–32)
Calcium: 8.7 mg/dL — ABNORMAL LOW (ref 8.9–10.3)
Chloride: 103 mmol/L (ref 98–111)
Creatinine, Ser: 0.96 mg/dL (ref 0.61–1.24)
GFR, Estimated: >60 mL/min (ref >60)
Glucose, Bld: 110 mg/dL — ABNORMAL HIGH (ref 70–99)
Potassium: 3.5 mmol/L (ref 3.5–5.1)
Sodium: 141 mmol/L (ref 135–145)

## 2021-04-14 LAB — CK: Total CK: 82 U/L (ref 49–397)

## 2021-04-14 MED ORDER — FUROSEMIDE 10 MG/ML IJ SOLN
40.0000 mg | Freq: Once | INTRAMUSCULAR | Status: AC
  Administered 2021-04-14: 40 mg via INTRAVENOUS
  Filled 2021-04-14: qty 4

## 2021-04-14 MED ORDER — PREDNISONE 20 MG PO TABS: 60.0000 mg | ORAL_TABLET | Freq: Every day | ORAL | 0 refills | Status: DC

## 2021-04-14 MED ORDER — ENTRESTO 49-51 MG PO TABS: 1.0000 | ORAL_TABLET | Freq: Every day | ORAL | 0 refills | Status: DC

## 2021-04-14 MED ORDER — IOHEXOL 350 MG/ML SOLN
80.0000 mL | Freq: Once | INTRAVENOUS | Status: AC | PRN
  Administered 2021-04-14: 80 mL via INTRAVENOUS

## 2021-04-14 MED ORDER — OMEPRAZOLE 20 MG PO CPDR: 20.0000 mg | DELAYED_RELEASE_CAPSULE | Freq: Every day | ORAL | 0 refills | Status: DC

## 2021-04-14 MED ORDER — GABAPENTIN 300 MG PO CAPS: 300.0000 mg | ORAL_CAPSULE | Freq: Three times a day (TID) | ORAL | 0 refills | Status: DC
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
## 2021-04-14 NOTE — ED Notes (Signed)
Requested patient to urinate. Gave patient urinal.

## 2021-05-09 ENCOUNTER — Encounter: Payer: Self-pay | Admitting: Neurology

## 2021-05-19 ENCOUNTER — Other Ambulatory Visit: Payer: Self-pay

## 2021-05-19 ENCOUNTER — Emergency Department (HOSPITAL_COMMUNITY)
Admission: EM | Admit: 2021-05-19 | Discharge: 2021-05-19 | Disposition: A | Discharge status: Home / Self Care | Payer: Medicaid Other | Attending: Emergency Medicine | Admitting: Emergency Medicine

## 2021-05-19 ENCOUNTER — Emergency Department (HOSPITAL_COMMUNITY): Payer: Medicaid Other

## 2021-05-19 DIAGNOSIS — J45909 Unspecified asthma, uncomplicated: Secondary | ICD-10-CM | POA: Diagnosis not present

## 2021-05-19 DIAGNOSIS — R059 Cough, unspecified: Secondary | ICD-10-CM

## 2021-05-19 DIAGNOSIS — E039 Hypothyroidism, unspecified: Secondary | ICD-10-CM | POA: Insufficient documentation

## 2021-05-19 DIAGNOSIS — Z79899 Other long term (current) drug therapy: Secondary | ICD-10-CM | POA: Diagnosis not present

## 2021-05-19 DIAGNOSIS — I5042 Chronic combined systolic (congestive) and diastolic (congestive) heart failure: Secondary | ICD-10-CM | POA: Insufficient documentation

## 2021-05-19 DIAGNOSIS — I110 Hypertensive heart disease with heart failure: Secondary | ICD-10-CM | POA: Insufficient documentation

## 2021-05-19 DIAGNOSIS — R0602 Shortness of breath: Secondary | ICD-10-CM | POA: Diagnosis present

## 2021-05-19 DIAGNOSIS — Z20822 Contact with and (suspected) exposure to covid-19: Secondary | ICD-10-CM | POA: Insufficient documentation

## 2021-05-19 DIAGNOSIS — Z87891 Personal history of nicotine dependence: Secondary | ICD-10-CM | POA: Diagnosis not present

## 2021-05-19 DIAGNOSIS — J441 Chronic obstructive pulmonary disease with (acute) exacerbation: Secondary | ICD-10-CM | POA: Diagnosis not present

## 2021-05-19 LAB — COMPREHENSIVE METABOLIC PANEL
ALT: 19 U/L (ref 0–44)
AST: 21 U/L (ref 15–41)
Albumin: 3.6 g/dL (ref 3.5–5.0)
Alkaline Phosphatase: 83 U/L (ref 38–126)
Anion gap: 10 (ref 5–15)
BUN: 9 mg/dL (ref 6–20)
CO2: 25 mmol/L (ref 22–32)
Calcium: 8.9 mg/dL (ref 8.9–10.3)
Chloride: 104 mmol/L (ref 98–111)
Creatinine, Ser: 0.99 mg/dL (ref 0.61–1.24)
GFR, Estimated: >60 mL/min (ref >60)
Glucose, Bld: 109 mg/dL — ABNORMAL HIGH (ref 70–99)
Potassium: 3.5 mmol/L (ref 3.5–5.1)
Sodium: 139 mmol/L (ref 135–145)
Total Bilirubin: 0.3 mg/dL (ref 0.3–1.2)
Total Protein: 6.9 g/dL (ref 6.5–8.1)

## 2021-05-19 LAB — CBC WITH DIFFERENTIAL/PLATELET
Abs Immature Granulocytes: 0.01 10*3/uL (ref 0.00–0.07)
Basophils Absolute: 0 10*3/uL (ref 0.0–0.1)
Basophils Relative: 1 %
Eosinophils Absolute: 0.1 10*3/uL (ref 0.0–0.5)
Eosinophils Relative: 2 %
HCT: 40.2 % (ref 39.0–52.0)
Hemoglobin: 12.5 g/dL — ABNORMAL LOW (ref 13.0–17.0)
Immature Granulocytes: 0 %
Lymphocytes Relative: 20 %
Lymphs Abs: 1.2 10*3/uL (ref 0.7–4.0)
MCH: 28.3 pg (ref 26.0–34.0)
MCHC: 31.1 g/dL (ref 30.0–36.0)
MCV: 91.2 fL (ref 80.0–100.0)
Monocytes Absolute: 0.6 10*3/uL (ref 0.1–1.0)
Monocytes Relative: 10 %
Neutro Abs: 4.2 10*3/uL (ref 1.7–7.7)
Neutrophils Relative %: 67 %
Platelets: 376 10*3/uL (ref 150–400)
RBC: 4.41 MIL/uL (ref 4.22–5.81)
RDW: 16.7 % — ABNORMAL HIGH (ref 11.5–15.5)
WBC: 6.1 10*3/uL (ref 4.0–10.5)
nRBC: 0 % (ref 0.0–0.2)

## 2021-05-19 LAB — RAPID URINE DRUG SCREEN, HOSP PERFORMED
Amphetamines: NOT DETECTED
Barbiturates: NOT DETECTED
Benzodiazepines: NOT DETECTED
Cocaine: POSITIVE — AB
Opiates: NOT DETECTED
Tetrahydrocannabinol: NOT DETECTED

## 2021-05-19 LAB — RESP PANEL BY RT-PCR (FLU A&B, COVID) ARPGX2
Influenza A by PCR: NEGATIVE
Influenza B by PCR: NEGATIVE
SARS Coronavirus 2 by RT PCR: NEGATIVE

## 2021-05-19 LAB — BRAIN NATRIURETIC PEPTIDE: B Natriuretic Peptide: 424.7 pg/mL — ABNORMAL HIGH (ref 0.0–100.0)

## 2021-05-19 MED ORDER — ALBUTEROL SULFATE (2.5 MG/3ML) 0.083% IN NEBU
2.5000 mg | INHALATION_SOLUTION | Freq: Once | RESPIRATORY_TRACT | Status: AC
  Administered 2021-05-19: 2.5 mg via RESPIRATORY_TRACT
  Filled 2021-05-19: qty 3

## 2021-05-19 MED ORDER — IPRATROPIUM-ALBUTEROL 0.5-2.5 (3) MG/3ML IN SOLN
3.0000 mL | Freq: Once | RESPIRATORY_TRACT | Status: AC
  Administered 2021-05-19: 3 mL via RESPIRATORY_TRACT
  Filled 2021-05-19: qty 3

## 2021-05-19 MED ORDER — METHYLPREDNISOLONE SODIUM SUCC 125 MG IJ SOLR
125.0000 mg | Freq: Once | INTRAMUSCULAR | Status: AC
  Administered 2021-05-19: 125 mg via INTRAVENOUS
  Filled 2021-05-19: qty 2

## 2021-05-19 MED ORDER — DOXYCYCLINE HYCLATE 100 MG PO CAPS: 100.0000 mg | ORAL_CAPSULE | Freq: Two times a day (BID) | ORAL | 0 refills | Status: DC

## 2021-05-19 MED ORDER — BENZONATATE 200 MG PO CAPS: 200.0000 mg | ORAL_CAPSULE | Freq: Three times a day (TID) | ORAL | 0 refills | Status: AC

## 2021-05-19 MED ORDER — PREDNISONE 10 MG PO TABS: 20.0000 mg | ORAL_TABLET | Freq: Every day | ORAL | 0 refills | Status: DC

## 2021-05-19 MED ORDER — DOXYCYCLINE HYCLATE 100 MG PO TABS
100.0000 mg | ORAL_TABLET | Freq: Once | ORAL | Status: AC
  Administered 2021-05-19: 100 mg via ORAL
  Filled 2021-05-19: qty 1

## 2021-05-19 MED ORDER — ALBUTEROL SULFATE HFA 108 (90 BASE) MCG/ACT IN AERS
1.0000 | INHALATION_SPRAY | Freq: Once | RESPIRATORY_TRACT | Status: AC
  Administered 2021-05-19: 2 via RESPIRATORY_TRACT
  Filled 2021-05-19: qty 6.7

## 2021-05-19 NOTE — ED Provider Notes (Signed)
[facility] EMERGENCY DEPARTMENT Provider Note   CSN: [id number] Arrival date & time: 05/19/21  1801     History Chief Complaint  Patient presents with  . Shortness of Breath    Leonard [name] is a 45 y.o. male.  44 year old male with multiple chronic medical problems including CHF, COPD, asthma with additional history as listed below presents with complaint of cough (with yellow/brown mucous production), wheezing, shortness of breath with body aches, chills, sweats and occasional loose stools.  Denies fevers, chest pain, sick contacts.  Has been using his Advair and albuterol inhaler at home without improvement in his symptoms.  Reports symptoms starting yesterday.  No other complaints or concerns today.  Leonard [name] was evaluated in Emergency Department on 05/19/2021 for the symptoms described in the history of present illness. He was evaluated in the context of the global COVID-19 pandemic, which necessitated consideration that the patient might be at risk for infection with the SARS-CoV-2 virus that causes COVID-19. Institutional protocols and algorithms that pertain to the evaluation of patients at risk for COVID-19 are in a state of rapid change based on information released by regulatory bodies including the CDC and federal and state organizations. These policies and algorithms were followed during the patient's care in the ED.       Past Medical History:  Diagnosis Date  . AKI (acute kidney injury) ([place]) 11/09/2016  . Asthma   . CHF (congestive heart failure) ([place]) 04/05/2016  . Chronic bronchitis ([place])   . Chronic lower back pain   . Cocaine abuse ([place]) 09/03/2017  . COPD (chronic obstructive pulmonary disease) ([place])   . GERD (gastroesophageal reflux disease)   . Headache    "weekly" (09/03/2017)  . History of blood transfusion    "related to CA" (09/03/2017)  . Hypertension   . Hypothyroidism   . Non Hodgkin's lymphoma ([place]) 03/2002   "stage IV"  .  Non Hodgkin's lymphoma ([place]) 04/2012   "stage II"  . Pneumonia    "several times" (09/03/2017)  . Sleep apnea    "went to sleep study; didn't have my normal episodes of choking /waking up not able to breathe" (09/03/2017)    Patient Active Problem List   Diagnosis Date Noted  . Acute on chronic HFrEF (heart failure with reduced ejection fraction) ([place]) 09/03/2017  . Cocaine abuse ([place]) 09/03/2017  . Lightheaded 01/22/2017  . AKI (acute kidney injury) ([place]) 11/09/2016  . Abdominal wall abscess at site of surgical wound   . Fever   . Cellulitis 10/22/2016  . Postoperative ileus ([place]) 10/21/2016  . Incarcerated hernia 10/18/2016  . Right lower lobe lung mass 10/18/2016  . Acute on chronic congestive heart failure ([place])   . Incarcerated umbilical hernia   . Asthma 10/06/2016  . History of substance abuse ([place]) 10/06/2016  . TSH elevation 10/06/2016  . Bronchitis 10/06/2016  . Chronic pain syndrome 10/06/2016  . History of non-Hodgkin's lymphoma 04/30/2016  . Snoring 04/30/2016  . Chronic combined systolic and diastolic congestive heart failure ([place]) 04/05/2016  . Essential hypertension 10/09/2015    Past Surgical History:  Procedure Laterality Date  . CARDIAC CATHETERIZATION N/A 04/15/2016   Procedure: Right/Left Heart Cath and Coronary Angiography;  Surgeon: Larey Dresser, MD;  Location: [facility];  Service: Cardiovascular;  Laterality: N/A;  . HERNIA REPAIR    . IR RADIOLOGIST EVAL & MGMT  11/19/2016  . IR RADIOLOGIST EVAL & MGMT  11/05/2016  . LAPAROSCOPIC ASSISTED SPIGELIAN HERNIA REPAIR  N/A 10/18/2016   Procedure: LAPAROSCOPIC REPAIR OF UMBILICAL HERNIA  WITH MESH AND LYSIS OF ADHESIONS.;  Surgeon: [name], MD;  Location: [facility];  Service: General;  Laterality: N/A;  . tumor biopsy  03/2002   chest  . WISDOM TOOTH EXTRACTION         Family History  Problem Relation Age of Onset  . Cancer Mother   . Emphysema Mother   . Bronchitis Mother      Social History   Tobacco Use  . Smoking status: Former    Packs/day: 1.25    Years: 15.00    Pack years: 18.75    Types: Cigarettes    Quit date: 04/08/2007    Years since quitting: 14.1  . Smokeless tobacco: Current    Types: Snuff  Vaping Use  . Vaping Use: Never used  Substance Use Topics  . Alcohol use: No    Alcohol/week: 0.0 standard drinks  . Drug use: Not Currently    Types: Cocaine    Comment: last used yesterday     Home Medications Prior to Admission medications   Medication Sig Start Date End Date Taking? Authorizing Provider  benzonatate (TESSALON) 200 MG capsule Take 1 capsule (200 mg total) by mouth every 8 (eight) hours for 10 days. 05/19/21 05/29/21 Yes Tacy Learn, PA-C  doxycycline (VIBRAMYCIN) 100 MG capsule Take 1 capsule (100 mg total) by mouth 2 (two) times daily. 05/19/21  Yes Tacy Learn, PA-C  predniSONE (DELTASONE) 10 MG tablet Take 2 tablets (20 mg total) by mouth daily. 05/19/21  Yes Tacy Learn, PA-C  cyclobenzaprine (FLEXERIL) 10 MG tablet Take 1 tablet (10 mg total) by mouth 2 (two) times daily as needed for muscle spasms. 04/02/21   Lucrezia Starch, MD  furosemide (LASIX) 40 MG tablet Take 1 tablet (40 mg total) by mouth 2 (two) times daily. 11/09/17   Larey Dresser, MD  gabapentin (NEURONTIN) 300 MG capsule Take 1 capsule (300 mg total) by mouth 3 (three) times daily. 04/14/21   Quintella Reichert, MD  omeprazole (PRILOSEC) 20 MG capsule Take 1 capsule (20 mg total) by mouth daily. 04/14/21   Quintella Reichert, MD  potassium chloride SA (K-DUR,KLOR-CON) 20 MEQ tablet Take 1 tablet (20 mEq total) by mouth daily. 11/09/17   Larey Dresser, MD  sacubitril-valsartan (ENTRESTO) 49-51 MG Take 1 tablet by mouth daily. 04/14/21   Quintella Reichert, MD    Allergies    Patient has no known allergies.  Review of Systems   Review of Systems  Constitutional:  Positive for chills and diaphoresis. Negative for fever.  HENT:  Negative for congestion  and sore throat.   Respiratory:  Positive for cough, shortness of breath and wheezing.   Cardiovascular:  Negative for chest pain and leg swelling.  Gastrointestinal:  Positive for diarrhea. Negative for abdominal pain, constipation, nausea and vomiting.  Genitourinary:  Negative for difficulty urinating.  Musculoskeletal:  Positive for arthralgias and myalgias.  Skin:  Negative for rash and wound.  Allergic/Immunologic: Negative for immunocompromised state.  Neurological:  Negative for weakness.  Hematological:  Negative for adenopathy.  Psychiatric/Behavioral:  Negative for confusion.   All other systems reviewed and are negative.  Physical Exam Updated Vital Signs BP (!) 144/99   Pulse (!) 105   Temp 98.3 F (36.8 C) (Oral)   Resp (!) 22   Ht 5\' 11"  (1.803 m)   Wt 117.9 kg   SpO2 98%   BMI 36.26 kg/m  Physical Exam Vitals and nursing note reviewed.  Constitutional:      General: He is not in acute distress.    Appearance: He is well-developed. He is obese. He is not diaphoretic.  HENT:     Head: Normocephalic and atraumatic.  Cardiovascular:     Rate and Rhythm: Regular rhythm. Tachycardia present.  Pulmonary:     Effort: Tachypnea present.     Breath sounds: Decreased breath sounds and wheezing present.     Comments: Speaks in short sentences Abdominal:     Palpations: Abdomen is soft.     Tenderness: There is no abdominal tenderness.  Musculoskeletal:     Right lower leg: No edema.     Left lower leg: No edema.  Skin:    General: Skin is warm and dry.  Neurological:     Mental Status: He is alert and oriented to person, place, and time.  Psychiatric:        Behavior: Behavior normal.    ED Results / Procedures / Treatments   Labs (all labs ordered are listed, but only abnormal results are displayed) Labs Reviewed  CBC WITH DIFFERENTIAL/PLATELET - Abnormal; Notable for the following components:      Result Value   Hemoglobin 12.5 (*)    RDW 16.7 (*)     All other components within normal limits  COMPREHENSIVE METABOLIC PANEL - Abnormal; Notable for the following components:   Glucose, Bld 109 (*)    All other components within normal limits  BRAIN NATRIURETIC PEPTIDE - Abnormal; Notable for the following components:   B Natriuretic Peptide 424.7 (*)    All other components within normal limits  RESP PANEL BY RT-PCR (FLU A&B, COVID) [id number]    EKG EKG Interpretation  Date/Time:  Monday May 19 2021 18:33:20 EDT Ventricular Rate:  107 PR Interval:  156 QRS Duration: 110 QT Interval:  392 QTC Calculation: 523 R Axis:   80 Text Interpretation: Sinus tachycardia Incomplete left bundle branch block Minimal voltage criteria for LVH, may be normal variant ( Sokolow-Lyon ) Nonspecific T wave abnormality Prolonged QT Abnormal ECG Confirmed by Regan Lemming ([id number]) on 05/19/2021 9:05:48 PM  Radiology DG Chest 1 View  Result Date: 05/19/2021 CLINICAL DATA:  Cough and nausea for 1 day.  CHF.  COPD.  Ex-smoker. EXAM: CHEST  1 VIEW COMPARISON:  01/14/2018 from [facility] FINDINGS: Midline trachea. Mild cardiomegaly. No pleural effusion or pneumothorax. Right apical opacity medially is at the site of collapse/atelectasis on prior plain film and CT of 2019. No new pulmonary opacity. No congestive failure. IMPRESSION: No acute cardiopulmonary disease. Cardiomegaly without congestive failure. Chronic collapse/atelectasis of the left upper lobe compared to 01/14/2018. Electronically Signed   By: Abigail Miyamoto M.D.   On: 05/19/2021 20:16    Procedures Procedures   Medications Ordered in ED Medications  ipratropium-albuterol (DUONEB) 0.5-2.5 (3) MG/3ML nebulizer solution 3 mL (3 mLs Nebulization Given 05/19/21 1951)  methylPREDNISolone sodium succinate (SOLU-MEDROL) 125 mg/2 mL injection 125 mg (125 mg Intravenous Given 05/19/21 1950)  albuterol (PROVENTIL) (2.5 MG/3ML) 0.083% nebulizer solution 2.5 mg (2.5 mg Nebulization Given 05/19/21 2107)   doxycycline (VIBRA-TABS) tablet 100 mg (100 mg Oral Given 05/19/21 2209)  albuterol (VENTOLIN HFA) 108 (90 Base) MCG/ACT inhaler 1-2 puff (2 puffs Inhalation Given 05/19/21 2210)    ED Course  I have reviewed the triage vital signs and the nursing notes.  Pertinent labs & imaging results that were available during my care of the patient were reviewed  by me and considered in my medical decision making (see chart for details).  Clinical Course as of 05/19/21 2219  Mon May 19, 3233  [id number] 45 year old male with productive cough, wheezing, chills, sweats, loose stool.  On exam, patient is tachypneic, able to speak in short sentences with audible wheezing.  Found to have wheezing and diminished lung sounds throughout.  Patient was given a DuoNeb treatment and solumerol with some improvement, reports feeling slightly better.  Discussed with Dr. Kellie Simmering, ER attending who has seen the patient, feels patient has more coarse lung sounds at this point, with history, recommends treating for pneumonia. Patient was given an additional albuterol nebulizer, reports more improvement.  CBC unremarkable, CMP without significant findings or electrolyte derangement.  BNP mildly elevated at 424, not significantly changed from prior, no lower extremity edema, does not appear overloaded on chest x-ray.  COVID and flu negative. Chest x-ray shows cardiomegaly without CHF.  Reports chronic collapse or atelectasis of the left upper lobe compared to Jan 14, 2018 image. Discussed plan of care with patient who is agreeable with plan for discharge home on antibiotics and prednisone.  Recommend recheck with PCP this week, return to ED at anytime for worsening or concerning symptoms.  Review of vitals, patient is afebrile, he is tachycardic throughout his ER stay, improved to 105 at time of discharge.  Review of prior visits, tachycardic on prior visits as well.  O2 sat 95 to 100% on room air throughout his stay. [LM]    Clinical  Course User Index [LM] Roque Lias   MDM Rules/Calculators/A&P                           Final Clinical Impression(s) / ED Diagnoses Final diagnoses:  COPD exacerbation ([place])    Rx / DC Orders ED Discharge Orders          Ordered    predniSONE (DELTASONE) 10 MG tablet  Daily        05/19/21 2106    doxycycline (VIBRAMYCIN) 100 MG capsule  2 times daily        05/19/21 2106    benzonatate (TESSALON) 200 MG capsule  Every 8 hours        05/19/21 2106             Tacy Learn, PA-C 05/19/21 2219    Regan Lemming, MD 05/20/21 1507

## 2021-05-19 NOTE — ED Provider Notes (Signed)
Emergency Medicine Provider Triage Evaluation Note  Leonard [name] , a 45 y.o. male  was evaluated in triage.  Pt complains of cough, chills, sob and body aches.  Review of Systems  Positive: cough, chills, sob and body aches. Negative: vomiting  Physical Exam  BP 132/88 (BP Location: Left Arm)   Pulse (!) 112   Temp 99 F (37.2 C) (Oral)   Resp (!) 38   SpO2 95%  Gen:   Awake, no distress   Resp:  Normal effort  MSK:   Moves extremities without difficulty   Medical Decision Making  Medically screening exam initiated at 6:22 PM.  Appropriate orders placed.  Leonard [name] was informed that the remainder of the evaluation will be completed by another provider, this initial triage assessment does not replace that evaluation, and the importance of remaining in the ED until their evaluation is complete.     Bishop Dublin 05/19/21 1823    Dorie Rank, MD 05/19/21 2149

## 2021-05-19 NOTE — Discharge Instructions (Addendum)
Follow-up with your doctor this week.  Return to the emergency room for worsening or concerning symptoms.  Take antibiotics as prescribed and complete the full course.  Take prednisone as prescribed and complete the full course.

## 2021-05-19 NOTE — ED Triage Notes (Signed)
Pt arrives with one day of chills, sweats, body aches, productive cough, shortness of breath, and fatigue. Hx CHF and COPD. Labored breathing in triage.

## 2021-06-20 ENCOUNTER — Ambulatory Visit: Payer: Medicaid Other | Admitting: Neurology

## 2021-06-20 ENCOUNTER — Other Ambulatory Visit: Payer: Self-pay

## 2021-07-16 ENCOUNTER — Ambulatory Visit: Payer: Medicaid Other | Admitting: Neurology

## 2021-08-16 ENCOUNTER — Other Ambulatory Visit: Payer: Self-pay

## 2021-08-16 ENCOUNTER — Encounter (HOSPITAL_COMMUNITY): Payer: Self-pay | Admitting: Emergency Medicine

## 2021-08-16 ENCOUNTER — Emergency Department (HOSPITAL_COMMUNITY)
Admission: EM | Admit: 2021-08-16 | Discharge: 2021-08-16 | Disposition: A | Discharge status: Home / Self Care | Payer: Medicaid Other | Attending: Emergency Medicine | Admitting: Emergency Medicine

## 2021-08-16 ENCOUNTER — Emergency Department (HOSPITAL_COMMUNITY): Payer: Medicaid Other

## 2021-08-16 DIAGNOSIS — Z79899 Other long term (current) drug therapy: Secondary | ICD-10-CM | POA: Insufficient documentation

## 2021-08-16 DIAGNOSIS — I110 Hypertensive heart disease with heart failure: Secondary | ICD-10-CM | POA: Diagnosis not present

## 2021-08-16 DIAGNOSIS — E039 Hypothyroidism, unspecified: Secondary | ICD-10-CM | POA: Diagnosis not present

## 2021-08-16 DIAGNOSIS — Z8719 Personal history of other diseases of the digestive system: Secondary | ICD-10-CM

## 2021-08-16 DIAGNOSIS — R0602 Shortness of breath: Secondary | ICD-10-CM | POA: Diagnosis present

## 2021-08-16 DIAGNOSIS — M79673 Pain in unspecified foot: Secondary | ICD-10-CM | POA: Insufficient documentation

## 2021-08-16 DIAGNOSIS — Z20822 Contact with and (suspected) exposure to covid-19: Secondary | ICD-10-CM | POA: Insufficient documentation

## 2021-08-16 DIAGNOSIS — Z87891 Personal history of nicotine dependence: Secondary | ICD-10-CM | POA: Diagnosis not present

## 2021-08-16 DIAGNOSIS — J449 Chronic obstructive pulmonary disease, unspecified: Secondary | ICD-10-CM | POA: Insufficient documentation

## 2021-08-16 DIAGNOSIS — I5042 Chronic combined systolic (congestive) and diastolic (congestive) heart failure: Secondary | ICD-10-CM | POA: Diagnosis not present

## 2021-08-16 DIAGNOSIS — I509 Heart failure, unspecified: Secondary | ICD-10-CM

## 2021-08-16 DIAGNOSIS — J45909 Unspecified asthma, uncomplicated: Secondary | ICD-10-CM | POA: Diagnosis not present

## 2021-08-16 LAB — CBC WITH DIFFERENTIAL/PLATELET
Abs Immature Granulocytes: 0.16 10*3/uL — ABNORMAL HIGH (ref 0.00–0.07)
Basophils Absolute: 0.1 10*3/uL (ref 0.0–0.1)
Basophils Relative: 1 %
Eosinophils Absolute: 0.1 10*3/uL (ref 0.0–0.5)
Eosinophils Relative: 1 %
HCT: 40.8 % (ref 39.0–52.0)
Hemoglobin: 11.9 g/dL — ABNORMAL LOW (ref 13.0–17.0)
Immature Granulocytes: 2 %
Lymphocytes Relative: 21 %
Lymphs Abs: 2 10*3/uL (ref 0.7–4.0)
MCH: 24.2 pg — ABNORMAL LOW (ref 26.0–34.0)
MCHC: 29.2 g/dL — ABNORMAL LOW (ref 30.0–36.0)
MCV: 82.9 fL (ref 80.0–100.0)
Monocytes Absolute: 0.9 10*3/uL (ref 0.1–1.0)
Monocytes Relative: 9 %
Neutro Abs: 6.6 10*3/uL (ref 1.7–7.7)
Neutrophils Relative %: 66 %
Platelets: 506 10*3/uL — ABNORMAL HIGH (ref 150–400)
RBC: 4.92 MIL/uL (ref 4.22–5.81)
RDW: 19.7 % — ABNORMAL HIGH (ref 11.5–15.5)
WBC: 9.9 10*3/uL (ref 4.0–10.5)
nRBC: 0.5 % — ABNORMAL HIGH (ref 0.0–0.2)

## 2021-08-16 LAB — RESP PANEL BY RT-PCR (FLU A&B, COVID) ARPGX2
Influenza A by PCR: NEGATIVE
Influenza B by PCR: NEGATIVE
SARS Coronavirus 2 by RT PCR: NEGATIVE

## 2021-08-16 LAB — BASIC METABOLIC PANEL
Anion gap: 9 (ref 5–15)
BUN: 22 mg/dL — ABNORMAL HIGH (ref 6–20)
CO2: 31 mmol/L (ref 22–32)
Calcium: 8.8 mg/dL — ABNORMAL LOW (ref 8.9–10.3)
Chloride: 101 mmol/L (ref 98–111)
Creatinine, Ser: 1.09 mg/dL (ref 0.61–1.24)
GFR, Estimated: >60 mL/min (ref >60)
Glucose, Bld: 96 mg/dL (ref 70–99)
Potassium: 4 mmol/L (ref 3.5–5.1)
Sodium: 141 mmol/L (ref 135–145)

## 2021-08-16 LAB — C-REACTIVE PROTEIN: CRP: 1.1 mg/dL — ABNORMAL HIGH (ref <1.0)

## 2021-08-16 LAB — TSH: TSH: 18.264 u[IU]/mL — ABNORMAL HIGH (ref 0.350–4.500)

## 2021-08-16 LAB — LACTIC ACID, PLASMA: Lactic Acid, Venous: 1.4 mmol/L (ref 0.5–1.9)

## 2021-08-16 LAB — TROPONIN I (HIGH SENSITIVITY)
Troponin I (High Sensitivity): 23 ng/L — ABNORMAL HIGH (ref <18)
Troponin I (High Sensitivity): 26 ng/L — ABNORMAL HIGH (ref <18)

## 2021-08-16 LAB — SEDIMENTATION RATE: Sed Rate: 8 mm/hr (ref 0–16)

## 2021-08-16 LAB — T4, FREE: Free T4: 0.62 ng/dL (ref 0.61–1.12)

## 2021-08-16 LAB — BRAIN NATRIURETIC PEPTIDE: B Natriuretic Peptide: 449.6 pg/mL — ABNORMAL HIGH (ref 0.0–100.0)

## 2021-08-16 MED ORDER — IPRATROPIUM-ALBUTEROL 0.5-2.5 (3) MG/3ML IN SOLN
3.0000 mL | Freq: Once | RESPIRATORY_TRACT | Status: AC
  Administered 2021-08-16: 20:00:00 3 mL via RESPIRATORY_TRACT
  Filled 2021-08-16: qty 3

## 2021-08-16 MED ORDER — NAPROXEN 500 MG PO TABS: 500.0000 mg | ORAL_TABLET | Freq: Two times a day (BID) | ORAL | 0 refills | Status: DC

## 2021-08-16 MED ORDER — AMOXICILLIN-POT CLAVULANATE 875-125 MG PO TABS: 1.0000 | ORAL_TABLET | Freq: Two times a day (BID) | ORAL | 0 refills | Status: DC

## 2021-08-16 MED ORDER — HYDROMORPHONE HCL 1 MG/ML IJ SOLN
1.0000 mg | Freq: Once | INTRAMUSCULAR | Status: AC
  Administered 2021-08-16: 20:00:00 1 mg via INTRAVENOUS
  Filled 2021-08-16: qty 1

## 2021-08-16 MED ORDER — FUROSEMIDE 10 MG/ML IJ SOLN
40.0000 mg | Freq: Once | INTRAMUSCULAR | Status: AC
  Administered 2021-08-16: 20:00:00 40 mg via INTRAVENOUS
  Filled 2021-08-16: qty 4

## 2021-08-16 NOTE — Discharge Instructions (Addendum)
Your symptoms today are likely due to your uncontrolled CHF. We gave you some furosemide here in the ED, but please take this at home orally. You can take 40mg  Lasix at a time. The best thing you can do is set up an appointment with a cardiologist who can better manage and change your medications to work best for you. Additionally, I have sent in a referral to a pulmonologist who can help with your COPD.   I have also sent in a prescription for Augmentin which you will take as directed for your dental abscesses. Please follow up with your dentist who can determine further management of them.

## 2021-08-16 NOTE — ED Provider Notes (Signed)
[facility] EMERGENCY DEPARTMENT Provider Note   CSN: [id number] Arrival date & time: 08/16/21  1743     History Chief Complaint  Patient presents with   Shortness of Breath    Leonard [name] is a 45 y.o. male with history of CHF, COPD, hypothyroidism, non-Hodgkin's lymphoma with treatment completed in 2014 who presents to the ED for evaluation of worsening shortness of breath and foot pain.    Shortness of Breath     Past Medical History:  Diagnosis Date   AKI (acute kidney injury) ([place]) 11/09/2016   Asthma    CHF (congestive heart failure) ([place]) 04/05/2016   Chronic bronchitis (HCC)    Chronic lower back pain    Cocaine abuse ([place]) 09/03/2017   COPD (chronic obstructive pulmonary disease) ([place])    GERD (gastroesophageal reflux disease)    Headache    "weekly" (09/03/2017)   History of blood transfusion    "related to CA" (09/03/2017)   Hypertension    Hypothyroidism    Non Hodgkin's lymphoma ([place]) 03/2002   "stage IV"   Non Hodgkin's lymphoma ([place]) 04/2012   "stage II"   Pneumonia    "several times" (09/03/2017)   Sleep apnea    "went to sleep study; didn't have my normal episodes of choking /waking up not able to breathe" (09/03/2017)    Patient Active Problem List   Diagnosis Date Noted   Acute on chronic HFrEF (heart failure with reduced ejection fraction) ([place]) 09/03/2017   Cocaine abuse ([place]) 09/03/2017   Lightheaded 01/22/2017   AKI (acute kidney injury) ([place]) 11/09/2016   Abdominal wall abscess at site of surgical wound    Fever    Cellulitis 10/22/2016   Postoperative ileus ([place]) 10/21/2016   Incarcerated hernia 10/18/2016   Right lower lobe lung mass 10/18/2016   Acute on chronic congestive heart failure ([place])    Incarcerated umbilical hernia    Asthma 10/06/2016   History of substance abuse ([place]) 10/06/2016   TSH elevation 10/06/2016   Bronchitis 10/06/2016   Chronic pain syndrome 10/06/2016   History of non-Hodgkin's lymphoma  04/30/2016   Snoring 04/30/2016   Chronic combined systolic and diastolic congestive heart failure ([place]) 04/05/2016   Essential hypertension 10/09/2015    Past Surgical History:  Procedure Laterality Date   CARDIAC CATHETERIZATION N/A 04/15/2016   Procedure: Right/Left Heart Cath and Coronary Angiography;  Surgeon: Larey Dresser, MD;  Location: [facility];  Service: Cardiovascular;  Laterality: N/A;   HERNIA REPAIR     IR RADIOLOGIST EVAL & MGMT  11/19/2016   IR RADIOLOGIST EVAL & MGMT  11/05/2016   LAPAROSCOPIC ASSISTED SPIGELIAN HERNIA REPAIR N/A 10/18/2016   Procedure: LAPAROSCOPIC REPAIR OF UMBILICAL HERNIA  WITH MESH AND LYSIS OF ADHESIONS.;  Surgeon: [name], MD;  Location: [facility];  Service: General;  Laterality: N/A;   tumor biopsy  03/2002   chest   WISDOM TOOTH EXTRACTION         Family History  Problem Relation Age of Onset   Cancer Mother    Emphysema Mother    Bronchitis Mother     Social History   Tobacco Use   Smoking status: Former    Packs/day: 1.25    Years: 15.00    Pack years: 18.75    Types: Cigarettes    Quit date: 04/08/2007    Years since quitting: 14.3   Smokeless tobacco: Current    Types: Snuff  Vaping Use   Vaping Use:  Never used  Substance Use Topics   Alcohol use: No    Alcohol/week: 0.0 standard drinks   Drug use: Not Currently    Types: Cocaine    Comment: last used yesterday     Home Medications Prior to Admission medications   Medication Sig Start Date End Date Taking? Authorizing Provider  cyclobenzaprine (FLEXERIL) 10 MG tablet Take 1 tablet (10 mg total) by mouth 2 (two) times daily as needed for muscle spasms. 04/02/21   Lucrezia Starch, MD  doxycycline (VIBRAMYCIN) 100 MG capsule Take 1 capsule (100 mg total) by mouth 2 (two) times daily. 05/19/21   Tacy Learn, PA-C  furosemide (LASIX) 40 MG tablet Take 1 tablet (40 mg total) by mouth 2 (two) times daily. 11/09/17   Larey Dresser, MD  gabapentin  (NEURONTIN) 300 MG capsule Take 1 capsule (300 mg total) by mouth 3 (three) times daily. 04/14/21   Quintella Reichert, MD  omeprazole (PRILOSEC) 20 MG capsule Take 1 capsule (20 mg total) by mouth daily. 04/14/21   Quintella Reichert, MD  potassium chloride SA (K-DUR,KLOR-CON) 20 MEQ tablet Take 1 tablet (20 mEq total) by mouth daily. 11/09/17   Larey Dresser, MD  predniSONE (DELTASONE) 10 MG tablet Take 2 tablets (20 mg total) by mouth daily. 05/19/21   Tacy Learn, PA-C  sacubitril-valsartan (ENTRESTO) 49-51 MG Take 1 tablet by mouth daily. 04/14/21   Quintella Reichert, MD    Allergies    Patient has no known allergies.  Review of Systems   Review of Systems  Respiratory:  Positive for shortness of breath.    Physical Exam Updated Vital Signs BP 112/86    Pulse (!) 111    Temp 97.9 F (36.6 C) (Oral)    Resp (!) 27    SpO2 98%   Physical Exam  ED Results / Procedures / Treatments   Labs (all labs ordered are listed, but only abnormal results are displayed) Labs Reviewed  BRAIN NATRIURETIC PEPTIDE - Abnormal; Notable for the following components:      Result Value   B Natriuretic Peptide 449.6 (*)    All other components within normal limits  BASIC METABOLIC PANEL - Abnormal; Notable for the following components:   BUN 22 (*)    Calcium 8.8 (*)    All other components within normal limits  CBC WITH DIFFERENTIAL/PLATELET - Abnormal; Notable for the following components:   Hemoglobin 11.9 (*)    MCH 24.2 (*)    MCHC 29.2 (*)    RDW 19.7 (*)    Platelets 506 (*)    nRBC 0.5 (*)    Abs Immature Granulocytes 0.16 (*)    All other components within normal limits  TROPONIN I (HIGH SENSITIVITY) - Abnormal; Notable for the following components:   Troponin I (High Sensitivity) 23 (*)    All other components within normal limits  RESP PANEL BY RT-PCR (FLU A&B, COVID) ARPGX2  CULTURE, BLOOD (ROUTINE X 2)  CULTURE, BLOOD (ROUTINE X 2)  TSH  T4, FREE  SEDIMENTATION RATE  C-REACTIVE  PROTEIN  LACTIC ACID, PLASMA  LACTIC ACID, PLASMA  TROPONIN I (HIGH SENSITIVITY)    EKG EKG Interpretation  Date/Time:  Saturday August 16 2021 17:51:46 EST Ventricular Rate:  117 PR Interval:  154 QRS Duration: 110 QT Interval:  350 QTC Calculation: 488 R Axis:   65 Text Interpretation: Sinus tachycardia ST & T wave abnormality, consider inferolateral ischemia Abnormal ECG Since last tracing rate faster Confirmed by  Wandra Arthurs [phone]) on 08/16/2021 6:46:34 PM  Radiology DG Chest 2 View  Result Date: 08/16/2021 CLINICAL DATA:  Shortness of breath and leg swelling x2 days. EXAM: CHEST - 2 VIEW COMPARISON:  July 15, 2021 FINDINGS: The heart size and mediastinal contours are within normal limits. Chronic left upper lobe collapse. Low lung volumes with bibasilar atelectasis. No visible pleural effusion or pneumothorax. The visualized skeletal structures are unremarkable. IMPRESSION: Chronic left upper lobe collapse.  No acute cardiopulmonary disease. Electronically Signed   By: Dahlia Bailiff M.D.   On: 08/16/2021 18:44    Procedures Procedures   Medications Ordered in ED Medications  ipratropium-albuterol (DUONEB) 0.5-2.5 (3) MG/3ML nebulizer solution 3 mL (has no administration in time range)  HYDROmorphone (DILAUDID) injection 1 mg (has no administration in time range)  furosemide (LASIX) injection 40 mg (has no administration in time range)    ED Course  I have reviewed the triage vital signs and the nursing notes.  Pertinent labs & imaging results that were available during my care of the patient were reviewed by me and considered in my medical decision making (see chart for details).    MDM Rules/Calculators/A&P                         This patient presents to the ED for concern of SOB and foot pain, this involves an extensive number of treatment options, and is a complaint that carries with it a high risk of complications and morbidity.  The differential diagnosis  includes The emergent differential diagnosis for shortness of breath includes, but is not limited to, Pulmonary edema, bronchoconstriction, Pneumonia, Pulmonary embolism, Pneumotherax/ Hemothorax, Dysrythmia, ACS.  Ddx for foot pain includes fracture, contusion, gout, PAD, venous insufficiency and sprain   Additional history obtained:   External records from outside source obtained and reviewed including recent admissions for acute decompensated CHF and recent ED discharge summaries. Recent labs and imaging for comparison also done   Lab Tests:  I Ordered, reviewed, and interpreted labs.  The pertinent results include: Lactic acid normal, TSH significantly elevated at 18.264, sed rate normal CRP slightly elevated.  Respiratory panel negative, BNP elevated at 449 which is around patient's baseline, CBC without evidence of acute infection, BMP unremarkable EKG with sinus tach   Imaging Studies ordered:  I ordered imaging studies including chest x-ray I independently visualized and interpreted imaging which showed chronic left upper lobe collapse I agree with the radiologist interpretation   Cardiac Monitoring:  The patient was maintained on a cardiac monitor.  I personally viewed and interpreted the cardiac monitored which showed an underlying rhythm of: Sinus tachycardia   Medicines ordered and prescription drug management:  I ordered medication including DuoNeb for shortness of breath; Lasix 40 mg IV for fluid overload, Dilaudid 1 mg for pain Reevaluation of the patient after these medicines showed that the patient improved.  Patient's symptoms overall improved and patient is feeling better on reassessment. I have reviewed the patients home medicines and have made adjustments as needed     Reevaluation:  After the interventions noted above, I reevaluated the patient and found that they have :improved   Dispostion:  After consideration of the diagnostic results and the  patients response to treatment feel that the patent would benefit from discharge with outpatient follow-up.   Shortness of breath secondary to chronic congestive heart failure-patient symptoms were improved upon discharge.  He remained slightly tachycardic, however I believe this is  due to the respiratory demand and uncontrolled CHF.  Given patient's history of CHF as well as the fact that he often bounces back and forth between [facility] and [place] for hospital care, patient has been without a cardiologist, PCP or pulmonologist for management of his CHF.  I discussed with patient that he needs to establish this care so that someone can prescribe and make changes to his medication regimen to help with his symptoms.  Currently, his care has been intermittently being hospitalized without any continuity of care.  Patient understands and is amenable to cardiac and pulmonology referral. History of dental abscess-patient states he recently finished a course of clindamycin but feels that his infection has not completely resolved.  He skipped his recent dental follow-up since they told him that they were going to pull any of his teeth.  I have filled a course of Augmentin for him to take, but I have stressed that he really needs to follow-up with his dentist so that they can turn determine long-term care of his disease.  Patient understands and is amenable to plan. All labs and imaging results were discussed with patient.  All questions he had were asked and answered.  Referrals were placed.  Patient is to continue taking Lasix at home.  Discussed reasons for emergent return back to the ED.  Patient agrees and is amenable to plan.  Discharged home in good condition.    Final Clinical Impression(s) / ED Diagnoses Final diagnoses:  Chronic congestive heart failure, unspecified heart failure type [facility])  H/O dental abscess    Rx / DC Orders ED Discharge Orders          Ordered    amoxicillin-clavulanate  (AUGMENTIN) 875-125 MG tablet  Every 12 hours        08/16/21 2244    naproxen (NAPROSYN) 500 MG tablet  2 times daily        08/16/21 2325             Tonye Pearson, [place] 08/20/21 1900    Drenda Freeze, MD 08/22/21 1200

## 2021-08-16 NOTE — ED Provider Notes (Signed)
Emergency Medicine Provider Triage Evaluation Note  Ronald Londo , a 45 y.o. male  was evaluated in triage.  Pt complains of Hartness of breath.  Patient was just recently admitted on 08/01/2021 for acute on chronic heart failure secondary to chronic cocaine use.  Patient states that since his discharge she is continue to have shortness of breath.  States his heart failure is from cocaine use and the chemotherapy and radiation that he had for non-Hodgkin's lymphoma (in remission).  Endorses lower extremity swelling, cough.  He denies chest pain or palpitations.  Denies sick contacts.  Denies fever.  Review of Systems  Positive: See above Negative:   Physical Exam  BP (!) 131/104 (BP Location: Right Arm)    Pulse (!) 117    Temp 97.9 F (36.6 C) (Oral)    Resp (!) 24    SpO2 100%  Gen:   Awake, in mild to moderate distress Resp:  Tachypnea, bilateral expiratory wheezing, decreased air movement MSK:   Moves extremities without difficulty  Other:  S1/S2 with tachycardia.  Bilateral lower extremities with 1-2+ pitting edema.  Medical Decision Making  Medically screening exam initiated at 6:03 PM.  Appropriate orders placed.  Bertel Venard was informed that the remainder of the evaluation will be completed by another provider, this initial triage assessment does not replace that evaluation, and the importance of remaining in the ED until their evaluation is complete.     Mickie Hillier, PA-C 08/16/21 1805    Wyvonnia Dusky, MD 08/16/21 [phone]

## 2021-08-16 NOTE — ED Triage Notes (Signed)
Pt reports Shob and leg swelling that started 2 days ago. Pt also reports an "infection in my mouth." "I am afraid it got into my blood stream and made my feet infected to."

## 2021-08-20 ENCOUNTER — Emergency Department (HOSPITAL_COMMUNITY)
Admission: EM | Admit: 2021-08-20 | Discharge: 2021-08-21 | Disposition: A | Discharge status: Home / Self Care | Payer: Medicaid Other | Attending: Emergency Medicine | Admitting: Emergency Medicine

## 2021-08-20 ENCOUNTER — Emergency Department (HOSPITAL_COMMUNITY): Payer: Medicaid Other

## 2021-08-20 DIAGNOSIS — Z5321 Procedure and treatment not carried out due to patient leaving prior to being seen by health care provider: Secondary | ICD-10-CM | POA: Insufficient documentation

## 2021-08-20 DIAGNOSIS — R079 Chest pain, unspecified: Secondary | ICD-10-CM | POA: Insufficient documentation

## 2021-08-20 DIAGNOSIS — R0602 Shortness of breath: Secondary | ICD-10-CM | POA: Diagnosis present

## 2021-08-20 NOTE — ED Notes (Signed)
PT DECIDED TO LEAVE AMA

## 2021-08-20 NOTE — ED Triage Notes (Addendum)
Patient arrived with EMS from home reports syncopal episodes today x3 with exertional dyspnea /cough and chest congestion , family reported to EMS that he has been using Cocaine for several days . Takes Lasix for ankles edema .

## 2021-08-20 NOTE — ED Provider Notes (Signed)
Emergency Medicine Provider Triage Evaluation Note  Leonard [name] , a 45 y.o. male  was evaluated in triage.  Pt complains of shortness of breath.  Has history of CHF exacerbation.  EMS reports that he has been using cocaine for the past several days.  He states that he was recently hospitalized in [place] for the same.  Reports that he has had increased swelling on his abdomen and in his lower extremities.  States that he lost consciousness 3 times today, which he attributes to his shortness of breath..  Review of Systems  Positive: SOB, CP Negative: Fever, chills  Physical Exam  BP (!) 131/106 (BP Location: Right Arm)    Pulse (!) 105    Temp 98 F (36.7 C) (Oral)    Resp 18    SpO2 92%  Gen:   Awake, no distress   Resp:  Mildly increased WOB MSK:   Moves extremities without difficulty  Other:  Peripheral edema is noted  Medical Decision Making  Medically screening exam initiated at 10:47 PM.  Appropriate orders placed.  Kedron Uno was informed that the remainder of the evaluation will be completed by another provider, this initial triage assessment does not replace that evaluation, and the importance of remaining in the ED until their evaluation is complete.  Probable CHF exacerbation Hx of cocaine use   Montine Circle, Hershal Coria 08/20/21 2248    Drenda Freeze, MD 08/22/21 [phone]

## 2021-08-21 LAB — CULTURE, BLOOD (ROUTINE X 2)
Culture: NO GROWTH
Special Requests: ADEQUATE

## 2021-08-21 LAB — BASIC METABOLIC PANEL
Anion gap: 10 (ref 5–15)
BUN: 21 mg/dL — ABNORMAL HIGH (ref 6–20)
CO2: 29 mmol/L (ref 22–32)
Calcium: 8.5 mg/dL — ABNORMAL LOW (ref 8.9–10.3)
Chloride: 96 mmol/L — ABNORMAL LOW (ref 98–111)
Creatinine, Ser: 1.21 mg/dL (ref 0.61–1.24)
GFR, Estimated: >60 mL/min (ref >60)
Glucose, Bld: 102 mg/dL — ABNORMAL HIGH (ref 70–99)
Potassium: 3.3 mmol/L — ABNORMAL LOW (ref 3.5–5.1)
Sodium: 135 mmol/L (ref 135–145)

## 2021-08-21 LAB — CBC
HCT: 33.8 % — ABNORMAL LOW (ref 39.0–52.0)
Hemoglobin: 10.4 g/dL — ABNORMAL LOW (ref 13.0–17.0)
MCH: 24.7 pg — ABNORMAL LOW (ref 26.0–34.0)
MCHC: 30.8 g/dL (ref 30.0–36.0)
MCV: 80.3 fL (ref 80.0–100.0)
Platelets: 375 10*3/uL (ref 150–400)
RBC: 4.21 MIL/uL — ABNORMAL LOW (ref 4.22–5.81)
RDW: 19.7 % — ABNORMAL HIGH (ref 11.5–15.5)
WBC: 7.1 10*3/uL (ref 4.0–10.5)
nRBC: 0 % (ref 0.0–0.2)

## 2021-08-21 LAB — TROPONIN I (HIGH SENSITIVITY): Troponin I (High Sensitivity): 127 ng/L (ref <18)

## 2021-08-21 LAB — BRAIN NATRIURETIC PEPTIDE: B Natriuretic Peptide: 956.1 pg/mL — ABNORMAL HIGH (ref 0.0–100.0)

## 2021-08-29 NOTE — Progress Notes (Deleted)
[name] Clinic Note - Initial Visit   Date: 08/29/21  Leonard [name] MRN: [id number] DOB: 1976-02-27   Dear Leonard Cowman, PA-C:  Thank you for your kind referral of Leonard [name] for consultation of ***. Although his history is well known to you, please allow [place] to reiterate it for the purpose of our medical record. The patient was accompanied to the clinic by *** who also provides collateral information.     History of Present Illness: Leonard [name] is a 46 y.o. 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   He takes gabapentin 300mg  TID.   Out-side paper records, electronic medical record, and images have been reviewed where available and summarized as: *** Lab Results  Component Value Date   HGBA1C 5.8 (H) 10/09/2015   Lab Results  Component Value Date   VITAMINB12 485 04/06/2016   Lab Results  Component Value Date   TSH 18.264 (H) 08/16/2021   Lab Results  Component Value Date   ESRSEDRATE 8 08/16/2021    Past Medical History:  Diagnosis Date   AKI (acute kidney injury) ([place]) 11/09/2016   Asthma    CHF (congestive heart failure) ([place]) 04/05/2016   Chronic bronchitis (HCC)    Chronic lower back pain    Cocaine abuse ([place]) 09/03/2017   COPD (chronic obstructive pulmonary disease) ([facility])    GERD (gastroesophageal reflux disease)    Headache    "weekly" (09/03/2017)   History of blood transfusion    "related to CA" (09/03/2017)   Hypertension    Hypothyroidism    Non Hodgkin's lymphoma ([place]) 03/2002   "stage IV"   Non Hodgkin's lymphoma ([place]) 04/2012   "stage II"   Pneumonia    "several times" (09/03/2017)   Sleep apnea    "went to sleep study; didn't have my normal episodes of choking /waking up not able to breathe" (09/03/2017)    Past Surgical History:  Procedure Laterality Date   CARDIAC CATHETERIZATION N/A 04/15/2016    Procedure: Right/Left Heart Cath and Coronary Angiography;  Surgeon: Larey Dresser, MD;  Location: [facility];  Service: Cardiovascular;  Laterality: N/A;   HERNIA REPAIR     IR RADIOLOGIST EVAL & MGMT  11/19/2016   IR RADIOLOGIST EVAL & MGMT  11/05/2016   LAPAROSCOPIC ASSISTED SPIGELIAN HERNIA REPAIR N/A 10/18/2016   Procedure: LAPAROSCOPIC REPAIR OF UMBILICAL HERNIA  WITH MESH AND LYSIS OF ADHESIONS.;  Surgeon: [name], MD;  Location: [facility];  Service: General;  Laterality: N/A;   tumor biopsy  03/2002   chest   WISDOM TOOTH EXTRACTION       Medications:  Outpatient Encounter Medications as of 09/01/2021  Medication Sig Note   amoxicillin-clavulanate (AUGMENTIN) 875-125 MG tablet Take 1 tablet by mouth every 12 (twelve) hours.    cyclobenzaprine (FLEXERIL) 10 MG tablet Take 1 tablet (10 mg total) by mouth 2 (two) times daily as needed for muscle spasms.    furosemide (LASIX) 40 MG tablet Take 1 tablet (40 mg total) by mouth 2 (two) times daily.    gabapentin (NEURONTIN) 300 MG capsule Take 1 capsule (300 mg total) by mouth 3 (three) times daily.    naproxen (NAPROSYN) 500 MG tablet Take 1 tablet (500 mg total) by mouth 2 (two) times daily.    omeprazole (PRILOSEC) 20 MG capsule Take 1 capsule (20 mg total) by mouth daily.    potassium chloride SA (  K-DUR,KLOR-CON) 20 MEQ tablet Take 1 tablet (20 mEq total) by mouth daily. 04/02/2021: Takes but needs refills   predniSONE (DELTASONE) 10 MG tablet Take 2 tablets (20 mg total) by mouth daily.    sacubitril-valsartan (ENTRESTO) 49-51 MG Take 1 tablet by mouth daily.    No facility-administered encounter medications on file as of 09/01/2021.    Allergies: No Known Allergies  Family History: Family History  Problem Relation Age of Onset   Cancer Mother    Emphysema Mother    Bronchitis Mother     Social History: Social History   Tobacco Use   Smoking status: Former    Packs/day: 1.25    Years: 15.00    Pack years:  18.75    Types: Cigarettes    Quit date: 04/08/2007    Years since quitting: 14.4   Smokeless tobacco: Current    Types: Snuff  Vaping Use   Vaping Use: Never used  Substance Use Topics   Alcohol use: No    Alcohol/week: 0.0 standard drinks   Drug use: Not Currently    Types: Cocaine    Comment: last used yesterday    Social History   Social History Narrative   Not on file    Vital Signs:  There were no vitals taken for this visit.   General Medical Exam:  *** General:  Well appearing, comfortable.   Eyes/ENT: see cranial nerve examination.   Neck:   No carotid bruits. Respiratory:  Clear to auscultation, good air entry bilaterally.   Cardiac:  Regular rate and rhythm, no murmur.   Extremities:  No deformities, edema, or skin discoloration.  Skin:  No rashes or lesions.  Neurological Exam: MENTAL STATUS including orientation to time, place, person, recent and remote memory, attention span and concentration, language, and fund of knowledge is ***normal.  Speech is not dysarthric.  CRANIAL NERVES: II:  No visual field defects.  Unremarkable fundi.   III-IV-VI: Pupils equal round and reactive to light.  Normal conjugate, extra-ocular eye movements in all directions of gaze.  No nystagmus.  No ptosis***.   V:  Normal facial sensation.    VII:  Normal facial symmetry and movements.   VIII:  Normal hearing and vestibular function.   IX-X:  Normal palatal movement.   XI:  Normal shoulder shrug and head rotation.   XII:  Normal tongue strength and range of motion, no deviation or fasciculation.  MOTOR:  No atrophy, fasciculations or abnormal movements.  No pronator drift.   Upper Extremity:  Right  Left  Deltoid  5/5   5/5   Biceps  5/5   5/5   Triceps  5/5   5/5   Infraspinatus 5/5  5/5  Medial pectoralis 5/5  5/5  Wrist extensors  5/5   5/5   Wrist flexors  5/5   5/5   Finger extensors  5/5   5/5   Finger flexors  5/5   5/5   Dorsal interossei  5/5   5/5   Abductor  pollicis  5/5   5/5   Tone (Ashworth scale)  0  0   Lower Extremity:  Right  Left  Hip flexors  5/5   5/5   Hip extensors  5/5   5/5   Adductor 5/5  5/5  Abductor 5/5  5/5  Knee flexors  5/5   5/5   Knee extensors  5/5   5/5   Dorsiflexors  5/5   5/5   Plantarflexors  5/5   5/5   Toe extensors  5/5   5/5   Toe flexors  5/5   5/5   Tone (Ashworth scale)  0  0   MSRs:  Right        Left                  brachioradialis 2+  2+  biceps 2+  2+  triceps 2+  2+  patellar 2+  2+  ankle jerk 2+  2+  Hoffman no  no  plantar response down  down   SENSORY:  Normal and symmetric perception of light touch, pinprick, vibration, and proprioception.  Romberg's sign absent.   COORDINATION/GAIT: Normal finger-to- nose-finger and heel-to-shin.  Intact rapid alternating movements bilaterally.  Able to rise from a chair without using arms.  Gait narrow based and stable. Tandem and stressed gait intact.    IMPRESSION: ***  PLAN/RECOMMENDATIONS:  *** Return to clinic in *** months.  Total time spent: ***   Thank you for allowing me to participate in patient's care.  If I can answer any additional questions, I would be pleased to do so.    Sincerely,    Kahlia Lagunes K. Posey Pronto, DO

## 2021-08-31 ENCOUNTER — Other Ambulatory Visit: Payer: Self-pay

## 2021-08-31 ENCOUNTER — Inpatient Hospital Stay (HOSPITAL_COMMUNITY)
Admission: EM | Admit: 2021-08-31 | Discharge: 2021-09-05 | DRG: 286 | Disposition: A | Discharge status: Home / Self Care | Payer: Medicaid Other | Attending: Internal Medicine | Admitting: Internal Medicine

## 2021-08-31 ENCOUNTER — Emergency Department (HOSPITAL_COMMUNITY): Payer: Medicaid Other

## 2021-08-31 DIAGNOSIS — K029 Dental caries, unspecified: Secondary | ICD-10-CM | POA: Diagnosis present

## 2021-08-31 DIAGNOSIS — I5023 Acute on chronic systolic (congestive) heart failure: Secondary | ICD-10-CM | POA: Diagnosis present

## 2021-08-31 DIAGNOSIS — I509 Heart failure, unspecified: Secondary | ICD-10-CM

## 2021-08-31 DIAGNOSIS — J449 Chronic obstructive pulmonary disease, unspecified: Secondary | ICD-10-CM | POA: Diagnosis present

## 2021-08-31 DIAGNOSIS — F1010 Alcohol abuse, uncomplicated: Secondary | ICD-10-CM | POA: Diagnosis present

## 2021-08-31 DIAGNOSIS — I110 Hypertensive heart disease with heart failure: Principal | ICD-10-CM | POA: Diagnosis present

## 2021-08-31 DIAGNOSIS — G4730 Sleep apnea, unspecified: Secondary | ICD-10-CM | POA: Diagnosis present

## 2021-08-31 DIAGNOSIS — G629 Polyneuropathy, unspecified: Secondary | ICD-10-CM | POA: Diagnosis present

## 2021-08-31 DIAGNOSIS — E669 Obesity, unspecified: Secondary | ICD-10-CM | POA: Diagnosis present

## 2021-08-31 DIAGNOSIS — R7989 Other specified abnormal findings of blood chemistry: Secondary | ICD-10-CM | POA: Diagnosis present

## 2021-08-31 DIAGNOSIS — I248 Other forms of acute ischemic heart disease: Secondary | ICD-10-CM | POA: Diagnosis present

## 2021-08-31 DIAGNOSIS — I428 Other cardiomyopathies: Secondary | ICD-10-CM | POA: Diagnosis present

## 2021-08-31 DIAGNOSIS — Z79899 Other long term (current) drug therapy: Secondary | ICD-10-CM

## 2021-08-31 DIAGNOSIS — Z23 Encounter for immunization: Secondary | ICD-10-CM

## 2021-08-31 DIAGNOSIS — I272 Pulmonary hypertension, unspecified: Secondary | ICD-10-CM | POA: Diagnosis present

## 2021-08-31 DIAGNOSIS — Z91199 Patient's noncompliance with other medical treatment and regimen due to unspecified reason: Secondary | ICD-10-CM

## 2021-08-31 DIAGNOSIS — E871 Hypo-osmolality and hyponatremia: Secondary | ICD-10-CM | POA: Diagnosis not present

## 2021-08-31 DIAGNOSIS — I5043 Acute on chronic combined systolic (congestive) and diastolic (congestive) heart failure: Secondary | ICD-10-CM | POA: Diagnosis present

## 2021-08-31 DIAGNOSIS — J690 Pneumonitis due to inhalation of food and vomit: Secondary | ICD-10-CM | POA: Diagnosis not present

## 2021-08-31 DIAGNOSIS — F17220 Nicotine dependence, chewing tobacco, uncomplicated: Secondary | ICD-10-CM | POA: Diagnosis present

## 2021-08-31 DIAGNOSIS — K219 Gastro-esophageal reflux disease without esophagitis: Secondary | ICD-10-CM | POA: Diagnosis present

## 2021-08-31 DIAGNOSIS — F1410 Cocaine abuse, uncomplicated: Secondary | ICD-10-CM | POA: Diagnosis present

## 2021-08-31 DIAGNOSIS — Z9221 Personal history of antineoplastic chemotherapy: Secondary | ICD-10-CM

## 2021-08-31 DIAGNOSIS — D509 Iron deficiency anemia, unspecified: Secondary | ICD-10-CM | POA: Diagnosis present

## 2021-08-31 DIAGNOSIS — I071 Rheumatic tricuspid insufficiency: Secondary | ICD-10-CM | POA: Diagnosis present

## 2021-08-31 DIAGNOSIS — R55 Syncope and collapse: Secondary | ICD-10-CM | POA: Diagnosis present

## 2021-08-31 DIAGNOSIS — Z9114 Patient's other noncompliance with medication regimen: Secondary | ICD-10-CM

## 2021-08-31 DIAGNOSIS — R778 Other specified abnormalities of plasma proteins: Secondary | ICD-10-CM | POA: Diagnosis present

## 2021-08-31 DIAGNOSIS — E876 Hypokalemia: Secondary | ICD-10-CM | POA: Diagnosis not present

## 2021-08-31 DIAGNOSIS — K047 Periapical abscess without sinus: Secondary | ICD-10-CM | POA: Diagnosis present

## 2021-08-31 DIAGNOSIS — D649 Anemia, unspecified: Secondary | ICD-10-CM | POA: Diagnosis not present

## 2021-08-31 DIAGNOSIS — C8590 Non-Hodgkin lymphoma, unspecified, unspecified site: Secondary | ICD-10-CM | POA: Diagnosis present

## 2021-08-31 DIAGNOSIS — Z20822 Contact with and (suspected) exposure to covid-19: Secondary | ICD-10-CM | POA: Diagnosis present

## 2021-08-31 DIAGNOSIS — I2510 Atherosclerotic heart disease of native coronary artery without angina pectoris: Secondary | ICD-10-CM | POA: Diagnosis present

## 2021-08-31 DIAGNOSIS — Z6835 Body mass index (BMI) 35.0-35.9, adult: Secondary | ICD-10-CM

## 2021-08-31 DIAGNOSIS — R000 Tachycardia, unspecified: Secondary | ICD-10-CM | POA: Diagnosis present

## 2021-08-31 DIAGNOSIS — D638 Anemia in other chronic diseases classified elsewhere: Secondary | ICD-10-CM | POA: Diagnosis present

## 2021-08-31 DIAGNOSIS — Z923 Personal history of irradiation: Secondary | ICD-10-CM

## 2021-08-31 DIAGNOSIS — E039 Hypothyroidism, unspecified: Secondary | ICD-10-CM | POA: Diagnosis present

## 2021-08-31 LAB — BASIC METABOLIC PANEL
Anion gap: 8 (ref 5–15)
BUN: 19 mg/dL (ref 6–20)
CO2: 27 mmol/L (ref 22–32)
Calcium: 8.2 mg/dL — ABNORMAL LOW (ref 8.9–10.3)
Chloride: 98 mmol/L (ref 98–111)
Creatinine, Ser: 1 mg/dL (ref 0.61–1.24)
GFR, Estimated: >60 mL/min (ref >60)
Glucose, Bld: 118 mg/dL — ABNORMAL HIGH (ref 70–99)
Potassium: 3.7 mmol/L (ref 3.5–5.1)
Sodium: 133 mmol/L — ABNORMAL LOW (ref 135–145)

## 2021-08-31 LAB — CBC
HCT: 35.7 % — ABNORMAL LOW (ref 39.0–52.0)
Hemoglobin: 10.6 g/dL — ABNORMAL LOW (ref 13.0–17.0)
MCH: 24.7 pg — ABNORMAL LOW (ref 26.0–34.0)
MCHC: 29.7 g/dL — ABNORMAL LOW (ref 30.0–36.0)
MCV: 83.2 fL (ref 80.0–100.0)
Platelets: 308 10*3/uL (ref 150–400)
RBC: 4.29 MIL/uL (ref 4.22–5.81)
RDW: 21.3 % — ABNORMAL HIGH (ref 11.5–15.5)
WBC: 5.6 10*3/uL (ref 4.0–10.5)
nRBC: 0 % (ref 0.0–0.2)

## 2021-08-31 LAB — TROPONIN I (HIGH SENSITIVITY)
Troponin I (High Sensitivity): 51 ng/L — ABNORMAL HIGH (ref <18)
Troponin I (High Sensitivity): 53 ng/L — ABNORMAL HIGH (ref <18)

## 2021-08-31 LAB — BRAIN NATRIURETIC PEPTIDE: B Natriuretic Peptide: 1530.6 pg/mL — ABNORMAL HIGH (ref 0.0–100.0)

## 2021-08-31 MED ORDER — SODIUM CHLORIDE 0.9 % IV SOLN
3.0000 g | Freq: Four times a day (QID) | INTRAVENOUS | Status: DC
  Administered 2021-09-01 (×2): 3 g via INTRAVENOUS
  Filled 2021-08-31 (×3): qty 8

## 2021-08-31 MED ORDER — IPRATROPIUM-ALBUTEROL 0.5-2.5 (3) MG/3ML IN SOLN
3.0000 mL | Freq: Once | RESPIRATORY_TRACT | Status: AC
Start: 2021-08-31 — End: 2021-08-31
  Administered 2021-08-31: 3 mL via RESPIRATORY_TRACT
  Filled 2021-08-31: qty 3

## 2021-08-31 MED ORDER — FUROSEMIDE 10 MG/ML IJ SOLN
40.0000 mg | Freq: Once | INTRAMUSCULAR | Status: AC
  Administered 2021-08-31: 40 mg via INTRAVENOUS
  Filled 2021-08-31: qty 4

## 2021-08-31 NOTE — Progress Notes (Signed)
Pharmacy Antibiotic Note  Medard Decuir is a 46 y.o. male admitted on 08/31/2021 with concern for aspiration pneumonia.  Pharmacy has been consulted for Unasyn dosing.  Plan: Unasyn 3g IV Q6H.  Height: 5\' 11"  (180.3 cm) Weight: 124.3 kg (274 lb) IBW/kg (Calculated) : 75.3  Temp (24hrs), Avg:97.5 F (36.4 C), Min:97.5 F (36.4 C), Max:97.5 F (36.4 C)  Recent Labs  Lab 08/31/21 1921  WBC 5.6  CREATININE 1.00    Estimated Creatinine Clearance: 127.9 mL/min (by C-G formula based on SCr of 1 mg/dL).    No Known Allergies   Thank you for allowing pharmacy to be a part of this patients care.  Wynona Neat, PharmD, BCPS  08/31/2021 11:52 PM
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
## 2021-08-31 NOTE — Assessment & Plan Note (Signed)
Chronic stable make sure has nebulizers in place

## 2021-08-31 NOTE — H&P (Signed)
Princeston Blizzard TMA:[id number] DOB: 05/14/1976 DOA: 08/31/2021     PCP: Secundino Ginger, PA-C   Outpatient Specialists: * NONE      Patient arrived to ER on 08/31/21 at [facility] Referred by Attending No att. providers found   Patient coming from: home Lives With family    Chief Complaint:    Chief Complaint  Patient presents with   Shortness of Breath   Chest Pain    HPI: Leonard [name] is a 46 y.o. 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 Recent seen in the emergency department for CHF exacerbation last month. Has been hospitalized neurology for the same Endorses chest pain gaining 15 pounds Pt says he sucks on [place] when his mouth hurts He does not follow up  states not on his diuretics work well,  not very compliant with with Lasix or Entresto. At [facility] patient would benefit from repeat cardiac catheterization due to reduced EF and mildly elevated Trops. Cardiology would prefer for patient to show compliance prior to proceeding. This can be further addressed in outpatient setting. States he lives now in [place] and wants to establish Cardiology care here  Does not smoke, rare ETOH   Initial COVID TEST  in house  PCR testing  Pending  Lab Results  Component Value Date   [place] 08/16/2021   [place] NEGATIVE 05/19/2021     Regarding pertinent Chronic problems:       chronic CHF diastolic/systolic/ combined - last echo 08/04/2021 1.  Left ventricle: The cavity size is mildly increased. Systolic function is moderately to severely reduced. The estimated ejection fraction is 30-35%. Hypokinesis of the  anterior wall. Hypokinesis of the anteroseptal wall.  2.  Aortic valve: The valve is moderately calcified. Mild aortic stenosis is noted. There is mild regurgitation.  3.  Right ventricle: The cavity size is normal. Systolic function is normal.      Hypothyroidism:  Lab Results  Component Value Date   TSH 18.264 (H) 08/16/2021   Not on  synthroid   COPD - not  followed by pulmonology       Chronic anemia - baseline hg Hemoglobin & Hematocrit  Recent Labs    08/16/21 1810 08/20/21 2252 08/31/21 1921  HGB 11.9* 10.4* 10.6*     While in ER:   Trop 51-53  Given Lasix with good urine output     Prior studies: 08/04/2022 CTA  There is chronic collapse of left upper lobe and a focal obstruction of the left upper lobe bronchus as it crosses anterior to the left main pulmonary artery. Changes have been present and appear similar since previous CT obtained on 04/12/2020. There is a thick band of soft tissue attenuation along the medial apex of the right upper lobe that does not appear to represent vasculature and may represent a mass or collapsed lung parenchyma. There is been no change compared to previous examination performed on 04/12/2020.  2.  There is food debris throughout the esophagus and the stomach is distended with food material. Findings are consistent with severe reflux. Question gastric outlet obstruction. Recommend further evaluation if clinically indicated.  3.  There is a  small volume of fluid in the superior mediastinum which is also been present on previous examination and appear similar.  4.  No pulmonary embolism identified.   today CXR - Patchy airspace disease in the right mid lower lung zone is new from prior exam, suspicious for pneumonia. Small right pleural effusion.    Following Medications were ordered in ER: Medications  furosemide (LASIX) injection 40 mg (has no administration in time range)  ipratropium-albuterol (DUONEB) 0.5-2.5 (3) MG/3ML nebulizer  solution 3 mL (has no administration in time range)       ED Triage Vitals  Enc Vitals Group     BP 08/31/21 1851 (!) 157/111     Pulse Rate 08/31/21 1851 (!) 122     Resp 08/31/21 1851 18     Temp 08/31/21 1851 (!) 97.5 F (36.4 C)     Temp Source 08/31/21 1851 Oral     SpO2 08/31/21 1851 100 %     Weight 08/31/21 1911 274 lb (124.3 kg)     Height 08/31/21 1911 5\' 11"  (1.803 m)     Head Circumference --      Peak Flow --      Pain Score 08/31/21 1911 7     Pain Loc --      Pain Edu? --      Excl. in [place]? --   TMAX(24)@     _________________________________________ Significant initial  Findings: Abnormal Labs Reviewed  BASIC METABOLIC PANEL - Abnormal; Notable for the following components:      Result Value   Sodium 133 (*)    Glucose, Bld 118 (*)    Calcium 8.2 (*)    All other components within normal limits  CBC - Abnormal; Notable for the following components:   Hemoglobin 10.6 (*)    HCT 35.7 (*)    MCH 24.7 (*)    MCHC 29.7 (*)    RDW 21.3 (*)    All other components within normal limits  BRAIN NATRIURETIC PEPTIDE - Abnormal; Notable for the following components:   B Natriuretic Peptide 1,530.6 (*)    All other components within normal limits  TROPONIN I (HIGH SENSITIVITY) - Abnormal; Notable for the following components:   Troponin I (High Sensitivity) 51 (*)    All other components within normal limits     _________________________ Troponin 5150 ECG: Ordered Personally reviewed by me showing: HR : 121 Rhythm:  Sinus tachycardia    nonspecific changes,  QTC 485     The recent clinical data is shown below. Vitals:   08/31/21 1911 08/31/21 2100 08/31/21 2230 08/31/21 2245  BP:  (!) 172/109 (!) 173/111 (!) 156/91  Pulse:  (!) 122 (!) 122 (!) 122  Resp:  15 (!) 24 (!) 27  Temp:      TempSrc:      SpO2:  99% 100% 97%  Weight: 124.3 kg     Height: 5\' 11"  (1.803 m)       WBC     Component Value Date/Time   WBC 5.6 08/31/2021 1921   LYMPHSABS 2.0  08/16/2021 1810   MONOABS 0.9 08/16/2021 1810   EOSABS 0.1 08/16/2021 1810   BASOSABS 0.1 08/16/2021 1810      Procalcitonin   Ordered     _______________________________________________ Hospitalist was called for admission for CHF exacerbation and possible aspiration PNA  The following Work up has been ordered so far:  Orders Placed This Encounter  Procedures   Resp Panel by RT-PCR (Flu A&B,  Covid) Nasopharyngeal Swab   DG Chest 2 View   Basic metabolic panel   CBC   Brain natriuretic peptide   Document Height and Actual Weight   Consult to hospitalist   ED EKG     OTHER Significant initial  Findings:  labs showing:    Recent Labs  Lab 08/31/21 1921  NA 133*  K 3.7  CO2 27  GLUCOSE 118*  BUN 19  CREATININE 1.00  CALCIUM 8.2*    Cr    stable,   Lab Results  Component Value Date   CREATININE 1.00 08/31/2021   CREATININE 1.21 08/20/2021   CREATININE 1.09 08/16/2021    No results for input(s): AST, ALT, ALKPHOS, BILITOT, PROT, ALBUMIN in the last 168 hours. Lab Results  Component Value Date   CALCIUM 8.2 (L) 08/31/2021    Plt: Lab Results  Component Value Date   PLT 308 08/31/2021    Recent Labs  Lab 08/31/21 1921  WBC 5.6  HGB 10.6*  HCT 35.7*  MCV 83.2  PLT 308    HG/HCT   Down from baseline see below    Component Value Date/Time   HGB 10.6 (L) 08/31/2021 1921   HCT 35.7 (L) 08/31/2021 1921   HCT 33.9 (L) 04/06/2016 0106   MCV 83.2 08/31/2021 1921     .car BNP (last 3 results) Recent Labs    08/16/21 1810 08/20/21 2252 08/31/21 1921  BNP 449.6* 956.1* 1,530.6*    Cultures:    Component Value Date/Time   SDES BLOOD RIGHT ANTECUBITAL 08/16/2021 1940   SPECREQUEST  08/16/2021 1940    BOTTLES DRAWN AEROBIC AND ANAEROBIC Blood Culture adequate volume   CULT  08/16/2021 1940    NO GROWTH 5 DAYS Performed at [facility], [place] [address], [place], [place] [address]    REPTSTATUS 08/21/2021 FINAL 08/16/2021 1940      Radiological Exams on Admission: DG Chest 2 View  Result Date: 08/31/2021 CLINICAL DATA:  Chest pain.  Shortness of breath. EXAM: CHEST - 2 VIEW COMPARISON:  08/20/2021, priors reviewed.  Chest CT 01/14/2018 FINDINGS: Upper normal heart size. Stable mediastinal contours with left suprahilar scarring. Patchy airspace disease in the right mid lower lung zone, progressed from prior exam. Left lung base opacities are improved. Small right pleural effusion with fluid in the fissures. No pneumothorax. No convincing pulmonary edema. Stable osseous structures. IMPRESSION: Patchy airspace disease in the right mid lower lung zone is new from prior exam, suspicious for pneumonia. Small right pleural effusion. Electronically Signed   By: Keith Rake M.D.   On: 08/31/2021 19:44   _______________________________________________________________________________________________________ Latest  Blood pressure (!) 156/91, pulse (!) 122, temperature (!) 97.5 F (36.4 C), temperature source Oral, resp. rate (!) 27, height 5\' 11"  (1.803 m), weight 124.3 kg, SpO2 97 %.   Vitals  labs and radiology finding personally reviewed  Review of Systems:    Pertinent positives include:  fatigue, weight gain  shortness of breath at rest.dyspnea on exertion,  Constitutional:  No weight loss, night sweats, Fevers, chills,  HEENT:  No headaches, Difficulty swallowing,Tooth/dental problems,Sore throat,  No sneezing, itching, ear ache, nasal congestion, post nasal drip,  Cardio-vascular:  No chest pain, Orthopnea, PND, anasarca, dizziness, palpitations.no Bilateral lower extremity swelling  GI:  No heartburn, indigestion, abdominal pain, nausea, vomiting, diarrhea, change in bowel habits, loss of appetite, melena, blood in stool, hematemesis Resp:    No excess mucus, no productive cough, No non-productive cough, No coughing up of blood.No  change in color of mucus.No wheezing. Skin:  no rash or lesions. No jaundice GU:  no  dysuria, change in color of urine, no urgency or frequency. No straining to urinate.  No flank pain.  Musculoskeletal:  No joint pain or no joint swelling. No decreased range of motion. No back pain.  Psych:  No change in mood or affect. No depression or anxiety. No memory loss.  Neuro: no localizing neurological complaints, no tingling, no weakness, no double vision, no gait abnormality, no slurred speech, no confusion  All systems reviewed and apart from [place] all are negative _______________________________________________________________________________________________ Past Medical History:   Past Medical History:  Diagnosis Date   AKI (acute kidney injury) ([place]) 11/09/2016   Asthma    CHF (congestive heart failure) ([place]) 04/05/2016   Chronic bronchitis (HCC)    Chronic lower back pain    Cocaine abuse ([place]) 09/03/2017   COPD (chronic obstructive pulmonary disease) ([place])    GERD (gastroesophageal reflux disease)    Headache    "weekly" (09/03/2017)   History of blood transfusion    "related to CA" (09/03/2017)   Hypertension    Hypothyroidism    Non Hodgkin's lymphoma ([place]) 03/2002   "stage IV"   Non Hodgkin's lymphoma ([place]) 04/2012   "stage II"   Pneumonia    "several times" (09/03/2017)   Sleep apnea    "went to sleep study; didn't have my normal episodes of choking /waking up not able to breathe" (09/03/2017)      Past Surgical History:  Procedure Laterality Date   CARDIAC CATHETERIZATION N/A 04/15/2016   Procedure: Right/Left Heart Cath and Coronary Angiography;  Surgeon: Larey Dresser, MD;  Location: [facility];  Service: Cardiovascular;  Laterality: N/A;   HERNIA REPAIR     IR RADIOLOGIST EVAL & MGMT  11/19/2016   IR RADIOLOGIST EVAL & MGMT  11/05/2016   LAPAROSCOPIC ASSISTED SPIGELIAN HERNIA REPAIR N/A 10/18/2016   Procedure: LAPAROSCOPIC REPAIR OF UMBILICAL HERNIA  WITH MESH AND LYSIS OF ADHESIONS.;  Surgeon: [name], MD;  Location: [facility];   Service: General;  Laterality: N/A;   tumor biopsy  03/2002   chest   WISDOM TOOTH EXTRACTION      Social History:  Ambulatory   independently      reports that he quit smoking about 14 years ago. His smoking use included cigarettes. He has a 18.75 pack-year smoking history. His smokeless tobacco use includes snuff. He reports that he does not currently use drugs after having used the following drugs: Cocaine. He reports that he does not drink alcohol.   Family History:   Family History  Problem Relation Age of Onset   Cancer Mother    Emphysema Mother    Bronchitis Mother    ______________________________________________________________________________________________ Allergies: No Known Allergies   Prior to Admission medications   Medication Sig Start Date End Date Taking? Authorizing Provider  amoxicillin-clavulanate (AUGMENTIN) 875-125 MG tablet Take 1 tablet by mouth every 12 (twelve) hours. 08/16/21   Tonye Pearson, PA-C  cyclobenzaprine (FLEXERIL) 10 MG tablet Take 1 tablet (10 mg total) by mouth 2 (two) times daily as needed for muscle spasms. 04/02/21   Lucrezia Starch, MD  furosemide (LASIX) 40 MG tablet Take 1 tablet (40 mg total) by mouth 2 (two) times daily. 11/09/17   Larey Dresser, MD  gabapentin (NEURONTIN) 300 MG capsule Take 1 capsule (300 mg total) by mouth 3 (three) times daily. 04/14/21   Quintella Reichert, MD  naproxen (NAPROSYN) 500 MG tablet Take 1 tablet (500 mg total) by mouth 2 (two) times daily. 08/16/21 09/15/21  Tonye Pearson, PA-C  omeprazole (PRILOSEC) 20 MG capsule Take 1 capsule (20 mg total) by mouth daily. 04/14/21   Quintella Reichert, MD  potassium chloride SA (K-DUR,KLOR-CON) 20 MEQ tablet Take 1 tablet (20 mEq total) by mouth daily. 11/09/17   Larey Dresser, MD  predniSONE (DELTASONE) 10 MG tablet Take 2 tablets (20 mg total) by mouth daily. 05/19/21   Tacy Learn, PA-C  sacubitril-valsartan (ENTRESTO) 49-51 MG Take 1 tablet by mouth  daily. 04/14/21   Quintella Reichert, MD    ___________________________________________________________________________________________________ Physical Exam: Vitals with BMI 08/31/2021 08/31/2021 08/31/2021  Height - - -  Weight - - -  BMI - - -  Systolic 027 741 287  Diastolic 91 867 672  Pulse 122 122 122     1. General:  in No  Acute distress   Chronically ill   -appearing 2. Psychological: Alert and  Oriented 3. Head/ENT:   Moist  Mucous Membranes                          Head Non traumatic, neck supple                           Poor Dentition 4. SKIN: normal  Skin turgor,  Skin clean Dry and intact no rash 5. Heart: Regular rate and rhythm no  Murmur, no Rub or gallop 6. Lungs: no wheezes or crackles   7. Abdomen: Soft,  non-tender, Non distended  bowel sounds present 8. Lower extremities: no clubbing, cyanosis, 1+edema 9. Neurologically Grossly intact, moving all 4 extremities equally   10. MSK: Normal range of motion    Chart has been reviewed  ______________________________________________________________________________________________  Assessment/Plan   46 y.o. male with medical history significant of chronic combined systolic diastolic CHF EF 09-47%.cocaine abuse, cOPD, elevatd troponin non-Hodgkin's lymphoma in remission and status post chemo and radiation last dose in 2014      Admitted for CHF exacerbation and possible aspiration PNA  Present on Admission:  Acute on chronic combined systolic and diastolic CHF (congestive heart failure) (HCC)     Acute on chronic combined systolic and diastolic CHF (congestive heart failure) ([place]) - Pt diagnosed with CHF based on presence of the following rales on exam Pulmonary edema on CXR, and   bilateral leg edema   admit on telemetry,  cycle cardiac enzymes, Troponin 51   obtain serial ECG  to evaluate for ischemia as a cause of heart failure  monitor daily weight:  Filed Weights   08/31/21 1911  Weight: 124.3 kg    Last BNP BNP (last 3 results) Recent Labs    08/16/21 1810 08/20/21 2252 08/31/21 1921  BNP 449.6* 956.1* 1,530.6*      diurese with IV lasix and monitor orthostatics and creatinine to avoid over diuresis.  Order echogram to evaluate EF and valves         Other plan as per orders.  DVT prophylaxis:  SCD     Code Status:    Code Status: Prior FULL CODE  as per patient   I had personally discussed CODE STATUS with patient     Family Communication:   Family not at  Bedside    Disposition Plan:    To home once workup is complete and patient is stable  Following barriers for discharge:                           able to transition to PO antibiotics                                                         Will need consultants to evaluate patient prior to discharge                     Transition of care consulted                              Consults called:    emailed Cardiology   Admission status:  ED Disposition     ED Disposition  [place]: [place] [[id number]]  Level of Care: Telemetry Cardiac [[id number]]  May place patient in observation at [facility] or [place] if equivalent level of care is available:: No  Covid Evaluation: Asymptomatic Screening Protocol (No Symptoms)  Diagnosis: CHF exacerbation [facility]) [[id number]]  Admitting Physician: Toy Baker [[id number]]  Attending Physician: Toy Baker [[id number]]           Obs     Level of care     tele  For 12H     Lab Results  Component Value Date   [place] 08/16/2021     Precautions: admitted as  asymptomatic screening protocol     [place] Jenifer 09/01/2021, 2:25 AM    [facility]     after 2 AM please page floor coverage PA If 7AM-7PM, please contact the day team taking care of the patient using [email or web address]   Patient was evaluated in the context of the global COVID-19 pandemic, which necessitated consideration  that the patient might be at risk for infection with the SARS-CoV-2 virus that causes COVID-19. Institutional protocols and algorithms that pertain to the evaluation of patients at risk for COVID-19 are in a state of rapid change based on information released by regulatory bodies including the CDC and federal and state organizations. These policies and algorithms were followed during the patient's care.

## 2021-08-31 NOTE — Assessment & Plan Note (Signed)
Obtain anemia panel expect dilutional continue to follow

## 2021-08-31 NOTE — ED Triage Notes (Signed)
Pt c/o shortness of breath and chest pain. Pt reports a history of CHF. Pt reports he has put on over 15 pounds in the past 4 days.

## 2021-08-31 NOTE — ED Notes (Signed)
Called pt x4 for triage, no response.

## 2021-08-31 NOTE — ED Provider Triage Note (Signed)
Emergency Medicine Provider Triage Evaluation Note  Leonard [name] , a 46 y.o. male  was evaluated in triage.  Pt complains of chest pain, weakness, SOB, worsening heart failure. Orthopnea. Recently hospitalized [facility] for 10 days. Reports 15-20%EF, 23 lbs weight gain over the last 4 days. On multiple diuretics, none are working adequately. Hx of elevated HR. No NVD.  Review of Systems  Positive: As above Negative: As above  Physical Exam  BP (!) 157/111 (BP Location: Right Arm)    Pulse (!) 122    Temp (!) 97.5 F (36.4 C) (Oral)    Resp 18    Ht 5\' 11"  (1.803 m)    Wt 124.3 kg    SpO2 100%    BMI 38.22 kg/m  Gen:   Awake, no distress   Resp:  Normal effort  MSK:   Moves extremities without difficulty  Other:  2+ pitting edema bilaterally  Medical Decision Making  Medically screening exam initiated at 7:16 PM.  Appropriate orders placed.  Leonard [name] was informed that the remainder of the evaluation will be completed by another provider, this initial triage assessment does not replace that evaluation, and the importance of remaining in the ED until their evaluation is complete.  Workup initiated   Anselmo Pickler, [place] 08/31/21 1920

## 2021-08-31 NOTE — H&P (Incomplete)
Leonard [name] NGE:[id number] DOB: 12-15-1975 DOA: 08/31/2021     PCP: Secundino Ginger, PA-C   Outpatient Specialists: * NONE CARDS: * Dr. NEphrology: *  Dr. NEurology *   Dr. Pulmonary *  Dr.  Oncology * Dr. Fabienne Bruns* Dr.  Sadie Haber, [facility]) No care team member to display Urology Dr. *  Patient arrived to ER on 08/31/21 at [facility] Referred by Attending No att. providers found   Patient coming from: home Lives alone,   *** With family From facility ***  Chief Complaint:   Chief Complaint  Patient presents with   Shortness of Breath   Chest Pain    HPI: Leonard [name] is a 46 y.o. male with medical history significant of ***     Presented with  shortness of breath and chest pain.  No new subjective & objective note has been filed under this hospital service since the last note was generated.      Has ***NOt been vaccinated against COVID **and boosted had *** no flu shot   Initial COVID TEST  NEGATIVE**** POSITIVE,  ***in house  PCR testing  Pending  Lab Results  Component Value Date   [place] NEGATIVE 08/16/2021   [place] NEGATIVE 05/19/2021     Regarding pertinent Chronic problems: ***  ****Hyperlipidemia - *on statins Lipid Panel     Component Value Date/Time   CHOL 131 04/06/2016 0106   TRIG 127 04/06/2016 0106   HDL 24 (L) 04/06/2016 0106   CHOLHDL 5.5 04/06/2016 0106   VLDL 25 04/06/2016 0106   LDLCALC 82 04/06/2016 0106    ***HTN on   ***chronic CHF diastolic/systolic/ combined - last echo***  *** CAD  - On Aspirin, statin, betablocker, Plavix                 - *followed by cardiology                - last cardiac cath  The ASCVD Risk score (Arnett DK, et al., 2019) failed to calculate for the following reasons:   Cannot find a previous HDL lab   Cannot find a previous total cholesterol lab    ***DM 2 -  Lab Results  Component Value Date   HGBA1C 5.8 (H) 10/09/2015   ****on insulin, PO meds only, diet controlled  ***Hypothyroidism:  Lab  Results  Component Value Date   TSH 18.264 (H) 08/16/2021   on synthroid  *** Morbid obesity-   BMI Readings from Last 1 Encounters:  08/31/21 38.22 kg/m     *** Asthma -well *** controlled on home inhalers/ nebs f                        ***last no prior***admission  ***                       No ***history of intubation  *** COPD - not **followed by pulmonology *** not  on baseline oxygen  *L,    *** OSA -on nocturnal oxygen, *CPAP, *noncompliant with CPAP  *** Hx of CVA - *with/out residual deficits on Aspirin 81 mg, 325, Plavix  ***A. Fib -  - CHA2DS2 vas score **** CHA2DS2/VAS Stroke Risk Points      N/A >= 2 Points: High Risk  1 - 1.99 Points: Medium Risk  0 Points: Low Risk    Last Change: N/A      This score determines the patient's risk of having a  stroke if the  patient has atrial fibrillation.      This score is not applicable to this patient. Components are not  calculated.     current  on anticoagulation with ****Coumadin  ***Xarelto,* Eliquis,  *** Not on anticoagulation secondary to Risk of Falls, *** recurrent bleeding         -  Rate control:  Currently controlled with ***Toprolol,  *Metoprolol,* Diltiazem, *Coreg          - Rhythm control: *** amiodarone, *flecainide  ***Hx of DVT/PE on - anticoagulation with ****Coumadin  ***Xarelto,* Eliquis,   ***CKD stage III*- baseline Cr **** Estimated Creatinine Clearance: 127.9 mL/min (by C-G formula based on SCr of 1 mg/dL).  Lab Results  Component Value Date   CREATININE 1.00 08/31/2021   CREATININE 1.21 08/20/2021   CREATININE 1.09 08/16/2021     **** Liver disease Computed MELD-Na score unavailable. Necessary lab results were not found in the last year. Computed MELD score unavailable. Necessary lab results were not found in the last year.   ***BPH - on Flomax, Proscar    *** Dementia - on Aricept** Nemenda  *** Chronic anemia - baseline hg Hemoglobin & Hematocrit  Recent Labs    08/16/21 1810  08/20/21 2252 08/31/21 1921  HGB 11.9* 10.4* 10.6*     While in ER:       Ordered  CT HEAD *** NON acute  CXR - ***NON acute  CTabd/pelvis - ***nonacute  CTA chest - ***nonacute, no PE, * no evidence of infiltrate  Following Medications were ordered in ER: Medications  furosemide (LASIX) injection 40 mg (has no administration in time range)  ipratropium-albuterol (DUONEB) 0.5-2.5 (3) MG/3ML nebulizer solution 3 mL (has no administration in time range)    _______________________________________________________ ER Provider Called:     DrMarland Kitchen  They Recommend admit to medicine *** Will see in AM  ***SEEN in ER   ED Triage Vitals  Enc Vitals Group     BP 08/31/21 1851 (!) 157/111     Pulse Rate 08/31/21 1851 (!) 122     Resp 08/31/21 1851 18     Temp 08/31/21 1851 (!) 97.5 F (36.4 C)     Temp Source 08/31/21 1851 Oral     SpO2 08/31/21 1851 100 %     Weight 08/31/21 1911 274 lb (124.3 kg)     Height 08/31/21 1911 5\' 11"  (1.803 m)     Head Circumference --      Peak Flow --      Pain Score 08/31/21 1911 7     Pain Loc --      Pain Edu? --      Excl. in [place]? --   TMAX(24)@     _________________________________________ Significant initial  Findings: Abnormal Labs Reviewed  BASIC METABOLIC PANEL - Abnormal; Notable for the following components:      Result Value   Sodium 133 (*)    Glucose, Bld 118 (*)    Calcium 8.2 (*)    All other components within normal limits  CBC - Abnormal; Notable for the following components:   Hemoglobin 10.6 (*)    HCT 35.7 (*)    MCH 24.7 (*)    MCHC 29.7 (*)    RDW 21.3 (*)    All other components within normal limits  BRAIN NATRIURETIC PEPTIDE - Abnormal; Notable for the following components:   B Natriuretic Peptide 1,530.6 (*)    All other components within normal limits  TROPONIN  I (HIGH SENSITIVITY) - Abnormal; Notable for the following components:   Troponin I (High Sensitivity) 51 (*)    All other components within  normal limits     _________________________ Troponin ***ordered ECG: Ordered Personally reviewed by me showing: HR : *** Rhythm: *NSR, Sinus tachycardia * A.fib. W RVR, RBBB, LBBB, Paced Ischemic changes*nonspecific changes, no evidence of ischemic changes QTC*   ____________________ This patient meets SIRS Criteria and may be septic. SIRS = Systemic Inflammatory Response Syndrome  Order a lactic acid level if needed AND/OR Initiate the sepsis protocol with the attached order set OR Click "Treating Associated Infection or Illness" if the patient is being treated for an infection that is a known cause of these abnormalities     The recent clinical data is shown below. Vitals:   08/31/21 1911 08/31/21 2100 08/31/21 2230 08/31/21 2245  BP:  (!) 172/109 (!) 173/111 (!) 156/91  Pulse:  (!) 122 (!) 122 (!) 122  Resp:  15 (!) 24 (!) 27  Temp:      TempSrc:      SpO2:  99% 100% 97%  Weight: 124.3 kg     Height: 5\' 11"  (1.803 m)           WBC     Component Value Date/Time   WBC 5.6 08/31/2021 1921   LYMPHSABS 2.0 08/16/2021 1810   MONOABS 0.9 08/16/2021 1810   EOSABS 0.1 08/16/2021 1810   BASOSABS 0.1 08/16/2021 1810        Lactic Acid, Venous    Component Value Date/Time   LATICACIDVEN 1.4 08/16/2021 1940     Procalcitonin *** Ordered Lactic Acid, Venous    Component Value Date/Time   LATICACIDVEN 1.4 08/16/2021 1940     Procalcitonin *** Ordered   UA *** no evidence of UTI  ***Pending ***not ordered   Urine analysis:    Component Value Date/Time   COLORURINE YELLOW 04/14/2021 0025   APPEARANCEUR CLEAR 04/14/2021 0025   LABSPEC 1.023 04/14/2021 0025   PHURINE 5.0 04/14/2021 0025   GLUCOSEU NEGATIVE 04/14/2021 0025   HGBUR NEGATIVE 04/14/2021 0025   BILIRUBINUR NEGATIVE 04/14/2021 0025   KETONESUR NEGATIVE 04/14/2021 0025   PROTEINUR 30 (A) 04/14/2021 0025   NITRITE NEGATIVE 04/14/2021 0025   LEUKOCYTESUR NEGATIVE 04/14/2021 0025    Results  for orders placed or performed during the hospital encounter of 08/16/21  Resp Panel by RT-PCR (Flu A&B, Covid) Nasopharyngeal Swab     Status: None   Collection Time: 08/16/21  6:40 PM   Specimen: Nasopharyngeal Swab; Nasopharyngeal(NP) swabs in vial transport medium  Result Value Ref Range Status   SARS Coronavirus 2 by RT PCR NEGATIVE NEGATIVE Final    Comment: (NOTE) SARS-CoV-2 target nucleic acids are NOT DETECTED.  The SARS-CoV-2 RNA is generally detectable in upper respiratory specimens during the acute phase of infection. The lowest concentration of SARS-CoV-2 viral copies this assay can detect is 138 copies/mL. A negative result does not preclude SARS-Cov-2 infection and should not be used as the sole basis for treatment or other patient management decisions. A negative result may occur with  improper specimen collection/handling, submission of specimen other than nasopharyngeal swab, presence of viral mutation(s) within the areas targeted by this assay, and inadequate number of viral copies(<138 copies/mL). A negative result must be combined with clinical observations, patient history, and epidemiological information. The expected result is Negative.  Fact Sheet for Patients:  EntrepreneurPulse.com.au  Fact Sheet for Healthcare Providers:  IncredibleEmployment.be  This test is  no t yet approved or cleared by the [place] and  has been authorized for detection and/or diagnosis of SARS-CoV-2 by FDA under an Emergency Use Authorization (EUA). This EUA will remain  in effect (meaning this test can be used) for the duration of the COVID-19 declaration under Section 564(b)(1) of the Act, 21 U.S.C.section 360bbb-3(b)(1), unless the authorization is terminated  or revoked sooner.       Influenza A by PCR NEGATIVE NEGATIVE Final   Influenza B by PCR NEGATIVE NEGATIVE Final    Comment: (NOTE) The Xpert Xpress SARS-CoV-2/FLU/RSV plus  assay is intended as an aid in the diagnosis of influenza from Nasopharyngeal swab specimens and should not be used as a sole basis for treatment. Nasal washings and aspirates are unacceptable for Xpert Xpress SARS-CoV-2/FLU/RSV testing.  Fact Sheet for Patients: EntrepreneurPulse.com.au  Fact Sheet for Healthcare Providers: IncredibleEmployment.be  This test is not yet approved or cleared by the [place] FDA and has been authorized for detection and/or diagnosis of SARS-CoV-2 by FDA under an Emergency Use Authorization (EUA). This EUA will remain in effect (meaning this test can be used) for the duration of the COVID-19 declaration under Section 564(b)(1) of the Act, 21 U.S.C. section 360bbb-3(b)(1), unless the authorization is terminated or revoked.  Performed at [facility], [place] [address], [place], [place] [address]   Blood culture (routine x 2)     Status: None   Collection Time: 08/16/21  7:40 PM   Specimen: BLOOD  Result Value Ref Range Status   Specimen Description BLOOD RIGHT ANTECUBITAL  Final   Special Requests   Final    BOTTLES DRAWN AEROBIC AND ANAEROBIC Blood Culture adequate volume   Culture   Final    NO GROWTH 5 DAYS Performed at [facility], [place] [address] [place], [place] [address]    Report Status 08/21/2021 FINAL  Final     _______________________________________________ Hospitalist was called for admission for ***  The following Work up has been ordered so far:  Orders Placed This Encounter  Procedures   Resp Panel by RT-PCR (Flu A&B, Covid) Nasopharyngeal Swab   DG Chest 2 View   Basic metabolic panel   CBC   Brain natriuretic peptide   Document Height and Actual Weight   Consult to hospitalist   ED EKG     OTHER Significant initial  Findings:  labs showing:    Recent Labs  Lab 08/31/21 1921  NA 133*  K 3.7  CO2 27  GLUCOSE 118*  BUN 19  CREATININE 1.00  CALCIUM 8.2*     Cr  * stable,  Up from baseline see below Lab Results  Component Value Date   CREATININE 1.00 08/31/2021   CREATININE 1.21 08/20/2021   CREATININE 1.09 08/16/2021    No results for input(s): AST, ALT, ALKPHOS, BILITOT, PROT, ALBUMIN in the last 168 hours. Lab Results  Component Value Date   CALCIUM 8.2 (L) 08/31/2021          Plt: Lab Results  Component Value Date   PLT 308 08/31/2021       COVID-19 Labs  No results for input(s): DDIMER, FERRITIN, LDH, CRP in the last 72 hours.  Lab Results  Component Value Date   SARSCOV2NAA NEGATIVE 08/16/2021   [place] NEGATIVE 05/19/2021     Arterial ***Venous  Blood Gas result:  pH *** pCO2 ***; pO2 ***;     %O2 Sat ***.  ABG    Component Value  Date/Time   PHART 7.477 (H) 10/23/2016 2108   PCO2ART 34.8 10/23/2016 2108   PO2ART 73.0 (L) 10/23/2016 2108   HCO3 25.0 10/23/2016 2108   TCO2 27 01/13/2017 1058   O2SAT 93.8 10/23/2016 2108         Recent Labs  Lab 08/31/21 1921  WBC 5.6  HGB 10.6*  HCT 35.7*  MCV 83.2  PLT 308    HG/HCT * stable,  Down *Up from baseline see below    Component Value Date/Time   HGB 10.6 (L) 08/31/2021 1921   HCT 35.7 (L) 08/31/2021 1921   HCT 33.9 (L) 04/06/2016 0106   MCV 83.2 08/31/2021 1921      No results for input(s): LIPASE, AMYLASE in the last 168 hours. No results for input(s): AMMONIA in the last 168 hours.    Cardiac Panel (last 3 results) No results for input(s): CKTOTAL, CKMB, TROPONINI, RELINDX in the last 72 hours.  .car BNP (last 3 results) Recent Labs    08/16/21 1810 08/20/21 2252 08/31/21 1921  BNP 449.6* 956.1* 1,530.6*      DM  labs:  HbA1C: No results for input(s): HGBA1C in the last 8760 hours.     CBG (last 3)  No results for input(s): GLUCAP in the last 72 hours.        Cultures:    Component Value Date/Time   SDES BLOOD RIGHT ANTECUBITAL 08/16/2021 1940   SPECREQUEST  08/16/2021 1940    BOTTLES DRAWN AEROBIC AND  ANAEROBIC Blood Culture adequate volume   CULT  08/16/2021 1940    NO GROWTH 5 DAYS Performed at [facility], [place] [address] [place], [place] [address]    REPTSTATUS 08/21/2021 FINAL 08/16/2021 1940     Radiological Exams on Admission: DG Chest 2 View  Result Date: 08/31/2021 CLINICAL DATA:  Chest pain.  Shortness of breath. EXAM: CHEST - 2 VIEW COMPARISON:  08/20/2021, priors reviewed.  Chest CT 01/14/2018 FINDINGS: Upper normal heart size. Stable mediastinal contours with left suprahilar scarring. Patchy airspace disease in the right mid lower lung zone, progressed from prior exam. Left lung base opacities are improved. Small right pleural effusion with fluid in the fissures. No pneumothorax. No convincing pulmonary edema. Stable osseous structures. IMPRESSION: Patchy airspace disease in the right mid lower lung zone is new from prior exam, suspicious for pneumonia. Small right pleural effusion. Electronically Signed   By: Keith Rake M.D.   On: 08/31/2021 19:44   _______________________________________________________________________________________________________ Latest  Blood pressure (!) 156/91, pulse (!) 122, temperature (!) 97.5 F (36.4 C), temperature source Oral, resp. rate (!) 27, height 5\' 11"  (1.803 m), weight 124.3 kg, SpO2 97 %.   Vitals  labs and radiology finding personally reviewed  Review of Systems:    Pertinent positives include: ***  Constitutional:  No weight loss, night sweats, Fevers, chills, fatigue, weight loss  HEENT:  No headaches, Difficulty swallowing,Tooth/dental problems,Sore throat,  No sneezing, itching, ear ache, nasal congestion, post nasal drip,  Cardio-vascular:  No chest pain, Orthopnea, PND, anasarca, dizziness, palpitations.no Bilateral lower extremity swelling  GI:  No heartburn, indigestion, abdominal pain, nausea, vomiting, diarrhea, change in bowel habits, loss of appetite, melena, blood in stool, hematemesis Resp:  no  shortness of breath at rest. No dyspnea on exertion, No excess mucus, no productive cough, No non-productive cough, No coughing up of blood.No change in color of mucus.No wheezing. Skin:  no rash or lesions. No jaundice GU:  no dysuria, change in color of urine,  no urgency or frequency. No straining to urinate.  No flank pain.  Musculoskeletal:  No joint pain or no joint swelling. No decreased range of motion. No back pain.  Psych:  No change in mood or affect. No depression or anxiety. No memory loss.  Neuro: no localizing neurological complaints, no tingling, no weakness, no double vision, no gait abnormality, no slurred speech, no confusion  All systems reviewed and apart from [place] all are negative _______________________________________________________________________________________________ Past Medical History:   Past Medical History:  Diagnosis Date   AKI (acute kidney injury) ([facility]) 11/09/2016   Asthma    CHF (congestive heart failure) ([facility]) 04/05/2016   Chronic bronchitis ([facility])    Chronic lower back pain    Cocaine abuse ([place]) 09/03/2017   COPD (chronic obstructive pulmonary disease) ([facility])    GERD (gastroesophageal reflux disease)    Headache    "weekly" (09/03/2017)   History of blood transfusion    "related to CA" (09/03/2017)   Hypertension    Hypothyroidism    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"   Non Hodgkin's lymphoma ([place]) 04/2012   "stage II"   Pneumonia    "several times" (09/03/2017)   Sleep apnea    "went to sleep study; didn't have my normal episodes of choking /waking up not able to breathe" (09/03/2017)      Past Surgical History:  Procedure Laterality Date   CARDIAC CATHETERIZATION N/A 04/15/2016   Procedure: Right/Left Heart Cath and Coronary Angiography;  Surgeon: Larey Dresser, MD;  Location: [facility];  Service: Cardiovascular;  Laterality: N/A;   HERNIA REPAIR     IR RADIOLOGIST EVAL & MGMT  11/19/2016   IR RADIOLOGIST EVAL  & MGMT  11/05/2016   LAPAROSCOPIC ASSISTED SPIGELIAN HERNIA REPAIR N/A 10/18/2016   Procedure: LAPAROSCOPIC REPAIR OF UMBILICAL HERNIA  WITH MESH AND LYSIS OF ADHESIONS.;  Surgeon: [name], MD;  Location: [facility];  Service: General;  Laterality: N/A;   tumor biopsy  03/2002   chest   WISDOM TOOTH EXTRACTION      Social History:  Ambulatory *** independently cane, walker  wheelchair bound, bed bound     reports that he quit smoking about 14 years ago. His smoking use included cigarettes. He has a 18.75 pack-year smoking history. His smokeless tobacco use includes snuff. He reports that he does not currently use drugs after having used the following drugs: Cocaine. He reports that he does not drink alcohol.     Family History: *** Family History  Problem Relation Age of Onset   Cancer Mother    Emphysema Mother    Bronchitis Mother    ______________________________________________________________________________________________ Allergies: No Known Allergies   Prior to Admission medications   Medication Sig Start Date End Date Taking? Authorizing Provider  amoxicillin-clavulanate (AUGMENTIN) 875-125 MG tablet Take 1 tablet by mouth every 12 (twelve) hours. 08/16/21   Tonye Pearson, PA-C  cyclobenzaprine (FLEXERIL) 10 MG tablet Take 1 tablet (10 mg total) by mouth 2 (two) times daily as needed for muscle spasms. 04/02/21   Lucrezia Starch, MD  furosemide (LASIX) 40 MG tablet Take 1 tablet (40 mg total) by mouth 2 (two) times daily. 11/09/17   Larey Dresser, MD  gabapentin (NEURONTIN) 300 MG capsule Take 1 capsule (300 mg total) by mouth 3 (three) times daily. 04/14/21   Quintella Reichert, MD  naproxen (NAPROSYN) 500 MG tablet Take 1 tablet (500 mg total) by mouth 2 (two) times daily.  08/16/21 09/15/21  Tonye Pearson, PA-C  omeprazole (PRILOSEC) 20 MG capsule Take 1 capsule (20 mg total) by mouth daily. 04/14/21   Quintella Reichert, MD  potassium chloride SA  (K-DUR,KLOR-CON) 20 MEQ tablet Take 1 tablet (20 mEq total) by mouth daily. 11/09/17   Larey Dresser, MD  predniSONE (DELTASONE) 10 MG tablet Take 2 tablets (20 mg total) by mouth daily. 05/19/21   Tacy Learn, PA-C  sacubitril-valsartan (ENTRESTO) 49-51 MG Take 1 tablet by mouth daily. 04/14/21   Quintella Reichert, MD    ___________________________________________________________________________________________________ Physical Exam: Vitals with BMI 08/31/2021 08/31/2021 08/31/2021  Height - - -  Weight - - -  BMI - - -  Systolic 678 938 101  Diastolic 91 751 025  Pulse 122 122 122     1. General:  in No ***Acute distress***increased work of breathing ***complaining of severe pain****agitated * Chronically ill *well *cachectic *toxic acutely ill -appearing 2. Psychological: Alert and *** Oriented 3. Head/ENT:   Moist *** Dry Mucous Membranes                          Head Non traumatic, neck supple                          Normal *** Poor Dentition 4. SKIN: normal *** decreased Skin turgor,  Skin clean Dry and intact no rash 5. Heart: Regular rate and rhythm no*** Murmur, no Rub or gallop 6. Lungs: ***Clear to auscultation bilaterally, no wheezes or crackles   7. Abdomen: Soft, ***non-tender, Non distended *** obese ***bowel sounds present 8. Lower extremities: no clubbing, cyanosis, no ***edema 9. Neurologically Grossly intact, moving all 4 extremities equally *** strength 5 out of 5 in all 4 extremities cranial nerves II through XII intact 10. MSK: Normal range of motion    Chart has been reviewed  ______________________________________________________________________________________________  Assessment/Plan  ***  Admitted for ***  Present on Admission: **None**     No problem-specific Assessment & Plan notes found for this encounter.    Other plan as per orders.  DVT prophylaxis:  SCD *** Lovenox       Code Status:    Code Status: Prior FULL CODE *** DNR/DNI  ***comfort care as per patient ***family  I had personally discussed CODE STATUS with patient and family* I had spent *min discussing goals of care and CODE STATUS    Family Communication:   Family not at  Bedside  plan of care was discussed on the phone with *** Son, Daughter, Wife, Husband, Sister, Brother , father, mother  Disposition Plan:   *** likely will need placement for rehabilitation                          Back to current facility when stable                            To home once workup is complete and patient is stable  ***Following barriers for discharge:                            Electrolytes corrected                               Anemia corrected  Pain controlled with PO medications                               Afebrile, white count improving able to transition to PO antibiotics                             Will need to be able to tolerate PO                            Will likely need home health, home O2, set up                           Will need consultants to evaluate patient prior to discharge  ****EXPECT DC tomorrow                    ***Would benefit from PT/OT eval prior to DC  Ordered                   Swallow eval - SLP ordered                   Diabetes care coordinator                   Transition of care consulted                   Nutrition    consulted                  Wound care  consulted                   Palliative care    consulted                   Behavioral health  consulted                    Consults called: ***    Admission status:  ED Disposition     None        Obs***  ***  inpatient     I Expect 2 midnight stay secondary to severity of patient's current illness need for inpatient interventions justified by the following: ***hemodynamic instability despite optimal treatment (tachycardia *hypotension * tachypnea *hypoxia, hypercapnia) * Severe lab/radiological/exam abnormalities including:      and extensive comorbidities including: *substance abuse  *Chronic pain *DM2  * CHF * CAD  * COPD/asthma *Morbid Obesity * CKD *dementia *liver disease *history of stroke with residual deficits *  malignancy, * sickle cell disease  History of amputation Chronic anticoagulation  That are currently affecting medical management.   I expect  patient to be hospitalized for 2 midnights requiring inpatient medical care.  Patient is at high risk for adverse outcome (such as loss of life or disability) if not treated.  Indication for inpatient stay as follows:  Severe change from baseline regarding mental status Hemodynamic instability despite maximal medical therapy,  ongoing suicidal ideations,  severe pain requiring acute inpatient management,  inability to maintain oral hydration   persistent chest pain despite medical management Need for operative/procedural  intervention New or worsening hypoxia   Need for IV antibiotics, IV fluids, IV rate controling medications, IV antihypertensives, IV pain medications, IV anticoagulation, need for biPAP    Level  of care   *** tele  For 12H 24H     medical floor       progressive tele indefinitely please discontinue once patient no longer qualifies COVID-19 Labs    Lab Results  Component Value Date   [place] NEGATIVE 08/16/2021     Precautions: admitted as *** Covid Negative  ***asymptomatic screening protocol****PUI *** covid positive No active isolations ***If Covid PCR is negative  - please DC precautions - would need additional investigation given very high risk for false native test result    Critical***  Patient is critically ill due to  hemodynamic instability * respiratory failure *severe sepsis* ongoing chest pain*  They are at high risk for life/limb threatening clinical deterioration requiring frequent reassessment and modifications of care.  Services provided include examination of the patient, review of  relevant ancillary tests, prescription of lifesaving therapies, review of medications and prophylactic therapy.  Total critical care time excluding separately billable procedures: 60*  Minutes.    Leonard [name] 08/31/2021, 11:19 PM ***  [facility]     after 2 AM please page floor coverage PA If 7AM-7PM, please contact the day team taking care of the patient using [email or web address]   Patient was evaluated in the context of the global COVID-19 pandemic, which necessitated consideration that the patient might be at risk for infection with the SARS-CoV-2 virus that causes COVID-19. Institutional protocols and algorithms that pertain to the evaluation of patients at risk for COVID-19 are in a state of rapid change based on information released by regulatory bodies including the CDC and federal and state organizations. These policies and algorithms were followed during the patient's care.

## 2021-08-31 NOTE — Subjective & Objective (Signed)
Reports worsening shortness of breath and exertional dyspnea leg edema and gained 15 pounds.  Reports he does not seem to get any better with just p.o. diuretics.  Needs to have IV diuretics.  He continues to use cocaine on a regular basis.

## 2021-08-31 NOTE — Assessment & Plan Note (Signed)
Spoke about importance of quitting  

## 2021-08-31 NOTE — Assessment & Plan Note (Addendum)
-   Pt diagnosed with CHF based on presence of the following rales on exam Pulmonary edema on CXR, and   bilateral leg edema   admit on telemetry,  cycle cardiac enzymes, Troponin 51   obtain serial ECG  to evaluate for ischemia as a cause of heart failure  monitor daily weight:  Filed Weights   08/31/21 1911  Weight: 124.3 kg   Last BNP BNP (last 3 results) Recent Labs    08/16/21 1810 08/20/21 2252 08/31/21 1921  BNP 449.6* 956.1* 1,530.6*      diurese with IVBUmex pt states lasix does not work for him contine and monitor orthostatics and creatinine to avoid over diuresis.  Order echogram to evaluate EF and valves

## 2021-08-31 NOTE — Assessment & Plan Note (Signed)
In the past CT shows that he has severe regurgitation with food in esophagus.  He is at high risk for aspiration pneumonia.  Chest x-ray showing possible infiltrates.  For now can cover with Unasyn and await results of further work-up patient denies any fevers or chills but does endorse increased shortness of breath

## 2021-08-31 NOTE — Assessment & Plan Note (Signed)
Used to be taking Synthroid in 2018 unsure if she is still compliant check T4 and T3 may need to be restarted on Synthroid

## 2021-09-01 ENCOUNTER — Ambulatory Visit: Payer: Medicaid Other | Admitting: Neurology

## 2021-09-01 ENCOUNTER — Observation Stay (HOSPITAL_COMMUNITY): Payer: Medicaid Other

## 2021-09-01 DIAGNOSIS — E039 Hypothyroidism, unspecified: Secondary | ICD-10-CM

## 2021-09-01 DIAGNOSIS — I5043 Acute on chronic combined systolic (congestive) and diastolic (congestive) heart failure: Secondary | ICD-10-CM

## 2021-09-01 DIAGNOSIS — I071 Rheumatic tricuspid insufficiency: Secondary | ICD-10-CM | POA: Diagnosis present

## 2021-09-01 DIAGNOSIS — Z923 Personal history of irradiation: Secondary | ICD-10-CM | POA: Diagnosis not present

## 2021-09-01 DIAGNOSIS — R778 Other specified abnormalities of plasma proteins: Secondary | ICD-10-CM | POA: Diagnosis present

## 2021-09-01 DIAGNOSIS — I110 Hypertensive heart disease with heart failure: Secondary | ICD-10-CM | POA: Diagnosis present

## 2021-09-01 DIAGNOSIS — E876 Hypokalemia: Secondary | ICD-10-CM

## 2021-09-01 DIAGNOSIS — Z6835 Body mass index (BMI) 35.0-35.9, adult: Secondary | ICD-10-CM | POA: Diagnosis not present

## 2021-09-01 DIAGNOSIS — R000 Tachycardia, unspecified: Secondary | ICD-10-CM | POA: Diagnosis present

## 2021-09-01 DIAGNOSIS — Z9114 Patient's other noncompliance with medication regimen: Secondary | ICD-10-CM | POA: Diagnosis not present

## 2021-09-01 DIAGNOSIS — F1410 Cocaine abuse, uncomplicated: Secondary | ICD-10-CM | POA: Diagnosis present

## 2021-09-01 DIAGNOSIS — Z23 Encounter for immunization: Secondary | ICD-10-CM | POA: Diagnosis not present

## 2021-09-01 DIAGNOSIS — I495 Sick sinus syndrome: Secondary | ICD-10-CM

## 2021-09-01 DIAGNOSIS — D509 Iron deficiency anemia, unspecified: Secondary | ICD-10-CM | POA: Diagnosis present

## 2021-09-01 DIAGNOSIS — R55 Syncope and collapse: Secondary | ICD-10-CM

## 2021-09-01 DIAGNOSIS — Z20822 Contact with and (suspected) exposure to covid-19: Secondary | ICD-10-CM | POA: Diagnosis present

## 2021-09-01 DIAGNOSIS — G629 Polyneuropathy, unspecified: Secondary | ICD-10-CM | POA: Diagnosis present

## 2021-09-01 DIAGNOSIS — I509 Heart failure, unspecified: Secondary | ICD-10-CM | POA: Diagnosis present

## 2021-09-01 DIAGNOSIS — I2510 Atherosclerotic heart disease of native coronary artery without angina pectoris: Secondary | ICD-10-CM

## 2021-09-01 DIAGNOSIS — E871 Hypo-osmolality and hyponatremia: Secondary | ICD-10-CM | POA: Diagnosis not present

## 2021-09-01 DIAGNOSIS — I429 Cardiomyopathy, unspecified: Secondary | ICD-10-CM | POA: Diagnosis not present

## 2021-09-01 DIAGNOSIS — I5023 Acute on chronic systolic (congestive) heart failure: Secondary | ICD-10-CM | POA: Diagnosis present

## 2021-09-01 DIAGNOSIS — F1010 Alcohol abuse, uncomplicated: Secondary | ICD-10-CM | POA: Diagnosis present

## 2021-09-01 DIAGNOSIS — I272 Pulmonary hypertension, unspecified: Secondary | ICD-10-CM | POA: Diagnosis present

## 2021-09-01 DIAGNOSIS — I428 Other cardiomyopathies: Secondary | ICD-10-CM | POA: Diagnosis present

## 2021-09-01 DIAGNOSIS — Z9221 Personal history of antineoplastic chemotherapy: Secondary | ICD-10-CM | POA: Diagnosis not present

## 2021-09-01 DIAGNOSIS — C8590 Non-Hodgkin lymphoma, unspecified, unspecified site: Secondary | ICD-10-CM | POA: Diagnosis present

## 2021-09-01 DIAGNOSIS — J449 Chronic obstructive pulmonary disease, unspecified: Secondary | ICD-10-CM | POA: Diagnosis present

## 2021-09-01 DIAGNOSIS — E669 Obesity, unspecified: Secondary | ICD-10-CM | POA: Diagnosis present

## 2021-09-01 DIAGNOSIS — D649 Anemia, unspecified: Secondary | ICD-10-CM | POA: Diagnosis not present

## 2021-09-01 DIAGNOSIS — K219 Gastro-esophageal reflux disease without esophagitis: Secondary | ICD-10-CM | POA: Diagnosis present

## 2021-09-01 DIAGNOSIS — D638 Anemia in other chronic diseases classified elsewhere: Secondary | ICD-10-CM | POA: Diagnosis present

## 2021-09-01 DIAGNOSIS — I248 Other forms of acute ischemic heart disease: Secondary | ICD-10-CM | POA: Diagnosis present

## 2021-09-01 LAB — CBC WITH DIFFERENTIAL/PLATELET
Abs Immature Granulocytes: 0 10*3/uL (ref 0.00–0.07)
Basophils Absolute: 0.2 10*3/uL — ABNORMAL HIGH (ref 0.0–0.1)
Basophils Relative: 3 %
Eosinophils Absolute: 0 10*3/uL (ref 0.0–0.5)
Eosinophils Relative: 0 %
HCT: 33.1 % — ABNORMAL LOW (ref 39.0–52.0)
Hemoglobin: 9.8 g/dL — ABNORMAL LOW (ref 13.0–17.0)
Lymphocytes Relative: 11 %
Lymphs Abs: 0.8 10*3/uL (ref 0.7–4.0)
MCH: 24.3 pg — ABNORMAL LOW (ref 26.0–34.0)
MCHC: 29.6 g/dL — ABNORMAL LOW (ref 30.0–36.0)
MCV: 81.9 fL (ref 80.0–100.0)
Monocytes Absolute: 0.3 10*3/uL (ref 0.1–1.0)
Monocytes Relative: 4 %
Neutro Abs: 6.1 10*3/uL (ref 1.7–7.7)
Neutrophils Relative %: 82 %
Platelets: 288 10*3/uL (ref 150–400)
RBC: 4.04 MIL/uL — ABNORMAL LOW (ref 4.22–5.81)
RDW: 21.3 % — ABNORMAL HIGH (ref 11.5–15.5)
WBC: 7.4 10*3/uL (ref 4.0–10.5)
nRBC: 0 % (ref 0.0–0.2)
nRBC: 0 /100 WBC

## 2021-09-01 LAB — HEPATIC FUNCTION PANEL
ALT: 31 U/L (ref 0–44)
AST: 35 U/L (ref 15–41)
Albumin: 3.8 g/dL (ref 3.5–5.0)
Alkaline Phosphatase: 102 U/L (ref 38–126)
Bilirubin, Direct: 0.2 mg/dL (ref 0.0–0.2)
Indirect Bilirubin: 0.2 mg/dL — ABNORMAL LOW (ref 0.3–0.9)
Total Bilirubin: 0.4 mg/dL (ref 0.3–1.2)
Total Protein: 7.5 g/dL (ref 6.5–8.1)

## 2021-09-01 LAB — COMPREHENSIVE METABOLIC PANEL
ALT: 31 U/L (ref 0–44)
AST: 31 U/L (ref 15–41)
Albumin: 3.7 g/dL (ref 3.5–5.0)
Alkaline Phosphatase: 94 U/L (ref 38–126)
Anion gap: 9 (ref 5–15)
BUN: 15 mg/dL (ref 6–20)
CO2: 26 mmol/L (ref 22–32)
Calcium: 8.6 mg/dL — ABNORMAL LOW (ref 8.9–10.3)
Chloride: 97 mmol/L — ABNORMAL LOW (ref 98–111)
Creatinine, Ser: 0.9 mg/dL (ref 0.61–1.24)
GFR, Estimated: >60 mL/min (ref >60)
Glucose, Bld: 127 mg/dL — ABNORMAL HIGH (ref 70–99)
Potassium: 3.2 mmol/L — ABNORMAL LOW (ref 3.5–5.1)
Sodium: 132 mmol/L — ABNORMAL LOW (ref 135–145)
Total Bilirubin: 0.7 mg/dL (ref 0.3–1.2)
Total Protein: 7.2 g/dL (ref 6.5–8.1)

## 2021-09-01 LAB — HIV ANTIBODY (ROUTINE TESTING W REFLEX): HIV Screen 4th Generation wRfx: NONREACTIVE

## 2021-09-01 LAB — PHOSPHORUS
Phosphorus: 3.1 mg/dL (ref 2.5–4.6)
Phosphorus: 2.9 mg/dL (ref 2.5–4.6)

## 2021-09-01 LAB — PROCALCITONIN: Procalcitonin: <0.1 ng/mL

## 2021-09-01 LAB — RETICULOCYTES
Immature Retic Fract: 34.9 % — ABNORMAL HIGH (ref 2.3–15.9)
RBC.: 4.02 MIL/uL — ABNORMAL LOW (ref 4.22–5.81)
Retic Count, Absolute: 68.7 10*3/uL (ref 19.0–186.0)
Retic Ct Pct: 1.7 % (ref 0.4–3.1)

## 2021-09-01 LAB — ECHOCARDIOGRAM COMPLETE
AR max vel: 3.26 cm2
AV Area VTI: 3.36 cm2
AV Area mean vel: 3.1 cm2
AV Mean grad: 13 mmHg
AV Peak grad: 22.7 mmHg
Ao pk vel: 2.38 m/s
Height: 71 in
S' Lateral: 4.7 cm
Single Plane A4C EF: 27.6 %
Weight: 4384 oz

## 2021-09-01 LAB — MAGNESIUM
Magnesium: 2.1 mg/dL (ref 1.7–2.4)
Magnesium: 2 mg/dL (ref 1.7–2.4)

## 2021-09-01 LAB — DIGOXIN LEVEL: Digoxin Level: <0.2 ng/mL — ABNORMAL LOW (ref 0.8–2.0)

## 2021-09-01 LAB — RESP PANEL BY RT-PCR (FLU A&B, COVID) ARPGX2
Influenza A by PCR: NEGATIVE
Influenza B by PCR: NEGATIVE
SARS Coronavirus 2 by RT PCR: NEGATIVE

## 2021-09-01 LAB — IRON AND TIBC
Iron: 33 ug/dL — ABNORMAL LOW (ref 45–182)
Saturation Ratios: 6 % — ABNORMAL LOW (ref 17.9–39.5)
TIBC: 599 ug/dL — ABNORMAL HIGH (ref 250–450)
UIBC: 566 ug/dL

## 2021-09-01 LAB — TSH: TSH: 12.361 u[IU]/mL — ABNORMAL HIGH (ref 0.350–4.500)

## 2021-09-01 LAB — FERRITIN: Ferritin: 37 ng/mL (ref 24–336)

## 2021-09-01 LAB — OSMOLALITY: Osmolality: 287 mOsm/kg (ref 275–295)

## 2021-09-01 LAB — MRSA NEXT GEN BY PCR, NASAL: MRSA by PCR Next Gen: NOT DETECTED

## 2021-09-01 LAB — T4, FREE: Free T4: 0.53 ng/dL — ABNORMAL LOW (ref 0.61–1.12)

## 2021-09-01 LAB — FOLATE: Folate: 24.8 ng/mL (ref >5.9)

## 2021-09-01 LAB — CK: Total CK: 185 U/L (ref 49–397)

## 2021-09-01 LAB — VITAMIN B12: Vitamin B-12: 267 pg/mL (ref 180–914)

## 2021-09-01 MED ORDER — POTASSIUM CHLORIDE CRYS ER 20 MEQ PO TBCR
40.0000 meq | EXTENDED_RELEASE_TABLET | Freq: Once | ORAL | Status: AC
  Administered 2021-09-01: 40 meq via ORAL
  Filled 2021-09-01: qty 2

## 2021-09-01 MED ORDER — UMECLIDINIUM BROMIDE 62.5 MCG/ACT IN AEPB
1.0000 | INHALATION_SPRAY | Freq: Every day | RESPIRATORY_TRACT | Status: DC
  Administered 2021-09-04 – 2021-09-05 (×2): 1 via RESPIRATORY_TRACT
  Filled 2021-09-01: qty 7

## 2021-09-01 MED ORDER — FUROSEMIDE 10 MG/ML IJ SOLN: 40.0000 mg | Freq: Two times a day (BID) | INTRAMUSCULAR | Status: DC

## 2021-09-01 MED ORDER — PANTOPRAZOLE SODIUM 40 MG PO TBEC
40.0000 mg | DELAYED_RELEASE_TABLET | Freq: Every day | ORAL | Status: DC
  Administered 2021-09-01 – 2021-09-05 (×5): 40 mg via ORAL
  Filled 2021-09-01 (×5): qty 1

## 2021-09-01 MED ORDER — HYDROCODONE-ACETAMINOPHEN 5-325 MG PO TABS
1.0000 | ORAL_TABLET | ORAL | Status: DC | PRN
  Administered 2021-09-01 – 2021-09-03 (×9): 2 via ORAL
  Administered 2021-09-04: 1 via ORAL
  Administered 2021-09-04 – 2021-09-05 (×4): 2 via ORAL
  Filled 2021-09-01 (×14): qty 2

## 2021-09-01 MED ORDER — TRAMADOL HCL 50 MG PO TABS: 50.0000 mg | ORAL_TABLET | Freq: Four times a day (QID) | ORAL | Status: DC | PRN

## 2021-09-01 MED ORDER — ASPIRIN 81 MG PO CHEW
81.0000 mg | CHEWABLE_TABLET | Freq: Every day | ORAL | Status: DC
Start: 2021-09-01 — End: 2021-09-05
  Administered 2021-09-01 – 2021-09-05 (×4): 81 mg via ORAL
  Filled 2021-09-01 (×5): qty 1

## 2021-09-01 MED ORDER — ATORVASTATIN CALCIUM 40 MG PO TABS
40.0000 mg | ORAL_TABLET | Freq: Every day | ORAL | Status: DC
  Administered 2021-09-01 – 2021-09-05 (×5): 40 mg via ORAL
  Filled 2021-09-01 (×5): qty 1

## 2021-09-01 MED ORDER — SODIUM CHLORIDE 0.9 % IV SOLN: 75.0000 mL/h | INTRAVENOUS | Status: DC

## 2021-09-01 MED ORDER — LEVOTHYROXINE SODIUM 75 MCG PO TABS: 75.0000 ug | ORAL_TABLET | Freq: Every day | ORAL | Status: DC

## 2021-09-01 MED ORDER — DIGOXIN 125 MCG PO TABS
0.1250 mg | ORAL_TABLET | Freq: Every day | ORAL | Status: DC
  Administered 2021-09-01 – 2021-09-05 (×5): 0.125 mg via ORAL
  Filled 2021-09-01 (×5): qty 1

## 2021-09-01 MED ORDER — ACETAMINOPHEN 325 MG PO TABS: 650.0000 mg | ORAL_TABLET | Freq: Four times a day (QID) | ORAL | Status: DC | PRN

## 2021-09-01 MED ORDER — PNEUMOCOCCAL VAC POLYVALENT 25 MCG/0.5ML IJ INJ
0.5000 mL | INJECTION | INTRAMUSCULAR | Status: AC
  Administered 2021-09-02: 0.5 mL via INTRAMUSCULAR
  Filled 2021-09-01: qty 0.5

## 2021-09-01 MED ORDER — GABAPENTIN 100 MG PO CAPS
200.0000 mg | ORAL_CAPSULE | Freq: Three times a day (TID) | ORAL | Status: DC
  Administered 2021-09-01 – 2021-09-05 (×13): 200 mg via ORAL
  Filled 2021-09-01 (×13): qty 2

## 2021-09-01 MED ORDER — PANTOPRAZOLE SODIUM 40 MG PO TBEC: 40.0000 mg | DELAYED_RELEASE_TABLET | Freq: Every day | ORAL | Status: DC

## 2021-09-01 MED ORDER — FUROSEMIDE 10 MG/ML IJ SOLN
80.0000 mg | Freq: Two times a day (BID) | INTRAMUSCULAR | Status: DC
  Administered 2021-09-01 – 2021-09-02 (×2): 80 mg via INTRAVENOUS
  Filled 2021-09-01 (×2): qty 8

## 2021-09-01 MED ORDER — BUMETANIDE 0.25 MG/ML IJ SOLN
1.0000 mg | Freq: Two times a day (BID) | INTRAMUSCULAR | Status: DC
  Administered 2021-09-01: 1 mg via INTRAVENOUS
  Filled 2021-09-01 (×3): qty 4

## 2021-09-01 MED ORDER — TIOTROPIUM BROMIDE MONOHYDRATE 18 MCG IN CAPS: 18.0000 ug | ORAL_CAPSULE | Freq: Every day | RESPIRATORY_TRACT | Status: DC

## 2021-09-01 MED ORDER — MOMETASONE FURO-FORMOTEROL FUM 200-5 MCG/ACT IN AERO
2.0000 | INHALATION_SPRAY | Freq: Two times a day (BID) | RESPIRATORY_TRACT | Status: DC
  Administered 2021-09-03 – 2021-09-05 (×4): 2 via RESPIRATORY_TRACT
  Filled 2021-09-01: qty 8.8

## 2021-09-01 MED ORDER — GABAPENTIN 300 MG PO CAPS
300.0000 mg | ORAL_CAPSULE | Freq: Three times a day (TID) | ORAL | Status: DC
Start: 2021-09-01 — End: 2021-09-01

## 2021-09-01 MED ORDER — LORAZEPAM 2 MG/ML IJ SOLN
0.5000 mg | Freq: Two times a day (BID) | INTRAMUSCULAR | Status: DC | PRN
  Administered 2021-09-01 – 2021-09-03 (×2): 0.5 mg via INTRAVENOUS
  Filled 2021-09-01 (×2): qty 1

## 2021-09-01 MED ORDER — LEVOTHYROXINE SODIUM 75 MCG PO TABS
75.0000 ug | ORAL_TABLET | Freq: Every day | ORAL | Status: DC
  Administered 2021-09-01 – 2021-09-05 (×5): 75 ug via ORAL
  Filled 2021-09-01 (×5): qty 1

## 2021-09-01 MED ORDER — SODIUM CHLORIDE 0.9 % IV SOLN
2.0000 g | INTRAVENOUS | Status: DC
  Administered 2021-09-01: 2 g via INTRAVENOUS
  Filled 2021-09-01: qty 20

## 2021-09-01 MED ORDER — ALBUTEROL SULFATE (2.5 MG/3ML) 0.083% IN NEBU
2.5000 mg | INHALATION_SOLUTION | RESPIRATORY_TRACT | Status: DC | PRN
  Administered 2021-09-01: 2.5 mg via RESPIRATORY_TRACT
  Filled 2021-09-01: qty 3

## 2021-09-01 MED ORDER — ALBUTEROL SULFATE HFA 108 (90 BASE) MCG/ACT IN AERS: 2.0000 | INHALATION_SPRAY | RESPIRATORY_TRACT | Status: DC | PRN

## 2021-09-01 MED ORDER — SPIRONOLACTONE 12.5 MG HALF TABLET
12.5000 mg | ORAL_TABLET | Freq: Every day | ORAL | Status: DC
  Administered 2021-09-01 – 2021-09-02 (×2): 12.5 mg via ORAL
  Filled 2021-09-01 (×2): qty 1

## 2021-09-01 MED ORDER — BUMETANIDE 0.25 MG/ML IJ SOLN
0.5000 mg | Freq: Two times a day (BID) | INTRAMUSCULAR | Status: DC
Start: 2021-09-01 — End: 2021-09-01

## 2021-09-01 MED ORDER — ACETAMINOPHEN 650 MG RE SUPP: 650.0000 mg | Freq: Four times a day (QID) | RECTAL | Status: DC | PRN

## 2021-09-01 NOTE — Progress Notes (Signed)
Heart Failure Navigator Progress Note ° °Assessed for Heart & Vascular TOC clinic readiness.  °Patient does not meet criteria due to AHF rounding team consulted this hospitalization.  ° °Navigator available for educational resources. ° °Diana Davenport, MSN, RN °Heart Failure Nurse Navigator °[phone] ° ° °

## 2021-09-01 NOTE — Consult Note (Addendum)
Advanced Heart Failure Team Consult Note   Primary Physician: Secundino Ginger, PA-C PCP-Cardiologist:  None  Reason for Consultation: Acute on chronic systolic CHF  HPI:    Leonard [name] is seen today for evaluation of acute on chronic systolic CHF at the request of Dr. Roel Cluck with [facility].   46 y.o. male with history of non-Hodgkin's lymphoma s/p Chemoradiation at [facility] - currently in remission, hx cocaine abuse and alcohol abuse, HTN, Asthma, GERD, suspected OSA, COPD, iron deficiency anemia, dental caries with dental abscesses 12/22 and chronic pain.     Admitted 04/05/16 with worsening SOB and peripheral edema x 2 months.  Echo 04/07/16 LVEF 25-30%, Grade 2 DD, Mild AR, Mild MR, PA peak pressure 47 mm Hg.  [place] 04/15/16 with mild CAD, marginal cardiac output and RA 13.   Echo 09/2016: EF 40-45%, RV okay  Previously followed in HF clinic by Dr. Aundra Dubin. Has not been seen since 2018.   Multiple ED visits in recent months for acute on chronic CHF. He was admitted to [facility] on 08/01/21 for acute on chronic systolic CHF. UDS positive for cocaine. Had not been adherent with medical therapy including furosemide and entresto. Echo during admit with EF 30-35%, RV okay. Diuresed with IV lasix. Discharged on bumex 1 mg BID, digoxin 0.125 mg daily, entresto 24/26 mg BID.   Subsequently no-showed his cardiology follow-up at [facility] and did not return phone calls to their HF clinic.  Seen in ED on 12/24 with volume overload and given 40 mg lasix IV.  Prescribed po antibiotics for dental abscess. Returned to ED 12/28 for worsening dyspnea after using cocaine for a few days. Left AMA.  Presented to ED yesterday evening with dyspnea, CP, orthopnea, PND and abdominal bloating. Reports he has gained 23 lb since discharge from [facility] last month. Reports adherence with medications and states he has made minimal urine last few days despite doubling bumex at home. Several syncopal episodes last  few days. Gets dizzy upon standing or when attempting to void.  Vitals on presentation: BP 157/111 mmHg, Pulse 122, T 97.5, Resp 18, O2 100% on RA. Labs with Scr 1.0, Na 133, K 3.7, Hgb 10.6, BNP 1530, Hs troponin 51 > 53. ECG sinus tach 120 bpm. Given 40 mg lasix IV. Chest-xray with patchy airspace disease right mid lower lung zone suspicious for pneumonia. Admitted for further management of acute on chronic systolic CHF. Initiated on IV bumex I mg BID. Has been diuresing well in ED. On Unasyn for possible pneumonia.   Echo today: EF 30-35%, RV mildly reduced, RVSP 52 mmHg, mld to moderate TR, trivial MR  SH: Currently living in [place] with his wife. Reports he was incarcerated for 20 years, currently on probation and has ankle bracelet.  Denies alcohol or tobacco use. Uses cocaine every couple of weeks, last used 2 weeks ago   FH: No family hx CHF. Mother passed from [place].  Review of Systems: [y] = yes, [ ]  = no   General: Weight gain [Y]; Weight loss [ ] ; Anorexia [ ] ; Fatigue [Y]; Fever [ ] ; Chills [ ] ; Weakness [ ]   Cardiac: Chest pain/pressure [ ] ; Resting SOB [Y]; Exertional SOB [Y]; Orthopnea [Y]; Pedal Edema [Y]; Palpitations [ ] ; Syncope [Y]; Presyncope [ ] ; Paroxysmal nocturnal dyspnea[Y]  Pulmonary: Cough [ ] ; Wheezing[ ] ; Hemoptysis[ ] ; Sputum [ ] ; Snoring [ ]   GI: Vomiting[ ] ; Dysphagia[ ] ; Melena[ ] ; Hematochezia [ ] ; Heartburn[ ] ; Abdominal pain [ ] ;  Constipation [ ] ; Diarrhea [ ] ; BRBPR [ ]   GU: Hematuria[ ] ; Dysuria [ ] ; Nocturia[ ]   Vascular: Pain in legs with walking [ ] ; Pain in feet with lying flat [ ] ; Non-healing sores [ ] ; Stroke [ ] ; TIA [ ] ; Slurred speech [ ] ;  Neuro: Headaches[ ] ; Vertigo[ ] ; Seizures[ ] ; Paresthesias[ ] ;Blurred vision [ ] ; Diplopia [ ] ; Vision changes [ ]   Ortho/Skin: Arthritis [ ] ; Joint pain [ ] ; Muscle pain [ ] ; Joint swelling [ ] ; Back Pain [ ] ; Rash [ ]   Psych: Depression[ ] ; Anxiety[ ]   Heme: Bleeding problems [ ] ; Clotting disorders [ ] ;  Anemia [ ]   Endocrine: Diabetes [ ] ; Thyroid dysfunction[Y]  Home Medications Prior to Admission medications   Medication Sig Start Date End Date Taking? Authorizing Provider  aspirin 81 MG EC tablet Take 81 mg by mouth daily. 08/09/21 11/07/21 Yes [provider]  bumetanide (BUMEX) 1 MG tablet Take 1 mg by mouth 2 (two) times daily. 08/09/21  Yes [provider]  cyclobenzaprine (FLEXERIL) 10 MG tablet Take 10 mg by mouth 3 (three) times daily as needed.   Yes [provider]  digoxin (LANOXIN) 0.125 MG tablet Take 0.25 mg by mouth daily. 08/08/21 11/06/21 Yes [provider]  fluticasone-salmeterol (ADVAIR) 500-50 MCG/ACT AEPB Inhale 1 puff into the lungs in the morning and at bedtime. 12/09/17  Yes [provider]  furosemide (LASIX) 40 MG tablet Take 1 tablet (40 mg total) by mouth 2 (two) times daily. Patient taking differently: Take 40 mg by mouth 3 (three) times daily as needed for fluid or edema. 11/09/17  Yes Larey Dresser, MD  gabapentin (NEURONTIN) 300 MG capsule Take 1 capsule (300 mg total) by mouth 3 (three) times daily. Patient taking differently: Take 900 mg by mouth 3 (three) times daily. 04/14/21  Yes Quintella Reichert, MD  ibuprofen (ADVIL) 200 MG tablet Take 800 mg by mouth every 6 (six) hours as needed for headache or moderate pain.   Yes [provider]  levothyroxine (SYNTHROID) 75 MCG tablet Take 75 mcg by mouth daily. 08/09/21  Yes [provider]  pantoprazole (PROTONIX) 40 MG tablet Take 40 mg by mouth daily. 08/09/21  Yes [provider]  PROAIR HFA 108 (90 Base) MCG/ACT inhaler Inhale 2 puffs into the lungs every 4 (four) hours as needed. 08/09/21  Yes [provider]  sacubitril-valsartan (ENTRESTO) 49-51 MG Take 1 tablet by mouth daily. 04/14/21  Yes Quintella Reichert, MD  tiotropium (SPIRIVA) 18 MCG inhalation capsule Place 18 mcg into inhaler and inhale daily. 12/08/17 09/08/21 Yes [provider]  triamcinolone cream (KENALOG) 0.1 % Apply 1 application topically 2 (two) times daily.   Yes [provider]  amoxicillin-clavulanate (AUGMENTIN) 875-125 MG tablet Take 1 tablet by mouth every 12 (twelve) hours. Patient not taking: Reported on 08/31/2021 08/16/21   Tonye Pearson, PA-C  cyclobenzaprine (FLEXERIL) 10 MG tablet Take 1 tablet (10 mg total) by mouth 2 (two) times daily as needed for muscle spasms. Patient not taking: Reported on 08/31/2021 04/02/21   Lucrezia Starch, MD  naproxen (NAPROSYN) 500 MG tablet Take 1 tablet (500 mg total) by mouth 2 (two) times daily. Patient not taking: Reported on 08/31/2021 08/16/21 09/15/21  Tonye Pearson, PA-C  omeprazole (PRILOSEC) 20 MG capsule Take 1 capsule (20 mg total) by mouth daily. Patient not taking: Reported on 08/31/2021 04/14/21   Quintella Reichert, MD  potassium chloride SA (K-DUR,KLOR-CON) 20 MEQ tablet  Take 1 tablet (20 mEq total) by mouth daily. Patient not taking: Reported on 08/31/2021 11/09/17   Larey Dresser, MD  predniSONE (DELTASONE) 10 MG tablet Take 2 tablets (20 mg total) by mouth daily. Patient not taking: Reported on 08/31/2021 05/19/21   Tacy Learn, PA-C    Past Medical History: Past Medical History:  Diagnosis Date   AKI (acute kidney injury) ([place]) 11/09/2016   Asthma    CHF (congestive heart failure) ([place]) 04/05/2016   Chronic bronchitis (HCC)    Chronic lower back pain    Cocaine abuse ([place]) 09/03/2017   COPD (chronic obstructive pulmonary disease) ([place])    GERD (gastroesophageal reflux disease)    Headache    "weekly" (09/03/2017)   History of blood transfusion    "related to CA" (09/03/2017)   Hypertension    Hypothyroidism    Non Hodgkin's lymphoma ([place]) 03/2002   "stage IV"   Non Hodgkin's lymphoma ([place]) 04/2012   "stage II"   Pneumonia    "several times" (09/03/2017)   Sleep apnea    "went to sleep study; didn't have my normal episodes of choking /waking up not able to breathe"  (09/03/2017)    Past Surgical History: Past Surgical History:  Procedure Laterality Date   CARDIAC CATHETERIZATION N/A 04/15/2016   Procedure: Right/Left Heart Cath and Coronary Angiography;  Surgeon: Larey Dresser, MD;  Location: [place] [facility];  Service: Cardiovascular;  Laterality: N/A;   HERNIA REPAIR     IR RADIOLOGIST EVAL & MGMT  11/19/2016   IR RADIOLOGIST EVAL & MGMT  11/05/2016   LAPAROSCOPIC ASSISTED SPIGELIAN HERNIA REPAIR N/A 10/18/2016   Procedure: LAPAROSCOPIC REPAIR OF UMBILICAL HERNIA  WITH MESH AND LYSIS OF ADHESIONS.;  Surgeon: [name], MD;  Location: [facility];  Service: General;  Laterality: N/A;   tumor biopsy  03/2002   chest   WISDOM TOOTH EXTRACTION      Family History: Family History  Problem Relation Age of Onset   Cancer Mother    Emphysema Mother    Bronchitis Mother     Social History: Social History   Socioeconomic History   Marital status: Married    Spouse name: Not on file   Number of children: Not on file   Years of education: Not on file   Highest education level: Not on file  Occupational History   Not on file  Tobacco Use   Smoking status: Former    Packs/day: 1.25    Years: 15.00    Pack years: 18.75    Types: Cigarettes    Quit date: 04/08/2007    Years since quitting: 14.4   Smokeless tobacco: Current    Types: Snuff  Vaping Use   Vaping Use: Never used  Substance and Sexual Activity   Alcohol use: No    Alcohol/week: 0.0 standard drinks   Drug use: Not Currently    Types: Cocaine    Comment: last used yesterday    Sexual activity: Yes  Other Topics Concern   Not on file  Social History Narrative   Not on file   Social Determinants of Health   Financial Resource Strain: Not on file  Food Insecurity: Not on file  Transportation Needs: Not on file  Physical Activity: Not on file  Stress: Not on file  Social Connections: Not on file    Allergies:  No Known Allergies  Objective:    Vital  Signs:   Temp:  [97.5 F (36.4 C)-98.1  F (36.7 C)] 98.1 F (36.7 C) (01/09 0731) Pulse Rate:  [100-131] 101 (01/09 1000) Resp:  [12-43] 12 (01/09 1000) BP: (119-173)/(79-111) 119/81 (01/09 1000) SpO2:  [92 %-100 %] 96 % (01/09 1000) Weight:  [124.3 kg] 124.3 kg (01/08 1911)    Weight change: Filed Weights   08/31/21 1911  Weight: 124.3 kg    Intake/Output:   Intake/Output Summary (Last 24 hours) at 09/01/2021 1018 Last data filed at 09/01/2021 0849 Gross per 24 hour  Intake 201.02 ml  Output 2400 ml  Net -2198.98 ml      Physical Exam    General:  Comfortable at rest. Becomes dyspneic with conversation HEENT: + swelling right side of jaw, poor dentition Neck: supple. JVP ~ 12 cm. Carotids 2+ bilat; no bruits. No lymphadenopathy or thyromegaly appreciated. Cor: PMI nondisplaced. Regular rate & rhythm, tachy. No rubs, gallops or murmurs. Lungs: bibasilar rales Abdomen: soft, nontender, + distended. No hepatosplenomegaly. No bruits or masses. Good bowel sounds. Extremities: no cyanosis, clubbing, rash, 2+ LE edema Neuro: alert & orientedx3, cranial nerves grossly intact. moves all 4 extremities w/o difficulty. Affect pleasant   Telemetry   Sinus/sinus tach 90s-100s  EKG    Sinus tach 120 bpm  Labs   Basic Metabolic Panel: Recent Labs  Lab 08/31/21 1921 08/31/21 2222 09/01/21 0517  NA 133*  --  132*  K 3.7  --  3.2*  CL 98  --  97*  CO2 27  --  26  GLUCOSE 118*  --  127*  BUN 19  --  15  CREATININE 1.00  --  0.90  CALCIUM 8.2*  --  8.6*  MG  --  2.1 2.0  PHOS  --  3.1 2.9    Liver Function Tests: Recent Labs  Lab 08/31/21 2222 09/01/21 0517  AST 35 31  ALT 31 31  ALKPHOS 102 94  BILITOT 0.4 0.7  PROT 7.5 7.2  ALBUMIN 3.8 3.7   No results for input(s): LIPASE, AMYLASE in the last 168 hours. No results for input(s): AMMONIA in the last 168 hours.  CBC: Recent Labs  Lab 08/31/21 1921 09/01/21 0517  WBC 5.6 7.4  NEUTROABS  --  6.1  HGB  10.6* 9.8*  HCT 35.7* 33.1*  MCV 83.2 81.9  PLT 308 288    Cardiac Enzymes: Recent Labs  Lab 08/31/21 2222  CKTOTAL 185    BNP: BNP (last 3 results) Recent Labs    08/16/21 1810 08/20/21 2252 08/31/21 1921  BNP 449.6* 956.1* 1,530.6*    ProBNP (last 3 results) No results for input(s): PROBNP in the last 8760 hours.   CBG: No results for input(s): GLUCAP in the last 168 hours.  Coagulation Studies: No results for input(s): LABPROT, INR in the last 72 hours.   Imaging   DG Chest 2 View  Result Date: 08/31/2021 CLINICAL DATA:  Chest pain.  Shortness of breath. EXAM: CHEST - 2 VIEW COMPARISON:  08/20/2021, priors reviewed.  Chest CT 01/14/2018 FINDINGS: Upper normal heart size. Stable mediastinal contours with left suprahilar scarring. Patchy airspace disease in the right mid lower lung zone, progressed from prior exam. Left lung base opacities are improved. Small right pleural effusion with fluid in the fissures. No pneumothorax. No convincing pulmonary edema. Stable osseous structures. IMPRESSION: Patchy airspace disease in the right mid lower lung zone is new from prior exam, suspicious for pneumonia. Small right pleural effusion. Electronically Signed   By: Keith Rake M.D.   On: 08/31/2021 19:44  Medications:     Current Medications:  bumetanide (BUMEX) IV  1 mg Intravenous Q12H   gabapentin  200 mg Oral TID   levothyroxine  75 mcg Oral Q0600    Infusions:  ampicillin-sulbactam (UNASYN) IV Stopped (09/01/21 0849)      Patient Profile   46 y.o. male with history of chronic systolic CHF/NICM, non-Hodgkin's lymphoma s/p chemoradiation, HTN, noncompliance with medical therapy, hx cocaine and alcohol abuse. Admitted with acute on chronic systolic CHF.  Assessment/Plan   Acute on chronic systolic CHF/NICM: -Diagnosed 03/2016- Echo 04/07/16 LVEF 25-30%, Grade 2 DD, Peak PA pressure 47 mm Hg.  - R/LHC 04/15/16: Mild CAD, RA 16, PCWP mean 26 mmHg, Fick CO  4.55/CI 1.98 - Echo 10/19/16 LVEF 40-45%, Trivial AI - Echo 12/22 at [facility]: EF 30-35%, RV okay - Echo today: EF 30-35%, RV mildly reduced, RVSP 52 mmHg - Previously treated with Adriamycin for non-hodgkin's lymphoma. ? How much cocaine use contributing to cardiomyopathy. Denies recent alcohol, but had ED visits in 11/22 and 12/22 for alcohol abuse. Minimal PVCs on telemetry - Current admit with > 20 lb weight gain over last month despite increasing home diuretics.  - Switch IV bumex to 80 mg lasix IV BID - Restart digoxin 0.125 mg daily. Dig level < 0.2 - Start spiro 12.5 mg daily - Plan to add back ARNi next - Off beta blocker with history of cocaine use. Can eventually consider Carvedilol. - cMRI eventually to better assess etiology of CM - Sleep evaluation as outpatient -Needs to completely refrain from cocaine use  2. Sinus tachycardia: - Likely d/t #1 - Adding digoxin as above  3. CAD: - Mild on LHC in 2018 - HS troponin 51 > 53, likely d/t demand ischemia - Lipid panel. Add atorvastatin 40 - 81 mg aspirin if hgb remains stable  4. HTN: - BP elevated on admit. Now improving. - Meds as above  5. H/o Non-Hodgkins Lymphoma - h/o of rx with adriamycin. Likely contributing factor to his cardiomyopathy.  - Now in remission  6. Syncope -Reports multiple episodes recently. ? How much substance abuse contributing. Dizziness with position changes and voiding.  -Recurrent problem for several years on chart review. -Monitor for arrhythmias on tele -Check orthostatics   7. Possible pneumonia -On IV abx -right mid lower lung airspace disease on CXR -AF  -Procalcitonin < 0.10  8. B/l lung abnormality on imaging -CTA chest 12/22: chronic collapse left upper lobe and focal obstruction left upper lobe bronchus.  Thick band soft tissue attenuation right upper lobe. Does not appear to be vasculature, may represent mass or collapsed lung parenchyma. Findings similar to prior CT  03/2020 -? If changes d/t prior radiation therapy  9. Hx iron deficiency anemia: -hgb trending down last few weeks, 11.9>10.4>10.6>9.8 -Iron studies pending  10. Hypothyroidism: -TSH 12.3, T3/Free T4 pending -On synthroid -Per TR[facility]  11. Hypokalemia: -K 3.2.  -Supp -Add spiro as above -Mag 2.0  12. Hx cocaine and ETOH abuse: -Complete cessation discussed -UDS pending  13. Noncompliance with medical therapy: -Reports better compliance with medications after recent hospitalization in December  14. Dental caries/abscesses: -2 recent coarses of abx -Reports he needs teeth pulled but has not been completed. States he needed medical clearance by MD    Length of Stay: 0  FINCH, LINDSAY N, PA-C  09/01/2021, 10:18 AM  Advanced Heart Failure Team Pager [phone] (M-F; 7a - 5p)  Please contact [facility] for night-coverage after hours (4p -7a ) and weekends  on [email or web address]   Patient seen and examined with the above-signed Advanced Practice Provider and/or Housestaff. I personally reviewed laboratory data, imaging studies and relevant notes. I independently examined the patient and formulated the important aspects of the plan. I have edited the note to reflect any of my changes or salient points. I have personally discussed the plan with the patient and/or family.  46 y/o male with h/o NHL, chronic LUL collapse, HTN, systolic HF due to NICM and polysubstance abuse.   Previously followed in [facility] by Dr. Aundra Dubin (2017) but more recently followed in [place] at [facility] but does not know the name of any of the cardiologists he has seen.   Admitted for HF in 12/22 and diuresed. Over past few weeks has had progressive volume overload now with NYHA IV symptoms. Has been noncompliant with meds and continues to use cocaine and ETOH. When SOB gets bad develops extreme CP.   Echo EF 30-35% with thickened AoV  Mildly reduced RV  General:  Chronically-ill appearing. No resp  difficulty HEENT: normal poor dentition  Neck: supple. nVP to ear Carotids 2+ bilat; no bruits. No lymphadenopathy or thryomegaly appreciated. Cor: PMI laterally displaced. Regular tachy + s3 Abdomen: obese soft, nontender, nondistended. No hepatosplenomegaly. No bruits or masses. Good bowel sounds. Extremities: no cyanosis, clubbing, rash, 2-3+ edema Neuro: alert & orientedx3, cranial nerves grossly intact. moves all 4 extremities w/o difficulty. Affect pleasant  He has severe HF in setting of ongoing cocaine use and noncompliance. Seems to be responding to IV diuresis. Agree with digoxin, spiro and IV lasix. Low threshold to start milrinone.   Long discussion about need to quit substance abuse. Will need repeat R/L cath prior to d/c.   Glori Bickers, MD  9:04 PM

## 2021-09-01 NOTE — Assessment & Plan Note (Signed)
Chronic stable in the setting of CHF and demand ischemia

## 2021-09-01 NOTE — Progress Notes (Signed)
°  Echocardiogram 2D Echocardiogram has been performed.  Leonard [name] 09/01/2021, 10:02 AM

## 2021-09-01 NOTE — Assessment & Plan Note (Signed)
Persistent, not a good candidate for BB given ongoing cocaine abuse, defer to cardiology Have had CTA in the past 2 wks and was negative

## 2021-09-01 NOTE — ED Notes (Signed)
Pt was tearful when talking with the RN. Support given and asked if th ept needed anything. Pt states he is tearful because of "what the doctors had to say"

## 2021-09-01 NOTE — Progress Notes (Signed)
PROGRESS NOTE  Leonard [name] IZT:[id number] DOB: [birth year] DOA: 08/31/2021 PCP: Secundino Ginger, PA-C  Brief History   Leonard [name] is a 46 y.o. male with medical history significant of chronic combined systolic diastolic CHF EF 38-25%.cocaine abuse, COPD, elevated troponin, non-Hodgkin's lymphoma in remission , and status post chemo and radiation last dose in 2014.  He has presented with shortness of breath and 20 lb weight gain over the past month. He had PND and abdominal bloating. He also states that he is also complaining of dizziness upon standing. He stated that he has had diminishing urinary output over the last few days. He stated that he had been taking his medications as prescribed.   He has had multiple ED and inpatient encounters in the late part of 2022. The patient was admitted to [facility] on 08/01/2021 for Acute on Chronic CHF with a UDS at that time positive for cocaine. The patient had been medically noncompliant with entresto and lasix. Echo on that admission demonstrated an EF of 30-35% with normal RV function. He was discharged on bumex 1 mg bid, digoxin 0.125 mg daily and entresto 24/26 mg bid. The patient did not keep follow up appointment at [facility] and did not return calls to their HF clinic.   CTA chest performed on 08/04/2022 demonstrated chronic collapse of the left upper lobe and a focal obstruction of the left upper lobe bronchus. There was also a thick band of soft tissue along the medial apex of the right upper lobe. This may represent a mass or collapsed lung parenchyma. There was food debris throughout the esophagus and stomach. Both were distended. Possible gastric outlet obstruction. Small volume of fluid in the superior mediastinum - previously observed on earlier scans. No PE was observed   CXR upon admission demonstrated patchy infiltrates in the right mid-lower lung zone that is new from the prior exam and which is suspicious for pneumonia.   The patient was  seen in ED at [facility] on 08/16/2021 with complaints of volume overload and a dental abscess. The patient was given IV Lasix and antibiotics for the abscess. He returned 4 days later with worsening shortness of breath after using cocaine for a few days. The patient left AMA.   Although the patient's cardiac issues has been managed by Dr. Marigene Ehlers in the past, he hasn't follow up with him in a few years. . At [facility] patient would benefit from repeat cardiac catheterization due to reduced EF and mildly elevated Trops. Cardiology would prefer for patient to show compliance prior to proceeding. This can be further addressed in outpatient setting. The patient states he lives now in [place] and wants to establish Cardiology care in [place].  Does not smoke, rare ETOH    Initial COVID TEST  in house  PCR testing  Pending   Recent Labs       Lab Results  Component Value Date    [place] NEGATIVE 08/16/2021    [place] NEGATIVE 05/19/2021        Regarding pertinent Chronic problems:       chronic CHF diastolic/systolic/ combined - last echo 08/04/2021 1.  Left ventricle: The cavity size is mildly increased. Systolic function is moderately to severely reduced. The estimated ejection fraction is 30-35%. Hypokinesis of the anterior wall. Hypokinesis of the anteroseptal wall.  2.  Aortic valve: The valve is moderately calcified. Mild aortic stenosis is noted. There is mild regurgitation.  3.  Right ventricle: The cavity size is normal. Systolic function is  normal.      Hypothyroidism:  Recent Labs       Lab Results  Component Value Date    TSH 18.264 (H) 08/16/2021     Not on  synthroid   COPD - not  followed by pulmonology        Chronic anemia - baseline hg Hemoglobin & Hematocrit  Recent Labs (within last 365 days)    Consultants  Heart failure team  Procedures  None  Antibiotics   Anti-infectives (From admission, onward)    Start     Dose/Rate Route Frequency  Ordered Stop   09/01/21 1200  cefTRIAXone (ROCEPHIN) 2 g in sodium chloride 0.9 % 100 mL IVPB        2 g 200 mL/hr over 30 Minutes Intravenous Every 24 hours 09/01/21 1153     09/01/21 0000  Ampicillin-Sulbactam (UNASYN) 3 g in sodium chloride 0.9 % 100 mL IVPB  Status:  Discontinued        3 g 200 mL/hr over 30 Minutes Intravenous Every 6 hours 08/31/21 2354 09/01/21 1153        Subjective  The patient is resting quietly. No new complaints.   Objective   Vitals:  Vitals:   09/01/21 1300 09/01/21 1400  BP: 131/88 (!) 128/91  Pulse: (!) 109 (!) 113  Resp: (!) 21 20  Temp:    SpO2: 98% 94%    Exam:  Constitutional:  The patient is awake, alert, and oriented x 3. No acute distress. Respiratory:  No increased work of breathing. No wheezes or rhonchi Positive for scattered rales at right mid lung zones No tactile fremitus Cardiovascular:  Regular rate and rhythm No murmurs, ectopy, or gallups. No lateral PMI. No thrills. Abdomen:  Abdomen is soft, non-tender, non-distended No hernias, masses, or organomegaly Normoactive bowel sounds.  Musculoskeletal:  No cyanosis, clubbing, or edema Skin:  No rashes, lesions, ulcers palpation of skin: no induration or nodules Neurologic:  CN 2-12 intact Sensation all 4 extremities intact Psychiatric:  Mental status Mood, affect appropriate Orientation to person, place, time  judgment and insight appear intact   I have personally reviewed the following:   Today's Data  Vitals  Lab Data  CBC CMP Procalcitonin TSH FT4  Micro Data    Imaging  CXR  Cardiology Data  EKG  Other Data    Scheduled Meds:  digoxin  0.125 mg Oral Daily   furosemide  80 mg Intravenous BID   gabapentin  200 mg Oral TID   levothyroxine  75 mcg Oral Q0600   pantoprazole  40 mg Oral Daily   spironolactone  12.5 mg Oral Daily   Continuous Infusions:  cefTRIAXone (ROCEPHIN)  IV Stopped (09/01/21 1337)    Principal Problem:    CHF exacerbation (HCC) Active Problems:   Aspiration pneumonia (HCC)   Acute on chronic combined systolic and diastolic CHF (congestive heart failure) (HCC)   TSH elevation   Cocaine abuse (HCC)   Anemia   COPD (chronic obstructive pulmonary disease) (HCC)   Sinus tachycardia   Elevated troponin   LOS: 0 days   A & P  Acute on chronic combined systolic and diastolic CHF:  Heart failure team has been consulted. Echocardiogram has been ordered and the patient is being diuresed on lasix 80 mg IV bid. The patient is being monitored on telemetry. He is receiving digoxin, ASA 81 mg daily, Lipitor 40 mg daily, and spironolactone 12.5 mg daily. Elevated troponin is being followed by cardiology.  Patchy  infiltrate on CXR: Concern for aspiration pneumonia. No elevation of WBC. No fevers. Positive for mild rales on auscultation, but this is likely due to pulmonary edema. Procalcitonin is negative. The patient had been started on Unasyn from the ED and changed to Rocephin by the heart failure team due to sodium in Unasyn. I will stop antibiotics as I do not see strong evidence for pneumonia.  COPD: Chronic bronchitis. The patient is not followed by pulmonology.He will be continued on Dulera, Spiriva, and as needed albuterol. Appears stable.  Hypothyroidism: TSH is elevated at 12.361 and FT4 is low at 0.53. Patient should be taking synthroid 75 mcg daily. Pt is non-compliant with his medications. This may be playing a role in his CHF. Synthroid will be continued at 75 mcg daily.  Neuropathy:  Continue gabapentin.  Cocaine abuse: Pt is counseled to stop cocaine. Will consult SW to refer patient to outpatient drug rehab.  Anemia: Likely combination nutritional and anemia of chronic disease. Anemia panel is pending.  I have seen and examined this patient myself. I have spent 37 minutes in his evaluation and care.  DVT Prophylaxis: SCD's CODE STATUS: Full Code Family Communication: None  available Disposition: Home  Kylynn Street, DO [facility] Direct contact: see [email or web address]  7PM-7AM contact night coverage as above 09/01/2021, 5:24 PM  LOS: 0 days

## 2021-09-02 DIAGNOSIS — Z8572 Personal history of non-Hodgkin lymphomas: Secondary | ICD-10-CM

## 2021-09-02 DIAGNOSIS — I10 Essential (primary) hypertension: Secondary | ICD-10-CM

## 2021-09-02 DIAGNOSIS — I428 Other cardiomyopathies: Secondary | ICD-10-CM

## 2021-09-02 DIAGNOSIS — R918 Other nonspecific abnormal finding of lung field: Secondary | ICD-10-CM

## 2021-09-02 DIAGNOSIS — I5043 Acute on chronic combined systolic (congestive) and diastolic (congestive) heart failure: Secondary | ICD-10-CM

## 2021-09-02 LAB — BASIC METABOLIC PANEL
Anion gap: 11 (ref 5–15)
BUN: 19 mg/dL (ref 6–20)
CO2: 28 mmol/L (ref 22–32)
Calcium: 8.6 mg/dL — ABNORMAL LOW (ref 8.9–10.3)
Chloride: 97 mmol/L — ABNORMAL LOW (ref 98–111)
Creatinine, Ser: 0.98 mg/dL (ref 0.61–1.24)
GFR, Estimated: >60 mL/min (ref >60)
Glucose, Bld: 146 mg/dL — ABNORMAL HIGH (ref 70–99)
Potassium: 3.6 mmol/L (ref 3.5–5.1)
Sodium: 136 mmol/L (ref 135–145)

## 2021-09-02 LAB — LIPID PANEL
Cholesterol: 165 mg/dL (ref 0–200)
HDL: 37 mg/dL — ABNORMAL LOW (ref >40)
LDL Cholesterol: 104 mg/dL — ABNORMAL HIGH (ref 0–99)
Total CHOL/HDL Ratio: 4.5 RATIO
Triglycerides: 121 mg/dL (ref <150)
VLDL: 24 mg/dL (ref 0–40)

## 2021-09-02 LAB — PROCALCITONIN: Procalcitonin: <0.1 ng/mL

## 2021-09-02 MED ORDER — POTASSIUM CHLORIDE CRYS ER 20 MEQ PO TBCR
40.0000 meq | EXTENDED_RELEASE_TABLET | Freq: Once | ORAL | Status: AC
  Administered 2021-09-02: 40 meq via ORAL
  Filled 2021-09-02: qty 2

## 2021-09-02 MED ORDER — AMOXICILLIN-POT CLAVULANATE 875-125 MG PO TABS
1.0000 | ORAL_TABLET | Freq: Two times a day (BID) | ORAL | Status: DC
  Administered 2021-09-02 – 2021-09-05 (×6): 1 via ORAL
  Filled 2021-09-02 (×6): qty 1

## 2021-09-02 MED ORDER — SODIUM CHLORIDE 0.9 % IV SOLN: INTRAVENOUS | Status: DC

## 2021-09-02 MED ORDER — FUROSEMIDE 10 MG/ML IJ SOLN
15.0000 mg/h | INTRAVENOUS | Status: DC
  Administered 2021-09-02 – 2021-09-03 (×2): 15 mg/h via INTRAVENOUS
  Filled 2021-09-02 (×3): qty 20

## 2021-09-02 MED ORDER — ENOXAPARIN SODIUM 60 MG/0.6ML IJ SOSY
60.0000 mg | PREFILLED_SYRINGE | INTRAMUSCULAR | Status: DC
  Administered 2021-09-02 – 2021-09-05 (×3): 60 mg via SUBCUTANEOUS
  Filled 2021-09-02 (×3): qty 0.6

## 2021-09-02 MED ORDER — SODIUM CHLORIDE 0.9 % IV SOLN: 250.0000 mL | INTRAVENOUS | Status: DC | PRN

## 2021-09-02 MED ORDER — SODIUM CHLORIDE 0.9 % IV SOLN
510.0000 mg | Freq: Once | INTRAVENOUS | Status: AC
  Administered 2021-09-02: 510 mg via INTRAVENOUS
  Filled 2021-09-02: qty 17

## 2021-09-02 MED ORDER — SACUBITRIL-VALSARTAN 24-26 MG PO TABS
1.0000 | ORAL_TABLET | Freq: Two times a day (BID) | ORAL | Status: DC
  Administered 2021-09-02 – 2021-09-03 (×4): 1 via ORAL
  Filled 2021-09-02 (×4): qty 1

## 2021-09-02 MED ORDER — SODIUM CHLORIDE 0.9% FLUSH: 3.0000 mL | INTRAVENOUS | Status: DC | PRN

## 2021-09-02 MED ORDER — ASPIRIN 81 MG PO CHEW
81.0000 mg | CHEWABLE_TABLET | ORAL | Status: AC
  Administered 2021-09-03: 81 mg via ORAL
  Filled 2021-09-02: qty 1

## 2021-09-02 MED ORDER — SODIUM CHLORIDE 0.9% FLUSH
3.0000 mL | Freq: Two times a day (BID) | INTRAVENOUS | Status: DC
  Administered 2021-09-02 – 2021-09-05 (×5): 3 mL via INTRAVENOUS

## 2021-09-02 NOTE — Progress Notes (Signed)
CARDIAC REHAB PHASE I   PRE:  Rate/Rhythm: 100 ST    BP: sitting 134/93    SaO2: 96 RA  MODE:  Ambulation: 340 ft   POST:  Rate/Rhythm: 116 ST    BP: sitting 136/100     SaO2: 95 RA  C/o SOB with distance. Sts it might be a little better but really has been present for 6 months, unable to walk long distance. VSS. Gave HF booklet to begin reading. [place], ACSM 09/02/2021 12:27 PM

## 2021-09-02 NOTE — H&P (View-Only) (Signed)
Advanced Heart Failure Rounding Note  PCP-Cardiologist: None   Subjective:   Admit weight 274 --->270 pounds.   Diuresed with IV lasix. Negative 3.2 liters.   Complaining of leg edema. Remains SOB with exertion.    Objective:   Weight Range: 122.8 kg Body mass index is 37.76 kg/m.   Vital Signs:   Temp:  [97.5 F (36.4 C)-98.1 F (36.7 C)] 98.1 F (36.7 C) (01/10 0725) Pulse Rate:  [99-113] 102 (01/10 0725) Resp:  [12-25] 20 (01/10 0446) BP: (119-142)/(81-107) 139/94 (01/10 0725) SpO2:  [94 %-100 %] 99 % (01/10 0725) Weight:  [122.8 kg] 122.8 kg (01/10 0446)    Weight change: Filed Weights   08/31/21 1911 09/02/21 0446  Weight: 124.3 kg 122.8 kg    Intake/Output:   Intake/Output Summary (Last 24 hours) at 09/02/2021 0843 Last data filed at 09/02/2021 0838 Gross per 24 hour  Intake 1420.28 ml  Output 4350 ml  Net -2929.72 ml      Physical Exam    General:  Well appearing. No resp difficulty HEENT: Normal Neck: Supple. JVP 11-12  Carotids 2+ bilat; no bruits. No lymphadenopathy or thyromegaly appreciated. Cor: PMI nondisplaced. Tachy Regular rate & rhythm. No rubs, or murmurs.Marland Kitchen +S3  Lungs: Clear Abdomen: Soft, nontender, nondistended. No hepatosplenomegaly. No bruits or masses. Good bowel sounds. Extremities: No cyanosis, clubbing, rash, R and LLE 1-2+edema Neuro: Alert & orientedx3, cranial nerves grossly intact. moves all 4 extremities w/o difficulty. Affect pleasant   Telemetry   Sinus Tach 100s   EKG    N/A   Labs    CBC Recent Labs    08/31/21 1921 09/01/21 0517  WBC 5.6 7.4  NEUTROABS  --  6.1  HGB 10.6* 9.8*  HCT 35.7* 33.1*  MCV 83.2 81.9  PLT 308 277   Basic Metabolic Panel Recent Labs    08/31/21 1921 08/31/21 2222 09/01/21 0517  NA 133*  --  132*  K 3.7  --  3.2*  CL 98  --  97*  CO2 27  --  26  GLUCOSE 118*  --  127*  BUN 19  --  15  CREATININE 1.00  --  0.90  CALCIUM 8.2*  --  8.6*  MG  --  2.1 2.0  PHOS   --  3.1 2.9   Liver Function Tests Recent Labs    08/31/21 2222 09/01/21 0517  AST 35 31  ALT 31 31  ALKPHOS 102 94  BILITOT 0.4 0.7  PROT 7.5 7.2  ALBUMIN 3.8 3.7   No results for input(s): LIPASE, AMYLASE in the last 72 hours. Cardiac Enzymes Recent Labs    08/31/21 2222  CKTOTAL 185    BNP: BNP (last 3 results) Recent Labs    08/16/21 1810 08/20/21 2252 08/31/21 1921  BNP 449.6* 956.1* 1,530.6*    ProBNP (last 3 results) No results for input(s): PROBNP in the last 8760 hours.   D-Dimer No results for input(s): DDIMER in the last 72 hours. Hemoglobin A1C No results for input(s): HGBA1C in the last 72 hours. Fasting Lipid Panel Recent Labs    09/02/21 0508  CHOL 165  HDL 37*  LDLCALC 104*  TRIG 121  CHOLHDL 4.5   Thyroid Function Tests Recent Labs    09/01/21 0517  TSH 12.361*    Other results:   Imaging    ECHOCARDIOGRAM COMPLETE  Result Date: 09/01/2021    ECHOCARDIOGRAM REPORT   Patient Name:   Leonard [name] Date of Exam:  09/01/2021 Medical Rec #:  [id number]      Height:       71.0 in Accession #:    [id number]     Weight:       274.0 lb Date of Birth:  1976/06/01       BSA:          2.410 m Patient Age:    78 years       BP:           146/110 mmHg Patient Gender: M              HR:           107 bpm. Exam Location:  Inpatient Procedure: 2D Echo Indications:    Cardiomyopathy  History:        Patient has prior history of Echocardiogram examinations, most                 recent 10/19/2016. CHF, COPD; Risk Factors:cocaine abuse.  Sonographer:    Johny Chess RDCS Referring Phys: [place]  1. Left ventricular ejection fraction, by estimation, is 30 to 35%. The left ventricle has moderately decreased function. The left ventricle demonstrates regional wall motion abnormalities (see scoring diagram/findings for description). There is mild left ventricular hypertrophy. Left ventricular diastolic parameters are indeterminate.   2. Right ventricular systolic function is mildly reduced. The right ventricular size is normal. There is moderately elevated pulmonary artery systolic pressure. The estimated right ventricular systolic pressure is 37.8 mmHg.  3. The mitral valve is normal in structure. Trivial mitral valve regurgitation.  4. Tricuspid valve regurgitation is mild to moderate.  5. The aortic valve is calcified. There is moderate calcification of the aortic valve. Aortic valve regurgitation is mild. Mild aortic valve stenosis.  6. Aortic dilatation noted. There is dilatation of the aortic root, measuring 40 mm.  7. The inferior vena cava is dilated in size with <50% respiratory variability, suggesting right atrial pressure of 15 mmHg. FINDINGS  Left Ventricle: Left ventricular ejection fraction, by estimation, is 30 to 35%. The left ventricle has moderately decreased function. The left ventricle demonstrates regional wall motion abnormalities. The left ventricular internal cavity size was normal in size. There is mild left ventricular hypertrophy. Left ventricular diastolic parameters are indeterminate.  LV Wall Scoring: The entire anterior wall and entire septum are akinetic. The apex is hypokinetic. The entire lateral wall and entire inferior wall are normal. Right Ventricle: The right ventricular size is normal. Right vetricular wall thickness was not well visualized. Right ventricular systolic function is mildly reduced. There is moderately elevated pulmonary artery systolic pressure. The tricuspid regurgitant velocity is 3.03 m/s, and with an assumed right atrial pressure of 15 mmHg, the estimated right ventricular systolic pressure is 58.8 mmHg. Left Atrium: Left atrial size was normal in size. Right Atrium: Right atrial size was normal in size. Pericardium: Trivial pericardial effusion is present. Mitral Valve: The mitral valve is normal in structure. Trivial mitral valve regurgitation. Tricuspid Valve: The tricuspid valve is  normal in structure. Tricuspid valve regurgitation is mild to moderate. Aortic Valve: The aortic valve is calcified. There is moderate calcification of the aortic valve. Aortic valve regurgitation is mild. Mild aortic stenosis is present. Aortic valve mean gradient measures 13.0 mmHg. Aortic valve peak gradient measures 22.7  mmHg. Aortic valve area, by VTI measures 3.36 cm. Pulmonic Valve: The pulmonic valve was grossly normal. Pulmonic valve regurgitation is not visualized. Aorta: Aortic dilatation noted. There is dilatation  of the aortic root, measuring 40 mm. Venous: The inferior vena cava is dilated in size with less than 50% respiratory variability, suggesting right atrial pressure of 15 mmHg. IAS/Shunts: The interatrial septum was not well visualized.  LEFT VENTRICLE PLAX 2D LVIDd:         5.60 cm      Diastology LVIDs:         4.70 cm      LV e' medial: 4.35 cm/s LV PW:         1.20 cm LV IVS:        1.00 cm LVOT diam:     2.60 cm LV SV:         122 LV SV Index:   51 LVOT Area:     5.31 cm  LV Volumes (MOD) LV vol d, MOD A4C: 133.0 ml LV vol s, MOD A4C: 96.3 ml LV SV MOD A4C:     133.0 ml RIGHT VENTRICLE            IVC RV S prime:     9.25 cm/s  IVC diam: 2.60 cm TAPSE (M-mode): 1.4 cm LEFT ATRIUM             Index        RIGHT ATRIUM           Index LA diam:        3.90 cm 1.62 cm/m   RA Area:     18.00 cm LA Vol (A2C):   49.0 ml 20.33 ml/m  RA Volume:   51.10 ml  21.20 ml/m LA Vol (A4C):   49.9 ml 20.70 ml/m LA Biplane Vol: 49.7 ml 20.62 ml/m  AORTIC VALVE AV Area (Vmax):    3.26 cm AV Area (Vmean):   3.10 cm AV Area (VTI):     3.36 cm AV Vmax:           238.00 cm/s AV Vmean:          164.000 cm/s AV VTI:            0.363 m AV Peak Grad:      22.7 mmHg AV Mean Grad:      13.0 mmHg LVOT Vmax:         146.00 cm/s LVOT Vmean:        95.900 cm/s LVOT VTI:          0.230 m LVOT/AV VTI ratio: 0.63  AORTA Ao Root diam: 4.00 cm Ao Asc diam:  3.30 cm TRICUSPID VALVE TR Peak grad:   36.7 mmHg TR Vmax:         303.00 cm/s  SHUNTS Systemic VTI:  0.23 m Systemic Diam: 2.60 cm Oswaldo Milian MD Electronically signed by Oswaldo Milian MD Signature Date/Time: 09/01/2021/10:54:01 AM    Final      Medications:     Scheduled Medications:  aspirin  81 mg Oral Daily   atorvastatin  40 mg Oral Daily   digoxin  0.125 mg Oral Daily   furosemide  80 mg Intravenous BID   gabapentin  200 mg Oral TID   levothyroxine  75 mcg Oral Q0600   mometasone-formoterol  2 puff Inhalation BID   pantoprazole  40 mg Oral Daily   pneumococcal 23 valent vaccine  0.5 mL Intramuscular Tomorrow-1000   spironolactone  12.5 mg Oral Daily   umeclidinium bromide  1 puff Inhalation Daily    Infusions:   PRN Medications: acetaminophen **OR** acetaminophen, albuterol, HYDROcodone-acetaminophen, LORazepam, traMADol  Patient Profile   46 y.o. male with history of chronic systolic CHF/NICM, non-Hodgkin's lymphoma s/p chemoradiation, HTN, noncompliance with medical therapy, hx cocaine and alcohol abuse. Admitted with acute on chronic systolic CHF  Assessment/Plan   Acute on chronic systolic CHF/NICM: -Diagnosed 03/2016- Echo 04/07/16 LVEF 25-30%, Grade 2 DD, Peak PA pressure 47 mm Hg.  - R/LHC 04/15/16: Mild CAD, RA 16, PCWP mean 26 mmHg, Fick CO 4.55/CI 1.98 - Echo 10/19/16 LVEF 40-45%, Trivial AI - Echo 12/22 [facility]: EF 30-35%, RV okay - Echo to EF 30-35%, RV mildly reduced, RVSP 52 mmHg - Previously treated with Adriamycin for non-hodgkin's lymphoma. ? How much cocaine use contributing to cardiomyopathy. Denies recent alcohol, but had ED visits in 11/22 and 12/22 for alcohol abuse. Minimal PVCs on telemetry - Current admit with > 20 lb weight gain over last month despite increasing home diuretics.  - Stop intermittent lasix. Start lasix drip 15 mg per hour.  - Continue  digoxin 0.125 mg daily. Dig level < 0.2 - Continue  spiro 12.5 mg daily - Add entresto 24-26 mg twice a day.  - Off beta blocker with  history of cocaine use. Can eventually consider Carvedilol. - cMRI eventually to better assess etiology of CM - Sleep evaluation as outpatient -Needs to completely refrain from cocaine use   2. Sinus tachycardia: - Likely d/t #1 - Continue digoxin as above   3. CAD: - Mild on LHC in 2018 - HS troponin 51 > 53, likely d/t demand ischemia - Lipid panel. Add atorvastatin 40 - 81 mg aspirin if hgb remains stable   4. HTN: - Elevated but adding entresto.    5. H/o Non-Hodgkins Lymphoma - h/o of rx with adriamycin. Likely contributing factor to his cardiomyopathy.  - Now in remission   6. Syncope -Reports multiple episodes recently. ? How much substance abuse contributing. Dizziness with position changes and voiding.  -Recurrent problem for several years on chart review. -Monitor for arrhythmias on tele -Check orthostatics    7. Possible pneumonia -On IV abx -right mid lower lung airspace disease on CXR -AF  -Procalcitonin < 0.10   8. B/l lung abnormality on imaging -CTA chest 12/22: chronic collapse left upper lobe and focal obstruction left upper lobe bronchus.  Thick band soft tissue attenuation right upper lobe. Does not appear to be vasculature, may represent mass or collapsed lung parenchyma. Findings similar to prior CT 03/2020 -? If changes d/t prior radiation therapy   9. Hx iron deficiency anemia: -hgb trending down last few weeks, 11.9>10.4>10.6>9.8 -Iron sats low. Give feraheme   10. Hypothyroidism: -TSH 12.3, T3/Free T4 pending -On synthroid -Per TRH   11. Hypokalemia: -Check BMET    12. Hx cocaine and ETOH abuse: -Complete cessation discussed -UDS pending - Consult SW for substance abuse.    51. Noncompliance with medical therapy: -Reports better compliance with medications after recent hospitalization in December   14. Dental caries/abscesses: -2 recent coarses of abx -Reports he needs teeth pulled but has not been completed. States he needed  medical clearance by MD  RHC/LHC once diuresed. Set up for tomorrow.     Consult cardiac rehab. Consult SW for substance abuse.     Length of Stay: 1  Amy Clegg, NP  09/02/2021, 8:43 AM  Advanced Heart Failure Team Pager [phone] (M-F; 7a - 5p)  Please contact [facility] for night-coverage after hours (5p -7a ) and weekends on [email or web address]  Patient seen with NP, agree with the above note.  He diuresed well yesterday, net negative 3255.  No BMET yet today.  Feeling better.   General: NAD Neck: JVP 14+, no thyromegaly or thyroid nodule.  Lungs: Clear to auscultation bilaterally with normal respiratory effort. CV: Lateral PMI.  Heart regular S1/S2, no S3/S4, no murmur.  1+ edema to knees.   Abdomen: Soft, nontender, no hepatosplenomegaly, no distention.  Skin: Intact without lesions or rashes.  Neurologic: Alert and oriented x 3.  Psych: Normal affect. Extremities: No clubbing or cyanosis.  HEENT: Normal.   1. Acute on chronic systolic CHF: Long-standing cardiomyopathy, echo this admission with EF 30-35%, mildly decreased RV function. Cath in 2017 showed no coronary disease.  He has a history of cocaine as well as prior Adriamycin for non-Hodgkin's lymphoma, either of which may contribute to CMP. ?ETOH contributing => had ER visits with ETOH abuse but denies current heavy ETOH.  He is volume overloaded, admitted with 20 lb weight gain.  - Lasix gtt 15 mg/hr.  - Continue digoxin.  - Continue spironolactone 12.5 daily.  - Add back Entresto 24/26 bid.  - Needs BMET stat.  - LHC/RHC after diuresis, possibly tomorrow afternoon.  Discussed risks/benefits and patient agrees to procedure.  - Cardiac MRI if unremarkable cath.  2. Substance abuse: Cocaine, ?ETOH.  Advised cessation.  3. Hypothyroidism: Per primary service.  4. ?PNA: Right mid lung infiltrate but afebrile, PCT < 0.1. Off abx. Possible prior changes from radiation with lymphoma.  5. ?Syncope/presyncope: Possible  orthostatic events.  BP is elevated.  EF has been low but does not have ICD due to substance abuse.  6. Anemia: Fe deficiency.   - Will give feraheme.   Loralie Champagne 09/02/2021 10:02 AM

## 2021-09-02 NOTE — Progress Notes (Signed)
PROGRESS NOTE  Chanson Teems  OEV:[id number] DOB: 12/09/1975 DOA: 08/31/2021 PCP: Secundino Ginger, PA-C   Brief Narrative: Rodolph Hagemann is a 46 y.o. male with a history of cocaine abuse, chronic CHF, COPD, NHL s/p chemotherapy and XRT who presented to the ED with swelling, 20 lbs weight gain, dizziness, PND.   He has had multiple ED and inpatient encounters in the late part of 2022. The patient was admitted to [facility] on 08/01/2021 for Acute on Chronic CHF with a UDS at that time positive for cocaine. The patient had been medically noncompliant with entresto and lasix. Echo on that admission demonstrated an EF of 30-35% with normal RV function. He was discharged on bumex 1 mg bid, digoxin 0.125 mg daily and entresto 24/26 mg bid. The patient did not keep follow up appointment at [facility] and did not return calls to their HF clinic.    CTA chest performed on 08/04/2022 demonstrated chronic collapse of the left upper lobe and a focal obstruction of the left upper lobe bronchus. There was also a thick band of soft tissue along the medial apex of the right upper lobe. This may represent a mass or collapsed lung parenchyma. There was food debris throughout the esophagus and stomach. Both were distended. Possible gastric outlet obstruction. Small volume of fluid in the superior mediastinum - previously observed on earlier scans. No PE was observed    CXR upon admission demonstrated patchy infiltrates in the right mid-lower lung zone that is new from the prior exam and which is suspicious for pneumonia.    The patient was seen in ED at [facility] on 08/16/2021 with complaints of volume overload and a dental abscess. The patient was given IV Lasix and antibiotics for the abscess. He returned 4 days later with worsening shortness of breath after using cocaine for a few days. The patient left AMA.    Although the patient's cardiac issues has been managed by Dr. Marigene Ehlers in the past, he hasn't follow up with him in a  few years.  Assessment & Plan: Principal Problem:   Acute on chronic combined systolic and diastolic CHF (congestive heart failure) (HCC) Active Problems:   TSH elevation   Cocaine abuse (HCC)   Anemia   COPD (chronic obstructive pulmonary disease) (HCC)   Sinus tachycardia   Elevated troponin  Acute on chronic HFrEF:   - Appreciate heart failure team following. Starting lasix gtt, anticipate RHC, LHC.  - Entresto, spironolactone, digoxin - May be candidate for coreg if abstains from cocaine. - Monitor I/O, weights.  - Eventual cMRI to evaluate underlying cause of cardiomyopathy   Odontogenic infection:  - Restart abx, can use augmentin to avoid Na load of unasyn. Dental surgery evaluation would be helpful once cardiac issues optimized.   Patchy infiltrate on CXR: PCT negative, most abnormalities are similar to prior imaging, ?due to radiation.  - Augmentin given as above, though current suspicion for pneumonia is low.  Cocaine abuse:  - Cessation counseling provided. Caution with BB  COPD: Chronic bronchitis.  - Continue dulera, spiriva, prn albuterol.   Hypothyroidism: TSH is elevated at 12.361 and FT4 is low at 0.53, consistent with reported incomplete medical adherence.  - Continue synthroid 75mcg, monitor TFTs once adherent for 6-8 weeks.    Iron deficiency anemia:  - Feraheme  CAD: Mild on last check, 2018 LHC.  - Continue ASA, start statin.   Neuropathy:   - Continue gabapentin.   History of NHL: In remission.   Obesity: Estimated  body mass index is 37.76 kg/m as calculated from the following:   Height as of this encounter: 5\' 11"  (1.803 m).   Weight as of this encounter: 122.8 kg.  DVT prophylaxis: Lovenox Code Status: Full Family Communication: None at bedside Disposition Plan:  Status is: Inpatient  Remains inpatient appropriate because: IV diuresis  Consultants:  Heart Failure Team  Procedures:  RHC/LHC prior to  discharge  Antimicrobials: Unasyn 1/8 - 1/9 Ceftriaxone 1/9 Augmentin 1/10 >>  Subjective: Urinating frequently, still swollen. No chest pain currently. Facial swelling is stable. No fevers. No dyspnea reported.   Objective: Vitals:   09/01/21 2334 09/02/21 0446 09/02/21 0725 09/02/21 1009  BP: (!) 141/100 (!) 136/95 (!) 139/94 (!) 139/96  Pulse: (!) 106 (!) 102 (!) 102 (!) 105  Resp: 20 20    Temp: 98 F (36.7 C) (!) 97.5 F (36.4 C) 98.1 F (36.7 C)   TempSrc: Oral Oral Oral   SpO2: 96% 97% 99% 99%  Weight:  122.8 kg    Height:        Intake/Output Summary (Last 24 hours) at 09/02/2021 1255 Last data filed at 09/02/2021 1243 Gross per 24 hour  Intake 1560 ml  Output 6125 ml  Net -4565 ml   Filed Weights   08/31/21 1911 09/02/21 0446  Weight: 124.3 kg 122.8 kg    Gen: 46 y.o. male in no distress Pulm: Non-labored breathing. Clear to auscultation bilaterally.  CV: Regular rate and rhythm. No murmur, rub, or gallop. + JVD, + pedal edema extending to knees. GI: Abdomen soft, non-tender, non-distended, with normoactive bowel sounds. No organomegaly or masses felt. Ext: Warm, no deformities. Ankle monitor on RLE. Skin: No rashes, lesions or ulcers on visualized skin Neuro: Alert and oriented. No focal neurological deficits. Psych: Judgement and insight appear normal. Mood & affect appropriate.   Data Reviewed: I have personally reviewed following labs and imaging studies  CBC: Recent Labs  Lab 08/31/21 1921 09/01/21 0517  WBC 5.6 7.4  NEUTROABS  --  6.1  HGB 10.6* 9.8*  HCT 35.7* 33.1*  MCV 83.2 81.9  PLT 308 559   Basic Metabolic Panel: Recent Labs  Lab 08/31/21 1921 08/31/21 2222 09/01/21 0517 09/02/21 0508  NA 133*  --  132* 136  K 3.7  --  3.2* 3.6  CL 98  --  97* 97*  CO2 27  --  26 28  GLUCOSE 118*  --  127* 146*  BUN 19  --  15 19  CREATININE 1.00  --  0.90 0.98  CALCIUM 8.2*  --  8.6* 8.6*  MG  --  2.1 2.0  --   PHOS  --  3.1 2.9  --     GFR: Estimated Creatinine Clearance: 129.6 mL/min (by C-G formula based on SCr of 0.98 mg/dL). Liver Function Tests: Recent Labs  Lab 08/31/21 2222 09/01/21 0517  AST 35 31  ALT 31 31  ALKPHOS 102 94  BILITOT 0.4 0.7  PROT 7.5 7.2  ALBUMIN 3.8 3.7   No results for input(s): LIPASE, AMYLASE in the last 168 hours. No results for input(s): AMMONIA in the last 168 hours. Coagulation Profile: No results for input(s): INR, PROTIME in the last 168 hours. Cardiac Enzymes: Recent Labs  Lab 08/31/21 2222  CKTOTAL 185   BNP (last 3 results) No results for input(s): PROBNP in the last 8760 hours. HbA1C: No results for input(s): HGBA1C in the last 72 hours. CBG: No results for input(s): GLUCAP in the  last 168 hours. Lipid Profile: Recent Labs    09/02/21 0508  CHOL 165  HDL 37*  LDLCALC 104*  TRIG 121  CHOLHDL 4.5   Thyroid Function Tests: Recent Labs    09/01/21 0206 09/01/21 0517  TSH  --  12.361*  FREET4 0.53*  --    Anemia Panel: Recent Labs    09/01/21 0206 09/01/21 0517  VITAMINB12 267  --   FOLATE 24.8  --   FERRITIN 37  --   TIBC 599*  --   IRON 33*  --   RETICCTPCT  --  1.7   Urine analysis:    Component Value Date/Time   COLORURINE YELLOW 04/14/2021 0025   APPEARANCEUR CLEAR 04/14/2021 0025   LABSPEC 1.023 04/14/2021 0025   PHURINE 5.0 04/14/2021 0025   GLUCOSEU NEGATIVE 04/14/2021 0025   HGBUR NEGATIVE 04/14/2021 0025   BILIRUBINUR NEGATIVE 04/14/2021 0025   KETONESUR NEGATIVE 04/14/2021 0025   PROTEINUR 30 (A) 04/14/2021 0025   NITRITE NEGATIVE 04/14/2021 0025   LEUKOCYTESUR NEGATIVE 04/14/2021 0025   Recent Results (from the past 240 hour(s))  Resp Panel by RT-PCR (Flu A&B, Covid) Nasopharyngeal Swab     Status: None   Collection Time: 08/31/21  5:51 AM   Specimen: Nasopharyngeal Swab; Nasopharyngeal(NP) swabs in vial transport medium  Result Value Ref Range Status   SARS Coronavirus 2 by RT PCR NEGATIVE NEGATIVE Final    Comment:  (NOTE) SARS-CoV-2 target nucleic acids are NOT DETECTED.  The SARS-CoV-2 RNA is generally detectable in upper respiratory specimens during the acute phase of infection. The lowest concentration of SARS-CoV-2 viral copies this assay can detect is 138 copies/mL. A negative result does not preclude SARS-Cov-2 infection and should not be used as the sole basis for treatment or other patient management decisions. A negative result may occur with  improper specimen collection/handling, submission of specimen other than nasopharyngeal swab, presence of viral mutation(s) within the areas targeted by this assay, and inadequate number of viral copies(<138 copies/mL). A negative result must be combined with clinical observations, patient history, and epidemiological information. The expected result is Negative.  Fact Sheet for Patients:  EntrepreneurPulse.com.au  Fact Sheet for Healthcare Providers:  IncredibleEmployment.be  This test is no t yet approved or cleared by the [place] FDA and  has been authorized for detection and/or diagnosis of SARS-CoV-2 by FDA under an Emergency Use Authorization (EUA). This EUA will remain  in effect (meaning this test can be used) for the duration of the COVID-19 declaration under Section 564(b)(1) of the Act, 21 U.S.C.section 360bbb-3(b)(1), unless the authorization is terminated  or revoked sooner.       Influenza A by PCR NEGATIVE NEGATIVE Final   Influenza B by PCR NEGATIVE NEGATIVE Final    Comment: (NOTE) The Xpert Xpress SARS-CoV-2/FLU/RSV plus assay is intended as an aid in the diagnosis of influenza from Nasopharyngeal swab specimens and should not be used as a sole basis for treatment. Nasal washings and aspirates are unacceptable for Xpert Xpress SARS-CoV-2/FLU/RSV testing.  Fact Sheet for Patients: EntrepreneurPulse.com.au  Fact Sheet for Healthcare  Providers: IncredibleEmployment.be  This test is not yet approved or cleared by the [place] FDA and has been authorized for detection and/or diagnosis of SARS-CoV-2 by FDA under an Emergency Use Authorization (EUA). This EUA will remain in effect (meaning this test can be used) for the duration of the COVID-19 declaration under Section 564(b)(1) of the Act, 21 U.S.C. section 360bbb-3(b)(1), unless the authorization is terminated or revoked.  Performed at [facility], [place] [address], [place], [place] [address]   MRSA Next Gen by PCR, Nasal     Status: None   Collection Time: 08/31/21 11:42 PM   Specimen: Nasal Mucosa; Nasal Swab  Result Value Ref Range Status   MRSA by PCR Next Gen NOT DETECTED NOT DETECTED Final    Comment: (NOTE) The GeneXpert MRSA Assay (FDA approved for NASAL specimens only), is one component of a comprehensive MRSA colonization surveillance program. It is not intended to diagnose MRSA infection nor to guide or monitor treatment for MRSA infections. Test performance is not FDA approved in patients less than 70 years old. Performed at [facility], [place] [address], [place],  [address]       Radiology Studies: DG Chest 2 View  Result Date: 08/31/2021 CLINICAL DATA:  Chest pain.  Shortness of breath. EXAM: CHEST - 2 VIEW COMPARISON:  08/20/2021, priors reviewed.  Chest CT 01/14/2018 FINDINGS: Upper normal heart size. Stable mediastinal contours with left suprahilar scarring. Patchy airspace disease in the right mid lower lung zone, progressed from prior exam. Left lung base opacities are improved. Small right pleural effusion with fluid in the fissures. No pneumothorax. No convincing pulmonary edema. Stable osseous structures. IMPRESSION: Patchy airspace disease in the right mid lower lung zone is new from prior exam, suspicious for pneumonia. Small right pleural effusion. Electronically Signed   By: Keith Rake M.D.    On: 08/31/2021 19:44   ECHOCARDIOGRAM COMPLETE  Result Date: 09/01/2021    ECHOCARDIOGRAM REPORT   Patient Name:   ELVYN Lietz Date of Exam: 09/01/2021 Medical Rec #:  [id number]      Height:       71.0 in Accession #:    [id number]     Weight:       274.0 lb Date of Birth:  1976-05-10       BSA:          2.410 m Patient Age:    56 years       BP:           146/110 mmHg Patient Gender: M              HR:           107 bpm. Exam Location:  Inpatient Procedure: 2D Echo Indications:    Cardiomyopathy  History:        Patient has prior history of Echocardiogram examinations, most                 recent 10/19/2016. CHF, COPD; Risk Factors:cocaine abuse.  Sonographer:    Johny Chess RDCS Referring Phys: [place]  1. Left ventricular ejection fraction, by estimation, is 30 to 35%. The left ventricle has moderately decreased function. The left ventricle demonstrates regional wall motion abnormalities (see scoring diagram/findings for description). There is mild left ventricular hypertrophy. Left ventricular diastolic parameters are indeterminate.  2. Right ventricular systolic function is mildly reduced. The right ventricular size is normal. There is moderately elevated pulmonary artery systolic pressure. The estimated right ventricular systolic pressure is 25.4 mmHg.  3. The mitral valve is normal in structure. Trivial mitral valve regurgitation.  4. Tricuspid valve regurgitation is mild to moderate.  5. The aortic valve is calcified. There is moderate calcification of the aortic valve. Aortic valve regurgitation is mild. Mild aortic valve stenosis.  6. Aortic dilatation noted. There is dilatation of the aortic root, measuring 40 mm.  7.  The inferior vena cava is dilated in size with <50% respiratory variability, suggesting right atrial pressure of 15 mmHg. FINDINGS  Left Ventricle: Left ventricular ejection fraction, by estimation, is 30 to 35%. The left ventricle has moderately decreased  function. The left ventricle demonstrates regional wall motion abnormalities. The left ventricular internal cavity size was normal in size. There is mild left ventricular hypertrophy. Left ventricular diastolic parameters are indeterminate.  LV Wall Scoring: The entire anterior wall and entire septum are akinetic. The apex is hypokinetic. The entire lateral wall and entire inferior wall are normal. Right Ventricle: The right ventricular size is normal. Right vetricular wall thickness was not well visualized. Right ventricular systolic function is mildly reduced. There is moderately elevated pulmonary artery systolic pressure. The tricuspid regurgitant velocity is 3.03 m/s, and with an assumed right atrial pressure of 15 mmHg, the estimated right ventricular systolic pressure is 78.5 mmHg. Left Atrium: Left atrial size was normal in size. Right Atrium: Right atrial size was normal in size. Pericardium: Trivial pericardial effusion is present. Mitral Valve: The mitral valve is normal in structure. Trivial mitral valve regurgitation. Tricuspid Valve: The tricuspid valve is normal in structure. Tricuspid valve regurgitation is mild to moderate. Aortic Valve: The aortic valve is calcified. There is moderate calcification of the aortic valve. Aortic valve regurgitation is mild. Mild aortic stenosis is present. Aortic valve mean gradient measures 13.0 mmHg. Aortic valve peak gradient measures 22.7  mmHg. Aortic valve area, by VTI measures 3.36 cm. Pulmonic Valve: The pulmonic valve was grossly normal. Pulmonic valve regurgitation is not visualized. Aorta: Aortic dilatation noted. There is dilatation of the aortic root, measuring 40 mm. Venous: The inferior vena cava is dilated in size with less than 50% respiratory variability, suggesting right atrial pressure of 15 mmHg. IAS/Shunts: The interatrial septum was not well visualized.  LEFT VENTRICLE PLAX 2D LVIDd:         5.60 cm      Diastology LVIDs:         4.70 cm       LV e' medial: 4.35 cm/s LV PW:         1.20 cm LV IVS:        1.00 cm LVOT diam:     2.60 cm LV SV:         122 LV SV Index:   51 LVOT Area:     5.31 cm  LV Volumes (MOD) LV vol d, MOD A4C: 133.0 ml LV vol s, MOD A4C: 96.3 ml LV SV MOD A4C:     133.0 ml RIGHT VENTRICLE            IVC RV S prime:     9.25 cm/s  IVC diam: 2.60 cm TAPSE (M-mode): 1.4 cm LEFT ATRIUM             Index        RIGHT ATRIUM           Index LA diam:        3.90 cm 1.62 cm/m   RA Area:     18.00 cm LA Vol (A2C):   49.0 ml 20.33 ml/m  RA Volume:   51.10 ml  21.20 ml/m LA Vol (A4C):   49.9 ml 20.70 ml/m LA Biplane Vol: 49.7 ml 20.62 ml/m  AORTIC VALVE AV Area (Vmax):    3.26 cm AV Area (Vmean):   3.10 cm AV Area (VTI):     3.36 cm AV Vmax:  238.00 cm/s AV Vmean:          164.000 cm/s AV VTI:            0.363 m AV Peak Grad:      22.7 mmHg AV Mean Grad:      13.0 mmHg LVOT Vmax:         146.00 cm/s LVOT Vmean:        95.900 cm/s LVOT VTI:          0.230 m LVOT/AV VTI ratio: 0.63  AORTA Ao Root diam: 4.00 cm Ao Asc diam:  3.30 cm TRICUSPID VALVE TR Peak grad:   36.7 mmHg TR Vmax:        303.00 cm/s  SHUNTS Systemic VTI:  0.23 m Systemic Diam: 2.60 cm Oswaldo Milian MD Electronically signed by Oswaldo Milian MD Signature Date/Time: 09/01/2021/10:54:01 AM    Final     Scheduled Meds:  aspirin  81 mg Oral Daily   atorvastatin  40 mg Oral Daily   digoxin  0.125 mg Oral Daily   gabapentin  200 mg Oral TID   levothyroxine  75 mcg Oral Q0600   mometasone-formoterol  2 puff Inhalation BID   pantoprazole  40 mg Oral Daily   sacubitril-valsartan  1 tablet Oral BID   sodium chloride flush  3 mL Intravenous Q12H   spironolactone  12.5 mg Oral Daily   umeclidinium bromide  1 puff Inhalation Daily   Continuous Infusions:  furosemide (LASIX) 200 mg in dextrose 5% 100 mL (2mg /mL) infusion 15 mg/hr (09/02/21 1204)     LOS: 1 day   Patrecia Pour, MD [facility] [email or web address] 09/02/2021, 12:55 PM

## 2021-09-02 NOTE — Plan of Care (Signed)
?  Problem: Education: ?Goal: Ability to verbalize understanding of medication therapies will improve ?Outcome: Progressing ?  ?Problem: Activity: ?Goal: Capacity to carry out activities will improve ?Outcome: Progressing ?  ?Problem: Cardiac: ?Goal: Ability to achieve and maintain adequate cardiopulmonary perfusion will improve ?Outcome: Progressing ?  ?

## 2021-09-02 NOTE — Progress Notes (Addendum)
Advanced Heart Failure Rounding Note  PCP-Cardiologist: None   Subjective:   Admit weight 274 --->270 pounds.   Diuresed with IV lasix. Negative 3.2 liters.   Complaining of leg edema. Remains SOB with exertion.    Objective:   Weight Range: 122.8 kg Body mass index is 37.76 kg/m.   Vital Signs:   Temp:  [97.5 F (36.4 C)-98.1 F (36.7 C)] 98.1 F (36.7 C) (01/10 0725) Pulse Rate:  [99-113] 102 (01/10 0725) Resp:  [12-25] 20 (01/10 0446) BP: (119-142)/(81-107) 139/94 (01/10 0725) SpO2:  [94 %-100 %] 99 % (01/10 0725) Weight:  [122.8 kg] 122.8 kg (01/10 0446)    Weight change: Filed Weights   08/31/21 1911 09/02/21 0446  Weight: 124.3 kg 122.8 kg    Intake/Output:   Intake/Output Summary (Last 24 hours) at 09/02/2021 0843 Last data filed at 09/02/2021 0838 Gross per 24 hour  Intake 1420.28 ml  Output 4350 ml  Net -2929.72 ml      Physical Exam    General:  Well appearing. No resp difficulty HEENT: Normal Neck: Supple. JVP 11-12  Carotids 2+ bilat; no bruits. No lymphadenopathy or thyromegaly appreciated. Cor: PMI nondisplaced. Tachy Regular rate & rhythm. No rubs, or murmurs.Marland Kitchen +S3  Lungs: Clear Abdomen: Soft, nontender, nondistended. No hepatosplenomegaly. No bruits or masses. Good bowel sounds. Extremities: No cyanosis, clubbing, rash, R and LLE 1-2+edema Neuro: Alert & orientedx3, cranial nerves grossly intact. moves all 4 extremities w/o difficulty. Affect pleasant   Telemetry   Sinus Tach 100s   EKG    N/A   Labs    CBC Recent Labs    08/31/21 1921 09/01/21 0517  WBC 5.6 7.4  NEUTROABS  --  6.1  HGB 10.6* 9.8*  HCT 35.7* 33.1*  MCV 83.2 81.9  PLT 308 631   Basic Metabolic Panel Recent Labs    08/31/21 1921 08/31/21 2222 09/01/21 0517  NA 133*  --  132*  K 3.7  --  3.2*  CL 98  --  97*  CO2 27  --  26  GLUCOSE 118*  --  127*  BUN 19  --  15  CREATININE 1.00  --  0.90  CALCIUM 8.2*  --  8.6*  MG  --  2.1 2.0  PHOS   --  3.1 2.9   Liver Function Tests Recent Labs    08/31/21 2222 09/01/21 0517  AST 35 31  ALT 31 31  ALKPHOS 102 94  BILITOT 0.4 0.7  PROT 7.5 7.2  ALBUMIN 3.8 3.7   No results for input(s): LIPASE, AMYLASE in the last 72 hours. Cardiac Enzymes Recent Labs    08/31/21 2222  CKTOTAL 185    BNP: BNP (last 3 results) Recent Labs    08/16/21 1810 08/20/21 2252 08/31/21 1921  BNP 449.6* 956.1* 1,530.6*    ProBNP (last 3 results) No results for input(s): PROBNP in the last 8760 hours.   D-Dimer No results for input(s): DDIMER in the last 72 hours. Hemoglobin A1C No results for input(s): HGBA1C in the last 72 hours. Fasting Lipid Panel Recent Labs    09/02/21 0508  CHOL 165  HDL 37*  LDLCALC 104*  TRIG 121  CHOLHDL 4.5   Thyroid Function Tests Recent Labs    09/01/21 0517  TSH 12.361*    Other results:   Imaging    ECHOCARDIOGRAM COMPLETE  Result Date: 09/01/2021    ECHOCARDIOGRAM REPORT   Patient Name:   Leonard [name] Date of Exam:  09/01/2021 Medical Rec #:  [id number]      Height:       71.0 in Accession #:    [id number]     Weight:       274.0 lb Date of Birth:  [birth year]       BSA:          2.410 m Patient Age:    46 years       BP:           146/110 mmHg Patient Gender: M              HR:           107 bpm. Exam Location:  Inpatient Procedure: 2D Echo Indications:    Cardiomyopathy  History:        Patient has prior history of Echocardiogram examinations, most                 recent 10/19/2016. CHF, COPD; Risk Factors:cocaine abuse.  Sonographer:    Johny Chess RDCS Referring Phys: [place]  1. Left ventricular ejection fraction, by estimation, is 30 to 35%. The left ventricle has moderately decreased function. The left ventricle demonstrates regional wall motion abnormalities (see scoring diagram/findings for description). There is mild left ventricular hypertrophy. Left ventricular diastolic parameters are indeterminate.   2. Right ventricular systolic function is mildly reduced. The right ventricular size is normal. There is moderately elevated pulmonary artery systolic pressure. The estimated right ventricular systolic pressure is 64.4 mmHg.  3. The mitral valve is normal in structure. Trivial mitral valve regurgitation.  4. Tricuspid valve regurgitation is mild to moderate.  5. The aortic valve is calcified. There is moderate calcification of the aortic valve. Aortic valve regurgitation is mild. Mild aortic valve stenosis.  6. Aortic dilatation noted. There is dilatation of the aortic root, measuring 40 mm.  7. The inferior vena cava is dilated in size with <50% respiratory variability, suggesting right atrial pressure of 15 mmHg. FINDINGS  Left Ventricle: Left ventricular ejection fraction, by estimation, is 30 to 35%. The left ventricle has moderately decreased function. The left ventricle demonstrates regional wall motion abnormalities. The left ventricular internal cavity size was normal in size. There is mild left ventricular hypertrophy. Left ventricular diastolic parameters are indeterminate.  LV Wall Scoring: The entire anterior wall and entire septum are akinetic. The apex is hypokinetic. The entire lateral wall and entire inferior wall are normal. Right Ventricle: The right ventricular size is normal. Right vetricular wall thickness was not well visualized. Right ventricular systolic function is mildly reduced. There is moderately elevated pulmonary artery systolic pressure. The tricuspid regurgitant velocity is 3.03 m/s, and with an assumed right atrial pressure of 15 mmHg, the estimated right ventricular systolic pressure is 03.4 mmHg. Left Atrium: Left atrial size was normal in size. Right Atrium: Right atrial size was normal in size. Pericardium: Trivial pericardial effusion is present. Mitral Valve: The mitral valve is normal in structure. Trivial mitral valve regurgitation. Tricuspid Valve: The tricuspid valve is  normal in structure. Tricuspid valve regurgitation is mild to moderate. Aortic Valve: The aortic valve is calcified. There is moderate calcification of the aortic valve. Aortic valve regurgitation is mild. Mild aortic stenosis is present. Aortic valve mean gradient measures 13.0 mmHg. Aortic valve peak gradient measures 22.7  mmHg. Aortic valve area, by VTI measures 3.36 cm. Pulmonic Valve: The pulmonic valve was grossly normal. Pulmonic valve regurgitation is not visualized. Aorta: Aortic dilatation noted. There is dilatation  of the aortic root, measuring 40 mm. Venous: The inferior vena cava is dilated in size with less than 50% respiratory variability, suggesting right atrial pressure of 15 mmHg. IAS/Shunts: The interatrial septum was not well visualized.  LEFT VENTRICLE PLAX 2D LVIDd:         5.60 cm      Diastology LVIDs:         4.70 cm      LV e' medial: 4.35 cm/s LV PW:         1.20 cm LV IVS:        1.00 cm LVOT diam:     2.60 cm LV SV:         122 LV SV Index:   51 LVOT Area:     5.31 cm  LV Volumes (MOD) LV vol d, MOD A4C: 133.0 ml LV vol s, MOD A4C: 96.3 ml LV SV MOD A4C:     133.0 ml RIGHT VENTRICLE            IVC RV S prime:     9.25 cm/s  IVC diam: 2.60 cm TAPSE (M-mode): 1.4 cm LEFT ATRIUM             Index        RIGHT ATRIUM           Index LA diam:        3.90 cm 1.62 cm/m   RA Area:     18.00 cm LA Vol (A2C):   49.0 ml 20.33 ml/m  RA Volume:   51.10 ml  21.20 ml/m LA Vol (A4C):   49.9 ml 20.70 ml/m LA Biplane Vol: 49.7 ml 20.62 ml/m  AORTIC VALVE AV Area (Vmax):    3.26 cm AV Area (Vmean):   3.10 cm AV Area (VTI):     3.36 cm AV Vmax:           238.00 cm/s AV Vmean:          164.000 cm/s AV VTI:            0.363 m AV Peak Grad:      22.7 mmHg AV Mean Grad:      13.0 mmHg LVOT Vmax:         146.00 cm/s LVOT Vmean:        95.900 cm/s LVOT VTI:          0.230 m LVOT/AV VTI ratio: 0.63  AORTA Ao Root diam: 4.00 cm Ao Asc diam:  3.30 cm TRICUSPID VALVE TR Peak grad:   36.7 mmHg TR Vmax:         303.00 cm/s  SHUNTS Systemic VTI:  0.23 m Systemic Diam: 2.60 cm Oswaldo Milian MD Electronically signed by Oswaldo Milian MD Signature Date/Time: 09/01/2021/10:54:01 AM    Final      Medications:     Scheduled Medications:  aspirin  81 mg Oral Daily   atorvastatin  40 mg Oral Daily   digoxin  0.125 mg Oral Daily   furosemide  80 mg Intravenous BID   gabapentin  200 mg Oral TID   levothyroxine  75 mcg Oral Q0600   mometasone-formoterol  2 puff Inhalation BID   pantoprazole  40 mg Oral Daily   pneumococcal 23 valent vaccine  0.5 mL Intramuscular Tomorrow-1000   spironolactone  12.5 mg Oral Daily   umeclidinium bromide  1 puff Inhalation Daily    Infusions:   PRN Medications: acetaminophen **OR** acetaminophen, albuterol, HYDROcodone-acetaminophen, LORazepam, traMADol  Patient Profile   46 y.o. male with history of chronic systolic CHF/NICM, non-Hodgkin's lymphoma s/p chemoradiation, HTN, noncompliance with medical therapy, hx cocaine and alcohol abuse. Admitted with acute on chronic systolic CHF  Assessment/Plan   Acute on chronic systolic CHF/NICM: -Diagnosed 03/2016- Echo 04/07/16 LVEF 25-30%, Grade 2 DD, Peak PA pressure 47 mm Hg.  - R/LHC 04/15/16: Mild CAD, RA 16, PCWP mean 26 mmHg, Fick CO 4.55/CI 1.98 - Echo 10/19/16 LVEF 40-45%, Trivial AI - Echo 12/22 at [facility]: EF 30-35%, RV okay - Echo to EF 30-35%, RV mildly reduced, RVSP 52 mmHg - Previously treated with Adriamycin for non-hodgkin's lymphoma. ? How much cocaine use contributing to cardiomyopathy. Denies recent alcohol, but had ED visits in 11/22 and 12/22 for alcohol abuse. Minimal PVCs on telemetry - Current admit with > 20 lb weight gain over last month despite increasing home diuretics.  - Stop intermittent lasix. Start lasix drip 15 mg per hour.  - Continue  digoxin 0.125 mg daily. Dig level < 0.2 - Continue  spiro 12.5 mg daily - Add entresto 24-26 mg twice a day.  - Off beta blocker with  history of cocaine use. Can eventually consider Carvedilol. - cMRI eventually to better assess etiology of CM - Sleep evaluation as outpatient -Needs to completely refrain from cocaine use   2. Sinus tachycardia: - Likely d/t #1 - Continue digoxin as above   3. CAD: - Mild on LHC in 2018 - HS troponin 51 > 53, likely d/t demand ischemia - Lipid panel. Add atorvastatin 40 - 81 mg aspirin if hgb remains stable   4. HTN: - Elevated but adding entresto.    5. H/o Non-Hodgkins Lymphoma - h/o of rx with adriamycin. Likely contributing factor to his cardiomyopathy.  - Now in remission   6. Syncope -Reports multiple episodes recently. ? How much substance abuse contributing. Dizziness with position changes and voiding.  -Recurrent problem for several years on chart review. -Monitor for arrhythmias on tele -Check orthostatics    7. Possible pneumonia -On IV abx -right mid lower lung airspace disease on CXR -AF  -Procalcitonin < 0.10   8. B/l lung abnormality on imaging -CTA chest 12/22: chronic collapse left upper lobe and focal obstruction left upper lobe bronchus.  Thick band soft tissue attenuation right upper lobe. Does not appear to be vasculature, may represent mass or collapsed lung parenchyma. Findings similar to prior CT 03/2020 -? If changes d/t prior radiation therapy   9. Hx iron deficiency anemia: -hgb trending down last few weeks, 11.9>10.4>10.6>9.8 -Iron sats low. Give feraheme   10. Hypothyroidism: -TSH 12.3, T3/Free T4 pending -On synthroid -Per TRH   11. Hypokalemia: -Check BMET    12. Hx cocaine and ETOH abuse: -Complete cessation discussed -UDS pending - Consult SW for substance abuse.    65. Noncompliance with medical therapy: -Reports better compliance with medications after recent hospitalization in December   14. Dental caries/abscesses: -2 recent coarses of abx -Reports he needs teeth pulled but has not been completed. States he needed  medical clearance by MD  RHC/LHC once diuresed. Set up for tomorrow.     Consult cardiac rehab. Consult SW for substance abuse.     Length of Stay: 1  Amy Clegg, NP  09/02/2021, 8:43 AM  Advanced Heart Failure Team Pager [phone] (M-F; 7a - 5p)  Please contact [facility] for night-coverage after hours (5p -7a ) and weekends on [email or web address]  Patient seen with NP, agree with the above note.  He diuresed well yesterday, net negative 3255.  No BMET yet today.  Feeling better.   General: NAD Neck: JVP 14+, no thyromegaly or thyroid nodule.  Lungs: Clear to auscultation bilaterally with normal respiratory effort. CV: Lateral PMI.  Heart regular S1/S2, no S3/S4, no murmur.  1+ edema to knees.   Abdomen: Soft, nontender, no hepatosplenomegaly, no distention.  Skin: Intact without lesions or rashes.  Neurologic: Alert and oriented x 3.  Psych: Normal affect. Extremities: No clubbing or cyanosis.  HEENT: Normal.   1. Acute on chronic systolic CHF: Long-standing cardiomyopathy, echo this admission with EF 30-35%, mildly decreased RV function. Cath in 2017 showed no coronary disease.  He has a history of cocaine as well as prior Adriamycin for non-Hodgkin's lymphoma, either of which may contribute to CMP. ?ETOH contributing => had ER visits with ETOH abuse but denies current heavy ETOH.  He is volume overloaded, admitted with 20 lb weight gain.  - Lasix gtt 15 mg/hr.  - Continue digoxin.  - Continue spironolactone 12.5 daily.  - Add back Entresto 24/26 bid.  - Needs BMET stat.  - LHC/RHC after diuresis, possibly tomorrow afternoon.  Discussed risks/benefits and patient agrees to procedure.  - Cardiac MRI if unremarkable cath.  2. Substance abuse: Cocaine, ?ETOH.  Advised cessation.  3. Hypothyroidism: Per primary service.  4. ?PNA: Right mid lung infiltrate but afebrile, PCT < 0.1. Off abx. Possible prior changes from radiation with lymphoma.  5. ?Syncope/presyncope: Possible  orthostatic events.  BP is elevated.  EF has been low but does not have ICD due to substance abuse.  6. Anemia: Fe deficiency.   - Will give feraheme.   Loralie Champagne 09/02/2021 10:02 AM

## 2021-09-02 NOTE — TOC Progression Note (Signed)
Transition of Care [facility]) - Progression Note    Patient Details  Name: Leonard [name] MRN: [id number] Date of Birth: Jun 12, 1976  [facility]) CM/SW Contact  Zenon Mayo, RN Phone Number: 09/02/2021, 8:25 AM  Clinical Narrative:     Transition of Care [facility]) Screening Note   Patient Details  Name: Leonard [name] Date of Birth: [birth year]   Transition of Care [facility]) CM/SW Contact:    Zenon Mayo, RN Phone Number: 09/02/2021, 8:25 AM    Transition of Care Department [facility]) has reviewed patient and no TOC needs have been identified at this time. We will continue to monitor patient advancement through interdisciplinary progression rounds. If new patient transition needs arise, please place a TOC consult.          Expected Discharge Plan and Services                                                 Social Determinants of Health (SDOH) Interventions    Readmission Risk Interventions No flowsheet data found.

## 2021-09-02 NOTE — TOC CAGE-AID Note (Addendum)
Transition of Care [facility]) - CAGE-AID Screening   Patient Details  Name: Leonard [name] MRN: [id number] Date of Birth: May 04, 1976  Transition of Care [facility]) CM/SW Contact:    Fatma Rutten, LCSW Phone Number: 09/02/2021, 2:23 PM   Clinical Narrative: HF CSW spoke with Leonard [name] at bedside to address the [facility] consult for reported cocaine use. Leonard [name] reported that he was using up to $500 worth of cocaine a day then cut back to $100 a day and now he feels he is ready to quit. Leonard [name] reported that he used 2 nights ago before coming to the hospital about $20 worth of cocaine. HF CSW completed a brief SDOH with Leonard [name] and provided substance use resources to him and Leonard [name] reported that he is interested in a program or at the very least speaking with a therapist. CSW to locate resources for Leonard [name]. Leonard [name] reports that he lives with his wife, Leonard [name] in a mobile home and she works for [employer] and he was working at [employer] also but he hasn't been able to maintain employment. Mr. Dimattia reported that his wife gets food stamps and he also needs to apply for food stamps and is aware to apply at Social Services/DSS. Leonard [name] reported that he has applied for disability before and was denied for disability 2.5 months ago but isn't sure why and that he could have appealed if he wanted to but he didn't pursue further. Leonard [name] is agreeable to work with the [facility] for disability. Leonard [name] reported that he doesn't have his drivers license and that he and his wife share a car between them. CSW to enrol in [facility] and set up rides as needed for follow up appointments.  CSW will continue to follow throughout discharge.   CAGE-AID Screening:    Have You Ever Felt You Ought to Cut Down on Your Drinking or Drug Use?: Yes Have People Annoyed You By Critizing Your Drinking Or Drug Use?: No Have You Felt Bad Or Guilty About Your Drinking Or Drug Use?: No Have You Ever Had a  Drink or Used Drugs First Thing In The Morning to Steady Your Nerves or to Get Rid of a Hangover?: Yes CAGE-AID Score: 2  Substance Abuse Education Offered: Yes  Substance abuse interventions: Scientist, clinical (histocompatibility and immunogenetics), SDOH Screening     [place], MSW, [place] Heart Failure Social Worker

## 2021-09-03 ENCOUNTER — Encounter (HOSPITAL_COMMUNITY)
Admission: EM | Disposition: A | Discharge status: Home / Self Care | Payer: Self-pay | Source: Home / Self Care | Attending: Internal Medicine

## 2021-09-03 ENCOUNTER — Encounter (HOSPITAL_COMMUNITY): Payer: Self-pay | Admitting: Cardiology

## 2021-09-03 ENCOUNTER — Other Ambulatory Visit (HOSPITAL_COMMUNITY): Payer: Self-pay

## 2021-09-03 DIAGNOSIS — I2510 Atherosclerotic heart disease of native coronary artery without angina pectoris: Secondary | ICD-10-CM

## 2021-09-03 DIAGNOSIS — R778 Other specified abnormalities of plasma proteins: Secondary | ICD-10-CM

## 2021-09-03 DIAGNOSIS — I429 Cardiomyopathy, unspecified: Secondary | ICD-10-CM

## 2021-09-03 DIAGNOSIS — F1410 Cocaine abuse, uncomplicated: Secondary | ICD-10-CM

## 2021-09-03 DIAGNOSIS — I5043 Acute on chronic combined systolic (congestive) and diastolic (congestive) heart failure: Secondary | ICD-10-CM | POA: Diagnosis not present

## 2021-09-03 DIAGNOSIS — R000 Tachycardia, unspecified: Secondary | ICD-10-CM

## 2021-09-03 DIAGNOSIS — R7989 Other specified abnormal findings of blood chemistry: Secondary | ICD-10-CM

## 2021-09-03 HISTORY — PX: RIGHT/LEFT HEART CATH AND CORONARY ANGIOGRAPHY: CATH118266

## 2021-09-03 LAB — POCT I-STAT EG7
Acid-Base Excess: 5 mmol/L — ABNORMAL HIGH (ref 0.0–2.0)
Acid-Base Excess: 4 mmol/L — ABNORMAL HIGH (ref 0.0–2.0)
Bicarbonate: 31.8 mmol/L — ABNORMAL HIGH (ref 20.0–28.0)
Bicarbonate: 30.9 mmol/L — ABNORMAL HIGH (ref 20.0–28.0)
Calcium, Ion: 1.17 mmol/L (ref 1.15–1.40)
Calcium, Ion: 1.13 mmol/L — ABNORMAL LOW (ref 1.15–1.40)
HCT: 40 % (ref 39.0–52.0)
HCT: 39 % (ref 39.0–52.0)
Hemoglobin: 13.6 g/dL (ref 13.0–17.0)
Hemoglobin: 13.3 g/dL (ref 13.0–17.0)
O2 Saturation: 74 %
O2 Saturation: 72 %
Potassium: 3.5 mmol/L (ref 3.5–5.1)
Potassium: 3.4 mmol/L — ABNORMAL LOW (ref 3.5–5.1)
Sodium: 140 mmol/L (ref 135–145)
Sodium: 141 mmol/L (ref 135–145)
TCO2: 33 mmol/L — ABNORMAL HIGH (ref 22–32)
TCO2: 33 mmol/L — ABNORMAL HIGH (ref 22–32)
pCO2, Ven: 53.4 mmHg (ref 44.0–60.0)
pCO2, Ven: 52.6 mmHg (ref 44.0–60.0)
pH, Ven: 7.383 (ref 7.250–7.430)
pH, Ven: 7.377 (ref 7.250–7.430)
pO2, Ven: 41 mmHg (ref 32.0–45.0)
pO2, Ven: 39 mmHg (ref 32.0–45.0)

## 2021-09-03 LAB — BASIC METABOLIC PANEL
Anion gap: 9 (ref 5–15)
BUN: 14 mg/dL (ref 6–20)
CO2: 31 mmol/L (ref 22–32)
Calcium: 8.8 mg/dL — ABNORMAL LOW (ref 8.9–10.3)
Chloride: 98 mmol/L (ref 98–111)
Creatinine, Ser: 1.01 mg/dL (ref 0.61–1.24)
GFR, Estimated: >60 mL/min (ref >60)
Glucose, Bld: 97 mg/dL (ref 70–99)
Potassium: 3.4 mmol/L — ABNORMAL LOW (ref 3.5–5.1)
Sodium: 138 mmol/L (ref 135–145)

## 2021-09-03 LAB — CBC
HCT: 42.3 % (ref 39.0–52.0)
Hemoglobin: 12.5 g/dL — ABNORMAL LOW (ref 13.0–17.0)
MCH: 24.1 pg — ABNORMAL LOW (ref 26.0–34.0)
MCHC: 29.6 g/dL — ABNORMAL LOW (ref 30.0–36.0)
MCV: 81.5 fL (ref 80.0–100.0)
Platelets: 378 10*3/uL (ref 150–400)
RBC: 5.19 MIL/uL (ref 4.22–5.81)
RDW: 22 % — ABNORMAL HIGH (ref 11.5–15.5)
WBC: 5 10*3/uL (ref 4.0–10.5)
nRBC: 0 % (ref 0.0–0.2)

## 2021-09-03 LAB — MAGNESIUM: Magnesium: 2.1 mg/dL (ref 1.7–2.4)

## 2021-09-03 SURGERY — RIGHT/LEFT HEART CATH AND CORONARY ANGIOGRAPHY
Anesthesia: LOCAL

## 2021-09-03 MED ORDER — SPIRONOLACTONE 25 MG PO TABS
25.0000 mg | ORAL_TABLET | Freq: Every day | ORAL | Status: DC
  Administered 2021-09-03 – 2021-09-05 (×3): 25 mg via ORAL
  Filled 2021-09-03 (×3): qty 1

## 2021-09-03 MED ORDER — SODIUM CHLORIDE 0.9% FLUSH
3.0000 mL | Freq: Two times a day (BID) | INTRAVENOUS | Status: DC
  Administered 2021-09-03 – 2021-09-05 (×4): 3 mL via INTRAVENOUS

## 2021-09-03 MED ORDER — HEPARIN SODIUM (PORCINE) 1000 UNIT/ML IJ SOLN
INTRAMUSCULAR | Status: DC | PRN
  Administered 2021-09-03: 6000 [IU] via INTRAVENOUS

## 2021-09-03 MED ORDER — ENOXAPARIN SODIUM 40 MG/0.4ML IJ SOSY: 40.0000 mg | PREFILLED_SYRINGE | INTRAMUSCULAR | Status: DC

## 2021-09-03 MED ORDER — FUROSEMIDE 10 MG/ML IJ SOLN
8.0000 mg/h | INTRAVENOUS | Status: DC
  Filled 2021-09-03: qty 20

## 2021-09-03 MED ORDER — SODIUM CHLORIDE 0.9 % IV SOLN: 250.0000 mL | INTRAVENOUS | Status: DC | PRN

## 2021-09-03 MED ORDER — FUROSEMIDE 10 MG/ML IJ SOLN
40.0000 mg | Freq: Once | INTRAMUSCULAR | Status: AC
  Administered 2021-09-03: 40 mg via INTRAVENOUS
  Filled 2021-09-03: qty 4

## 2021-09-03 MED ORDER — IOHEXOL 350 MG/ML SOLN
INTRAVENOUS | Status: DC | PRN
  Administered 2021-09-03: 45 mL

## 2021-09-03 MED ORDER — HEPARIN (PORCINE) IN NACL 1000-0.9 UT/500ML-% IV SOLN
INTRAVENOUS | Status: DC | PRN
  Administered 2021-09-03 (×2): 500 mL

## 2021-09-03 MED ORDER — FENTANYL CITRATE (PF) 100 MCG/2ML IJ SOLN
INTRAMUSCULAR | Status: AC
  Filled 2021-09-03: qty 2

## 2021-09-03 MED ORDER — MIDAZOLAM HCL 2 MG/2ML IJ SOLN
INTRAMUSCULAR | Status: DC | PRN
  Administered 2021-09-03: 1 mg via INTRAVENOUS

## 2021-09-03 MED ORDER — MIDAZOLAM HCL 2 MG/2ML IJ SOLN
INTRAMUSCULAR | Status: AC
  Filled 2021-09-03: qty 2

## 2021-09-03 MED ORDER — ACETAMINOPHEN 325 MG PO TABS: 650.0000 mg | ORAL_TABLET | ORAL | Status: DC | PRN

## 2021-09-03 MED ORDER — LABETALOL HCL 5 MG/ML IV SOLN: 10.0000 mg | INTRAVENOUS | Status: AC | PRN

## 2021-09-03 MED ORDER — POTASSIUM CHLORIDE CRYS ER 20 MEQ PO TBCR
40.0000 meq | EXTENDED_RELEASE_TABLET | Freq: Once | ORAL | Status: AC
  Administered 2021-09-03: 40 meq via ORAL
  Filled 2021-09-03: qty 2

## 2021-09-03 MED ORDER — POLYETHYLENE GLYCOL 3350 17 G PO PACK
17.0000 g | PACK | Freq: Every day | ORAL | Status: DC | PRN
  Administered 2021-09-04: 17 g via ORAL
  Filled 2021-09-03: qty 1

## 2021-09-03 MED ORDER — DAPAGLIFLOZIN PROPANEDIOL 10 MG PO TABS
10.0000 mg | ORAL_TABLET | Freq: Every day | ORAL | Status: DC
  Administered 2021-09-03 – 2021-09-05 (×3): 10 mg via ORAL
  Filled 2021-09-03 (×3): qty 1

## 2021-09-03 MED ORDER — VERAPAMIL HCL 2.5 MG/ML IV SOLN
INTRAVENOUS | Status: DC | PRN
  Administered 2021-09-03: 10 mL via INTRA_ARTERIAL

## 2021-09-03 MED ORDER — ONDANSETRON HCL 4 MG/2ML IJ SOLN: 4.0000 mg | Freq: Four times a day (QID) | INTRAMUSCULAR | Status: DC | PRN

## 2021-09-03 MED ORDER — HEPARIN (PORCINE) IN NACL 1000-0.9 UT/500ML-% IV SOLN
INTRAVENOUS | Status: AC
  Filled 2021-09-03: qty 1000

## 2021-09-03 MED ORDER — HEPARIN SODIUM (PORCINE) 1000 UNIT/ML IJ SOLN
INTRAMUSCULAR | Status: AC
  Filled 2021-09-03: qty 10

## 2021-09-03 MED ORDER — SODIUM CHLORIDE 0.9% FLUSH: 3.0000 mL | INTRAVENOUS | Status: DC | PRN

## 2021-09-03 MED ORDER — LIDOCAINE HCL (PF) 1 % IJ SOLN
INTRAMUSCULAR | Status: AC
  Filled 2021-09-03: qty 30

## 2021-09-03 MED ORDER — FENTANYL CITRATE (PF) 100 MCG/2ML IJ SOLN
INTRAMUSCULAR | Status: DC | PRN
  Administered 2021-09-03: 25 ug via INTRAVENOUS

## 2021-09-03 MED ORDER — VERAPAMIL HCL 2.5 MG/ML IV SOLN
INTRAVENOUS | Status: AC
  Filled 2021-09-03: qty 2

## 2021-09-03 MED ORDER — HYDRALAZINE HCL 20 MG/ML IJ SOLN: 10.0000 mg | INTRAMUSCULAR | Status: AC | PRN

## 2021-09-03 MED ORDER — LIDOCAINE HCL (PF) 1 % IJ SOLN
INTRAMUSCULAR | Status: DC | PRN
  Administered 2021-09-03 (×2): 2 mL

## 2021-09-03 SURGICAL SUPPLY — 11 items
CATH 5FR JL3.5 JR4 ANG PIG MP (CATHETERS) ×1 IMPLANT
CATH BALLN WEDGE 5F 110CM (CATHETERS) ×1 IMPLANT
DEVICE RAD COMP TR BAND LRG (VASCULAR PRODUCTS) ×1 IMPLANT
GLIDESHEATH SLEND SS 6F .021 (SHEATH) ×1 IMPLANT
GUIDEWIRE INQWIRE 1.5J.035X260 (WIRE) IMPLANT
INQWIRE 1.5J .035X260CM (WIRE) ×2
KIT HEART LEFT (KITS) ×2 IMPLANT
PACK CARDIAC CATHETERIZATION (CUSTOM PROCEDURE TRAY) ×2 IMPLANT
SHEATH GLIDE SLENDER 4/5FR (SHEATH) ×1 IMPLANT
TRANSDUCER W/STOPCOCK (MISCELLANEOUS) ×2 IMPLANT
TUBING CIL FLEX 10 FLL-RA (TUBING) ×1 IMPLANT

## 2021-09-03 NOTE — TOC Benefit Eligibility Note (Signed)
Patient Teacher, English as a foreign language completed.    The patient is currently admitted and upon discharge could be taking Entresto 24-26 mg.  The current 30 day co-pay is, $4.00.   The patient is currently admitted and upon discharge could be taking Farxiga 10 mg.  Requires Prior Authorization   The patient is insured through [place], [place] Patient Advocate Specialist [place] Patient Advocate Team Direct Number: [phone]  Fax: [phone]

## 2021-09-03 NOTE — Progress Notes (Signed)
Mobility Specialist Progress Note:   09/03/21 1231  Mobility  Activity Off unit   Will follow-up as time allows.   [facility] Public librarian Phone [phone] Secondary Phone [phone]

## 2021-09-03 NOTE — Interval H&P Note (Signed)
History and Physical Interval Note:  09/03/2021 12:51 PM  Leonard [name]  has presented today for surgery, with the diagnosis of heart failure.  The various methods of treatment have been discussed with the patient and family. After consideration of risks, benefits and other options for treatment, the patient has consented to  Procedure(s): RIGHT/LEFT HEART CATH AND CORONARY ANGIOGRAPHY (N/A) as a surgical intervention.  The patient's history has been reviewed, patient examined, no change in status, stable for surgery.  I have reviewed the patient's chart and labs.  Questions were answered to the patient's satisfaction.     [name]

## 2021-09-03 NOTE — TOC Benefit Eligibility Note (Signed)
Patient Advocate Encounter  Prior Authorization for Farxiga 10 mg has been approved.    PA# [id number] Effective dates: 09/03/2021 through 09/03/2022  Patients co-pay is $4.00.     Lyndel Safe, [place] Patient Advocate Specialist [place] Patient Advocate Team Direct Number: [phone]  Fax: [phone]

## 2021-09-03 NOTE — Plan of Care (Signed)
°  Problem: Cardiac: Goal: Ability to achieve and maintain adequate cardiopulmonary perfusion will improve Outcome: Progressing   Problem: Activity: Goal: Ability to return to baseline activity level will improve Outcome: Progressing   Problem: Cardiovascular: Goal: Ability to achieve and maintain adequate cardiovascular perfusion will improve Outcome: Progressing

## 2021-09-03 NOTE — Progress Notes (Signed)
IV team consulted @2000  06/02/22 to place 2nd IV site for patient's heart cath @1300  06/03/22. Per IV team: " Please put in the order for PIV placement 1-2 hours before heart cath to decrease risk of clotting/dislodement of PIV". IV team order cannot be placed until 1100 AM.

## 2021-09-03 NOTE — Progress Notes (Signed)
Wrong patient

## 2021-09-03 NOTE — Progress Notes (Addendum)
Advanced Heart Failure Rounding Note  PCP-Cardiologist: None   Subjective:    -9.6L yesterday with lasix gtt. Weight down 15 lb overnight, 19 lb since admit.  Dyspnea somewhat improved but not back to baseline. Abdomen and legs still feel swollen.   BP okay.   Scr stable. K 3.4.    Objective:   Weight Range: 116 kg Body mass index is 35.66 kg/m.   Vital Signs:   Temp:  [97.8 F (36.6 C)-98 F (36.7 C)] 97.9 F (36.6 C) (01/11 0718) Pulse Rate:  [94-105] 96 (01/11 0718) Resp:  [20-27] 27 (01/11 0718) BP: (113-139)/(84-96) 114/88 (01/11 0718) SpO2:  [92 %-99 %] 92 % (01/11 0718) Weight:  [845 kg] 116 kg (01/11 0400)    Weight change: Filed Weights   08/31/21 1911 09/02/21 0446 09/03/21 0400  Weight: 124.3 kg 122.8 kg 116 kg    Intake/Output:   Intake/Output Summary (Last 24 hours) at 09/03/2021 0921 Last data filed at 09/03/2021 0700 Gross per 24 hour  Intake 1200 ml  Output 9875 ml  Net -8675 ml      Physical Exam    General:  No distress. Sitting up on side of bed. HEENT: normal Neck: supple. JVP ~ 10 cm. Carotids 2+ bilat; no bruits. No lymphadenopathy or thryomegaly appreciated. Cor: PMI nondisplaced. Regular rate & rhythm, tachy. No rubs, gallops or murmurs. Lungs: bibasilar crackles with scattered expiratory wheezes Abdomen: soft, nontender, nondistended, obese. No hepatosplenomegaly. No bruits or masses. Good bowel sounds. Extremities: no cyanosis, clubbing, rash, 1+ edema Neuro: alert & orientedx3, cranial nerves grossly intact. moves all 4 extremities w/o difficulty. Affect pleasant    Telemetry   Sinus rhythm/sinus tach, 90s-low 100s  EKG    N/A   Labs    CBC Recent Labs    08/31/21 1921 09/01/21 0517  WBC 5.6 7.4  NEUTROABS  --  6.1  HGB 10.6* 9.8*  HCT 35.7* 33.1*  MCV 83.2 81.9  PLT 308 364   Basic Metabolic Panel Recent Labs    08/31/21 2222 09/01/21 0517 09/02/21 0508 09/03/21 0355  NA  --  132* 136 138  K   --  3.2* 3.6 3.4*  CL  --  97* 97* 98  CO2  --  26 28 31   GLUCOSE  --  127* 146* 97  BUN  --  15 19 14   CREATININE  --  0.90 0.98 1.01  CALCIUM  --  8.6* 8.6* 8.8*  MG 2.1 2.0  --   --   PHOS 3.1 2.9  --   --    Liver Function Tests Recent Labs    08/31/21 2222 09/01/21 0517  AST 35 31  ALT 31 31  ALKPHOS 102 94  BILITOT 0.4 0.7  PROT 7.5 7.2  ALBUMIN 3.8 3.7   No results for input(s): LIPASE, AMYLASE in the last 72 hours. Cardiac Enzymes Recent Labs    08/31/21 2222  CKTOTAL 185    BNP: BNP (last 3 results) Recent Labs    08/16/21 1810 08/20/21 2252 08/31/21 1921  BNP 449.6* 956.1* 1,530.6*    ProBNP (last 3 results) No results for input(s): PROBNP in the last 8760 hours.   D-Dimer No results for input(s): DDIMER in the last 72 hours. Hemoglobin A1C No results for input(s): HGBA1C in the last 72 hours. Fasting Lipid Panel Recent Labs    09/02/21 0508  CHOL 165  HDL 37*  LDLCALC 104*  TRIG 121  CHOLHDL 4.5   Thyroid  Function Tests Recent Labs    09/01/21 0517  TSH 12.361*    Other results:   Imaging    No results found.   Medications:     Scheduled Medications:  amoxicillin-clavulanate  1 tablet Oral Q12H   aspirin  81 mg Oral Daily   atorvastatin  40 mg Oral Daily   digoxin  0.125 mg Oral Daily   enoxaparin (LOVENOX) injection  60 mg Subcutaneous Q24H   gabapentin  200 mg Oral TID   levothyroxine  75 mcg Oral Q0600   mometasone-formoterol  2 puff Inhalation BID   pantoprazole  40 mg Oral Daily   sacubitril-valsartan  1 tablet Oral BID   sodium chloride flush  3 mL Intravenous Q12H   spironolactone  12.5 mg Oral Daily   umeclidinium bromide  1 puff Inhalation Daily    Infusions:  sodium chloride     sodium chloride     furosemide (LASIX) 200 mg in dextrose 5% 100 mL (2mg /mL) infusion 15 mg/hr (09/03/21 0027)    PRN Medications: sodium chloride, acetaminophen **OR** acetaminophen, albuterol, HYDROcodone-acetaminophen,  LORazepam, sodium chloride flush, traMADol    Patient Profile   46 y.o. male with history of chronic systolic CHF/NICM, non-Hodgkin's lymphoma s/p chemoradiation, HTN, noncompliance with medical therapy, hx cocaine and alcohol abuse. Admitted with acute on chronic systolic CHF  Assessment/Plan   Acute on chronic systolic CHF/NICM: -Diagnosed 03/2016- Echo 04/07/16 LVEF 25-30%, Grade 2 DD, Peak PA pressure 47 mm Hg.  - R/LHC 04/15/16: Mild CAD, RA 16, PCWP mean 26 mmHg, Fick CO 4.55/CI 1.98 - Echo 10/19/16 LVEF 40-45%, Trivial AI - Echo 12/22 at [facility]: EF 30-35%, RV okay - Echo to EF 30-35%, RV mildly reduced, RVSP 52 mmHg - Previously treated with Adriamycin for non-hodgkin's lymphoma. ? How much cocaine use contributing to cardiomyopathy, heavy use for many years. Denies recent alcohol, but had ED visits in 11/22 and 12/22 for alcohol abuse. Minimal PVCs on telemetry - Current admit with > 20 lb weight gain. Now down 19 lb from admit with lasix gtt. Will reduce to 8/hr and reassess pending RHC today - Continue  digoxin 0.125 mg daily. Dig level < 0.2 - Increase spiro to 25 mg daily - Continue entresto 24-26 mg BID - Off beta blocker with history of cocaine use. Can eventually consider Carvedilol. - cMRI eventually to better assess etiology of CM - Sleep evaluation as outpatient -Needs to completely refrain from cocaine use - R/LHC today   2. Sinus tachycardia: - Likely d/t #1 - Continue digoxin as above   3. CAD: - Mild on LHC in 2017 - Repeat LHC this admit - HS troponin 51 > 53, likely d/t demand ischemia - atorvastatin 40 - 81 mg aspirin    4. HTN: - BP improved with GDMT    5. H/o Non-Hodgkins Lymphoma - h/o of rx with adriamycin. Likely contributing factor to his cardiomyopathy.  - Now in remission   6. Syncope -Reports multiple episodes recently. ? How much substance abuse contributing. Dizziness with position changes and voiding.  -Recurrent problem for several  years on chart review. -Monitor for arrhythmias on tele -Check orthostatics    7. Possible pneumonia -right mid lower lung airspace disease on CXR -AF  -Procalcitonin < 0.10 - On augmentin   8. B/l lung abnormality on imaging -CTA chest 12/22: chronic collapse left upper lobe and focal obstruction left upper lobe bronchus.  Thick band soft tissue attenuation right upper lobe. Does not appear to be  vasculature, may represent mass or collapsed lung parenchyma. Findings similar to prior CT 03/2020 -? If changes d/t prior radiation therapy   9. Hx iron deficiency anemia: -hgb trending down last few weeks, 11.9>10.4>10.6>9.8 -recheck CBC -Iron sats low. Received feraheme   10. Hypothyroidism: -TSH 12.3, Free T4 low -On synthroid -Per TRH   11. Hypokalemia: -K 3.4 -Supp -Check magnesium   12. Hx cocaine and ETOH abuse: -Complete cessation discussed -CSW consulted - interested in substance abuse program   13. Noncompliance with medical therapy: -Reports better compliance with medications after recent hospitalization in December   14. Dental caries/abscesses: -2 recent coarses of abx -Reports he needs teeth pulled but has not been completed. 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 -Working on applying for [employer] and disability -Has Medicaid    Length of Stay: 2  FINCH, LINDSAY N, PA-C  09/03/2021, 9:21 AM  Advanced Heart Failure Team Pager [phone] (M-F; 7a - 5p)  Please contact [facility] for night-coverage after hours (5p -7a ) and weekends on [email or web address]  Patient seen with PA, agree with the above note.    Excellent diuresis overnight, weight coming down.    RHC/LHC as below: Coronary Findings  Diagnostic Dominance: Right Left Anterior Descending  Ost LAD lesion is 30% stenosed.  Prox LAD lesion is 40% stenosed.    Left Circumflex  Mid Cx lesion is 30% stenosed.     Intervention   No interventions have been documented.   Right Heart  Right Heart Pressures RHC Procedural Findings: Hemodynamics (mmHg0 RA mean 8 RV 40/8 PA 44/24, mean 32 PCWP mean 13 LV 123/20 AO 103/71  Oxygen saturations: PA 73% AO 100%  Cardiac Output (Fick) 8.65  Cardiac Index (Fick) 3.7 PVR 2.2 WU   General: NAD Neck: JVP 8 cm, no thyromegaly or thyroid nodule.  Lungs: Clear.  CV: Lateral PMI.  Heart regular S1/S2, no S3/S4, no murmur.  Trace anke edema.  No carotid bruit.  Normal pedal pulses.  Abdomen: Soft, nontender, no hepatosplenomegaly, no distention.  Skin: Intact without lesions or rashes.  Neurologic: Alert and oriented x 3.  Psych: Normal affect. Extremities: No clubbing or cyanosis.  HEENT: Normal.   Excellent diuresis overnight, weight coming down.  Filling pressures near-optimized on cath.  - Stop Lasix gtt, will give Lasix 40 mg IV this evening and then reassess tomorrow. Likely to po.  - Increase spironolactone to 25 mg daily.  - Add dapagliflozin 10 mg daily.   - Cardiac MRI tomorrow.   Loralie Champagne 09/03/2021 1:42 PM

## 2021-09-03 NOTE — TOC Benefit Eligibility Note (Signed)
Patient Advocate Encounter   Received notification that prior authorization for Farxiga 10 mg is required.   PA submitted on 09/03/2021 Key [id number] W Status is pending       Lyndel Safe, [place] Patient Advocate Specialist [place] Patient Advocate Team Direct Number: [phone]  Fax: [phone]

## 2021-09-03 NOTE — Progress Notes (Signed)
Patient off the floor to cath lab

## 2021-09-03 NOTE — Progress Notes (Signed)
PROGRESS NOTE    Leonard [name]  FHL:[id number] DOB: 04-Jul-1976 DOA: 08/31/2021 PCP: Secundino Ginger, PA-C    Brief Narrative:  Leonard [name] was admitted to the hospital with the working diagnosis of acute on chronic systolic heart failure exacerbation.   46 yo male with the past medical history of systolic heart failure, substance abuse (cocaine) and non Hodgkin's lymphoma now on remission. Last chemotherapy in 2014. He presented with dyspnea and chest pain. Reported worsening dyspnea along with 15 lbs wight gain, positive chest pain. Not compliant with oral medications. Patient incarcerated. On his initial physical examination his blood pressure was 157/111, HR 122, RR 18, temp 97,5 and oxygen saturation 100% on supplemental 02. Heart with S1 and S2 present with no gallop or murmurs, lungs with no wheezing, abdomen soft, positive lower extremity edema.   Na 133, K 3,7, Cl 98, bicarb 27, BUN 19 and cr 1,0 BNP 1,530.6 High sensitive troponin 51 and 53. Wbc 5,6, hgb 10,6 and hct 35,7. Plt 308. SARS COVID-19 negative.  Chest radiograph with bilateral interstitial infiltrates predominantly at bases, with hilar vascular congestion. Personally reviewed.   EKG 121 bpm, right axis deviation, normal intervals, sinus rhythm, poor r wave progression, with no significant ST segment changes with negative T wave, in leads II, III, avf, V5 and V6.   Patient has been placed on aggressive diuresis with furosemide drip.   Assessment & Plan:   Principal Problem:   Acute on chronic combined systolic and diastolic CHF (congestive heart failure) (HCC) Active Problems:   TSH elevation   Cocaine abuse (HCC)   Anemia   COPD (chronic obstructive pulmonary disease) (HCC)   Sinus tachycardia   Elevated troponin   Acute on chronic systolic heart failure exacerbation. Patient not compliant with medical therapy.  Continue to have dyspnea, edema and positive JVD.  Urine output over last 24 hrs is 11.425  ml Warm lower extremities, systolic blood pressure 373 to 136 mmHg   Plan to continue diuresis with IV furosemide (drip), to further target negative fluid balance.  Continue heart failure management with dogoxin and entresto. Spironolactone.  Today for diagnostic heart catheterization.   2, Hypothyroid. Continue levothyroxine.   3. Neuropathy . Continue with gabapentin   4. Obesity class 2. Calculated BMI is 35,6   5. NHL/ iron deficiency anemia. Lymphoma in remission  Patient had IV iron, follow up iron stores as outpatient.  6. Periodontal infection. Continue with Augmentin for antibiotic therapy.  Pneumonia ruled out.   7. Substance abuse. Cocaine, no signs of intoxication or withdrawal.   8. COPD. No signs of exacerbation   Patient continue to be at high risk for worsening heart failure   Status is: Inpatient  Remains inpatient appropriate because: IV diuresis        DVT prophylaxis:  Enoxaparin   Code Status:    full  Family Communication:  I spoke with patient's girl friend at the bedside, we talked in detail about patient's condition, plan of care and prognosis and all questions were addressed.     Consultants:  Cardiology   Antimicrobials:  Augmentin for periodontal infection     Subjective: Patient continue to have dyspnea and lower extremity edema, no chest pain, no nausea or vomiting.   Objective: Vitals:   09/02/21 1935 09/02/21 2343 09/03/21 0400 09/03/21 0718  BP: 129/89 120/84 113/87 114/88  Pulse: (!) 105  94 96  Resp: 20 20 20  (!) 27  Temp: 98 F (36.7 C) 98 F (36.7  C) 97.8 F (36.6 C) 97.9 F (36.6 C)  TempSrc: Oral  Oral Oral  SpO2: 99% 99% 94% 92%  Weight:   116 kg   Height:        Intake/Output Summary (Last 24 hours) at 09/03/2021 0932 Last data filed at 09/03/2021 0700 Gross per 24 hour  Intake 1200 ml  Output 9875 ml  Net -8675 ml   Filed Weights   08/31/21 1911 09/02/21 0446 09/03/21 0400  Weight: 124.3 kg 122.8 kg  116 kg    Examination:   General: Not in pain or dyspnea, deconditioned Neurology: Awake and alert, non focal  E ENT: mild pallor, no icterus, oral mucosa moist Cardiovascular:  positive moderate JVD. S1-S2 present, rhythmic, no gallops, rubs, or murmurs. trace lower extremity edema. Pulmonary: positive breath sounds bilaterally, with no wheezing, rhonchi or rales. Gastrointestinal. Abdomen soft and non tender Skin. No rashes Musculoskeletal: no joint deformities     Data Reviewed: I have personally reviewed following labs and imaging studies  CBC: Recent Labs  Lab 08/31/21 1921 09/01/21 0517  WBC 5.6 7.4  NEUTROABS  --  6.1  HGB 10.6* 9.8*  HCT 35.7* 33.1*  MCV 83.2 81.9  PLT 308 412   Basic Metabolic Panel: Recent Labs  Lab 08/31/21 1921 08/31/21 2222 09/01/21 0517 09/02/21 0508 09/03/21 0355  NA 133*  --  132* 136 138  K 3.7  --  3.2* 3.6 3.4*  CL 98  --  97* 97* 98  CO2 27  --  26 28 31   GLUCOSE 118*  --  127* 146* 97  BUN 19  --  15 19 14   CREATININE 1.00  --  0.90 0.98 1.01  CALCIUM 8.2*  --  8.6* 8.6* 8.8*  MG  --  2.1 2.0  --   --   PHOS  --  3.1 2.9  --   --    GFR: Estimated Creatinine Clearance: 122.2 mL/min (by C-G formula based on SCr of 1.01 mg/dL). Liver Function Tests: Recent Labs  Lab 08/31/21 2222 09/01/21 0517  AST 35 31  ALT 31 31  ALKPHOS 102 94  BILITOT 0.4 0.7  PROT 7.5 7.2  ALBUMIN 3.8 3.7   No results for input(s): LIPASE, AMYLASE in the last 168 hours. No results for input(s): AMMONIA in the last 168 hours. Coagulation Profile: No results for input(s): INR, PROTIME in the last 168 hours. Cardiac Enzymes: Recent Labs  Lab 08/31/21 2222  CKTOTAL 185   BNP (last 3 results) No results for input(s): PROBNP in the last 8760 hours. HbA1C: No results for input(s): HGBA1C in the last 72 hours. CBG: No results for input(s): GLUCAP in the last 168 hours. Lipid Profile: Recent Labs    09/02/21 0508  CHOL 165  HDL 37*   LDLCALC 104*  TRIG 121  CHOLHDL 4.5   Thyroid Function Tests: Recent Labs    09/01/21 0206 09/01/21 0517  TSH  --  12.361*  FREET4 0.53*  --    Anemia Panel: Recent Labs    09/01/21 0206 09/01/21 0517  VITAMINB12 267  --   FOLATE 24.8  --   FERRITIN 37  --   TIBC 599*  --   IRON 33*  --   RETICCTPCT  --  1.7      Radiology Studies: I have reviewed all of the imaging during this hospital visit personally     Scheduled Meds:  amoxicillin-clavulanate  1 tablet Oral Q12H   aspirin  81  mg Oral Daily   atorvastatin  40 mg Oral Daily   digoxin  0.125 mg Oral Daily   enoxaparin (LOVENOX) injection  60 mg Subcutaneous Q24H   gabapentin  200 mg Oral TID   levothyroxine  75 mcg Oral Q0600   mometasone-formoterol  2 puff Inhalation BID   pantoprazole  40 mg Oral Daily   sacubitril-valsartan  1 tablet Oral BID   sodium chloride flush  3 mL Intravenous Q12H   spironolactone  12.5 mg Oral Daily   umeclidinium bromide  1 puff Inhalation Daily   Continuous Infusions:  sodium chloride     sodium chloride     furosemide (LASIX) 200 mg in dextrose 5% 100 mL (2mg /mL) infusion 15 mg/hr (09/03/21 0027)     LOS: 2 days        Tiya Schrupp Gerome Apley, MD

## 2021-09-04 ENCOUNTER — Inpatient Hospital Stay (HOSPITAL_COMMUNITY): Payer: Medicaid Other

## 2021-09-04 DIAGNOSIS — I5043 Acute on chronic combined systolic (congestive) and diastolic (congestive) heart failure: Secondary | ICD-10-CM | POA: Diagnosis not present

## 2021-09-04 DIAGNOSIS — D649 Anemia, unspecified: Secondary | ICD-10-CM

## 2021-09-04 DIAGNOSIS — J449 Chronic obstructive pulmonary disease, unspecified: Secondary | ICD-10-CM

## 2021-09-04 LAB — BASIC METABOLIC PANEL
Anion gap: 9 (ref 5–15)
BUN: 10 mg/dL (ref 6–20)
CO2: 27 mmol/L (ref 22–32)
Calcium: 9.2 mg/dL (ref 8.9–10.3)
Chloride: 96 mmol/L — ABNORMAL LOW (ref 98–111)
Creatinine, Ser: 1.11 mg/dL (ref 0.61–1.24)
GFR, Estimated: >60 mL/min (ref >60)
Glucose, Bld: 94 mg/dL (ref 70–99)
Potassium: 4 mmol/L (ref 3.5–5.1)
Sodium: 132 mmol/L — ABNORMAL LOW (ref 135–145)

## 2021-09-04 LAB — T3: T3, Total: 126 ng/dL (ref 71–180)

## 2021-09-04 LAB — MAGNESIUM: Magnesium: 2.1 mg/dL (ref 1.7–2.4)

## 2021-09-04 MED ORDER — TORSEMIDE 20 MG PO TABS
20.0000 mg | ORAL_TABLET | Freq: Every day | ORAL | Status: DC
  Administered 2021-09-04 – 2021-09-05 (×2): 20 mg via ORAL
  Filled 2021-09-04 (×2): qty 1

## 2021-09-04 MED ORDER — GADOBUTROL 1 MMOL/ML IV SOLN
10.0000 mL | Freq: Once | INTRAVENOUS | Status: AC | PRN
  Administered 2021-09-04: 10 mL via INTRAVENOUS

## 2021-09-04 MED ORDER — SACUBITRIL-VALSARTAN 49-51 MG PO TABS
1.0000 | ORAL_TABLET | Freq: Two times a day (BID) | ORAL | Status: DC
  Administered 2021-09-04 – 2021-09-05 (×3): 1 via ORAL
  Filled 2021-09-04 (×3): qty 1

## 2021-09-04 MED ORDER — FUROSEMIDE 40 MG PO TABS: 40.0000 mg | ORAL_TABLET | Freq: Every day | ORAL | Status: DC

## 2021-09-04 MED ORDER — CARVEDILOL 3.125 MG PO TABS
3.1250 mg | ORAL_TABLET | Freq: Two times a day (BID) | ORAL | Status: DC
  Administered 2021-09-04 – 2021-09-05 (×2): 3.125 mg via ORAL
  Filled 2021-09-04 (×2): qty 1

## 2021-09-04 NOTE — Progress Notes (Addendum)
Advanced Heart Failure Rounding Note  PCP-Cardiologist: None   Subjective:   1/10 RHC/LHC  Prox LAD lesion is 40% stenosed.   Mid Cx lesion is 30% stenosed.   Ost LAD lesion is 30% stenosed.  1. Mild nonobstructive CAD, nonischemic cardiomyopathy.  2. Filling pressures near normal.  3. Mild pulmonary venous hypertension.  4. Preserved cardiac output.   Denies SOB.     Objective:   Weight Range: 114.3 kg Body mass index is 35.13 kg/m.   Vital Signs:   Temp:  [97.6 F (36.4 C)-98.1 F (36.7 C)] 97.6 F (36.4 C) (01/12 0347) Pulse Rate:  [84-108] 102 (01/12 0347) Resp:  [11-27] 20 (01/12 0347) BP: (92-145)/(40-99) 141/92 (01/12 0347) SpO2:  [78 %-100 %] 99 % (01/12 0347) Weight:  [114.3 kg] 114.3 kg (01/12 0347)    Weight change: Filed Weights   09/02/21 0446 09/03/21 0400 09/04/21 0347  Weight: 122.8 kg 116 kg 114.3 kg    Intake/Output:   Intake/Output Summary (Last 24 hours) at 09/04/2021 0703 Last data filed at 09/04/2021 0533 Gross per 24 hour  Intake 720 ml  Output 3050 ml  Net -2330 ml      Physical Exam    General:  No resp difficulty HEENT: normal Neck: supple. no JVD. Carotids 2+ bilat; no bruits. No lymphadenopathy or thryomegaly appreciated. Cor: PMI nondisplaced. Tachy Regular rate & rhythm. No rubs, gallops or murmurs. Lungs: clear Abdomen: soft, nontender, nondistended. No hepatosplenomegaly. No bruits or masses. Good bowel sounds. Extremities: no cyanosis, clubbing, rash, edema.  Neuro: alert & orientedx3, cranial nerves grossly intact. moves all 4 extremities w/o difficulty. Affect pleasant   Telemetry  SR-ST 90-100s   EKG    N/A   Labs    CBC Recent Labs    09/03/21 1003 09/03/21 1304 09/03/21 1305  WBC 5.0  --   --   HGB 12.5* 13.3 13.6  HCT 42.3 39.0 40.0  MCV 81.5  --   --   PLT 378  --   --    Basic Metabolic Panel Recent Labs    09/03/21 0355 09/03/21 1003 09/03/21 1304 09/03/21 1305 09/04/21 0322  NA  138  --    < > 140 132*  K 3.4*  --    < > 3.5 4.0  CL 98  --   --   --  96*  CO2 31  --   --   --  27  GLUCOSE 97  --   --   --  94  BUN 14  --   --   --  10  CREATININE 1.01  --   --   --  1.11  CALCIUM 8.8*  --   --   --  9.2  MG  --  2.1  --   --  2.1   < > = values in this interval not displayed.   Liver Function Tests No results for input(s): AST, ALT, ALKPHOS, BILITOT, PROT, ALBUMIN in the last 72 hours.  No results for input(s): LIPASE, AMYLASE in the last 72 hours. Cardiac Enzymes No results for input(s): CKTOTAL, CKMB, CKMBINDEX, TROPONINI in the last 72 hours.   BNP: BNP (last 3 results) Recent Labs    08/16/21 1810 08/20/21 2252 08/31/21 1921  BNP 449.6* 956.1* 1,530.6*    ProBNP (last 3 results) No results for input(s): PROBNP in the last 8760 hours.   D-Dimer No results for input(s): DDIMER in the last 72 hours. Hemoglobin A1C No  results for input(s): HGBA1C in the last 72 hours. Fasting Lipid Panel Recent Labs    09/02/21 0508  CHOL 165  HDL 37*  LDLCALC 104*  TRIG 121  CHOLHDL 4.5   Thyroid Function Tests No results for input(s): TSH, T4TOTAL, T3FREE, THYROIDAB in the last 72 hours.  Invalid input(s): FREET3   Other results:   Imaging    CARDIAC CATHETERIZATION  Result Date: 09/03/2021   Prox LAD lesion is 40% stenosed.   Mid Cx lesion is 30% stenosed.   Ost LAD lesion is 30% stenosed. 1. Mild nonobstructive CAD, nonischemic cardiomyopathy. 2. Filling pressures near normal. 3. Mild pulmonary venous hypertension. 4. Preserved cardiac output.     Medications:     Scheduled Medications:  amoxicillin-clavulanate  1 tablet Oral Q12H   aspirin  81 mg Oral Daily   atorvastatin  40 mg Oral Daily   dapagliflozin propanediol  10 mg Oral Daily   digoxin  0.125 mg Oral Daily   enoxaparin (LOVENOX) injection  60 mg Subcutaneous Q24H   gabapentin  200 mg Oral TID   levothyroxine  75 mcg Oral Q0600   mometasone-formoterol  2 puff Inhalation  BID   pantoprazole  40 mg Oral Daily   sacubitril-valsartan  1 tablet Oral BID   sodium chloride flush  3 mL Intravenous Q12H   sodium chloride flush  3 mL Intravenous Q12H   spironolactone  25 mg Oral Daily   umeclidinium bromide  1 puff Inhalation Daily    Infusions:  sodium chloride      PRN Medications: sodium chloride, acetaminophen **OR** acetaminophen, acetaminophen, albuterol, HYDROcodone-acetaminophen, LORazepam, ondansetron (ZOFRAN) IV, polyethylene glycol, sodium chloride flush, traMADol    Patient Profile   46 y.o. male with history of chronic systolic CHF/NICM, non-Hodgkin's lymphoma s/p chemoradiation, HTN, noncompliance with medical therapy, hx cocaine and alcohol abuse. Admitted with acute on chronic systolic CHF  Assessment/Plan   Acute on chronic systolic CHF/NICM: -Diagnosed 03/2016- Echo 04/07/16 LVEF 25-30%, Grade 2 DD, Peak PA pressure 47 mm Hg.  - R/LHC 04/15/16: Mild CAD, RA 16, PCWP mean 26 mmHg, Fick CO 4.55/CI 1.98 - Echo 10/19/16 LVEF 40-45%, Trivial AI - [facility] 12/22 [facility]: EF 30-35%, RV okay - Echo EF 30-35%, RV mildly reduced, RVSP 52 mmHg - Previously treated with Adriamycin for non-hodgkin's lymphoma. ? How much cocaine use contributing to cardiomyopathy, heavy use for many years. Denies recent alcohol, but had ED visits in 11/22 and 12/22 for alcohol abuse. Minimal PVCs on telemetry - Current admit with > 20 lb weight gain. Diuresed 23 pounds.  - RHC with preserved CO and near normal filling pressures.  - Volume status stable. Start lasix 40 mg po daily . Renal function stable.  - Continue  digoxin 0.125 mg daily. Dig level < 0.2 - Continue  spiro to 25 mg daily - Increase entresto 49-51 mg twice a day.  - Continue farxiga 10 mg daily  - Off beta blocker with history of cocaine use. Can eventually consider Carvedilol. - cMRI eventually to better assess etiology of CM - Sleep evaluation as outpatient -Needs to completely refrain from cocaine  use - CMRI today    2. Sinus tachycardia: - Likely d/t #1 - Continue digoxin as above   3. CAD: - Mild on LHC in 2017 - LHC this admit with min CAD. No chest pain.  - HS troponin 51 > 53, likely d/t demand ischemia - atorvastatin 40 - 81 mg aspirin    4. HTN: -  BP improved with GDMT    5. H/o Non-Hodgkins Lymphoma - h/o of rx with adriamycin. Likely contributing factor to his cardiomyopathy.  - Now in remission   6. Syncope -Reports multiple episodes recently. ? How much substance abuse contributing. Dizziness with position changes and voiding.  -Recurrent problem for several years on chart review. -Monitor for arrhythmias on tele - Resolved.    7. Possible pneumonia -right mid lower lung airspace disease on CXR -AF  -Procalcitonin < 0.10 - On augmentin   8. B/l lung abnormality on imaging -CTA chest 12/22: chronic collapse left upper lobe and focal obstruction left upper lobe bronchus.  Thick band soft tissue attenuation right upper lobe. Does not appear to be vasculature, may represent mass or collapsed lung parenchyma. Findings similar to prior CT 03/2020 -? If changes d/t prior radiation therapy   9. Hx iron deficiency anemia: -hgb trending down last few weeks, 11.9>10.4>10.6>9.8 now CBC today.  -Iron sats low. Received feraheme   10. Hypothyroidism: -TSH 12.3, Free T4 low -On synthroid -Per TRH   11. Hypokalemia: -K Stable today   12. Hx cocaine and ETOH abuse: -Complete cessation discussed -CSW consulted - interested in substance abuse program   13. Noncompliance with medical therapy: -Reports better compliance with medications after recent hospitalization in December   14. Dental caries/abscesses: -2 recent coarses of abx -Reports he needs teeth pulled but has not been completed. States he needed medical clearance by MD   SDOH: -CSW enrolling in [employer]. Does not have a license. -Working on applying for food stamps and disability -Has  Medicaid  Will need all meds at d/c. HF f/u next week. Instructed to bring all medications.     Length of Stay: 3  Amy Clegg, NP  09/04/2021, 7:03 AM  Advanced Heart Failure Team Pager [phone] (M-F; 7a - 5p)  Please contact [facility] for night-coverage after hours (5p -7a ) and weekends on [email or web address]  Patient seen with NP, agree with the above note.   Weight down another 5 lbs.  Feeling better.    General: NAD Neck: No JVD, no thyromegaly or thyroid nodule.  Lungs: Clear to auscultation bilaterally with normal respiratory effort. CV: Nondisplaced PMI.  Heart regular S1/S2, no S3/S4, 2/6 SEM RUSB.  No peripheral edema.   Abdomen: Soft, nontender, no hepatosplenomegaly, no distention.  Skin: Intact without lesions or rashes.  Neurologic: Alert and oriented x 3.  Psych: Normal affect. Extremities: No clubbing or cyanosis.  HEENT: Normal.   Patient looks euvolemic on exam.  Symptomatically improved.  Nonischemic cardiomyopathy, likely adriamycin vs substance abuse.  - Continue dapagliflozin 10 daily, Entresto 24/26 bid, spironolactone 25 daily.  - Add Coreg 3.125 mg bid (good cardiac output on RHC).  Will need to remain abstinent from cocaine, but less risk with this as alpha and beta blocker.  - Start torsemide 20 mg daily for home diuretic.   - Will get cardiac MRI today.   Think he can go home after cMRI.  Will need all meds at discharge.  Will arrange followup in CHF clinic.   Loralie Champagne 09/04/2021 9:09 AM

## 2021-09-04 NOTE — TOC Progression Note (Addendum)
Transition of Care [facility]) - Progression Note    Patient Details  Name: Leonard [name] MRN: [id number] Date of Birth: September 04, 1975  Transition of Care [facility]) CM/SW Contact  Jakera Beaupre, LCSW Phone Number: 09/04/2021, 2:06 PM  Clinical Narrative:    HF CSW spoke with Leonard [name] at bedside and provided him with a Medicaid application to fill out and bring to DSS and also gave him resources for substance use and therapy options. CSW suggested Leonard [name] [facility] as [facility] offer [facility] use therapy. Leonard [name] is enrolled with [employer] and is aware to call the CSW to arrange transportation for appointments.  HF CSW arranged PCP appointment/hospital follow up with [facility] at the soonest available appointment on Tuesday 10/14/21 at 3pm. This is on the patient's AVS.  CSW will continue to follow throughout discharge.     Barriers to Discharge: Continued Medical Work up  Expected Discharge Plan and Services   In-house Referral: Clinical Social Work Discharge Planning Services: CM Consult   Living arrangements for the past 2 months: Mobile Home                                       Social Determinants of Health (SDOH) Interventions Food Insecurity Interventions: Other (Comment) (Provide information for applying for Food Stamps at [place]) Financial Strain Interventions: Other (Comment) (Apply for disability with [place]) Housing Interventions: Intervention Not Indicated Transportation Interventions: [facility]  Readmission Risk Interventions No flowsheet data found.  Nyeshia Mysliwiec, MSW, [place] Heart Failure Social Worker

## 2021-09-04 NOTE — Progress Notes (Signed)
PROGRESS NOTE    Leonard [name]  XHB:[id number] DOB: Oct 17, 1975 DOA: 08/31/2021 PCP: Leonard Ginger, PA-C    Brief Narrative:  [name] was admitted to the hospital with the working diagnosis of acute on chronic systolic heart failure exacerbation.    46 yo male with the past medical history of systolic heart failure, substance abuse (cocaine) and non Hodgkin's lymphoma now on remission. Last chemotherapy in 2014. He presented with dyspnea and chest pain. Reported worsening dyspnea along with 15 lbs wight gain, positive chest pain. Not compliant with oral medications. Patient incarcerated. On his initial physical examination his blood pressure was 157/111, HR 122, RR 18, temp 97,5 and oxygen saturation 100% on supplemental 02. Heart with S1 and S2 present with no gallop or murmurs, lungs with no wheezing, abdomen soft, positive lower extremity edema.    Na 133, K 3,7, Cl 98, bicarb 27, BUN 19 and cr 1,0 BNP 1,530.6 High sensitive troponin 51 and 53. Wbc 5,6, hgb 10,6 and hct 35,7. Plt 308. SARS COVID-19 negative.   Chest radiograph with bilateral interstitial infiltrates predominantly at bases, with hilar vascular congestion. Personally reviewed.    EKG 121 bpm, right axis deviation, normal intervals, sinus rhythm, poor r wave progression, with no significant ST segment changes with negative T wave, in leads II, III, avf, V5 and V6.    Patient has been placed on aggressive diuresis with furosemide drip.    Assessment & Plan:   Principal Problem:   Acute on chronic combined systolic and diastolic CHF (congestive heart failure) (HCC) Active Problems:   TSH elevation   Cocaine abuse (HCC)   Anemia   COPD (chronic obstructive pulmonary disease) (HCC)   Sinus tachycardia   Elevated troponin     Acute on chronic systolic heart failure exacerbation.  Patient has responded to aggressive diuresis.  Urine out put over last 24 hr is 3,050 for a total negative fluid balance since  admission of 17,709 ml.  Cardiac catheterization with non obstructive CAD, 40% prox LAD, 30% mid Cx and 30% ostial LAD.  Mild venous pulmonary hypertension.   Plan to continue heart failure management with Entresto, digoxin and dapagliflozin  Continue carvedilol and diuresis with torsemide.  On spironolactone.  Will need close follow up as outpatient. Pending cardiac MRI to complete workup.    2, Hypothyroid.  On levothyroxine.    3. Neuropathy . On gabapentin    4. Obesity class 2. Calculated BMI is 35,6  Will need outpatient follow up.    5. NHL/ iron deficiency anemia. Lymphoma in remission  Patient had IV iron, follow up iron stores as outpatient.   6. Periodontal infection.  On Augmentin for antibiotic therapy, follow up with dental surgery as outpatient.  Pneumonia ruled out.    7. Substance abuse. Cocaine, no signs of intoxication or withdrawal.  Continue cessation counseling    8. COPD. No signs of COPD exacerbation   9. Hypokalemia and hyponatremia. Continue K correction with Kcl and follow up on Na in am, Patient is tolerating po well, furosemide IV now has been changed to oral torsemide.     Status is: Inpatient  Remains inpatient appropriate because: pending final work up with cardiac MRI     DVT prophylaxis: Enoxaparin   Code Status:    full  Family Communication:   No family at the bedside      Consultants:  Cardiology   Procedures:  Cath   Antimicrobials:  Augmentin     Subjective: Patient  with improvement in dyspnea, no nausea or vomiting, no chest pain. Edema lower extremities, has improved.   Objective: Vitals:   09/04/21 0030 09/04/21 0347 09/04/21 0745 09/04/21 0834  BP: 111/75 (!) 141/92 120/79   Pulse: 100 (!) 102 (!) 104   Resp: 20 20 15    Temp: 98.1 F (36.7 C) 97.6 F (36.4 C) 98 F (36.7 C)   TempSrc: Oral Oral Oral   SpO2: 97% 99% 95% 98%  Weight:  114.3 kg    Height:        Intake/Output Summary (Last 24 hours)  at 09/04/2021 1436 Last data filed at 09/04/2021 1324 Gross per 24 hour  Intake 720 ml  Output 1625 ml  Net -905 ml   Filed Weights   09/02/21 0446 09/03/21 0400 09/04/21 0347  Weight: 122.8 kg 116 kg 114.3 kg    Examination:   General: Not in pain or dyspnea,  Neurology: Awake and alert, non focal  E ENT: no pallor, no icterus, oral mucosa moist Cardiovascular: No JVD. S1-S2 present, rhythmic, no gallops, rubs, or murmurs. No lower extremity edema. Pulmonary: positive breath sounds bilaterally, adequate air movement, no wheezing, rhonchi or rales. Gastrointestinal. Abdomen soft and non tender Skin. No rashes Musculoskeletal: no joint deformities     Data Reviewed: I have personally reviewed following labs and imaging studies  CBC: Recent Labs  Lab 08/31/21 1921 09/01/21 0517 09/03/21 1003 09/03/21 1304 09/03/21 1305  WBC 5.6 7.4 5.0  --   --   NEUTROABS  --  6.1  --   --   --   HGB 10.6* 9.8* 12.5* 13.3 13.6  HCT 35.7* 33.1* 42.3 39.0 40.0  MCV 83.2 81.9 81.5  --   --   PLT 308 288 378  --   --    Basic Metabolic Panel: Recent Labs  Lab 08/31/21 1921 08/31/21 2222 09/01/21 0517 09/02/21 0508 09/03/21 0355 09/03/21 1003 09/03/21 1304 09/03/21 1305 09/04/21 0322  NA 133*  --  132* 136 138  --  141 140 132*  K 3.7  --  3.2* 3.6 3.4*  --  3.4* 3.5 4.0  CL 98  --  97* 97* 98  --   --   --  96*  CO2 27  --  26 28 31   --   --   --  27  GLUCOSE 118*  --  127* 146* 97  --   --   --  94  BUN 19  --  15 19 14   --   --   --  10  CREATININE 1.00  --  0.90 0.98 1.01  --   --   --  1.11  CALCIUM 8.2*  --  8.6* 8.6* 8.8*  --   --   --  9.2  MG  --  2.1 2.0  --   --  2.1  --   --  2.1  PHOS  --  3.1 2.9  --   --   --   --   --   --    GFR: Estimated Creatinine Clearance: 110.3 mL/min (by C-G formula based on SCr of 1.11 mg/dL). Liver Function Tests: Recent Labs  Lab 08/31/21 2222 09/01/21 0517  AST 35 31  ALT 31 31  ALKPHOS 102 94  BILITOT 0.4 0.7  PROT 7.5  7.2  ALBUMIN 3.8 3.7   No results for input(s): LIPASE, AMYLASE in the last 168 hours. No results for input(s): AMMONIA in the last 168  hours. Coagulation Profile: No results for input(s): INR, PROTIME in the last 168 hours. Cardiac Enzymes: Recent Labs  Lab 08/31/21 2222  CKTOTAL 185   BNP (last 3 results) No results for input(s): PROBNP in the last 8760 hours. HbA1C: No results for input(s): HGBA1C in the last 72 hours. CBG: No results for input(s): GLUCAP in the last 168 hours. Lipid Profile: Recent Labs    09/02/21 0508  CHOL 165  HDL 37*  LDLCALC 104*  TRIG 121  CHOLHDL 4.5   Thyroid Function Tests: No results for input(s): TSH, T4TOTAL, FREET4, T3FREE, THYROIDAB in the last 72 hours. Anemia Panel: No results for input(s): VITAMINB12, FOLATE, FERRITIN, TIBC, IRON, RETICCTPCT in the last 72 hours.    Radiology Studies: I have reviewed all of the imaging during this hospital visit personally     Scheduled Meds:  amoxicillin-clavulanate  1 tablet Oral Q12H   aspirin  81 mg Oral Daily   atorvastatin  40 mg Oral Daily   carvedilol  3.125 mg Oral BID WC   dapagliflozin propanediol  10 mg Oral Daily   digoxin  0.125 mg Oral Daily   enoxaparin (LOVENOX) injection  60 mg Subcutaneous Q24H   gabapentin  200 mg Oral TID   levothyroxine  75 mcg Oral Q0600   mometasone-formoterol  2 puff Inhalation BID   pantoprazole  40 mg Oral Daily   sacubitril-valsartan  1 tablet Oral BID   sodium chloride flush  3 mL Intravenous Q12H   sodium chloride flush  3 mL Intravenous Q12H   spironolactone  25 mg Oral Daily   torsemide  20 mg Oral Daily   umeclidinium bromide  1 puff Inhalation Daily   Continuous Infusions:  sodium chloride       LOS: 3 days        Shahzaib Azevedo Gerome Apley, MD

## 2021-09-04 NOTE — Progress Notes (Signed)
CARDIAC REHAB PHASE I   Discussed with pt HF booklet including daily wts, signs of HF, and low sodium diet. Pt receptive. He sts he and his wife have been discussing diet changes. Encouraged walking as tolerated. [place], ACSM 09/04/2021 9:49 AM

## 2021-09-04 NOTE — Progress Notes (Signed)
Patient probation officer okay to remove the  arrest ankle bracelet to do MRI was told to give the arrest ankle bracelet to the patient to keep. Arrest ankle bracelet put in the bag and given to the patient per probation officer.

## 2021-09-05 ENCOUNTER — Other Ambulatory Visit (HOSPITAL_COMMUNITY): Payer: Self-pay

## 2021-09-05 ENCOUNTER — Telehealth (HOSPITAL_COMMUNITY): Payer: Self-pay | Admitting: Licensed Clinical Social Worker

## 2021-09-05 DIAGNOSIS — I5043 Acute on chronic combined systolic (congestive) and diastolic (congestive) heart failure: Secondary | ICD-10-CM | POA: Diagnosis not present

## 2021-09-05 LAB — BASIC METABOLIC PANEL
Anion gap: 9 (ref 5–15)
BUN: 15 mg/dL (ref 6–20)
CO2: 24 mmol/L (ref 22–32)
Calcium: 9.4 mg/dL (ref 8.9–10.3)
Chloride: 102 mmol/L (ref 98–111)
Creatinine, Ser: 0.95 mg/dL (ref 0.61–1.24)
GFR, Estimated: >60 mL/min (ref >60)
Glucose, Bld: 103 mg/dL — ABNORMAL HIGH (ref 70–99)
Potassium: 4 mmol/L (ref 3.5–5.1)
Sodium: 135 mmol/L (ref 135–145)

## 2021-09-05 LAB — DIGOXIN LEVEL: Digoxin Level: 0.3 ng/mL — ABNORMAL LOW (ref 0.8–2.0)

## 2021-09-05 MED ORDER — ACETAMINOPHEN 325 MG PO TABS
650.0000 mg | ORAL_TABLET | ORAL | Status: AC | PRN
Start: 2021-09-05 — End: ?

## 2021-09-05 MED ORDER — GABAPENTIN 100 MG PO CAPS
200.0000 mg | ORAL_CAPSULE | Freq: Three times a day (TID) | ORAL | 0 refills | Status: DC
  Filled 2021-09-05: qty 180, 30d supply, fill #0

## 2021-09-05 MED ORDER — SACUBITRIL-VALSARTAN 49-51 MG PO TABS
1.0000 | ORAL_TABLET | Freq: Two times a day (BID) | ORAL | 0 refills | Status: AC
  Filled 2021-09-05: qty 60, 30d supply, fill #0

## 2021-09-05 MED ORDER — SPIRONOLACTONE 25 MG PO TABS
25.0000 mg | ORAL_TABLET | Freq: Every day | ORAL | 0 refills | Status: AC
  Filled 2021-09-05: qty 30, 30d supply, fill #0

## 2021-09-05 MED ORDER — CARVEDILOL 3.125 MG PO TABS
3.1250 mg | ORAL_TABLET | Freq: Two times a day (BID) | ORAL | 0 refills | Status: AC
  Filled 2021-09-05: qty 60, 30d supply, fill #0

## 2021-09-05 MED ORDER — ALBUTEROL SULFATE (2.5 MG/3ML) 0.083% IN NEBU
2.5000 mg | INHALATION_SOLUTION | RESPIRATORY_TRACT | 0 refills | Status: AC | PRN
  Filled 2021-09-05: qty 90, 3d supply, fill #0

## 2021-09-05 MED ORDER — POTASSIUM CHLORIDE CRYS ER 10 MEQ PO TBCR
10.0000 meq | EXTENDED_RELEASE_TABLET | Freq: Every day | ORAL | 0 refills | Status: DC
  Filled 2021-09-05: qty 30, 30d supply, fill #0

## 2021-09-05 MED ORDER — GABAPENTIN 300 MG PO CAPS: 900.0000 mg | ORAL_CAPSULE | Freq: Three times a day (TID) | ORAL | Status: AC

## 2021-09-05 MED ORDER — ATORVASTATIN CALCIUM 40 MG PO TABS
40.0000 mg | ORAL_TABLET | Freq: Every day | ORAL | 0 refills | Status: AC
  Filled 2021-09-05: qty 30, 30d supply, fill #0

## 2021-09-05 MED ORDER — TORSEMIDE 20 MG PO TABS
20.0000 mg | ORAL_TABLET | Freq: Every day | ORAL | 0 refills | Status: DC
  Filled 2021-09-05: qty 30, 30d supply, fill #0

## 2021-09-05 MED ORDER — HYDROCODONE-ACETAMINOPHEN 5-325 MG PO TABS: 1.0000 | ORAL_TABLET | Freq: Three times a day (TID) | ORAL | Status: DC | PRN

## 2021-09-05 MED ORDER — HYDROCODONE-ACETAMINOPHEN 5-325 MG PO TABS
1.0000 | ORAL_TABLET | Freq: Three times a day (TID) | ORAL | 0 refills | Status: AC | PRN
  Filled 2021-09-05: qty 10, 4d supply, fill #0

## 2021-09-05 MED ORDER — DAPAGLIFLOZIN PROPANEDIOL 10 MG PO TABS
10.0000 mg | ORAL_TABLET | Freq: Every day | ORAL | 0 refills | Status: AC
  Filled 2021-09-05: qty 30, 30d supply, fill #0

## 2021-09-05 NOTE — TOC Initial Note (Addendum)
Transition of Care [facility]) - Initial/Assessment Note    Patient Details  Name: Leonard [name] MRN: [id number] Date of Birth: 22-Aug-1976  Transition of Care [facility]) CM/SW Contact:    Erenest Rasher, RN Phone Number: [phone] 09/05/2021, 10:11 AM  Clinical Narrative:                  HF TOC CM spoke to pt and states his wife works and unable to provide transportation to appts. Pt was arranged with [employer]. Waiver signed and provided pt with copy. Will need ride home today. TOC CM notified HF CSW of assistance need with transportation to follow up appts. Pt may benefit from Paramedicine. Message sent to attending.   Pt will be followed by Heart Failure [facility]. Pt reports need letter for clearance for root canal. Letter was provided.   HF CSW has arranged transportation to his follow up appts.     Expected Discharge Plan: Home/Self Care Barriers to Discharge: No Barriers Identified   Patient Goals and CMS Choice Patient states their goals for this hospitalization and ongoing recovery are:: to return home      Expected Discharge Plan and Services Expected Discharge Plan: Home/Self Care In-house Referral: Clinical Social Work Discharge Planning Services: CM Consult   Living arrangements for the past 2 months: Mobile Home Expected Discharge Date: 09/05/21                 Prior Living Arrangements/Services Living arrangements for the past 2 months: Mobile Home Lives with:: Self, Spouse Patient language and need for interpreter reviewed:: Yes Do you feel safe going back to the place where you live?: Yes      Need for Family Participation in Patient Care: No (Comment) Care giver support system in place?: No (comment)   Criminal Activity/Legal Involvement Pertinent to Current Situation/Hospitalization: No - Comment as needed (maybe unsure)  Activities of Daily Living Home Assistive Devices/Equipment: None ADL Screening (condition at time of  admission) Patient's cognitive ability adequate to safely complete daily activities?: Yes Is the patient deaf or have difficulty hearing?: No Does the patient have difficulty seeing, even when wearing glasses/contacts?: No Does the patient have difficulty concentrating, remembering, or making decisions?: No Patient able to express need for assistance with ADLs?: Yes Does the patient have difficulty dressing or bathing?: No Independently performs ADLs?: Yes (appropriate for developmental age) Does the patient have difficulty walking or climbing stairs?: No Weakness of Legs: None Weakness of Arms/Hands: None  Permission Sought/Granted Permission sought to share information with : Family Supports Permission granted to share information with : Yes, Verbal Permission Granted  Share Information with NAME: Quentin Cornwall     Permission granted to share info w Relationship: Spouse  Permission granted to share info w Contact Information: [phone]  Emotional Assessment Appearance:: Appears older than stated age Attitude/Demeanor/Rapport: Engaged Affect (typically observed): Accepting Orientation: : Oriented to Self, Oriented to Place, Oriented to  Time, Oriented to Situation Alcohol / Substance Use: Illicit Drugs Psych Involvement: No (comment)  Admission diagnosis:  CHF exacerbation ([place]) [I50.9] Acute on chronic congestive heart failure, unspecified heart failure type [facility]) [I50.9] Patient Active Problem List   Diagnosis Date Noted   Sinus tachycardia 09/01/2021   Elevated troponin 09/01/2021   CHF exacerbation ([place]) 08/31/2021   Anemia 08/31/2021   COPD (chronic obstructive pulmonary disease) (H[facility])    Acute on chronic HFrEF (heart failure with reduced ejection fraction) ([place]) 09/03/2017   Cocaine abuse ([place]) 09/03/2017   Lightheaded  01/22/2017   AKI (acute kidney injury) ([place]) 11/09/2016   Abdominal wall abscess at site of surgical wound    Fever    Cellulitis 10/22/2016    Postoperative ileus ([place]) 10/21/2016   Incarcerated hernia 10/18/2016   Right lower lobe lung mass 10/18/2016   Acute on chronic congestive heart failure ([place])    Incarcerated umbilical hernia    Asthma 10/06/2016   History of substance abuse ([place]) 10/06/2016   TSH elevation 10/06/2016   Bronchitis 10/06/2016   Chronic pain syndrome 10/06/2016   History of non-Hodgkin's lymphoma 04/30/2016   Snoring 04/30/2016   Acute on chronic combined systolic and diastolic CHF (congestive heart failure) ([place]) 04/05/2016   Aspiration pneumonia ([place]) 10/09/2015   Essential hypertension 10/09/2015   PCP:  Secundino Ginger, PA-C Pharmacy:   [facility] [address] [place]  [address] Phone: [phone] Fax: [phone]  [name] [address] [place] [address] Phone: [phone] Fax: [phone]     Social Determinants of Health (SDOH) Interventions Food Insecurity Interventions: Other (Comment) (Provide information for applying for Food Stamps at [place]) Financial Strain Interventions: Other (Comment) (Apply for disability with [place]) Housing Interventions: Intervention Not Indicated Transportation Interventions: [facility]  Readmission Risk Interventions No flowsheet data found.

## 2021-09-05 NOTE — TOC CM/SW Note (Signed)
HF TOC CM arranged ride to with [facility] [employer] to home. Waiver scanned to transportation.   [place], Heart Failure TOC CM [phone]

## 2021-09-05 NOTE — Progress Notes (Signed)
Patient given discharge instructions and stated understanding. 

## 2021-09-05 NOTE — Telephone Encounter (Signed)
CSW arranged transport for HF Clinic appointment on 09-11-21. Patient made aware of pick up time. Raquel Sarna, [place], [place]

## 2021-09-05 NOTE — Plan of Care (Signed)
°  Problem: Education: Goal: Ability to demonstrate management of disease process will improve Outcome: Adequate for Discharge Goal: Ability to verbalize understanding of medication therapies will improve Outcome: Adequate for Discharge Goal: Individualized Educational Video(s) Outcome: Adequate for Discharge   Problem: Activity: Goal: Capacity to carry out activities will improve Outcome: Adequate for Discharge   Problem: Cardiac: Goal: Ability to achieve and maintain adequate cardiopulmonary perfusion will improve Outcome: Adequate for Discharge   Problem: Education: Goal: Understanding of CV disease, CV risk reduction, and recovery process will improve Outcome: Adequate for Discharge Goal: Individualized Educational Video(s) Outcome: Adequate for Discharge   Problem: Activity: Goal: Ability to return to baseline activity level will improve Outcome: Adequate for Discharge   Problem: Cardiovascular: Goal: Ability to achieve and maintain adequate cardiovascular perfusion will improve Outcome: Adequate for Discharge Goal: Vascular access site(s) Level 0-1 will be maintained Outcome: Adequate for Discharge   Problem: Health Behavior/Discharge Planning: Goal: Ability to safely manage health-related needs after discharge will improve Outcome: Adequate for Discharge

## 2021-09-05 NOTE — Discharge Summary (Addendum)
Physician Discharge Summary  Leonard [name] VEL:[id number] DOB: 18-Jun-1976 DOA: 08/31/2021  PCP: Secundino Ginger, PA-C  Admit date: 08/31/2021 Discharge date: 09/05/2021  Admitted From: Home  Disposition:   Home   Recommendations for Outpatient Follow-up and new medication changes:  Follow up with Durene Fruits NP in 7 to 10 days.  Follow up with cardiology heart failure as scheduled.  Follow up renal function in 7 days, follow up on electrolytes Patient has been placed on spironolactone and torsemide, dose of entresto has been increased. Started on carvedilol for B blockade and atorvastatin for statin therapy.  Started on dapagliflozin   Home Health: na   Equipment/Devices: na    Discharge Condition: stable  CODE STATUS: full  Diet recommendation:  heart healthy   Brief/Interim Summary: Leonard [name] was admitted to the hospital with the working diagnosis of acute on chronic systolic heart failure exacerbation.    46 yo male with the past medical history of systolic heart failure, substance abuse (cocaine) and non Hodgkin's lymphoma now on remission. Last chemotherapy in 2014. He presented with dyspnea and chest pain. Reported worsening dyspnea along with 15 lbs wight gain, positive chest pain. Not compliant with oral medications at home. On his initial physical examination his blood pressure was 157/111, HR 122, RR 18, temp 97,5 and oxygen saturation 100% on supplemental 02. Heart with S1 and S2 present with no gallop or murmurs, lungs with no wheezing, abdomen soft, positive lower extremity edema.    Na 133, K 3,7, Cl 98, bicarb 27, BUN 19 and cr 1,0 BNP 1,530.6 High sensitive troponin 51 and 53. Wbc 5,6, hgb 10,6 and hct 35,7. Plt 308. SARS COVID-19 negative.   Chest radiograph with bilateral interstitial infiltrates predominantly at bases, with hilar vascular congestion. Personally reviewed.    EKG 121 bpm, right axis deviation, normal intervals, sinus rhythm, poor r wave  progression, with no significant ST segment changes with negative T wave, in leads II, III, avf, V5 and V6.    Patient has been placed on aggressive diuresis with furosemide drip with improvement of his symptoms. Further work up with cardiac catheterization, with non ischemic cardiomyopathy. Patient had a cardiac MRI with no late gadolinium enhancement.   Patient was placed on guideline directed therapy and will follow up as outpatient.   Acute on chronic systolic heart failure exacerbation, non ischemic cardiomyopathy.  Patient was admitted to the cardiac telemetry unit and was placed on aggressive diuresis with furosemide drip. Negative fluid balance was achieved, 20,821 ML with significant improvement in his symptoms.  Echocardiogram with LV EF 30 to 35%, entire anterior and entire septum are akinetic, decreased RV systolic function, moderate pulmonary systolic pressure elevation 51.7 mmHg. Mild to moderate tricuspid valve regurgitation.   Further workup with cardiac catheterization right and left showed no obstructive CAD, prox LAD lesion 40% stenosed, mid Cx 30% stenosed and ost LAD 30% stenosed. Mild pulmonary venous hypertension. Cardiac MRI with mild dilated left ventricle with normal wall thickness, diffuse hypokinesis with EF 22%, mild aortic insufficiency and on delayed enhancement imaging no late gadolinium enhancement.   Continue heart failure management with carvedilol, dapagliflozin, digoxin, entresto, spironolactone and torsemide.   2. Hypothyroid. Continue with levothyroxine   3. Neuropathy. Continue with gabapentin.   4. Obesity class 2. Calculated BMI is 35,6  Continue outpatient follow up.   5. NHL with iron deficiency anemia. Lymphoma in remission.  Iron panel with serum iron 33, TIBC 599, transferrin saturation 6, ferritin is 37. Consistent with  anemia of chronic disease with iron deficiency.   Patient received IV iron during his hospitalization, plan to follow up  iron panel as outpatient.   6. Periodontal infection. Patient was continued on Augmentin for antibiotic therapy and plan to follow up as outpatient.   7. Substance abuse. History of cocaine use. No signs of intoxication or withdrawal. Cessation substance abuse counseling.   8. COPD. No clinical signs of exacerbation.   9. Hypokalemia and hyponatremia. Electrolyte abnormalities due to aggressive diuresis with loop diuretics. Electrolytes were corrected. At the time of his discharge his renal function has a serum cr of 0.95, K is 4,0 and serum bicarbonate at 24 with Na 135 Continue diuresis with torsemide and spironolactone Follow up renal function as outpatient.   Discharge Diagnoses:  Principal Problem:   Acute on chronic combined systolic and diastolic CHF (congestive heart failure) (HCC) Active Problems:   TSH elevation   Cocaine abuse (HCC)   Anemia   COPD (chronic obstructive pulmonary disease) (HCC)   Sinus tachycardia   Elevated troponin    Discharge Instructions   Allergies as of 09/05/2021   No Known Allergies      Medication List     STOP taking these medications    amoxicillin-clavulanate 875-125 MG tablet Commonly known as: AUGMENTIN   bumetanide 1 MG tablet Commonly known as: BUMEX   furosemide 40 MG tablet Commonly known as: LASIX   ibuprofen 200 MG tablet Commonly known as: ADVIL   naproxen 500 MG tablet Commonly known as: NAPROSYN   omeprazole 20 MG capsule Commonly known as: PRILOSEC   predniSONE 10 MG tablet Commonly known as: DELTASONE       TAKE these medications    acetaminophen 325 MG tablet Commonly known as: TYLENOL Take 2 tablets (650 mg total) by mouth every 4 (four) hours as needed for headache or mild pain.   aspirin 81 MG EC tablet Take 81 mg by mouth daily.   atorvastatin 40 MG tablet Commonly known as: LIPITOR Take 1 tablet (40 mg total) by mouth daily.   carvedilol 3.125 MG tablet Commonly known as:  COREG Take 1 tablet (3.125 mg total) by mouth 2 (two) times daily with a meal.   cyclobenzaprine 10 MG tablet Commonly known as: FLEXERIL Take 10 mg by mouth 3 (three) times daily as needed.   digoxin 0.125 MG tablet Commonly known as: LANOXIN Take 0.25 mg by mouth daily.   Entresto 49-51 MG Generic drug: sacubitril-valsartan Take 1 tablet by mouth 2 (two) times daily. What changed: when to take this   Farxiga 10 MG Tabs tablet Generic drug: dapagliflozin propanediol Take 1 tablet (10 mg total) by mouth daily.   fluticasone-salmeterol 500-50 MCG/ACT Aepb Commonly known as: ADVAIR Inhale 1 puff into the lungs in the morning and at bedtime.   gabapentin 300 MG capsule Commonly known as: NEURONTIN Take 3 capsules (900 mg total) by mouth 3 (three) times daily.   HYDROcodone-acetaminophen 5-325 MG tablet Commonly known as: NORCO/VICODIN Take 1 tablet by mouth every 8 (eight) hours as needed for severe pain.   levothyroxine 75 MCG tablet Commonly known as: SYNTHROID Take 75 mcg by mouth daily.   pantoprazole 40 MG tablet Commonly known as: PROTONIX Take 40 mg by mouth daily.   potassium chloride 10 MEQ tablet Commonly known as: KLOR-CON M Take 1 tablet (10 mEq total) by mouth daily. What changed:  medication strength how much to take   ProAir HFA 108 (90 Base) MCG/ACT inhaler Generic drug:  albuterol Inhale 2 puffs into the lungs every 4 (four) hours as needed. What changed: Another medication with the same name was added. Make sure you understand how and when to take each.   albuterol (2.5 MG/3ML) 0.083% nebulizer solution Commonly known as: PROVENTIL Take 3 mLs (2.5 mg total) by nebulization every 2 (two) hours as needed for wheezing. What changed: You were already taking a medication with the same name, and this prescription was added. Make sure you understand how and when to take each.   spironolactone 25 MG tablet Commonly known as: ALDACTONE Take 1 tablet (25  mg total) by mouth daily.   tiotropium 18 MCG inhalation capsule Commonly known as: SPIRIVA Place 18 mcg into inhaler and inhale daily.   torsemide 20 MG tablet Commonly known as: DEMADEX Take 1 tablet (20 mg total) by mouth daily.   triamcinolone cream 0.1 % Commonly known as: KENALOG Apply 1 application topically 2 (two) times daily.               Durable Medical Equipment  (From admission, onward)           Start     Ordered   09/05/21 0000  For home use only DME Nebulizer machine       Question Answer Comment  Patient needs a nebulizer to treat with the following condition COPD (chronic obstructive pulmonary disease) ([place])   Length of Need 6 Months      09/05/21 1011            Follow-up Information     [facility] Follow up on 09/11/2021.   Specialty: Cardiology Why: at 2:30 Contact information: [address] [id number] [place], [place], NP. Go in 39 day(s).   Specialty: Nurse Practitioner Why: Hospital Follow up for Tuesday 10/14/21 at 3:00pm with Durene Fruits, NP call if need transportation Contact information: [place] [address] [phone]                No Known Allergies  Consultations: Cardiology    Procedures/Studies: DG Chest 2 View  Result Date: 08/31/2021 CLINICAL DATA:  Chest pain.  Shortness of breath. EXAM: CHEST - 2 VIEW COMPARISON:  08/20/2021, priors reviewed.  Chest CT 01/14/2018 FINDINGS: Upper normal heart size. Stable mediastinal contours with left suprahilar scarring. Patchy airspace disease in the right mid lower lung zone, progressed from prior exam. Left lung base opacities are improved. Small right pleural effusion with fluid in the fissures. No pneumothorax. No convincing pulmonary edema. Stable osseous structures. IMPRESSION: Patchy airspace disease in the right mid lower  lung zone is new from prior exam, suspicious for pneumonia. Small right pleural effusion. Electronically Signed   By: Keith Rake M.D.   On: 08/31/2021 19:44   DG Chest 2 View  Result Date: 08/20/2021 CLINICAL DATA:  Shortness of breath EXAM: CHEST - 2 VIEW COMPARISON:  08/16/2021, CT 01/14/2018, 11/05/2017, 07/28/2017 FINDINGS: Chronic upper lobe collapse with hyperlucent upper lung zones as seen on multiple previous exams. Patchy airspace disease at the lingula and bases. No pleural effusion. Stable cardiac size. IMPRESSION: 1. Chronic upper lobe collapse. 2. Patchy opacities at the lingula and left greater than right lung base could be due to respiratory infection. Electronically Signed   By: Donavan Foil M.D.   On: 08/20/2021 23:18   DG Chest 2 View  Result Date: 08/16/2021  CLINICAL DATA:  Shortness of breath and leg swelling x2 days. EXAM: CHEST - 2 VIEW COMPARISON:  July 15, 2021 FINDINGS: The heart size and mediastinal contours are within normal limits. Chronic left upper lobe collapse. Low lung volumes with bibasilar atelectasis. No visible pleural effusion or pneumothorax. The visualized skeletal structures are unremarkable. IMPRESSION: Chronic left upper lobe collapse.  No acute cardiopulmonary disease. Electronically Signed   By: Dahlia Bailiff M.D.   On: 08/16/2021 18:44   CARDIAC CATHETERIZATION  Result Date: 09/03/2021   Prox LAD lesion is 40% stenosed.   Mid Cx lesion is 30% stenosed.   Ost LAD lesion is 30% stenosed. 1. Mild nonobstructive CAD, nonischemic cardiomyopathy. 2. Filling pressures near normal. 3. Mild pulmonary venous hypertension. 4. Preserved cardiac output.   MR CARDIAC MORPHOLOGY W WO CONTRAST  Result Date: 09/04/2021 CLINICAL DATA:  Cardiomyopathy of uncertain etiology EXAM: CARDIAC MRI TECHNIQUE: The patient was scanned on a 1.5 Tesla GE magnet. A dedicated cardiac coil was used. Functional imaging was done using Fiesta sequences. 2,3, and 4 chamber views  were done to assess for RWMA's. Modified Simpson's rule using a short axis stack was used to calculate an ejection fraction on a dedicated work Conservation officer, nature. The patient received 15 cc of Gadavist. After 10 minutes inversion recovery sequences were used to assess for infiltration and scar tissue. CONTRAST:  Gadavist 15 cc FINDINGS: There appears to be scarring at the apex of the left lung. Mildly dilated left ventricle with normal wall thickness. Diffuse severe hypokinesis with EF 22%. Normal right ventricular size with EF 27%. Mild left atrial enlargement. Normal right atrium. No significant mitral regurgitation noted. The aortic valve appears thickened with restriction. It is trileaflet. There is mild aortic insufficiency. On delayed enhancement imaging, there is no late gadolinium enhancement (LGE). MEASUREMENTS: MEASUREMENTS LVEDV 234 mL LVSV 51 mL LVEF 22% RVEDV 214 mL RVSV 57 mL RVEF 27% IMPRESSION: 1.  Mildly dilated LV with EF 22%, diffuse hypokinesis. 2.  Normal RV size with EF 27%. 3. No myocardial LGE, so no definitive evidence for prior MI, myocarditis, or infiltrative disease. 4. Abnormal aortic valve is thickened with at least mild stenosis and mild regurgitation. Dalton Mclean Electronically Signed   By: Loralie Champagne M.D.   On: 09/04/2021 20:22   ECHOCARDIOGRAM COMPLETE  Result Date: 09/01/2021    ECHOCARDIOGRAM REPORT   Patient Name:   Leonard [name] Date of Exam: 09/01/2021 Medical Rec #:  [id number]      Height:       71.0 in Accession #:    [id number]     Weight:       274.0 lb Date of Birth:  1976-03-09       BSA:          2.410 m Patient Age:    79 years       BP:           146/110 mmHg Patient Gender: M              HR:           107 bpm. Exam Location:  Inpatient Procedure: 2D Echo Indications:    Cardiomyopathy  History:        Patient has prior history of Echocardiogram examinations, most                 recent 10/19/2016. CHF, COPD; Risk Factors:cocaine abuse.  Sonographer:     [place] Referring Phys: Geryl Rankins  [place]OUT[place] IMPRESSIONS  1. Left ventricular ejection fraction, by estimation, is 30 to 35%. The left ventricle has moderately decreased function. The left ventricle demonstrates regional wall motion abnormalities (see scoring diagram/findings for description). There is mild left ventricular hypertrophy. Left ventricular diastolic parameters are indeterminate.  2. Right ventricular systolic function is mildly reduced. The right ventricular size is normal. There is moderately elevated pulmonary artery systolic pressure. The estimated right ventricular systolic pressure is 75.1 mmHg.  3. The mitral valve is normal in structure. Trivial mitral valve regurgitation.  4. Tricuspid valve regurgitation is mild to moderate.  5. The aortic valve is calcified. There is moderate calcification of the aortic valve. Aortic valve regurgitation is mild. Mild aortic valve stenosis.  6. Aortic dilatation noted. There is dilatation of the aortic root, measuring 40 mm.  7. The inferior vena cava is dilated in size with <50% respiratory variability, suggesting right atrial pressure of 15 mmHg. FINDINGS  Left Ventricle: Left ventricular ejection fraction, by estimation, is 30 to 35%. The left ventricle has moderately decreased function. The left ventricle demonstrates regional wall motion abnormalities. The left ventricular internal cavity size was normal in size. There is mild left ventricular hypertrophy. Left ventricular diastolic parameters are indeterminate.  LV Wall Scoring: The entire anterior wall and entire septum are akinetic. The apex is hypokinetic. The entire lateral wall and entire inferior wall are normal. Right Ventricle: The right ventricular size is normal. Right vetricular wall thickness was not well visualized. Right ventricular systolic function is mildly reduced. There is moderately elevated pulmonary artery systolic pressure. The tricuspid regurgitant velocity is  3.03 m/s, and with an assumed right atrial pressure of 15 mmHg, the estimated right ventricular systolic pressure is 02.5 mmHg. Left Atrium: Left atrial size was normal in size. Right Atrium: Right atrial size was normal in size. Pericardium: Trivial pericardial effusion is present. Mitral Valve: The mitral valve is normal in structure. Trivial mitral valve regurgitation. Tricuspid Valve: The tricuspid valve is normal in structure. Tricuspid valve regurgitation is mild to moderate. Aortic Valve: The aortic valve is calcified. There is moderate calcification of the aortic valve. Aortic valve regurgitation is mild. Mild aortic stenosis is present. Aortic valve mean gradient measures 13.0 mmHg. Aortic valve peak gradient measures 22.7  mmHg. Aortic valve area, by VTI measures 3.36 cm. Pulmonic Valve: The pulmonic valve was grossly normal. Pulmonic valve regurgitation is not visualized. Aorta: Aortic dilatation noted. There is dilatation of the aortic root, measuring 40 mm. Venous: The inferior vena cava is dilated in size with less than 50% respiratory variability, suggesting right atrial pressure of 15 mmHg. IAS/Shunts: The interatrial septum was not well visualized.  LEFT VENTRICLE PLAX 2D LVIDd:         5.60 cm      Diastology LVIDs:         4.70 cm      LV e' medial: 4.35 cm/s LV PW:         1.20 cm LV IVS:        1.00 cm LVOT diam:     2.60 cm LV SV:         122 LV SV Index:   51 LVOT Area:     5.31 cm  LV Volumes (MOD) LV vol d, MOD A4C: 133.0 ml LV vol s, MOD A4C: 96.3 ml LV SV MOD A4C:     133.0 ml RIGHT VENTRICLE            IVC RV S prime:  9.25 cm/s  IVC diam: 2.60 cm TAPSE (M-mode): 1.4 cm LEFT ATRIUM             Index        RIGHT ATRIUM           Index LA diam:        3.90 cm 1.62 cm/m   RA Area:     18.00 cm LA Vol (A2C):   49.0 ml 20.33 ml/m  RA Volume:   51.10 ml  21.20 ml/m LA Vol (A4C):   49.9 ml 20.70 ml/m LA Biplane Vol: 49.7 ml 20.62 ml/m  AORTIC VALVE AV Area (Vmax):    3.26 cm AV Area  (Vmean):   3.10 cm AV Area (VTI):     3.36 cm AV Vmax:           238.00 cm/s AV Vmean:          164.000 cm/s AV VTI:            0.363 m AV Peak Grad:      22.7 mmHg AV Mean Grad:      13.0 mmHg LVOT Vmax:         146.00 cm/s LVOT Vmean:        95.900 cm/s LVOT VTI:          0.230 m LVOT/AV VTI ratio: 0.63  AORTA Ao Root diam: 4.00 cm Ao Asc diam:  3.30 cm TRICUSPID VALVE TR Peak grad:   36.7 mmHg TR Vmax:        303.00 cm/s  SHUNTS Systemic VTI:  0.23 m Systemic Diam: 2.60 cm Oswaldo Milian MD Electronically signed by Oswaldo Milian MD Signature Date/Time: 09/01/2021/10:54:01 AM    Final      Procedures: cardiac catheterization right and left.   Subjective: Patient is feeling better, his dyspnea and lower extremity edema, have improved, no nausea or vomiting, no chest pain.   Discharge Exam: Vitals:   09/05/21 0512 09/05/21 0817  BP: 114/79   Pulse: 96   Resp: 18   Temp: 97.9 F (36.6 C)   SpO2: 92% 94%   Vitals:   09/04/21 1935 09/04/21 1952 09/05/21 0512 09/05/21 0817  BP: 114/83  114/79   Pulse: (!) 103  96   Resp: 18  18   Temp: 98.1 F (36.7 C)  97.9 F (36.6 C)   TempSrc: Oral  Oral   SpO2: 97% 98% 92% 94%  Weight:   114.3 kg   Height:        General: Not in pain or dyspnea,  Neurology: Awake and alert, non focal  E ENT: no pallor, no icterus, oral mucosa moist Cardiovascular: No JVD. S1-S2 present, rhythmic, no gallops, rubs, or murmurs. No lower extremity edema. Pulmonary: positive breath sounds bilaterally, no rales or wheezing Gastrointestinal. Abdomen soft and on tender Skin. No rashes Musculoskeletal: no joint deformities   The results of significant diagnostics from this hospitalization (including imaging, microbiology, ancillary and laboratory) are listed below for reference.     Microbiology: Recent Results (from the past 240 hour(s))  Resp Panel by RT-PCR (Flu A&B, Covid) Nasopharyngeal Swab     Status: None   Collection Time: 08/31/21  5:51  AM   Specimen: Nasopharyngeal Swab; Nasopharyngeal(NP) swabs in vial transport medium  Result Value Ref Range Status   SARS Coronavirus 2 by RT PCR NEGATIVE NEGATIVE Final    Comment: (NOTE) SARS-CoV-2 target nucleic acids are NOT DETECTED.  The SARS-CoV-2 RNA is generally detectable in  upper respiratory specimens during the acute phase of infection. The lowest concentration of SARS-CoV-2 viral copies this assay can detect is 138 copies/mL. A negative result does not preclude SARS-Cov-2 infection and should not be used as the sole basis for treatment or other patient management decisions. A negative result may occur with  improper specimen collection/handling, submission of specimen other than nasopharyngeal swab, presence of viral mutation(s) within the areas targeted by this assay, and inadequate number of viral copies(<138 copies/mL). A negative result must be combined with clinical observations, patient history, and epidemiological information. The expected result is Negative.  Fact Sheet for Patients:  EntrepreneurPulse.com.au  Fact Sheet for Healthcare Providers:  IncredibleEmployment.be  This test is no t yet approved or cleared by the [place] FDA and  has been authorized for detection and/or diagnosis of SARS-CoV-2 by FDA under an Emergency Use Authorization (EUA). This EUA will remain  in effect (meaning this test can be used) for the duration of the COVID-19 declaration under Section 564(b)(1) of the Act, 21 U.S.C.section 360bbb-3(b)(1), unless the authorization is terminated  or revoked sooner.       Influenza A by PCR NEGATIVE NEGATIVE Final   Influenza B by PCR NEGATIVE NEGATIVE Final    Comment: (NOTE) The Xpert Xpress SARS-CoV-2/FLU/RSV plus assay is intended as an aid in the diagnosis of influenza from Nasopharyngeal swab specimens and should not be used as a sole basis for treatment. Nasal washings and aspirates are  unacceptable for Xpert Xpress SARS-CoV-2/FLU/RSV testing.  Fact Sheet for Patients: EntrepreneurPulse.com.au  Fact Sheet for Healthcare Providers: IncredibleEmployment.be  This test is not yet approved or cleared by the [place] FDA and has been authorized for detection and/or diagnosis of SARS-CoV-2 by FDA under an Emergency Use Authorization (EUA). This EUA will remain in effect (meaning this test can be used) for the duration of the COVID-19 declaration under Section 564(b)(1) of the Act, 21 U.S.C. section 360bbb-3(b)(1), unless the authorization is terminated or revoked.  Performed at [facility], [place] [address], [place], [place] [address]   MRSA Next Gen by PCR, Nasal     Status: None   Collection Time: 08/31/21 11:42 PM   Specimen: Nasal Mucosa; Nasal Swab  Result Value Ref Range Status   MRSA by PCR Next Gen NOT DETECTED NOT DETECTED Final    Comment: (NOTE) The GeneXpert MRSA Assay (FDA approved for NASAL specimens only), is one component of a comprehensive MRSA colonization surveillance program. It is not intended to diagnose MRSA infection nor to guide or monitor treatment for MRSA infections. Test performance is not FDA approved in patients less than 38 years old. Performed at [facility], [place] [address] [place],  [address]      Labs: BNP (last 3 results) Recent Labs    08/16/21 1810 08/20/21 2252 08/31/21 1921  BNP 449.6* 956.1* 7,342.8*   Basic Metabolic Panel: Recent Labs  Lab 08/31/21 2222 09/01/21 0517 09/02/21 0508 09/03/21 0355 09/03/21 1003 09/03/21 1304 09/03/21 1305 09/04/21 0322 09/05/21 0242  NA  --  132* 136 138  --  141 140 132* 135  K  --  3.2* 3.6 3.4*  --  3.4* 3.5 4.0 4.0  CL  --  97* 97* 98  --   --   --  96* 102  CO2  --  26 28 31   --   --   --  27 24  GLUCOSE  --  127* 146* 97  --   --   --  94 103*  BUN  --  15 19 14   --   --   --  10 15  CREATININE  --  0.90  0.98 1.01  --   --   --  1.11 0.95  CALCIUM  --  8.6* 8.6* 8.8*  --   --   --  9.2 9.4  MG 2.1 2.0  --   --  2.1  --   --  2.1  --   PHOS 3.1 2.9  --   --   --   --   --   --   --    Liver Function Tests: Recent Labs  Lab 08/31/21 2222 09/01/21 0517  AST 35 31  ALT 31 31  ALKPHOS 102 94  BILITOT 0.4 0.7  PROT 7.5 7.2  ALBUMIN 3.8 3.7   No results for input(s): LIPASE, AMYLASE in the last 168 hours. No results for input(s): AMMONIA in the last 168 hours. CBC: Recent Labs  Lab 08/31/21 1921 09/01/21 0517 09/03/21 1003 09/03/21 1304 09/03/21 1305  WBC 5.6 7.4 5.0  --   --   NEUTROABS  --  6.1  --   --   --   HGB 10.6* 9.8* 12.5* 13.3 13.6  HCT 35.7* 33.1* 42.3 39.0 40.0  MCV 83.2 81.9 81.5  --   --   PLT 308 288 378  --   --    Cardiac Enzymes: Recent Labs  Lab 08/31/21 2222  CKTOTAL 185   BNP: Invalid input(s): POCBNP CBG: No results for input(s): GLUCAP in the last 168 hours. D-Dimer No results for input(s): DDIMER in the last 72 hours. Hgb A1c No results for input(s): HGBA1C in the last 72 hours. Lipid Profile No results for input(s): CHOL, HDL, LDLCALC, TRIG, CHOLHDL, LDLDIRECT in the last 72 hours. Thyroid function studies No results for input(s): TSH, T4TOTAL, T3FREE, THYROIDAB in the last 72 hours.  Invalid input(s): FREET3 Anemia work up No results for input(s): VITAMINB12, FOLATE, FERRITIN, TIBC, IRON, RETICCTPCT in the last 72 hours. Urinalysis    Component Value Date/Time   COLORURINE YELLOW 04/14/2021 0025   APPEARANCEUR CLEAR 04/14/2021 0025   LABSPEC 1.023 04/14/2021 0025   PHURINE 5.0 04/14/2021 0025   GLUCOSEU NEGATIVE 04/14/2021 0025   HGBUR NEGATIVE 04/14/2021 0025   BILIRUBINUR NEGATIVE 04/14/2021 0025   KETONESUR NEGATIVE 04/14/2021 0025   PROTEINUR 30 (A) 04/14/2021 0025   NITRITE NEGATIVE 04/14/2021 0025   LEUKOCYTESUR NEGATIVE 04/14/2021 0025   Sepsis Labs Invalid input(s): PROCALCITONIN,  WBC,   LACTICIDVEN Microbiology Recent Results (from the past 240 hour(s))  Resp Panel by RT-PCR (Flu A&B, Covid) Nasopharyngeal Swab     Status: None   Collection Time: 08/31/21  5:51 AM   Specimen: Nasopharyngeal Swab; Nasopharyngeal(NP) swabs in vial transport medium  Result Value Ref Range Status   SARS Coronavirus 2 by RT PCR NEGATIVE NEGATIVE Final    Comment: (NOTE) SARS-CoV-2 target nucleic acids are NOT DETECTED.  The SARS-CoV-2 RNA is generally detectable in upper respiratory specimens during the acute phase of infection. The lowest concentration of SARS-CoV-2 viral copies this assay can detect is 138 copies/mL. A negative result does not preclude SARS-Cov-2 infection and should not be used as the sole basis for treatment or other patient management decisions. A negative result may occur with  improper specimen collection/handling, submission of specimen other than nasopharyngeal swab, presence of viral mutation(s) within the areas targeted by this assay, and inadequate number of viral copies(<138 copies/mL). A  negative result must be combined with clinical observations, patient history, and epidemiological information. The expected result is Negative.  Fact Sheet for Patients:  EntrepreneurPulse.com.au  Fact Sheet for Healthcare Providers:  IncredibleEmployment.be  This test is no t yet approved or cleared by the [place] FDA and  has been authorized for detection and/or diagnosis of SARS-CoV-2 by FDA under an Emergency Use Authorization (EUA). This EUA will remain  in effect (meaning this test can be used) for the duration of the COVID-19 declaration under Section 564(b)(1) of the Act, 21 U.S.C.section 360bbb-3(b)(1), unless the authorization is terminated  or revoked sooner.       Influenza A by PCR NEGATIVE NEGATIVE Final   Influenza B by PCR NEGATIVE NEGATIVE Final    Comment: (NOTE) The Xpert Xpress SARS-CoV-2/FLU/RSV plus  assay is intended as an aid in the diagnosis of influenza from Nasopharyngeal swab specimens and should not be used as a sole basis for treatment. Nasal washings and aspirates are unacceptable for Xpert Xpress SARS-CoV-2/FLU/RSV testing.  Fact Sheet for Patients: EntrepreneurPulse.com.au  Fact Sheet for Healthcare Providers: IncredibleEmployment.be  This test is not yet approved or cleared by the [place] FDA and has been authorized for detection and/or diagnosis of SARS-CoV-2 by FDA under an Emergency Use Authorization (EUA). This EUA will remain in effect (meaning this test can be used) for the duration of the COVID-19 declaration under Section 564(b)(1) of the Act, 21 U.S.C. section 360bbb-3(b)(1), unless the authorization is terminated or revoked.  Performed at [facility], [place] [address], [place], [place] [address]   MRSA Next Gen by PCR, Nasal     Status: None   Collection Time: 08/31/21 11:42 PM   Specimen: Nasal Mucosa; Nasal Swab  Result Value Ref Range Status   MRSA by PCR Next Gen NOT DETECTED NOT DETECTED Final    Comment: (NOTE) The GeneXpert MRSA Assay (FDA approved for NASAL specimens only), is one component of a comprehensive MRSA colonization surveillance program. It is not intended to diagnose MRSA infection nor to guide or monitor treatment for MRSA infections. Test performance is not FDA approved in patients less than 77 years old. Performed at [facility], [place] [address] [place], [place] [address]      Time coordinating discharge: 45 minutes  SIGNED:   Tawni Millers, MD  [facility] 09/05/2021, 8:38 AM

## 2021-09-05 NOTE — Progress Notes (Signed)
Patient ID: Leonard [name], male   DOB: Jan 31, 1976, 46 y.o.   MRN: [id number]     Advanced Heart Failure Rounding Note  PCP-Cardiologist: None   Subjective:    1/10 RHC/LHC  Prox LAD lesion is 40% stenosed.   Mid Cx lesion is 30% stenosed.   Ost LAD lesion is 30% stenosed.  1. Mild nonobstructive CAD, nonischemic cardiomyopathy.  2. Filling pressures near normal.  3. Mild pulmonary venous hypertension.  4. Preserved cardiac output.   Cardiac MRI: 1.  Mildly dilated LV with EF 22%, diffuse hypokinesis. 2.  Normal RV size with EF 27%. 3. No myocardial LGE, so no definitive evidence for prior MI, myocarditis, or infiltrative disease. 4. Abnormal aortic valve is thickened with at least mild stenosis and mild regurgitation.  No complaints this morning.  Ready to go home.  Good diuresis with po torsemide yesterday and creatinine stable.     Objective:   Weight Range: 114.3 kg Body mass index is 35.13 kg/m.   Vital Signs:   Temp:  [97.9 F (36.6 C)-98.2 F (36.8 C)] 97.9 F (36.6 C) (01/13 0512) Pulse Rate:  [96-105] 96 (01/13 0512) Resp:  [18-20] 18 (01/13 0512) BP: (90-119)/(55-100) 114/79 (01/13 0512) SpO2:  [92 %-98 %] 94 % (01/13 0817) Weight:  [114.3 kg] 114.3 kg (01/13 0512) Last BM Date: 08/31/21  Weight change: Filed Weights   09/03/21 0400 09/04/21 0347 09/05/21 0512  Weight: 116 kg 114.3 kg 114.3 kg    Intake/Output:   Intake/Output Summary (Last 24 hours) at 09/05/2021 1018 Last data filed at 09/05/2021 0700 Gross per 24 hour  Intake 887.61 ml  Output 3900 ml  Net -3012.39 ml      Physical Exam    General: NAD Neck: No JVD, no thyromegaly or thyroid nodule.  Lungs: Clear to auscultation bilaterally with normal respiratory effort. CV: Nondisplaced PMI.  Heart regular S1/S2, no S3/S4, 2/6 SEM RUSB.  No peripheral edema.   Abdomen: Soft, nontender, no hepatosplenomegaly, no distention.  Skin: Intact without lesions or rashes.  Neurologic: Alert and  oriented x 3.  Psych: Normal affect. Extremities: No clubbing or cyanosis.  HEENT: Normal.    Telemetry  ST 100s (personally reviewed)  EKG    N/A   Labs    CBC Recent Labs    09/03/21 1003 09/03/21 1304 09/03/21 1305  WBC 5.0  --   --   HGB 12.5* 13.3 13.6  HCT 42.3 39.0 40.0  MCV 81.5  --   --   PLT 378  --   --    Basic Metabolic Panel Recent Labs    09/03/21 1003 09/03/21 1304 09/04/21 0322 09/05/21 0242  NA  --    < > 132* 135  K  --    < > 4.0 4.0  CL  --   --  96* 102  CO2  --   --  27 24  GLUCOSE  --   --  94 103*  BUN  --   --  10 15  CREATININE  --   --  1.11 0.95  CALCIUM  --   --  9.2 9.4  MG 2.1  --  2.1  --    < > = values in this interval not displayed.   Liver Function Tests No results for input(s): AST, ALT, ALKPHOS, BILITOT, PROT, ALBUMIN in the last 72 hours.  No results for input(s): LIPASE, AMYLASE in the last 72 hours. Cardiac Enzymes No results for input(s): CKTOTAL, CKMB,  CKMBINDEX, TROPONINI in the last 72 hours.   BNP: BNP (last 3 results) Recent Labs    08/16/21 1810 08/20/21 2252 08/31/21 1921  BNP 449.6* 956.1* 1,530.6*    ProBNP (last 3 results) No results for input(s): PROBNP in the last 8760 hours.   D-Dimer No results for input(s): DDIMER in the last 72 hours. Hemoglobin A1C No results for input(s): HGBA1C in the last 72 hours. Fasting Lipid Panel No results for input(s): CHOL, HDL, LDLCALC, TRIG, CHOLHDL, LDLDIRECT in the last 72 hours.  Thyroid Function Tests No results for input(s): TSH, T4TOTAL, T3FREE, THYROIDAB in the last 72 hours.  Invalid input(s): FREET3   Other results:   Imaging    MR CARDIAC MORPHOLOGY W WO CONTRAST  Result Date: 09/04/2021 CLINICAL DATA:  Cardiomyopathy of uncertain etiology EXAM: CARDIAC MRI TECHNIQUE: The patient was scanned on a 1.5 Tesla GE magnet. A dedicated cardiac coil was used. Functional imaging was done using Fiesta sequences. 2,3, and 4 chamber views were  done to assess for RWMA's. Modified Simpson's rule using a short axis stack was used to calculate an ejection fraction on a dedicated work Conservation officer, nature. The patient received 15 cc of Gadavist. After 10 minutes inversion recovery sequences were used to assess for infiltration and scar tissue. CONTRAST:  Gadavist 15 cc FINDINGS: There appears to be scarring at the apex of the left lung. Mildly dilated left ventricle with normal wall thickness. Diffuse severe hypokinesis with EF 22%. Normal right ventricular size with EF 27%. Mild left atrial enlargement. Normal right atrium. No significant mitral regurgitation noted. The aortic valve appears thickened with restriction. It is trileaflet. There is mild aortic insufficiency. On delayed enhancement imaging, there is no late gadolinium enhancement (LGE). MEASUREMENTS: MEASUREMENTS LVEDV 234 mL LVSV 51 mL LVEF 22% RVEDV 214 mL RVSV 57 mL RVEF 27% IMPRESSION: 1.  Mildly dilated LV with EF 22%, diffuse hypokinesis. 2.  Normal RV size with EF 27%. 3. No myocardial LGE, so no definitive evidence for prior MI, myocarditis, or infiltrative disease. 4. Abnormal aortic valve is thickened with at least mild stenosis and mild regurgitation. Dayquan Buys Electronically Signed   By: Loralie Champagne M.D.   On: 09/04/2021 20:22     Medications:     Scheduled Medications:  amoxicillin-clavulanate  1 tablet Oral Q12H   aspirin  81 mg Oral Daily   atorvastatin  40 mg Oral Daily   carvedilol  3.125 mg Oral BID WC   dapagliflozin propanediol  10 mg Oral Daily   digoxin  0.125 mg Oral Daily   enoxaparin (LOVENOX) injection  60 mg Subcutaneous Q24H   gabapentin  200 mg Oral TID   levothyroxine  75 mcg Oral Q0600   mometasone-formoterol  2 puff Inhalation BID   pantoprazole  40 mg Oral Daily   sacubitril-valsartan  1 tablet Oral BID   sodium chloride flush  3 mL Intravenous Q12H   sodium chloride flush  3 mL Intravenous Q12H   spironolactone  25 mg Oral  Daily   torsemide  20 mg Oral Daily   umeclidinium bromide  1 puff Inhalation Daily    Infusions:  sodium chloride      PRN Medications: sodium chloride, acetaminophen **OR** acetaminophen, acetaminophen, albuterol, HYDROcodone-acetaminophen, HYDROcodone-acetaminophen, LORazepam, ondansetron (ZOFRAN) IV, polyethylene glycol, sodium chloride flush, traMADol    Patient Profile   46 y.o. male with history of chronic systolic CHF/NICM, non-Hodgkin's lymphoma s/p chemoradiation, HTN, noncompliance with medical therapy, hx cocaine and alcohol abuse.  Admitted with acute on chronic systolic CHF  Assessment/Plan   1. Acute on chronic systolic CHF: Nonischemic cardiomyopathy, echo this admission with EF 30-35%, mildly decreased RV function. Cath in 2017 showed no coronary disease, cath this admission with preserved CO and nonobstructive CAD.  He has a history of cocaine as well as prior Adriamycin for non-Hodgkin's lymphoma, either of which may contribute to CMP. ?ETOH contributing => had ER visits with ETOH abuse but denies current heavy ETOH. Cardiac MRI with EF 22%, no LGE.  Looks euvolemic on exam today and feels better.  - Home on torsemide 20 mg daily with KCl 10 daily.  - Continue digoxin, level ok.  - Entresto increased back to 49/51 bid.  - Continue dapagliflozin 10 daily.  - Continue spironolactone 25 daily.  - Continue Coreg 3.125 bid, he says he is now off cocaine but this would be the safest beta blocker.  2. Substance abuse: Cocaine, ?ETOH.  Advised cessation.  3. Hypothyroidism: Per primary service.  4. ?PNA: Right mid lung infiltrate but afebrile, PCT < 0.1. Off abx. Possible prior changes from radiation with lymphoma.  - Course of Augmentin.  5. ?Syncope/presyncope: Possible orthostatic events.  BP has been elevated here.  EF has been low but does not have ICD due to substance abuse.  - If he can remain abstinent from cocaine/ETOH, think he could get ICD.  6. Anemia: Fe  deficiency.   - Has had feraheme.  7. 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  Home today on current HF meds.  Followup arranged.    Loralie Champagne 09/05/2021 10:18 AM

## 2021-09-08 ENCOUNTER — Telehealth (HOSPITAL_COMMUNITY): Payer: Self-pay | Admitting: Licensed Clinical Social Worker

## 2021-09-08 NOTE — Telephone Encounter (Signed)
Paramedicine Initial Assessment:  Housing:  In what kind of housing do you live? House/apt/trailer/shelter? trailer  Do you live with anyone? Wife and three children  Are you currently worried about losing your housing? Not currently but might if he is not able to continue working  Social:  What is your current marital status? married  Do you have any children? 1 biological child and two children of his wifes living in the home  Food:  Was receiving food stamps but stopped cause he needed to re-certify which he has not yet done- encouraged pt to bring the paperwork to his appt on Thursday  Income:  What is your current source of income? 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 medicaid  Do you have prescription coverage? Says medicaid covers it- appears to get Family and Children Medicaid   Transportation:  Do you have transportation to your medical appointments? Doesn't have a license at this time so relies on his girlfriend to get to appts but she is not always available with her work schedule.    Daily Health Needs: Do you have a working scale at home? yes  Do you ever take your medications differently than prescribed? Not on purpose but admits his medications have gotten confusing and he has trouble remembering things.  Do you have issues affording your medications? Sometimes- has inexpensive medications but if he won't be able to keep working he has concerns about being able to afford.  Do you have any concerns with mobility at home? no  Do you have a PCP? Getting new PCP- has appt in February  Do you have any trouble reading or writing? no   Are there any additional barriers you see to getting the care you need? CSW brought up pt recent history of substance abuse.  Pt states he is on probation and getting drug tested regularly- in addition to this he goes to NA and AA meetings 3-4 times a week.   Agreeable to meet and discuss if he needs further support.  CSW will continue to follow through paramedicine program and assist as needed.  Jorge Ny, LCSW Clinical Social Worker [facility] Desk#: [phone] Cell#: [phone]

## 2021-09-10 NOTE — Progress Notes (Addendum)
PCP: Isaias Cowman, PA-C Primary Cardiologist: Dr. Aundra Dubin  HPI: 46 y.o. male with history of non-Hodgkin's lymphoma s/p Chemoradiation at [facility] - currently in remission, hx cocaine abuse and alcohol abuse, HTN, Asthma, GERD, suspected OSA, COPD, iron deficiency anemia, dental caries with dental abscesses 12/22, hx presyncope/syncope and chronic pain.     Admitted 04/05/16 with worsening SOB and peripheral edema x 2 months.  Echo 04/07/16 LVEF 25-30%, Grade 2 DD, Mild AR, Mild MR, PA peak pressure 47 mm Hg.  [place] 04/15/16 with mild CAD, marginal cardiac output and RA 13.    Echo 09/2016: EF 40-45%, RV okay   Previously followed in HF clinic by Dr. Aundra Dubin. Was lost to follow-up since 2018.    Multiple ED visits in recent months for acute on chronic CHF. He was admitted to [facility] on 08/01/21 for acute on chronic systolic CHF. UDS positive for cocaine. Had not been adherent with medical therapy. Echo during admit with EF 30-35%, RV okay. Diuresed with IV lasix.    Subsequently no-showed his cardiology follow-up at [facility] and did not return phone calls to their HF clinic.   Seen in ED on 12/24 with volume overload and given 40 mg lasix IV.  Prescribed po antibiotics for dental abscess. Returned to ED 12/28 for worsening dyspnea after using cocaine for a few days. Left AMA.  Readmitted to [facility] 08/31/21 with acute on chronic systolic CHF. Diuresed with lasix gtt then transitioned to po Torsemide 20 mg daily. Started on GDMT with digoxin, entresto, spironolactone, coreg and dapagliflozin. R/LHC 09/02/21 with mild nonobstructive CAD, near normal filling pressures, mild pulmonary venous hypertension, preserved CO. cMRI with EF 22%, no LGE. During admit given feraheme for iron deficiency anemia and treated with abx for possible pneumonia. Right mid lung infiltrate noted on imaging, ? Possible changes from prior radiation.   Here today for hospital follow-up.  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  Weight at home on discharge was 250 lb, now up to about 258-259 lb. However, feels dehydrated. Has been urinating quite a bit and reports orthostatic dizziness. Had a little bit of dyspnea just one day. No orthopnea or PND. Lower extremity edema resolved. Has been watching fluid and sodium intake.   Has not consumed alcohol or used cocaine since admission.   ROS: All systems negative except as listed in HPI, PMH and Problem List.  SH:  Social History   Socioeconomic History   Marital status: Married    Spouse name: Not on file   Number of children: Not on file   Years of education: Not on file   Highest education level: Not on file  Occupational History   Not on file  Tobacco Use   Smoking status: Former    Packs/day: 1.25    Years: 15.00    Pack years: 18.75    Types: Cigarettes    Quit date: 04/08/2007    Years since quitting: 14.4   Smokeless tobacco: Current    Types: Snuff  Vaping Use   Vaping Use: Never used  Substance and Sexual Activity   Alcohol use: No    Alcohol/week: 0.0 standard drinks   Drug use: Not Currently    Types: Cocaine    Comment: last used yesterday    Sexual activity: Yes  Other Topics Concern   Not on file  Social History Narrative   Not on file   Social Determinants of Health   Financial Resource Strain: Medium Risk  Difficulty of Paying Living Expenses: Somewhat hard  Food Insecurity: Food Insecurity Present   Worried About Charity fundraiser in the Last Year: Sometimes true   Ran Out of Food in the Last Year: Sometimes true  Transportation Needs: Unmet Transportation Needs   Lack of Transportation (Medical): Yes   Lack of Transportation (Non-Medical): Yes  Physical Activity: Not on file  Stress: Not on file  Social Connections: Not on file  Intimate Partner Violence: Not on file    FH:  Family History  Problem Relation Age of Onset   Cancer Mother    Emphysema Mother    Bronchitis Mother     Past  Medical History:  Diagnosis Date   AKI (acute kidney injury) ([place]) 11/09/2016   Asthma    CHF (congestive heart failure) ([place]) 04/05/2016   Chronic bronchitis (H[facility])    Chronic lower back pain    Cocaine abuse ([place]) 09/03/2017   COPD (chronic obstructive pulmonary disease) ([place])    GERD (gastroesophageal reflux disease)    Headache    "weekly" (09/03/2017)   History of blood transfusion    "related to CA" (09/03/2017)   Hypertension    Hypothyroidism    Non Hodgkin's lymphoma ([place]) 03/2002   "stage IV"   Non Hodgkin's lymphoma ([place]) 04/2012   "stage II"   Pneumonia    "several times" (09/03/2017)   Sleep apnea    "went to sleep study; didn't have my normal episodes of choking /waking up not able to breathe" (09/03/2017)    Current Outpatient Medications  Medication Sig Dispense Refill   aspirin 81 MG EC tablet Take 81 mg by mouth daily.     atorvastatin (LIPITOR) 40 MG tablet Take 1 tablet (40 mg total) by mouth daily. 30 tablet 0   carvedilol (COREG) 3.125 MG tablet Take 1 tablet (3.125 mg total) by mouth 2 (two) times daily with a meal. 60 tablet 0   cyclobenzaprine (FLEXERIL) 10 MG tablet Take 10 mg by mouth 3 (three) times daily as needed.     dapagliflozin propanediol (FARXIGA) 10 MG TABS tablet Take 1 tablet (10 mg total) by mouth daily. 30 tablet 0   digoxin (LANOXIN) 0.125 MG tablet Take 0.25 mg by mouth daily.     gabapentin (NEURONTIN) 300 MG capsule Take 3 capsules (900 mg total) by mouth 3 (three) times daily.     HYDROcodone-acetaminophen (NORCO/VICODIN) 5-325 MG tablet Take 1 tablet by mouth every 8 (eight) hours as needed for severe pain. 10 tablet 0   levothyroxine (SYNTHROID) 75 MCG tablet Take 75 mcg by mouth daily.     pantoprazole (PROTONIX) 40 MG tablet Take 40 mg by mouth daily.     sacubitril-valsartan (ENTRESTO) 49-51 MG Take 1 tablet by mouth 2 (two) times daily. 60 tablet 0   spironolactone (ALDACTONE) 25 MG tablet Take 1 tablet (25 mg total) by mouth daily.  30 tablet 0   acetaminophen (TYLENOL) 325 MG tablet Take 2 tablets (650 mg total) by mouth every 4 (four) hours as needed for headache or mild pain.     albuterol (PROVENTIL) (2.5 MG/3ML) 0.083% nebulizer solution use 1 vial (2.5 mg total) by nebulization every 2 (two) hours as needed for wheezing. 90 mL 0   fluticasone-salmeterol (ADVAIR) 500-50 MCG/ACT AEPB Inhale 1 puff into the lungs in the morning and at bedtime.     potassium chloride (KLOR-CON M) 10 MEQ tablet Take 1 tablet (10 mEq total) by mouth as needed. 30 tablet 1  PROAIR HFA 108 (90 Base) MCG/ACT inhaler Inhale 2 puffs into the lungs every 4 (four) hours as needed.     tiotropium (SPIRIVA) 18 MCG inhalation capsule Place 18 mcg into inhaler and inhale daily.     torsemide (DEMADEX) 20 MG tablet Take 1 tablet (20 mg total) by mouth as needed. 30 tablet 1   triamcinolone cream (KENALOG) 0.1 % Apply 1 application topically 2 (two) times daily.     No current facility-administered medications for this encounter.    Vitals:   09/11/21 1407 09/11/21 1457  BP: (!) 140/108 120/80  Pulse: (!) 115   SpO2: 98%   Weight: 118.2 kg (260 lb 9.6 oz)     PHYSICAL EXAM:  General:  No distress. Ambulated into clinic. HEENT: normal Neck: supple. JVP flat. Carotids 2+ bilaterally; no bruits. No lymphadenopathy or thryomegaly appreciated. Cor: PMI normal. Regular rate & rhythm. No rubs, gallops or murmurs. Lungs: clear Abdomen: soft, nontender, nondistended. No hepatosplenomegaly. No bruits or masses. Good bowel sounds. Extremities: no cyanosis, clubbing, rash, edema Neuro: alert & orientedx3, cranial nerves grossly intact. Moves all 4 extremities w/o difficulty. Affect pleasant.   ECG: Sinus tach 113 bpm, IRBBB   ASSESSMENT & PLAN:  1. Chronic systolic CHF: Nonischemic cardiomyopathy, echo this admission with EF 30-35%, mildly decreased RV function. Cath in 2017 showed no coronary disease, cath 01/23 admission with preserved CO and  nonobstructive CAD.  He has a history of cocaine as well as prior Adriamycin for non-Hodgkin's lymphoma, either of which may contribute to CMP. ?ETOH contributing => had ER visits with ETOH abuse but denies current heavy ETOH. Cardiac MRI with EF 22%, no LGE.   - NYHA II. Volume appears stable. ReDS 29%. - Complaining of positional dizziness. Positive orthostatics. Will have him decrease Torsemide to just PRN. Take K supplement only with Torsemide. Has a good understanding of when he needs diuretic.  - Continue digoxin, level ok on 01/13.  - Continue Entresto 49/51 bid.  - Continue dapagliflozin 10 daily.  - Continue spironolactone 25 daily.  - Continue Coreg 3.125 bid, he says he is now off cocaine but this would be the safest beta blocker.  - BMET today 2. Substance abuse: Cocaine, ?ETOH.  Advised cessation. Has refrained since hospital discharge. 3. Hypothyroidism: Per PCP. Not currently taking Synthroid. He wasn't sure if it was necessary, planning to restart today. 4. Hx ?Syncope/presyncope: Possible orthostatic events.  + orthostatics today. Med changes as above - EF has been low but does not have ICD due to substance abuse.  - If he can remain abstinent from cocaine/ETOH, think he could get ICD.  6. Anemia: Fe deficiency.   - Had feraheme during recent admit. - Hgb 12.5 on 01/11 7. 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 Will need to monitor  Meeting with social worker for SDOH needs. Working on [employer] for [employer].  Has appointment in February to establish with a new PCP.  Follow-up: 3 weeks with APP to assess volume and medication titration, 3 months with Dr. Aundra Dubin with echo

## 2021-09-11 ENCOUNTER — Encounter (HOSPITAL_COMMUNITY): Payer: Self-pay | Admitting: Cardiology

## 2021-09-11 ENCOUNTER — Ambulatory Visit (HOSPITAL_COMMUNITY)
Admit: 2021-09-11 | Discharge: 2021-09-11 | Disposition: A | Discharge status: Home / Self Care | Payer: Medicaid Other | Attending: Physician Assistant | Admitting: Physician Assistant

## 2021-09-11 ENCOUNTER — Other Ambulatory Visit: Payer: Self-pay

## 2021-09-11 ENCOUNTER — Other Ambulatory Visit (HOSPITAL_COMMUNITY): Payer: Self-pay

## 2021-09-11 VITALS — BP 120/80 | HR 115 | Wt 260.6 lb

## 2021-09-11 DIAGNOSIS — J449 Chronic obstructive pulmonary disease, unspecified: Secondary | ICD-10-CM | POA: Diagnosis not present

## 2021-09-11 DIAGNOSIS — I509 Heart failure, unspecified: Secondary | ICD-10-CM | POA: Insufficient documentation

## 2021-09-11 DIAGNOSIS — E039 Hypothyroidism, unspecified: Secondary | ICD-10-CM | POA: Insufficient documentation

## 2021-09-11 DIAGNOSIS — D509 Iron deficiency anemia, unspecified: Secondary | ICD-10-CM | POA: Diagnosis not present

## 2021-09-11 DIAGNOSIS — I5023 Acute on chronic systolic (congestive) heart failure: Secondary | ICD-10-CM | POA: Diagnosis present

## 2021-09-11 DIAGNOSIS — Z923 Personal history of irradiation: Secondary | ICD-10-CM | POA: Diagnosis not present

## 2021-09-11 DIAGNOSIS — C8590 Non-Hodgkin lymphoma, unspecified, unspecified site: Secondary | ICD-10-CM | POA: Diagnosis not present

## 2021-09-11 DIAGNOSIS — R55 Syncope and collapse: Secondary | ICD-10-CM | POA: Diagnosis not present

## 2021-09-11 DIAGNOSIS — F1910 Other psychoactive substance abuse, uncomplicated: Secondary | ICD-10-CM

## 2021-09-11 DIAGNOSIS — K219 Gastro-esophageal reflux disease without esophagitis: Secondary | ICD-10-CM | POA: Insufficient documentation

## 2021-09-11 DIAGNOSIS — Z9221 Personal history of antineoplastic chemotherapy: Secondary | ICD-10-CM | POA: Insufficient documentation

## 2021-09-11 DIAGNOSIS — I359 Nonrheumatic aortic valve disorder, unspecified: Secondary | ICD-10-CM | POA: Diagnosis not present

## 2021-09-11 DIAGNOSIS — I110 Hypertensive heart disease with heart failure: Secondary | ICD-10-CM | POA: Diagnosis not present

## 2021-09-11 DIAGNOSIS — I2510 Atherosclerotic heart disease of native coronary artery without angina pectoris: Secondary | ICD-10-CM | POA: Insufficient documentation

## 2021-09-11 DIAGNOSIS — I5022 Chronic systolic (congestive) heart failure: Secondary | ICD-10-CM | POA: Diagnosis not present

## 2021-09-11 DIAGNOSIS — I428 Other cardiomyopathies: Secondary | ICD-10-CM | POA: Diagnosis not present

## 2021-09-11 LAB — BASIC METABOLIC PANEL
Anion gap: 17 — ABNORMAL HIGH (ref 5–15)
BUN: 24 mg/dL — ABNORMAL HIGH (ref 6–20)
CO2: 29 mmol/L (ref 22–32)
Calcium: 10.7 mg/dL — ABNORMAL HIGH (ref 8.9–10.3)
Chloride: 96 mmol/L — ABNORMAL LOW (ref 98–111)
Creatinine, Ser: 1.05 mg/dL (ref 0.61–1.24)
GFR, Estimated: >60 mL/min (ref >60)
Glucose, Bld: 99 mg/dL (ref 70–99)
Potassium: 4.2 mmol/L (ref 3.5–5.1)
Sodium: 142 mmol/L (ref 135–145)

## 2021-09-11 MED ORDER — POTASSIUM CHLORIDE CRYS ER 10 MEQ PO TBCR: 10.0000 meq | EXTENDED_RELEASE_TABLET | ORAL | 1 refills | Status: AC | PRN

## 2021-09-11 MED ORDER — TORSEMIDE 20 MG PO TABS: 20.0000 mg | ORAL_TABLET | ORAL | 1 refills | Status: DC | PRN

## 2021-09-11 NOTE — Patient Instructions (Signed)
CHANGE Torsemide 20 mg, to as needed for swelling, shortness of breath, or weight gain 3 lbs overnight or 5 lbs in a week  CHANGE Potassium 20 meq to as needed with every dose of Torsemide  Labs today We will only contact you if something comes back abnormal or we need to make some changes. Otherwise no news is good news!  Your physician recommends that you schedule a follow-up appointment in: 3 weeks  in the Advanced Practitioners (PA/NP) Clinic and in 3 months with Dr Aundra Dubin  Your physician has requested that you have an echocardiogram. Echocardiography is a painless test that uses sound waves to create images of your heart. It provides your doctor with information about the size and shape of your heart and how well your hearts chambers and valves are working. This procedure takes approximately one hour. There are no restrictions for this procedure.  Do the following things EVERYDAY: Weigh yourself in the morning before breakfast. Write it down and keep it in a log. Take your medicines as prescribed Eat low salt foods--Limit salt (sodium) to 2000 mg per day.  Stay as active as you can everyday Limit all fluids for the day to less than 2 liters  At the [facility], you and your health needs are our priority. As part of our continuing mission to provide you with exceptional heart care, we have created designated Provider Care Teams. These Care Teams include your primary Cardiologist (physician) and Advanced Practice Providers (APPs- Physician Assistants and Nurse Practitioners) who all work together to provide you with the care you need, when you need it.   You may see any of the following providers on your designated Care Team at your next follow up: Dr Glori Bickers Dr Haynes Kerns, NP Lyda Jester, [place] [facility], [place] Audry Riles, PharmD   Please be sure to bring in all your medications bottles to every appointment.

## 2021-09-11 NOTE — Progress Notes (Signed)
ReDS Vest / Clip - 09/11/21 1407       ReDS Vest / Clip   Station Marker D    Ruler Value 39    ReDS Value Range Low volume    ReDS Actual Value 29

## 2021-09-11 NOTE — Progress Notes (Signed)
Paramedicine Encounter   Patient ID: Jovany Disano , male,   DOB: 1976/04/16,45 y.o.,  MRN: [id number]   Weight @ clinic-260 B/p-140/108>120/80 standing  P-115 Sp02-98 REDS CLIP-29%   First meeting with pt since referral.  Dry weight for him 258.   Torsemide is changing to PRN along with one potassium.  Due to him being orthostatic.   Has PCP visit coming up to establish with [facility] is a month out-he reports needing pain meds refilled-jenna is going to see if that appointment can be moved up.  He reports wife let him move back home from [place] since he has gotten clean now.  PCP will have to refer for any other specialities now.  Pt did mention he will utilize ER if needed for pain mgmt.  He was no show to his neuro appoint so he is not allowed to go back to that office due to their no show policy-per pt.  He wants to switch his pharmacy to [facility] at [address]  He lives at home with wife and children.  He uses [facility] at times.   He is going to call [facility] to get his meds from [facility] was using in [place].  He has 1 refill left on his cyclobenzaprine.  He has a large amount of levothyroxine left in bottle-he has mixed several bottles together-2 different shapes-last filled was 12/17 so I advised him to get it refilled as well and throw the other ones out and start over.   He is involved in lots of programs in the community to stay clean. His drug of choice was cocaine.  He states he goes somewhere every night like NA/AA/church groups.   Met patient in clinic today with provider.   Marylouise Stacks, [place] 09/11/2021

## 2021-09-11 NOTE — Progress Notes (Signed)
CSW spoke with pt regarding food stamp- pt was supposed to bring in his recertification paperwork but was confused what I was asking for and doesn't have it with him.  Michela Pitcher it arrived 3 months ago so pt recertification time would be over- told him he would need to reapply from scratch or have his wife call her case worker to have him added back on the food stamps now that he is back in the household.  Pt concerned that he does not have a script for his pain meds- states he needs them to be able to work.  Has PCP appt scheduled but almost a month out and he is afraid he will lose his job.  CSW messaged office to request sooner appt- awaiting response.  Jorge Ny, LCSW Clinical Social Worker [facility] Desk#: [phone] Cell#: [phone]

## 2021-09-11 NOTE — Addendum Note (Signed)
Encounter addended by: Jorge Ny, LCSW on: 09/11/2021 4:22 PM  Actions taken: Clinical Note Signed

## 2021-09-12 ENCOUNTER — Telehealth (HOSPITAL_COMMUNITY): Payer: Self-pay | Admitting: Licensed Clinical Social Worker

## 2021-09-12 ENCOUNTER — Other Ambulatory Visit (HOSPITAL_COMMUNITY): Payer: Self-pay

## 2021-09-12 DIAGNOSIS — I509 Heart failure, unspecified: Secondary | ICD-10-CM

## 2021-09-12 NOTE — Progress Notes (Signed)
Erroneous encounter

## 2021-09-12 NOTE — Telephone Encounter (Signed)
CSW heard back from [facility] with [facility] who was able to help get pt scheduled for sooner appt at [facility] at [facility]- can now be seen 1/24.  CSW informed pt- he will need a ride so set up through [employer]  Will continue to follow and assist as needed  Jorge Ny, [facility] Desk#: [phone] Cell#: [phone]

## 2021-09-15 ENCOUNTER — Other Ambulatory Visit (HOSPITAL_COMMUNITY): Payer: Self-pay | Admitting: Cardiology

## 2021-09-15 DIAGNOSIS — I5022 Chronic systolic (congestive) heart failure: Secondary | ICD-10-CM

## 2021-09-16 ENCOUNTER — Encounter: Payer: Medicaid Other | Admitting: Family

## 2021-09-16 DIAGNOSIS — Z09 Encounter for follow-up examination after completed treatment for conditions other than malignant neoplasm: Secondary | ICD-10-CM

## 2021-09-16 DIAGNOSIS — K05219 Aggressive periodontitis, localized, unspecified severity: Secondary | ICD-10-CM

## 2021-09-16 DIAGNOSIS — Z8572 Personal history of non-Hodgkin lymphomas: Secondary | ICD-10-CM

## 2021-09-16 DIAGNOSIS — F1910 Other psychoactive substance abuse, uncomplicated: Secondary | ICD-10-CM

## 2021-09-16 DIAGNOSIS — F1410 Cocaine abuse, uncomplicated: Secondary | ICD-10-CM

## 2021-09-16 DIAGNOSIS — J449 Chronic obstructive pulmonary disease, unspecified: Secondary | ICD-10-CM

## 2021-09-16 DIAGNOSIS — I5043 Acute on chronic combined systolic (congestive) and diastolic (congestive) heart failure: Secondary | ICD-10-CM

## 2021-09-16 DIAGNOSIS — D638 Anemia in other chronic diseases classified elsewhere: Secondary | ICD-10-CM

## 2021-09-16 DIAGNOSIS — E039 Hypothyroidism, unspecified: Secondary | ICD-10-CM

## 2021-09-16 DIAGNOSIS — G629 Polyneuropathy, unspecified: Secondary | ICD-10-CM

## 2021-09-18 ENCOUNTER — Other Ambulatory Visit (HOSPITAL_COMMUNITY): Payer: Self-pay | Admitting: *Deleted

## 2021-09-18 MED ORDER — TORSEMIDE 20 MG PO TABS: 20.0000 mg | ORAL_TABLET | ORAL | 1 refills | Status: AC | PRN

## 2021-09-22 ENCOUNTER — Telehealth (HOSPITAL_COMMUNITY): Payer: Self-pay

## 2021-09-22 NOTE — Telephone Encounter (Signed)
Left message for Leonard [name] to return my call to set home paramedicine home visit. I will continue to reach out.

## 2021-09-24 ENCOUNTER — Other Ambulatory Visit (HOSPITAL_COMMUNITY): Payer: Medicaid Other

## 2021-09-30 NOTE — Progress Notes (Incomplete)
PCP: Isaias Cowman, PA-C Primary Cardiologist: Dr. Aundra Dubin  HPI: 46 y.o. male with history of non-Hodgkin's lymphoma s/p Chemoradiation at [facility] - currently in remission, hx cocaine abuse and alcohol abuse, HTN, Asthma, GERD, suspected OSA, COPD, iron deficiency anemia, dental caries with dental abscesses 12/22, hx presyncope/syncope and chronic pain.     Admitted 04/05/16 with worsening SOB and peripheral edema x 2 months.  Echo 04/07/16 LVEF 25-30%, Grade 2 DD, Mild AR, Mild MR, PA peak pressure 47 mm Hg.  [facility] 04/15/16 with mild CAD, marginal cardiac output and RA 13.    Echo 09/2016: EF 40-45%, RV okay   Previously followed in HF clinic by Dr. Aundra Dubin. Was lost to follow-up since 2018.    Multiple ED visits in recent months for acute on chronic CHF. He was admitted to [facility] on 08/01/21 for acute on chronic systolic CHF. UDS positive for cocaine. Had not been adherent with medical therapy. Echo during admit with EF 30-35%, RV okay. Diuresed with IV lasix.    Subsequently no-showed his cardiology follow-up at [facility] and did not return phone calls to their HF clinic.   Seen in ED on 12/24 with volume overload and given 40 mg lasix IV.  Prescribed po antibiotics for dental abscess. Returned to ED 12/28 for worsening dyspnea after using cocaine for a few days. Left AMA.  Readmitted to [facility] 08/31/21 with acute on chronic systolic CHF. Diuresed with lasix gtt then transitioned to po Torsemide 20 mg daily. Started on GDMT with digoxin, entresto, spironolactone, coreg and dapagliflozin. R/[facility]HC 09/02/21 with mild nonobstructive CAD, near normal filling pressures, mild pulmonary venous hypertension, preserved CO. cMRI with EF 22%, no LGE. During admit given feraheme for iron deficiency anemia and treated with abx for possible pneumonia. Right mid lung infiltrate noted on imaging, ? Possible changes from prior radiation.   Here today for hospital follow-up.  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  Weight at home on discharge was 250 lb, now up to about 258-259 lb. However, feels dehydrated. Has been urinating quite a bit and reports orthostatic dizziness. Had a little bit of dyspnea just one day. No orthopnea or PND. Lower extremity edema resolved. Has been watching fluid and sodium intake.   Has not consumed alcohol or used cocaine since admission.   ROS: All systems negative except as listed in HPI, PMH and Problem List.  SH:  Social History   Socioeconomic History   Marital status: Married    Spouse name: Not on file   Number of children: Not on file   Years of education: Not on file   Highest education level: Not on file  Occupational History   Not on file  Tobacco Use   Smoking status: Former    Packs/day: 1.25    Years: 15.00    Pack years: 18.75    Types: Cigarettes    Quit date: 04/08/2007    Years since quitting: 14.4   Smokeless tobacco: Current    Types: Snuff  Vaping Use   Vaping Use: Never used  Substance and Sexual Activity   Alcohol use: No    Alcohol/week: 0.0 standard drinks   Drug use: Not Currently    Types: Cocaine    Comment: last used yesterday    Sexual activity: Yes  Other Topics Concern   Not on file  Social History Narrative   Not on file   Social Determinants of Health   Financial Resource Strain: Medium Risk  Difficulty of Paying Living Expenses: Somewhat hard  Food Insecurity: Food Insecurity Present   Worried About Charity fundraiser in the Last Year: Sometimes true   Ran Out of Food in the Last Year: Sometimes true  Transportation Needs: Unmet Transportation Needs   Lack of Transportation (Medical): Yes   Lack of Transportation (Non-Medical): Yes  Physical Activity: Not on file  Stress: Not on file  Social Connections: Not on file  Intimate Partner Violence: Not on file    FH:  Family History  Problem Relation Age of Onset   Cancer Mother    Emphysema Mother    Bronchitis Mother     Past  Medical History:  Diagnosis Date   AKI (acute kidney injury) ([place]) 11/09/2016   Asthma    CHF (congestive heart failure) ([place]) 04/05/2016   Chronic bronchitis (HCC)    Chronic lower back pain    Cocaine abuse ([place]) 09/03/2017   COPD (chronic obstructive pulmonary disease) ([place])    GERD (gastroesophageal reflux disease)    Headache    "weekly" (09/03/2017)   History of blood transfusion    "related to CA" (09/03/2017)   Hypertension    Hypothyroidism    Non Hodgkin's lymphoma ([place]) 03/2002   "stage IV"   Non Hodgkin's lymphoma ([place]) 04/2012   "stage II"   Pneumonia    "several times" (09/03/2017)   Sleep apnea    "went to sleep study; didn't have my normal episodes of choking /waking up not able to breathe" (09/03/2017)    Current Outpatient Medications  Medication Sig Dispense Refill   acetaminophen (TYLENOL) 325 MG tablet Take 2 tablets (650 mg total) by mouth every 4 (four) hours as needed for headache or mild pain.     albuterol (PROVENTIL) (2.5 MG/3ML) 0.083% nebulizer solution use 1 vial (2.5 mg total) by nebulization every 2 (two) hours as needed for wheezing. 90 mL 0   aspirin 81 MG EC tablet Take 81 mg by mouth daily.     atorvastatin (LIPITOR) 40 MG tablet Take 1 tablet (40 mg total) by mouth daily. 30 tablet 0   carvedilol (COREG) 3.125 MG tablet Take 1 tablet (3.125 mg total) by mouth 2 (two) times daily with a meal. 60 tablet 0   cyclobenzaprine (FLEXERIL) 10 MG tablet Take 10 mg by mouth 3 (three) times daily as needed.     dapagliflozin propanediol (FARXIGA) 10 MG TABS tablet Take 1 tablet (10 mg total) by mouth daily. 30 tablet 0   digoxin (LANOXIN) 0.125 MG tablet Take 0.25 mg by mouth daily.     fluticasone-salmeterol (ADVAIR) 500-50 MCG/ACT AEPB Inhale 1 puff into the lungs in the morning and at bedtime.     gabapentin (NEURONTIN) 300 MG capsule Take 3 capsules (900 mg total) by mouth 3 (three) times daily.     HYDROcodone-acetaminophen (NORCO/VICODIN) 5-325 MG tablet  Take 1 tablet by mouth every 8 (eight) hours as needed for severe pain. 10 tablet 0   levothyroxine (SYNTHROID) 75 MCG tablet Take 75 mcg by mouth daily.     pantoprazole (PROTONIX) 40 MG tablet Take 40 mg by mouth daily.     potassium chloride (KLOR-CON M) 10 MEQ tablet Take 1 tablet (10 mEq total) by mouth as needed. 30 tablet 1   PROAIR HFA 108 (90 Base) MCG/ACT inhaler Inhale 2 puffs into the lungs every 4 (four) hours as needed.     sacubitril-valsartan (ENTRESTO) 49-51 MG Take 1 tablet by mouth 2 (two) times daily.  60 tablet 0   spironolactone (ALDACTONE) 25 MG tablet Take 1 tablet (25 mg total) by mouth daily. 30 tablet 0   tiotropium (SPIRIVA) 18 MCG inhalation capsule Place 18 mcg into inhaler and inhale daily.     torsemide (DEMADEX) 20 MG tablet Take 1 tablet (20 mg total) by mouth as needed. 30 tablet 1   triamcinolone cream (KENALOG) 0.1 % Apply 1 application topically 2 (two) times daily.     No current facility-administered medications for this visit.    There were no vitals filed for this visit.   PHYSICAL EXAM:  General:  No distress. Ambulated into clinic. HEENT: normal Neck: supple. JVP flat. Carotids 2+ bilaterally; no bruits. No lymphadenopathy or thryomegaly appreciated. Cor: PMI normal. Regular rate & rhythm. No rubs, gallops or murmurs. Lungs: clear Abdomen: soft, nontender, nondistended. No hepatosplenomegaly. No bruits or masses. Good bowel sounds. Extremities: no cyanosis, clubbing, rash, edema Neuro: alert & orientedx3, cranial nerves grossly intact. Moves all 4 extremities w/o difficulty. Affect pleasant.   ECG: Sinus tach 113 bpm, IRBBB   ASSESSMENT & PLAN:  1. Chronic systolic CHF: Nonischemic cardiomyopathy, echo this admission with EF 30-35%, mildly decreased RV function. Cath in 2017 showed no coronary disease, cath 01/23 admission with preserved CO and nonobstructive CAD.  He has a history of cocaine as well as prior Adriamycin for non-Hodgkin's  lymphoma, either of which may contribute to CMP. ?ETOH contributing => had ER visits with ETOH abuse but denies current heavy ETOH. Cardiac MRI with EF 22%, no LGE.   - NYHA II. Volume appears stable. ReDS 29%. - Complaining of positional dizziness. Positive orthostatics. Will have him decrease Torsemide to just PRN. Take K supplement only with Torsemide. Has a good understanding of when he needs diuretic.  - Continue digoxin, level ok on 01/13.  - Continue Entresto 49/51 bid.  - Continue dapagliflozin 10 daily.  - Continue spironolactone 25 daily.  - Continue Coreg 3.125 bid, he says he is now off cocaine but this would be the safest beta blocker.  - BMET today 2. Substance abuse: Cocaine, ?ETOH.  Advised cessation. Has refrained since hospital discharge. 3. Hypothyroidism: Per PCP. Not currently taking Synthroid. He wasn't sure if it was necessary, planning to restart today. 4. Hx ?Syncope/presyncope: Possible orthostatic events.  + orthostatics today. Med changes as above - EF has been low but does not have ICD due to substance abuse.  - If he can remain abstinent from cocaine/ETOH, think he could get ICD.  6. Anemia: Fe deficiency.   - Had feraheme during recent admit. - Hgb 12.5 on 01/11 7. 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 Will need to monitor  Meeting with social worker for SDOH needs. Working on [employer] for [employer].  Has appointment in February to establish with a new PCP.  Follow-up: 3 weeks with APP to assess volume and medication titration, 3 months with Dr. Aundra Dubin with echo

## 2021-10-01 ENCOUNTER — Telehealth (HOSPITAL_COMMUNITY): Payer: Self-pay

## 2021-10-01 NOTE — Telephone Encounter (Signed)
Left message for Mr. Debose to schedule home paramedicine visit. I will continue to reach out.

## 2021-10-02 ENCOUNTER — Telehealth (HOSPITAL_COMMUNITY): Payer: Self-pay

## 2021-10-02 NOTE — Telephone Encounter (Signed)
Called and  patient's phone number was not in service to confirm/remind patient of their appointment at the [facility] on 10/03/21.

## 2021-10-03 ENCOUNTER — Encounter (HOSPITAL_COMMUNITY): Payer: Medicaid Other

## 2021-10-14 ENCOUNTER — Inpatient Hospital Stay: Payer: Medicaid Other | Admitting: Family

## 2021-10-16 ENCOUNTER — Telehealth (HOSPITAL_COMMUNITY): Payer: Self-pay

## 2021-10-16 NOTE — Telephone Encounter (Signed)
Attempted to reach Leonard [name]. Phone is unable to accept calls at this time. I will reach out to HF clinic due to inability to reach Leonard [name] for >3 weeks now. I will continue to follow.

## 2021-10-20 ENCOUNTER — Telehealth (HOSPITAL_COMMUNITY): Payer: Self-pay | Admitting: Licensed Clinical Social Worker

## 2021-10-20 NOTE — Telephone Encounter (Signed)
CSW attempted to make contact with pt to discuss continued involvement with [employer].  Phone number not accepting calls at this time so also sent a text message.  If unable to reach pt will discuss DC from program at [facility] on Wednesday.  Jorge Ny, LCSW Clinical Social Worker [facility] Desk#: [phone] Cell#: [phone]

## 2021-10-23 ENCOUNTER — Telehealth (HOSPITAL_COMMUNITY): Payer: Self-pay | Admitting: Licensed Clinical Social Worker

## 2021-10-23 NOTE — Telephone Encounter (Signed)
Pt has not returned calls or text messages after attempts over the course of a month from [place] and Clinical biochemist.  Pt discussed in [facility] yesterday and will be DC'd from program at this time for lack of communication. ? ?Jorge Ny, LCSW ?Clinical Social Worker ?[facility] ?Desk#: [phone] ?Cell#: [phone] ? ?

## 2021-12-10 ENCOUNTER — Encounter (HOSPITAL_COMMUNITY): Payer: Medicaid Other | Admitting: Cardiology

## 2021-12-10 ENCOUNTER — Ambulatory Visit (HOSPITAL_COMMUNITY): Admission: RE | Admit: 2021-12-10 | Payer: Medicaid Other | Source: Ambulatory Visit
